# Patient Record
Sex: Female | Born: 1955
Health system: Southern US, Community
[De-identification: ages and names within clinical notes are randomized; demographics above are authoritative.]

## PROBLEM LIST (undated history)

## (undated) DIAGNOSIS — E785 Hyperlipidemia, unspecified: Secondary | ICD-10-CM

## (undated) DIAGNOSIS — R7303 Prediabetes: Secondary | ICD-10-CM

## (undated) DIAGNOSIS — E042 Nontoxic multinodular goiter: Secondary | ICD-10-CM

## (undated) DIAGNOSIS — F409 Phobic anxiety disorder, unspecified: Secondary | ICD-10-CM

## (undated) DIAGNOSIS — F17210 Nicotine dependence, cigarettes, uncomplicated: Secondary | ICD-10-CM

## (undated) DIAGNOSIS — I10 Essential (primary) hypertension: Secondary | ICD-10-CM

## (undated) HISTORY — DX: Nontoxic multinodular goiter: E04.2

## (undated) HISTORY — DX: Hyperlipidemia, unspecified: E78.5

## (undated) HISTORY — DX: Prediabetes: R73.03

## (undated) HISTORY — PX: COLON SURGERY: SHX602

## (undated) HISTORY — DX: Nicotine dependence, cigarettes, uncomplicated: F17.210

## (undated) HISTORY — PX: OTHER SURGICAL HISTORY: SHX169

---
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
## 1990-12-21 HISTORY — PX: TUBAL LIGATION: SHX77

## 1999-09-26 ENCOUNTER — Encounter: Payer: Self-pay | Admitting: Family Medicine

## 2000-02-27 ENCOUNTER — Ambulatory Visit (HOSPITAL_COMMUNITY): Admission: RE | Admit: 2000-02-27 | Discharge: 2000-02-27 | Payer: Self-pay | Admitting: Obstetrics and Gynecology

## 2000-02-27 ENCOUNTER — Encounter: Payer: Self-pay | Admitting: Obstetrics and Gynecology

## 2000-05-10 ENCOUNTER — Other Ambulatory Visit: Admission: RE | Admit: 2000-05-10 | Discharge: 2000-05-10 | Payer: Self-pay | Admitting: Obstetrics and Gynecology

## 2001-05-25 ENCOUNTER — Other Ambulatory Visit: Admission: RE | Admit: 2001-05-25 | Discharge: 2001-05-25 | Payer: Self-pay | Admitting: Obstetrics and Gynecology

## 2001-06-02 ENCOUNTER — Encounter: Payer: Self-pay | Admitting: Obstetrics and Gynecology

## 2001-06-02 ENCOUNTER — Ambulatory Visit (HOSPITAL_COMMUNITY): Admission: RE | Admit: 2001-06-02 | Discharge: 2001-06-02 | Payer: Self-pay | Admitting: Obstetrics and Gynecology

## 2002-07-14 ENCOUNTER — Other Ambulatory Visit: Admission: RE | Admit: 2002-07-14 | Discharge: 2002-07-14 | Payer: Self-pay | Admitting: Obstetrics and Gynecology

## 2004-07-21 ENCOUNTER — Other Ambulatory Visit: Admission: RE | Admit: 2004-07-21 | Discharge: 2004-07-21 | Payer: Self-pay | Admitting: Obstetrics and Gynecology

## 2004-08-22 ENCOUNTER — Encounter (INDEPENDENT_AMBULATORY_CARE_PROVIDER_SITE_OTHER): Payer: Self-pay | Admitting: Specialist

## 2004-08-22 ENCOUNTER — Ambulatory Visit (HOSPITAL_COMMUNITY): Admission: RE | Admit: 2004-08-22 | Discharge: 2004-08-22 | Payer: Self-pay | Admitting: Obstetrics and Gynecology

## 2004-12-21 HISTORY — PX: DILATION AND CURETTAGE OF UTERUS: SHX78

## 2005-07-13 ENCOUNTER — Other Ambulatory Visit: Admission: RE | Admit: 2005-07-13 | Discharge: 2005-07-13 | Payer: Self-pay | Admitting: Obstetrics and Gynecology

## 2005-07-21 ENCOUNTER — Ambulatory Visit (HOSPITAL_COMMUNITY): Admission: RE | Admit: 2005-07-21 | Discharge: 2005-07-21 | Payer: Self-pay | Admitting: Obstetrics and Gynecology

## 2010-02-04 ENCOUNTER — Encounter: Payer: Self-pay | Admitting: Physician Assistant

## 2010-08-01 ENCOUNTER — Encounter: Payer: Self-pay | Admitting: Physician Assistant

## 2010-08-01 ENCOUNTER — Telehealth: Payer: Self-pay | Admitting: Family Medicine

## 2010-08-01 ENCOUNTER — Ambulatory Visit: Payer: Self-pay | Admitting: Family Medicine

## 2010-08-01 DIAGNOSIS — N959 Unspecified menopausal and perimenopausal disorder: Secondary | ICD-10-CM | POA: Insufficient documentation

## 2010-08-01 DIAGNOSIS — E041 Nontoxic single thyroid nodule: Secondary | ICD-10-CM | POA: Insufficient documentation

## 2010-08-01 DIAGNOSIS — F17200 Nicotine dependence, unspecified, uncomplicated: Secondary | ICD-10-CM | POA: Insufficient documentation

## 2010-08-04 ENCOUNTER — Telehealth: Payer: Self-pay | Admitting: Physician Assistant

## 2010-08-08 ENCOUNTER — Ambulatory Visit (HOSPITAL_COMMUNITY): Admission: RE | Admit: 2010-08-08 | Discharge: 2010-08-08 | Payer: Self-pay | Admitting: Family Medicine

## 2010-08-11 ENCOUNTER — Ambulatory Visit (HOSPITAL_COMMUNITY): Admission: RE | Admit: 2010-08-11 | Discharge: 2010-08-11 | Payer: Self-pay | Admitting: Family Medicine

## 2010-08-11 LAB — CONVERTED CEMR LAB
Albumin: 4.4 g/dL (ref 3.5–5.2)
Basophils Absolute: 0 10*3/uL (ref 0.0–0.1)
Calcium: 9.8 mg/dL (ref 8.4–10.5)
Chloride: 101 meq/L (ref 96–112)
Cholesterol: 264 mg/dL — ABNORMAL HIGH (ref 0–200)
Creatinine, Ser: 0.78 mg/dL (ref 0.40–1.20)
Eosinophils Absolute: 0 10*3/uL (ref 0.0–0.7)
HCT: 38 % (ref 36.0–46.0)
Hgb A1c MFr Bld: 6.1 % — ABNORMAL HIGH (ref <5.7)
Lymphocytes Relative: 43 % (ref 12–46)
MCHC: 32.1 g/dL (ref 30.0–36.0)
Neutrophils Relative %: 48 % (ref 43–77)
Sodium: 141 meq/L (ref 135–145)
Total CHOL/HDL Ratio: 8.8
Total Protein: 7.1 g/dL (ref 6.0–8.3)
Triglycerides: 377 mg/dL — ABNORMAL HIGH (ref <150)
WBC: 5.1 10*3/uL (ref 4.0–10.5)

## 2010-08-14 ENCOUNTER — Ambulatory Visit: Payer: Self-pay | Admitting: Otolaryngology

## 2010-08-14 ENCOUNTER — Encounter: Payer: Self-pay | Admitting: Physician Assistant

## 2010-08-19 ENCOUNTER — Ambulatory Visit (HOSPITAL_COMMUNITY): Admission: RE | Admit: 2010-08-19 | Discharge: 2010-08-19 | Payer: Self-pay | Admitting: Otolaryngology

## 2010-09-04 ENCOUNTER — Encounter: Payer: Self-pay | Admitting: Physician Assistant

## 2010-09-04 ENCOUNTER — Ambulatory Visit: Payer: Self-pay | Admitting: Otolaryngology

## 2010-09-25 ENCOUNTER — Other Ambulatory Visit: Admission: RE | Admit: 2010-09-25 | Discharge: 2010-09-25 | Payer: Self-pay | Admitting: Family Medicine

## 2010-09-25 ENCOUNTER — Ambulatory Visit: Payer: Self-pay | Admitting: Family Medicine

## 2010-09-25 DIAGNOSIS — E785 Hyperlipidemia, unspecified: Secondary | ICD-10-CM | POA: Insufficient documentation

## 2010-09-25 DIAGNOSIS — R7303 Prediabetes: Secondary | ICD-10-CM | POA: Insufficient documentation

## 2010-09-25 LAB — CONVERTED CEMR LAB: OCCULT 1: NEGATIVE

## 2010-10-14 ENCOUNTER — Encounter: Payer: Self-pay | Admitting: Physician Assistant

## 2011-01-22 NOTE — Progress Notes (Signed)
Summary: TEST  Phone Note Call from Patient   Summary of Call: WANTS TO DO BOTH TEST THE SAME DAY IN THE AFTERNOON ANYTIME AFTER LUNCH Initial call taken by: Lind Guest,  August 01, 2010 10:19 AM  Follow-up for Phone Call        pt has appts on 08/05/2010 1:00.  left message for pt to call back.  Follow-up by: Rudene Anda,  August 01, 2010 3:57 PM

## 2011-01-22 NOTE — Letter (Signed)
Summary: DR. Suszanne Conners  DR. TEOH   Imported By: Lind Guest 08/20/2010 11:22:14  _____________________________________________________________________  External Attachment:    Type:   Image     Comment:   External Document

## 2011-01-22 NOTE — Assessment & Plan Note (Signed)
Summary: PHY   Vital Signs:  Patient profile:   55 year old female Menstrual status:  perimenopausal Height:      63.5 inches Weight:      142.50 pounds BMI:     24.94 O2 Sat:      99 % Pulse rate:   71 / minute Resp:     16 per minute BP sitting:   122 / 68  (left arm) Cuff size:   regular  Vitals Entered By: Mauricia Area CMA CC: CPE   CC:  CPE.  History of Present Illness: Pt is being seen today for a physical.  Last physical 5-6 yrs ago. Overall feeling well and no current complaints or concers. Perimenopausal. Last Mamm 8-11.  + SBEs Colonoscopy - no prev. Last eye exam approx 2 yrs ago. + routine dental care Last TD uncertain. Pt declines flu vaccine.  Labs 08-06-10 showed Hyperlipidemia, and Pre-diabetes. Due for f/u labs next mos.    Allergies: No Known Drug Allergies  Past History:  Past medical, surgical, family and social histories (including risk factors) reviewed, and no changes noted (except as noted below).  Past Medical History: Benign Rt thyroid nodule  Past Surgical History: D&C 2006 Tubal ligation 1992 FNA thyroid 2011 - benign  Family History: Reviewed history from 08/01/2010 and no changes required. Mother living-HTN, CAD, Hyperlipidemia, AAA Father living- HTN One sister living- presumed healthy  Social History: Reviewed history from 08/01/2010 and no changes required. 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 Eyes:  Denies blurring and double vision. ENT:  Denies decreased hearing, earache, nasal congestion, postnasal drainage, ringing in ears, sinus pressure, and sore throat. CV:  Denies chest pain or discomfort, lightheadness, palpitations, and swelling of feet. Resp:  Denies cough and shortness of breath. GI:  Denies abdominal pain, bloody stools,  change in bowel habits, constipation, dark tarry stools, diarrhea, indigestion, nausea, and vomiting. GU:  Denies abnormal vaginal bleeding, discharge, dysuria, incontinence, and urinary frequency. MS:  Complains of joint pain; denies joint redness, joint swelling, low back pain, and mid back pain; BILAT KNEE PAIN X YRS, UNCHANGED. Derm:  Denies rash. Neuro:  Denies headaches, numbness, and tingling. Psych:  Denies anxiety and depression.  Physical Exam  General:  Well-developed,well-nourished,in no acute distress; alert,appropriate and cooperative throughout examination Head:  Normocephalic and atraumatic without obvious abnormalities. No apparent alopecia or balding. Eyes:  pupils equal, pupils round, and pupils reactive to light.   Ears:  External ear exam shows no significant lesions or deformities.  Otoscopic examination reveals clear canals, tympanic membranes are intact bilaterally without bulging, retraction, inflammation or discharge. Hearing is grossly normal bilaterally. Nose:  External nasal examination shows no deformity or inflammation. Nasal mucosa are pink and moist without lesions or exudates. Mouth:  Oral mucosa and oropharynx without lesions or exudates.  Teeth in good repair. Neck:  No deformities, masses, or tenderness noted.  Rt thyroid nodule, nontender Chest Wall:  no deformities and no mass.   Breasts:  No mass, nodules, thickening, tenderness, bulging, retraction, inflamation, nipple discharge or skin changes noted.   Lungs:  Normal respiratory effort, chest expands symmetrically. Lungs are clear to auscultation, no crackles or wheezes. Heart:  Normal rate and regular rhythm. S1 and S2 normal without gallop, murmur, click, rub or other extra sounds. Abdomen:  Bowel sounds positive,abdomen soft and  non-tender without masses, organomegaly or hernias noted. Rectal:  No external abnormalities noted. Normal sphincter tone. No rectal masses or tenderness. Genitalia:  Normal  introitus for age, no external lesions, no vaginal discharge, mucosa pink and moist, no vaginal or cervical lesions, no vaginal atrophy, no friaility or hemorrhage, normal uterus size and position, no adnexal masses or tenderness Pulses:  R posterior tibial normal, R dorsalis pedis normal, L posterior tibial normal, and L dorsalis pedis normal.   Extremities:  No clubbing, cyanosis, edema, or deformity noted with normal full range of motion of all joints.   Neurologic:  alert & oriented X3, sensation intact to light touch, gait normal, and DTRs symmetrical and normal.   Skin:  Intact without suspicious lesions or rashes Cervical Nodes:  No lymphadenopathy noted Axillary Nodes:  No palpable lymphadenopathy Psych:  Cognition and judgment appear intact. Alert and cooperative with normal attention span and concentration. No apparent delusions, illusions, hallucinations   Impression & Recommendations:  Problem # 1:  PREVENTIVE HEALTH CARE (ICD-V70.0) Assessment Comment Only Pt declined referral for screening colonoscopy.  Problem # 2:  PRE-DIABETES (ICD-790.29) Assessment: Comment Only  Orders: T- Hemoglobin A1C ([id number]) T-Comprehensive Metabolic Panel ([id number])  Labs Reviewed: Creat: 0.78 (08/06/2010)     Problem # 3:  HYPERLIPIDEMIA (ICD-272.4) Assessment: Comment Only  Orders: T-Lipid Profile [id number]) T-Comprehensive Metabolic Panel [phone])  Labs Reviewed: SGOT: 21 (08/06/2010)   SGPT: 16 (08/06/2010)   HDL:30 (08/06/2010)  LDL:159 (08/06/2010)  Chol:264 (08/06/2010)  Trig:377 (08/06/2010)  Problem # 4:  THYROID NODULE, RIGHT (ICD-241.0) Assessment: Comment Only  Problem # 5:  PERIMENOPAUSAL SYNDROME (ICD-627.9) Assessment: Comment Only  Other Orders: Pap Smear (28413) Hemoccult Guaiac-1 spec.(in office) (82270) Tdap => 71yrs IM (24401) Admin 1st Vaccine (02725) Admin 1st Vaccine [facility]) [id number])  Patient Instructions: 1)  Please schedule a  follow-up appointment in 4 months. 2)  Have blood work drawn fasting in mid November. 3)  You have received a Tetnus vaccine today (Tdap). 4)  Tobacco is very bad for your health and your loved ones! You Should stop smoking!. 5)  Stop Smoking Tips: Choose a Quit date. Cut down before the Quit date. decide what you will do as a substitute when you feel the urge to smoke(gum,toothpick,exercise). 6)  If you change your mind about a referral for a screening colonscopy please notify the office.   Laboratory Results    Stool - Occult Blood Hemmoccult #1: negative Date: 09/25/2010     Tetanus/Td Vaccine    Vaccine Type: Tdap    Site: left deltoid    Mfr: boostrix    Dose: 0.5 ml    Route: IM    Given by: Everitt Amber LPN    Exp. Date: 10/09/2012    Lot #: [id number]

## 2011-01-22 NOTE — Assessment & Plan Note (Signed)
Summary: new patient- room 2   Vital Signs:  Patient profile:   55 year old female Menstrual status:  perimenopausal Height:      63.5 inches Weight:      140.75 pounds BMI:     24.63 O2 Sat:      100 % on Room air Pulse rate:   83 / minute Resp:     16 per minute BP sitting:   140 / 78  (left arm)  Vitals Entered By: Adella Hare LPN (August 01, 2010 9:37 AM) CC: new patient Is Patient Diabetic? No Pain Assessment Patient in pain? no          Menstrual Status perimenopausal   CC:  new patient.  History of Present Illness: New pt here to establish care with new PCP.  No complaints or concerns. Is a smoker and knows she should quit, but is not ready or interested to at this time.  Has been going thru menopause x 2 yrs.  Her last nl menses was approx 1 yr ago but has light spotting every few mos still.  Has been having hot flashes x approx 2 yrs.  Sometimes worse than others.  Last physical/pap 5-6 yrs ago.  Mamm too. No prev colonoscopy. Labs done thru employer this last spring.  Pt has copy at home.         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 CV:  Denies chest pain or discomfort, lightheadness, and palpitations. Resp:  Denies cough and shortness of breath. GI:  Denies abdominal pain, change in bowel habits, indigestion, loss of appetite, nausea, and vomiting. GU:  Complains of  abnormal vaginal bleeding and incontinence; denies dysuria and urinary frequency; SUI. Psych:  Denies anxiety and depression.  Physical Exam  General:  Well-developed,well-nourished,in no acute distress; alert,appropriate and cooperative throughout examination Head:  Normocephalic and atraumatic without obvious abnormalities. No apparent alopecia or balding. Ears:  External ear exam shows no significant lesions or deformities.  Otoscopic examination reveals clear canals, tympanic membranes are intact bilaterally without bulging, retraction, inflammation or discharge. Hearing is grossly normal bilaterally. Nose:  External nasal examination shows no deformity or inflammation. Nasal mucosa are pink and moist without lesions or exudates. Mouth:  Oral mucosa and oropharynx without lesions or exudates.  Teeth in good repair. Neck:  No deformities, masses, or tenderness noted.  Palp approx 1 cm nodule Rt lobe of thyroid.  Nontender Lungs:  Normal respiratory effort, chest expands symmetrically. Lungs are clear to auscultation, no crackles or wheezes. Heart:  Normal rate and regular rhythm. S1 and S2 normal without gallop, murmur, click, rub or other extra sounds. Cervical Nodes:  No lymphadenopathy noted Psych:  Cognition and judgment appear intact. Alert and cooperative with normal attention span and concentration. No apparent delusions, illusions, hallucinations   Impression & Recommendations:  Problem # 1:  PERIMENOPAUSAL SYNDROME (ICD-627.9) Assessment Comment Only Discussed measures to help with hot flashes.  Briefly discussed HRT and risks. Pt not interested  in rxs anyway.  Problem # 2:  THYROID NODULE, RIGHT (ICD-241.0) Assessment: New  Orders: T-TSH ([id number]) T-T4, Free [phone]) Miscellaneous Other Radiology (Misc Other Rad)  Problem # 3:  NICOTINE ADDICTION (ICD-305.1) Assessment: Comment Only Pt aware of risks.  Is not interested in quitting at this time.  Other  Orders: T-Mammography Bilateral Screening (30865)  Patient Instructions: 1)  Schedule physical/pap in 1-2 mos. 2)  I have ordered an ultrasound of your thyroid gland and lab work. 3)  I have ordered a mammogram for you. 4)  Please fax or drop off a copy of your recent lab work. 5)  Tobacco is very bad for your health and your loved ones! You Should stop smoking!. 6)  Stop Smoking Tips: Choose a Quit date. Cut down before the Quit date. decide what you will do as a substitute when you feel the urge to smoke(gum,toothpick,exercise).

## 2011-01-22 NOTE — Progress Notes (Signed)
  Phone Note Outgoing Call   Summary of Call: Advise pt that I reviewed the lab results she faxed me.  Her cholesterol levess were quite high, and her blood sugar was also mildly elevated.  I need to have these repeated.  Her thyroid labs were nl she had done last week. Order CBC, Comp panel, Lipids, HgbA1C. Dx:  Impaired fasting glucose, Hyperlipidemia, Diaphoresis for the CBC Initial call taken by: Esperanza Sheets PA,  August 04, 2010 8:38 AM  Follow-up for Phone Call        patient aware, order sent to lab Follow-up by: Adella Hare LPN,  August 04, 2010 11:56 AM

## 2011-01-23 NOTE — Letter (Signed)
Summary: Letter  Letter   Imported By: Lind Guest 10/15/2010 14:30:54  _____________________________________________________________________  External Attachment:    Type:   Image     Comment:   External Document

## 2011-01-23 NOTE — Letter (Signed)
Summary: ENT  ENT   Imported By: Lind Guest 09/09/2010 08:35:04  _____________________________________________________________________  External Attachment:    Type:   Image     Comment:   External Document

## 2011-01-26 ENCOUNTER — Ambulatory Visit (INDEPENDENT_AMBULATORY_CARE_PROVIDER_SITE_OTHER): Payer: 59 | Admitting: Family Medicine

## 2011-01-26 ENCOUNTER — Encounter: Payer: Self-pay | Admitting: Family Medicine

## 2011-01-26 DIAGNOSIS — E785 Hyperlipidemia, unspecified: Secondary | ICD-10-CM

## 2011-01-26 DIAGNOSIS — E041 Nontoxic single thyroid nodule: Secondary | ICD-10-CM

## 2011-01-26 DIAGNOSIS — R7301 Impaired fasting glucose: Secondary | ICD-10-CM | POA: Insufficient documentation

## 2011-01-26 DIAGNOSIS — F17200 Nicotine dependence, unspecified, uncomplicated: Secondary | ICD-10-CM

## 2011-01-27 DIAGNOSIS — E663 Overweight: Secondary | ICD-10-CM | POA: Insufficient documentation

## 2011-01-29 LAB — CONVERTED CEMR LAB
Cholesterol: 249 mg/dL — ABNORMAL HIGH (ref 0–200)
Glucose, Bld: 106 mg/dL — ABNORMAL HIGH (ref 70–99)
LDL Cholesterol: 181 mg/dL — ABNORMAL HIGH (ref 0–99)
Total CHOL/HDL Ratio: 8.3

## 2011-01-30 ENCOUNTER — Encounter: Payer: Self-pay | Admitting: Family Medicine

## 2011-02-05 NOTE — Letter (Signed)
Summary: gynecologic cytology report  gynecologic cytology report   Imported By: Lind Guest 01/30/2011 15:02:55  _____________________________________________________________________  External Attachment:    Type:   Image     Comment:   External Document

## 2011-02-05 NOTE — Letter (Signed)
Summary: rewards for health program  rewards for health program   Imported By: Lind Guest 01/30/2011 15:01:33  _____________________________________________________________________  External Attachment:    Type:   Image     Comment:   External Document

## 2011-02-05 NOTE — Assessment & Plan Note (Signed)
Summary: office visit   Vital Signs:  Patient profile:   55 year old female Menstrual status:  perimenopausal Height:      63.5 inches Weight:      137 pounds BMI:     23.97 O2 Sat:      99 % Pulse rate:   80 / minute Pulse rhythm:   regular Resp:     16 per minute BP sitting:   118 / 80  (left arm) Cuff size:   regular  Vitals Entered By: Everitt Amber LPN (January 26, 2011 3:50 PM) 127CC: Follow up chronic problems   Primary Care Provider:  Kerri Perches, MD  CC:  Follow up chronic problems.  History of Present Illness: Reports  thatshe is well. She has paperwork from her insurance company to be completed. She is refusing a colonscopy at this timer. She states her ciggaretes are for stress relief primarily, however she is willing to slowly cut back. Denies recent fever or chills. Denies sinus pressure, nasal congestion , ear pain or sore throat. Denies chest congestion, or cough productive of sputum. Denies chest pain, palpitations, PND, orthopnea or leg swelling. Denies abdominal pain, nausea, vomitting, diarrhea or constipation. Denies change in bowel movements or bloody stool. Denies dysuria , frequency, incontinence or hesitancy. Denies  joint pain, swelling, or reduced mobility. Denies headaches, vertigo, seizures. Denies depression, anxiety or insomnia. Denies  rash, lesions, or itch. Exercises on avg 5 days per week with her dogs, enjoys it and it affords stress relief. She has altered her diet and lost weight.     Preventive Screening-Counseling & Management  Alcohol-Tobacco     Smoking Cessation Counseling: yes  Current Medications (verified): 1)  None  Allergies (verified): No Known Drug Allergies  Past History:  Past medical, surgical, family and social histories (including risk factors) reviewed for relevance to current acute and chronic problems.  Past Medical History: Benign Rt thyroid nodule Prediabetes hyperlipidemia  Past  Surgical History: Reviewed history from 09/25/2010 and no changes required. D&C 2006 Tubal ligation 1992 FNA thyroid 2011 - benign  Family History: Reviewed history from 09/25/2010 and no changes required. Mother living-HTN, CAD, Hyperlipidemia, AAA Father living- HTN One sister living- presumed healthy  Social History: Reviewed history from 08/01/2010 and no changes required. 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 Psych:  Complains of anxiety; stress, esp the job. Endo:  Denies cold intolerance, excessive hunger, excessive thirst, and excessive urination; from last labs pt is prediabetic. Heme:  Denies abnormal bruising and bleeding. Allergy:  Denies hives or rash, itching eyes, and persistent infections.  Physical Exam  General:  Well-developed,well-nourished,in no acute distress; alert,appropriate and cooperative throughout examination HEENT: No facial asymmetry,  EOMI, No sinus tenderness, oropharynx  pink and moist.   Chest: Clear to auscultation bilaterally.  CVS: S1, S2, No murmurs, No S3.   Abd: Soft, Nontender.  MS: Adequate ROM spine, hips, shoulders and knees.  Ext: No edema.   CNS: CN 2-12 intact, power tone and sensation normal throughout.   Skin: Intact, no visible lesions or rashes.  Psych: Good eye contact, normal affect.  Memory intact, not anxious or depressed appearing.    Impression & Recommendations:  Problem # 1:  IMPAIRED FASTING GLUCOSE (ICD-790.21)  Assessment Comment Only  Orders: T- Hemoglobin A1C ([id number]) T-Glucose, Blood (30865-78469) T- Hemoglobin A1C (62952-84132) Pt advised to reduce carbohydrate intake, espescially sweets, and to start regular physical activity, at least 30 minutes 5 days weekly, to  enable weight loss, and reduce the risk of becoming diabetic   Problem # 2:  HYPERLIPIDEMIA (ICD-272.4) Assessment: Comment Only  Orders: T-Lipid Profile ([id number])  Labs Reviewed: SGOT: 21 (08/06/2010)   SGPT: 16 (08/06/2010)   HDL:30 (08/06/2010)  LDL:159 (08/06/2010)  Chol:264 (08/06/2010)  Trig:377 (08/06/2010) Low fat diet discussed and encouraged, and literature also given  Problem # 3:  NICOTINE ADDICTION (ICD-305.1) Assessment: Unchanged  Encouraged smoking cessation and discussed different methods for smoking cessation. Current 1PPd  Problem # 4:  OVERWEIGHT (ICD-278.02) Assessment: Improved  Ht: 63.5 (01/26/2011)   Wt: 137 (01/26/2011)   BMI: 23.97 (01/26/2011) therapeutic lifestyle change discussed and encouraged, pt applauded on weight loss  Patient Instructions: 1)  Follow up appointment in 5.24months 2)  Tobacco is very bad for your health and your loved ones! You Should stop smoking!. 3)  Stop Smoking Tips: Choose a Quit date. Cut down before the Quit date. decide what you will do as a substitute when you feel the urge to smoke(gum,toothpick,exercise). 4)  It is important that you exercise regularly at least 40 minutes 7 times a week. If you develop chest pain, have severe difficulty breathing, or feel very tired , stop exercising immediately and seek medical attention. 5)  You need to lose weight. Consider a lower calorie diet and regular exercise.  6)  Lipid Panel prior to visit, ICD-9:  fasting asap 7)  HbgA1C prior to visit, ICD-9: 8)  Fasting blood sugar 9)  Lipid Panel prior to visit, ICD-9: 10)  HbgA1C prior to visit, ICD-9:  fasting in 5.5 months 11)  Schedule a colonoscopy/sigmoidoscopy to help detect colon cancer.   Orders Added: 1)  Est. Patient Level IV [66440] 2)  T-Lipid Profile [80061-22930] 3)  T- Hemoglobin A1C [83036-23375] 4)  T-Glucose, Blood [82947-23040] 5)  T-Lipid Profile [80061-22930] 6)  T- Hemoglobin A1C [83036-23375]

## 2011-05-08 NOTE — H&P (Signed)
NAME:  Andrea Simon, Andrea Simon NO.:  [id number]   MEDICAL RECORD NO.:  [id number]                   PATIENT TYPE:  AMB   LOCATION:  [facility]                                  FACILITY:  [facility]   PHYSICIAN:  Janine Limbo, M.D.            DATE OF BIRTH:  01-31-1956   DATE OF ADMISSION:  08/22/2004  DATE OF DISCHARGE:                                HISTORY & PHYSICAL   HISTORY OF PRESENT ILLNESS:  [name] is a 55 year old female, para 1-0-  0-1, who presents for hysteroscopy with dilatation and curettage because of  irregular cycles.  A hydrosonogram was performed and the patient was found  to have an endometrial polyp or an endometrial fibroid.   OBSTETRICAL HISTORY:  The patient has had 1 term vaginal delivery.   PAST MEDICAL HISTORY:  The patient has had a bilateral tubal ligation.  The  patient denies hypertension and diabetes.   DRUG ALLERGIES:  The patient reports that she is allergic to CODEINE and  this causes nausea.   SOCIAL HISTORY:  The patient smokes 1 pack of cigarettes each day.  She  denied alcohol use and other recreational drug uses.   REVIEW OF SYSTEMS:  Review of systems noncontributory.   FAMILY HISTORY:  Family history is noncontributory.   PHYSICAL EXAMINATION:  GENERAL:  Weight is 119 pounds.  HEENT:  Within normal limits.  CHEST:  Chest is clear.  HEART:  Regular rate and rhythm.  BREASTS:  Her breasts are without masses.  ABDOMEN:  Her abdomen is soft and nontender.  EXTREMITIES:  Extremities within normal limits.  NEUROLOGIC:  Exam is grossly normal.  PELVIC:  External genitalia is normal.  Vagina is normal.  Cervix is  nontender.  Uterus is 8 weeks' size and slightly irregular.  Adnexa:  No  masses.  Rectovaginal exam confirms.   LABORATORY VALUES:  Ultrasound shows a 10.3 x 6.6-cm uterus.  The  endometrium measures 1.22 cm.  The ovaries appear normal.  The patient was  found to have a 3.9-cm lesion within the endometrial  cavity.   GC negative.  Chlamydia negative.  Hemoglobin 6.7.  TSH 1.98.  FSH 107.9.  Prolactin is 12.4.   ASSESSMENT:  1.  Endometrial polyp.  2.  Irregular bleeding.  3.  Severe anemia.   PLAN:  The patient will undergo hysteroscopy with resection of the  intrauterine polyp (fibroid).  She understands the indications for her  procedure and she accepts the associated risks.                                               Janine Limbo, M.D.    AVS/MEDQ  D:  08/21/2004  T:  08/21/2004  Job:  [id number]

## 2011-05-08 NOTE — Op Note (Signed)
NAME:  Andrea Simon, Andrea Simon                    ACCOUNT NO.:  [id number]   MEDICAL RECORD NO.:  [id number]                   PATIENT TYPE:  AMB   LOCATION:  SDC                                  FACILITY:  [facility]   PHYSICIAN:  Janine Limbo, M.D.            DATE OF BIRTH:  1956-09-27   DATE OF PROCEDURE:  08/22/2004  DATE OF DISCHARGE:                                 OPERATIVE REPORT   PREOPERATIVE DIAGNOSES:  1.  Irregular uterine bleeding.  2.  Fibroid uterus.  3.  Endometrial polyps.  4.  Anemia (hemoglobin 7.4).   POSTOPERATIVE DIAGNOSES:  1.  Irregular uterine bleeding.  2.  Fibroid uterus.  3.  Endometrial polyps.  4.  Anemia (hemoglobin 7.4).   PROCEDURE:  1.  Hysteroscopy with resection of fibroids and resection of polyps.  2.  Dilatation and curettage.   SURGEON:  Dr. Leonard Schwartz.   ANESTHETIC:  Monitored anesthetic control and paracervical block using 0.5%  Marcaine with epinephrine.   DISPOSITION:  [name] is a 55 year old female para 1-0-0-1 who  presents with the above-mentioned diagnosis.  The patient understands the  indications for her procedure and she accepts the associated risks of, but  not limited to, anesthetic complications, bleeding, infections, and possible  damage to the surrounding organs.   FINDINGS:  The patient was noted to have a 12-week size multifibroid uterus.  The uterus sounded to 11 cm.  The patient was found to have a 3 cm  endometrial polyp from the left fundus of the uterus.  There was a 2 cm  submucosal fibroid from the right fundus.  There were no areas within the  endometrial cavity that appeared malignant.  No adnexal masses were  appreciated on examination under anesthesia.   PROCEDURE:  The patient was taken to the operating room where she was given  medication through her IV line.  [name] abdomen, perineum, and vagina  were prepped with multiple layers of Betadine.  The bladder was drained of  urine.  The patient was sterilely draped.  Examination under anesthesia was  performed.  A paracervical block was placed using 10 mL of 0.5% Marcaine  with epinephrine.  An endocervical curettage was then obtained.  The uterus  sounded to 11 cm.  The cervix was dilated.  The diagnostic hysteroscope was  inserted and findings are noted above.  Pictures were taken of the patient's  endometrial cavity.  The hysteroscope was removed and the cervix was dilated  further.  The operative hysteroscope was then inserted.  A single loop  resection apparatus was then used to resect the endometrial polyp from the  left upper uterus.  We then used the same apparatus to slowly resect the  submucosal fibroid from the right fundus.  Care was taken not to dissect too  deeply within the endometrium.  We then removed the hysteroscope with the  resection apparatus and the cavity was explored using Brynda Greathouse  stone forceps.  The cavity was then gently curetted until the cavity was felt to be  completely clean.  The hysteroscope was then reinserted and the cavity was  indeed felt to be clean.  Another picture was taken.  All instruments were  then removed.  Hemostasis was noted to be adequate.  The estimated blood  loss was 10 mL.  The estimated fluid deficit was 110 mL.  The examination  was repeated and the uterus was noted to be firm.  The patient was then  returned to the supine position and taken to the recovery room in stable  condition.  Sponge, needle, and instrument counts were correct on two  occasions.  The patient tolerated her procedure well.   FOLLOW-UP INSTRUCTIONS:  The patient was given a prescription for Darvocet-N  50 and she will take one or two tablets q.4h. as needed for pain.  She will  return to see Dr. Stefano Gaul in 2-3 weeks for follow-up examination.  She was  given a copy of the postoperative instruction sheet as prepared by the  [facility] for patients who have  undergone dilatation  and curettage.                                               Janine Limbo, M.D.    AVS/MEDQ  D:  08/22/2004  T:  08/23/2004  Job:  [id number]

## 2011-07-06 ENCOUNTER — Encounter: Payer: Self-pay | Admitting: Physician Assistant

## 2011-07-13 ENCOUNTER — Other Ambulatory Visit: Payer: Self-pay | Admitting: Family Medicine

## 2011-07-14 ENCOUNTER — Encounter: Payer: Self-pay | Admitting: Family Medicine

## 2011-07-14 ENCOUNTER — Ambulatory Visit (INDEPENDENT_AMBULATORY_CARE_PROVIDER_SITE_OTHER): Payer: 59 | Admitting: Family Medicine

## 2011-07-14 VITALS — BP 130/80 | HR 72 | Resp 16 | Ht 63.5 in | Wt 130.0 lb

## 2011-07-14 DIAGNOSIS — R5381 Other malaise: Secondary | ICD-10-CM

## 2011-07-14 DIAGNOSIS — R5383 Other fatigue: Secondary | ICD-10-CM

## 2011-07-14 DIAGNOSIS — E785 Hyperlipidemia, unspecified: Secondary | ICD-10-CM

## 2011-07-14 DIAGNOSIS — R7309 Other abnormal glucose: Secondary | ICD-10-CM

## 2011-07-14 DIAGNOSIS — F17200 Nicotine dependence, unspecified, uncomplicated: Secondary | ICD-10-CM

## 2011-07-14 LAB — LIPID PANEL
HDL: 36 mg/dL — ABNORMAL LOW (ref >39)
LDL Cholesterol: 165 mg/dL — ABNORMAL HIGH (ref 0–99)
Total CHOL/HDL Ratio: 6.8 Ratio
Triglycerides: 214 mg/dL — ABNORMAL HIGH (ref <150)

## 2011-07-14 LAB — HEMOGLOBIN A1C: Mean Plasma Glucose: 140 mg/dL — ABNORMAL HIGH (ref <117)

## 2011-07-14 NOTE — Patient Instructions (Addendum)
  F/U in 4 months.  Fasting lipid, hepatic, chem 7, hBAC  In 4 months   You will start med for high cholesterol once I check your liver enz  You are being referred for individual teaching for diabetes  pls commit to regular daily exercise , this helps blood sugar and cholesterol.  Pls stop drinking sweetened drinks

## 2011-07-15 LAB — HEPATIC FUNCTION PANEL
AST: 17 U/L (ref 0–37)
Indirect Bilirubin: 0.7 mg/dL (ref 0.0–0.9)
Total Bilirubin: 0.8 mg/dL (ref 0.3–1.2)

## 2011-07-15 LAB — BASIC METABOLIC PANEL
Chloride: 103 mEq/L (ref 96–112)
Glucose, Bld: 103 mg/dL — ABNORMAL HIGH (ref 70–99)

## 2011-07-15 MED ORDER — ROSUVASTATIN CALCIUM 10 MG PO TABS: 10.0000 mg | ORAL_TABLET | Freq: Every day | ORAL | Status: DC

## 2011-08-02 ENCOUNTER — Encounter: Payer: Self-pay | Admitting: Family Medicine

## 2011-08-02 NOTE — Progress Notes (Signed)
  Subjective:    Patient ID: Andrea Simon, female    DOB: May 26, 1956, 55 y.o.   MRN: [id number]  HPI The PT is here for follow up and re-evaluation of chronic medical conditions, medication management and review of any available recent lab and radiology data.  Preventive health is updated, specifically  Cancer screening and Immunization.   Questions or concerns regarding consultations or procedures which the PT has had in the interim are  addressed. The PT denies any adverse reactions to current medications since the last visit.  There are no new concerns.  There are no specific complaints       Review of Systems Denies recent fever or chills. Denies sinus pressure, nasal congestion, ear pain or sore throat. Denies chest congestion, productive cough or wheezing. Denies chest pains, palpitations and leg swelling Denies abdominal pain, nausea, vomiting,diarrhea or constipation.   Denies dysuria, frequency, hesitancy or incontinence. Denies joint pain, swelling and limitation in mobility. Denies headaches, seizures, numbness, or tingling. Denies depression, anxiety or insomnia. Denies skin break down or rash.        Objective:   Physical Exam Patient alert and oriented and in no cardiopulmonary distress.  HEENT: No facial asymmetry, EOMI, no sinus tenderness,  oropharynx pink and moist.  Neck supple no adenopathy.  Chest: Clear to auscultation bilaterally.  CVS: S1, S2 no murmurs, no S3.  ABD: Soft non tender. Bowel sounds normal.  Ext: No edema  MS: Adequate ROM spine, shoulders, hips and knees.  Skin: Intact, no ulcerations or rash noted.  Psych: Good eye contact, normal affect. Memory intact not anxious or depressed appearing.  CNS: CN 2-12 intact, power, tone and sensation normal throughout.        Assessment & Plan:

## 2011-08-02 NOTE — Assessment & Plan Note (Signed)
Uncontrolled, med adherence stressed and low fat diet

## 2011-08-02 NOTE — Assessment & Plan Note (Signed)
Deterioration in hBA1C to diabetic level, aggressive and consistent lifestyle change needed this is discussed with the pt

## 2011-08-02 NOTE — Assessment & Plan Note (Signed)
Unchnged, counseled to quit , no date set

## 2011-09-03 ENCOUNTER — Ambulatory Visit (INDEPENDENT_AMBULATORY_CARE_PROVIDER_SITE_OTHER): Payer: 59 | Admitting: Otolaryngology

## 2011-09-07 ENCOUNTER — Other Ambulatory Visit: Payer: Self-pay | Admitting: Family Medicine

## 2011-09-07 DIAGNOSIS — Z139 Encounter for screening, unspecified: Secondary | ICD-10-CM

## 2011-09-14 ENCOUNTER — Ambulatory Visit (HOSPITAL_COMMUNITY)
Admission: RE | Admit: 2011-09-14 | Discharge: 2011-09-14 | Disposition: A | Discharge status: Home / Self Care | Payer: 59 | Source: Ambulatory Visit | Attending: Family Medicine | Admitting: Family Medicine

## 2011-09-14 DIAGNOSIS — Z139 Encounter for screening, unspecified: Secondary | ICD-10-CM

## 2011-09-14 DIAGNOSIS — Z1231 Encounter for screening mammogram for malignant neoplasm of breast: Secondary | ICD-10-CM | POA: Insufficient documentation

## 2011-09-17 ENCOUNTER — Other Ambulatory Visit: Payer: Self-pay | Admitting: Family Medicine

## 2011-09-17 MED ORDER — PRAVASTATIN SODIUM 40 MG PO TABS: 40.0000 mg | ORAL_TABLET | Freq: Every evening | ORAL | Status: DC

## 2011-09-17 NOTE — Telephone Encounter (Signed)
pls change to pravstatin 40mg  1 at night , and erx #30 refill4 and let her know, d/c crestor pls also

## 2011-09-17 NOTE — Telephone Encounter (Signed)
Sent as requested.

## 2011-09-17 NOTE — Telephone Encounter (Signed)
Crestor is no longer covered. Pravastatin 10, 20, 40 and Lovastatin 10, 20 are and they are at [facility] for $4. Wants it changed to one of those if possible

## 2011-09-21 ENCOUNTER — Ambulatory Visit (INDEPENDENT_AMBULATORY_CARE_PROVIDER_SITE_OTHER): Payer: 59 | Admitting: Family Medicine

## 2011-09-21 ENCOUNTER — Ambulatory Visit (HOSPITAL_COMMUNITY)
Admission: RE | Admit: 2011-09-21 | Discharge: 2011-09-21 | Disposition: A | Discharge status: Home / Self Care | Payer: 59 | Source: Ambulatory Visit | Attending: Family Medicine | Admitting: Family Medicine

## 2011-09-21 ENCOUNTER — Encounter: Payer: Self-pay | Admitting: Family Medicine

## 2011-09-21 VITALS — BP 118/76 | HR 84 | Resp 16 | Ht 63.5 in | Wt 138.0 lb

## 2011-09-21 DIAGNOSIS — M549 Dorsalgia, unspecified: Secondary | ICD-10-CM

## 2011-09-21 DIAGNOSIS — M545 Low back pain, unspecified: Secondary | ICD-10-CM | POA: Insufficient documentation

## 2011-09-21 DIAGNOSIS — M25559 Pain in unspecified hip: Secondary | ICD-10-CM

## 2011-09-21 MED ORDER — TRAMADOL HCL 50 MG PO TABS: 50.0000 mg | ORAL_TABLET | Freq: Two times a day (BID) | ORAL | Status: DC | PRN

## 2011-09-21 MED ORDER — CYCLOBENZAPRINE HCL 10 MG PO TABS: 10.0000 mg | ORAL_TABLET | Freq: Every evening | ORAL | Status: DC | PRN

## 2011-09-21 NOTE — Progress Notes (Signed)
  Subjective:    Patient ID: Andrea Simon, female    DOB: [birth year], 55 y.o.   MRN: [id number]  HPI Left hip pain with radiation down leg, feels more like soreness in left hip and lower buttocks, feels stretching sensation in back of calf. Occ sharp pain down leg with numbness and tingling. Denies injury, no change in bowel or bladder, feels weaker on left side. Has been using NSAIDS otc.       Review of Systems     Objective:   Physical Exam GEN-NAD, alert and oriented x 3 Back- spine non tender, no spasm of paraspinals, neg SLR but pain with manipulation, pain with flexion and extension in left buttock w./radiation Hip- discomoft with IR of left hip, normal ER, mild TTP in left buttock Neuro- strength equal bilat, sensation in tact grossly, DTR symmetric bilat, antalgic gait       Assessment & Plan:

## 2011-09-21 NOTE — Patient Instructions (Signed)
Start the flexeril at bedtime Use the ultram for pain I will call the results of your x-ray

## 2011-09-22 NOTE — Assessment & Plan Note (Signed)
Obtain lumbar x-ray as this could be referred pain from back, other differentials, muscle strain, sciatica.

## 2011-09-22 NOTE — Assessment & Plan Note (Signed)
Obtain x-ray of hip Ultram prn Muscle relaxant

## 2011-11-09 ENCOUNTER — Encounter: Payer: Self-pay | Admitting: Family Medicine

## 2011-11-11 LAB — BASIC METABOLIC PANEL
Calcium: 10.1 mg/dL (ref 8.4–10.5)
Potassium: 4.1 mEq/L (ref 3.5–5.3)
Sodium: 142 mEq/L (ref 135–145)

## 2011-11-11 LAB — HEMOGLOBIN A1C
Hgb A1c MFr Bld: 5.9 % — ABNORMAL HIGH (ref <5.7)
Mean Plasma Glucose: 123 mg/dL — ABNORMAL HIGH (ref <117)

## 2011-11-11 LAB — HEPATIC FUNCTION PANEL
Bilirubin, Direct: 0.1 mg/dL (ref 0.0–0.3)
Total Bilirubin: 0.7 mg/dL (ref 0.3–1.2)

## 2011-11-11 LAB — LIPID PANEL
Cholesterol: 185 mg/dL (ref 0–200)
Total CHOL/HDL Ratio: 5.1 Ratio

## 2011-11-17 ENCOUNTER — Encounter: Payer: Self-pay | Admitting: Family Medicine

## 2011-11-17 ENCOUNTER — Ambulatory Visit (INDEPENDENT_AMBULATORY_CARE_PROVIDER_SITE_OTHER): Payer: 59 | Admitting: Family Medicine

## 2011-11-17 VITALS — BP 130/80 | HR 74 | Resp 16 | Ht 63.5 in | Wt 142.4 lb

## 2011-11-17 DIAGNOSIS — M549 Dorsalgia, unspecified: Secondary | ICD-10-CM

## 2011-11-17 DIAGNOSIS — R7309 Other abnormal glucose: Secondary | ICD-10-CM

## 2011-11-17 DIAGNOSIS — E785 Hyperlipidemia, unspecified: Secondary | ICD-10-CM

## 2011-11-17 DIAGNOSIS — R7302 Impaired glucose tolerance (oral): Secondary | ICD-10-CM

## 2011-11-17 DIAGNOSIS — F17200 Nicotine dependence, unspecified, uncomplicated: Secondary | ICD-10-CM

## 2011-11-17 MED ORDER — PREDNISONE (PAK) 5 MG PO TABS: ORAL_TABLET | ORAL | Status: DC

## 2011-11-17 MED ORDER — CYCLOBENZAPRINE HCL 10 MG PO TABS: 10.0000 mg | ORAL_TABLET | Freq: Every evening | ORAL | Status: AC | PRN

## 2011-11-17 NOTE — Progress Notes (Signed)
  Subjective:    Patient ID: Andrea Simon, female    DOB: 05-06-1956, 55 y.o.   MRN: [id number]  HPI The PT is here for follow up and re-evaluation of chronic medical conditions, medication management and review of any available recent lab and radiology data.  Preventive health is updated, specifically  Cancer screening and Immunization.   States she did not take the tramadol, made her nauseated  Continues to experience left hip and lower extremity pain and tingling, also low back pain    Review of Systems See HPI Denies recent fever or chills. Denies sinus pressure, nasal congestion, ear pain or sore throat. Denies chest congestion, productive cough or wheezing. Denies chest pains, palpitations and leg swelling Denies abdominal pain, nausea, vomiting,diarrhea or constipation.   Denies dysuria, frequency, hesitancy or incontinence. Denies headaches, seizures, numbness, or tingling. Denies depression, anxiety or insomnia. Denies skin break down or rash.        Objective:   Physical Exam Patient alert and oriented and in no cardiopulmonary distress.  HEENT: No facial asymmetry, EOMI, no sinus tenderness,  oropharynx pink and moist.  Neck supple no adenopathy.  Chest: Clear to auscultation bilaterally.  CVS: S1, S2 no murmurs, no S3.  ABD: Soft non tender. Bowel sounds normal.  Ext: No edema  MS: Adequate though reduced  ROM spine, and left  Hip, adequate in shoulders  and knees.  Skin: Intact, no ulcerations or rash noted.  Psych: Good eye contact, normal affect. Memory intact not anxious or depressed appearing.  CNS: CN 2-12 intact, power, tone and sensation normal throughout.        Assessment & Plan:

## 2011-11-17 NOTE — Patient Instructions (Signed)
CPE in 6 months.  Fasting lipid, hepatic, hBa1C in 6 months.  Congrats labs are much better.  Prednisone dose pack prescribed

## 2011-11-22 NOTE — Assessment & Plan Note (Signed)
Unchanged cessation  counselling done

## 2011-11-22 NOTE — Assessment & Plan Note (Signed)
Marked improvement  With  dietary  change in hba1c , down to 5.9, pt applauded on this

## 2011-11-22 NOTE — Assessment & Plan Note (Signed)
Improved, pt to continue current medication and follow a low fat diet

## 2011-11-22 NOTE — Assessment & Plan Note (Signed)
Unchanged, continues to have left lower extremity pain with numbness, resistant to MRI at this time, willing to try steroid dose pack

## 2012-02-24 ENCOUNTER — Other Ambulatory Visit: Payer: Self-pay | Admitting: Family Medicine

## 2012-05-10 ENCOUNTER — Encounter: Payer: 59 | Admitting: Family Medicine

## 2012-06-05 LAB — LIPID PANEL
Cholesterol: 192 mg/dL (ref 0–200)
Triglycerides: 135 mg/dL (ref <150)

## 2012-06-05 LAB — HEPATIC FUNCTION PANEL
ALT: 27 U/L (ref 0–35)
AST: 21 U/L (ref 0–37)
Albumin: 4.3 g/dL (ref 3.5–5.2)
Alkaline Phosphatase: 74 U/L (ref 39–117)
Bilirubin, Direct: 0.2 mg/dL (ref 0.0–0.3)
Indirect Bilirubin: 0.8 mg/dL (ref 0.0–0.9)

## 2012-06-05 LAB — HEMOGLOBIN A1C: Hgb A1c MFr Bld: 6 % — ABNORMAL HIGH (ref <5.7)

## 2012-06-14 ENCOUNTER — Encounter (INDEPENDENT_AMBULATORY_CARE_PROVIDER_SITE_OTHER): Payer: Self-pay | Admitting: *Deleted

## 2012-06-14 ENCOUNTER — Encounter: Payer: Self-pay | Admitting: Family Medicine

## 2012-06-14 ENCOUNTER — Ambulatory Visit (INDEPENDENT_AMBULATORY_CARE_PROVIDER_SITE_OTHER): Payer: 59 | Admitting: Family Medicine

## 2012-06-14 ENCOUNTER — Other Ambulatory Visit (HOSPITAL_COMMUNITY)
Admission: RE | Admit: 2012-06-14 | Discharge: 2012-06-14 | Disposition: A | Discharge status: Home / Self Care | Payer: 59 | Source: Ambulatory Visit | Attending: Family Medicine | Admitting: Family Medicine

## 2012-06-14 VITALS — BP 130/76 | HR 82 | Resp 18 | Ht 63.5 in | Wt 141.0 lb

## 2012-06-14 DIAGNOSIS — Z0000 Encounter for general adult medical examination without abnormal findings: Secondary | ICD-10-CM

## 2012-06-14 DIAGNOSIS — R7309 Other abnormal glucose: Secondary | ICD-10-CM

## 2012-06-14 DIAGNOSIS — F17200 Nicotine dependence, unspecified, uncomplicated: Secondary | ICD-10-CM

## 2012-06-14 DIAGNOSIS — E041 Nontoxic single thyroid nodule: Secondary | ICD-10-CM

## 2012-06-14 DIAGNOSIS — K8020 Calculus of gallbladder without cholecystitis without obstruction: Secondary | ICD-10-CM

## 2012-06-14 DIAGNOSIS — E663 Overweight: Secondary | ICD-10-CM

## 2012-06-14 DIAGNOSIS — Z1211 Encounter for screening for malignant neoplasm of colon: Secondary | ICD-10-CM

## 2012-06-14 DIAGNOSIS — R7301 Impaired fasting glucose: Secondary | ICD-10-CM

## 2012-06-14 DIAGNOSIS — E785 Hyperlipidemia, unspecified: Secondary | ICD-10-CM

## 2012-06-14 DIAGNOSIS — Z01419 Encounter for gynecological examination (general) (routine) without abnormal findings: Secondary | ICD-10-CM | POA: Insufficient documentation

## 2012-06-14 NOTE — Assessment & Plan Note (Signed)
36 pack year history, unwilling to comiting to quitting at this time

## 2012-06-14 NOTE — Patient Instructions (Addendum)
F/u early November.  Please work on weight loss through changing diet, cut back on sweets and carbs, as well as regular physical activity as able.  Your blood sugar control is not as good as it was, and your bad cholesterol is still slightly higher than we would prefer.  Continue current medication for cholesterol control.  You are being referred to Dr Karilyn Cota for a screening colonoscopy , also for chest CT scan  You really need to think seriously about smoking cessation, since nicotine use increases the risk of every type of cancer, heart disease and stroke.  There is a lot of help available to assist you with quitting Gallstones need to be removed, in a cholecystectomy at some time before they become a problem  Please think about quitting smoking.  This is very important for your health.  Consider setting a quit date, then cutting back or switching brands to prepare to stop.  Also think of the money you will save every day by not smoking.  Quick Tips to Quit Smoking: Fix a date i.e. keep a date in mind from when you would not touch a tobacco product to smoke  Keep yourself busy and block your mind with work loads or reading books or watching movies in malls where smoking is not allowed  Vanish off the things which reminds you about smoking for example match box, or your favorite lighter, or the pipe you used for smoking, or your favorite jeans and shirt with which you used to enjoy smoking, or the club where you used to do smoking  Try to avoid certain people places and incidences where and with whom smoking is a common factor to add on  Praise yourself with some token gifts from the money you saved by stopping smoking  Anti Smoking teams are there to help you. Join their programs  Anti-smoking Gums are there in many medical shops. Try them to quit smoking   Side-effects of Smoking: Disease caused by smoking cigarettes are emphysema, bronchitis, heart failures  Premature death  Cancer  is the major side effect of smoking  Heart attacks and strokes are the quick effects of smoking causing sudden death  Some smokers lives end up with limbs amputated  Breathing problem or fast breathing is another side effect of smoking  Due to more intakes of smokes, carbon mono-oxide goes into your brain and other muscles of the body which leads to swelling of the veins and blockage to the air passage to lungs  Carbon monoxide blocks blood vessels which leads to blockage in the flow of blood to different major body organs like heart lungs and thus leads to attacks and deaths  During pregnancy smoking is very harmful and leads to premature birth of the infant, spontaneous abortions, low weight of the infant during birth  Fat depositions to narrow and blocked blood vessels causing heart attacks  In many cases cigarette smoking caused infertility in men    Non fasting HBA1C in November

## 2012-06-20 NOTE — Assessment & Plan Note (Signed)
Deteriorated, low carb diet addressed

## 2012-06-21 DIAGNOSIS — Z0000 Encounter for general adult medical examination without abnormal findings: Secondary | ICD-10-CM | POA: Insufficient documentation

## 2012-06-21 NOTE — Progress Notes (Signed)
  Subjective:    Patient ID: Andrea Simon, female    DOB: 08-26-1956, 56 y.o.   MRN: [id number]  HPI    Review of Systems     Objective:   Physical Exam        Assessment & Plan:

## 2012-06-21 NOTE — Assessment & Plan Note (Signed)
Deteriorated, low carb diet discussed and encouraged

## 2012-06-21 NOTE — Assessment & Plan Note (Signed)
Lifestyle change discused and encouraged, the importance of smoking cessation also stressed

## 2012-06-21 NOTE — Assessment & Plan Note (Signed)
Unchanged. Patient re-educated about  the importance of commitment to a  minimum of 150 minutes of exercise per week. The importance of healthy food choices with portion control discussed. Encouraged to start a food diary, count calories and to consider  joining a support group. Sample diet sheets offered. Goals set by the patient for the next several months.    

## 2012-06-21 NOTE — Assessment & Plan Note (Signed)
Hyperlipidemia:Low fat diet discussed and encouraged.  LDL elevated, increased risk of CAd

## 2012-07-09 ENCOUNTER — Other Ambulatory Visit: Payer: Self-pay | Admitting: Family Medicine

## 2012-10-28 ENCOUNTER — Ambulatory Visit: Payer: 59 | Admitting: Family Medicine

## 2012-11-22 ENCOUNTER — Other Ambulatory Visit: Payer: Self-pay | Admitting: Family Medicine

## 2012-12-17 ENCOUNTER — Other Ambulatory Visit: Payer: Self-pay | Admitting: Family Medicine

## 2012-12-18 LAB — CBC
Hemoglobin: 14.6 g/dL (ref 12.0–15.0)
Platelets: 209 10*3/uL (ref 150–400)
RBC: 4.39 MIL/uL (ref 3.87–5.11)
WBC: 5 10*3/uL (ref 4.0–10.5)

## 2012-12-18 LAB — BASIC METABOLIC PANEL
CO2: 28 mEq/L (ref 19–32)
Calcium: 9.6 mg/dL (ref 8.4–10.5)
Chloride: 105 mEq/L (ref 96–112)
Potassium: 4 mEq/L (ref 3.5–5.3)
Sodium: 142 mEq/L (ref 135–145)

## 2012-12-18 LAB — HEMOGLOBIN A1C: Mean Plasma Glucose: 126 mg/dL — ABNORMAL HIGH (ref <117)

## 2012-12-23 ENCOUNTER — Encounter: Payer: Self-pay | Admitting: Family Medicine

## 2012-12-23 ENCOUNTER — Ambulatory Visit (INDEPENDENT_AMBULATORY_CARE_PROVIDER_SITE_OTHER): Payer: 59 | Admitting: Family Medicine

## 2012-12-23 VITALS — BP 136/74 | HR 74 | Temp 98.5°F | Resp 16 | Ht 63.5 in | Wt 142.0 lb

## 2012-12-23 DIAGNOSIS — E663 Overweight: Secondary | ICD-10-CM

## 2012-12-23 DIAGNOSIS — J309 Allergic rhinitis, unspecified: Secondary | ICD-10-CM

## 2012-12-23 DIAGNOSIS — E785 Hyperlipidemia, unspecified: Secondary | ICD-10-CM

## 2012-12-23 DIAGNOSIS — E559 Vitamin D deficiency, unspecified: Secondary | ICD-10-CM

## 2012-12-23 DIAGNOSIS — F17200 Nicotine dependence, unspecified, uncomplicated: Secondary | ICD-10-CM

## 2012-12-23 DIAGNOSIS — R7301 Impaired fasting glucose: Secondary | ICD-10-CM

## 2012-12-23 DIAGNOSIS — R7309 Other abnormal glucose: Secondary | ICD-10-CM

## 2012-12-23 MED ORDER — BUPROPION HCL ER (SR) 150 MG PO TB12: 150.0000 mg | ORAL_TABLET | Freq: Two times a day (BID) | ORAL | Status: DC

## 2012-12-23 NOTE — Progress Notes (Signed)
  Subjective:    Patient ID: Andrea Simon, female    DOB: 1956-08-09, 57 y.o.   MRN: [id number]  HPI The PT is here for follow up and re-evaluation of chronic medical conditions, medication management and review of any available recent lab and radiology data.  Preventive health is updated, specifically  Cancer screening and Immunization. Needs colonoscopy and mammogram also flu vaccinee  Questions or concerns regarding consultations or procedures which the PT has had in the interim are  addressed. The PT denies any adverse reactions to current medications since the last visit.  2 day h/o excessive clear nasal drainage , denies fever , chills , ear pain or cough, has sinus pressure.Current nicotine 1 pPD wants to quit and will use zyban to help. She is applauded on the decision, and is also given supportive literature to assist her      Review of Systems See HPI Denies chest pains, palpitations and leg swelling Denies abdominal pain, nausea, vomiting,diarrhea or constipation.   Denies dysuria, frequency, hesitancy or incontinence. Denies joint pain, swelling and limitation in mobility. Denies headaches, seizures, numbness, or tingling. Denies depression, anxiety or insomnia. Denies skin break down or rash.        Objective:   Physical Exam Patient alert and oriented and in no cardiopulmonary distress.  HEENT: No facial asymmetry, EOMI, no sinus tenderness,  oropharynx pink and moist.  Neck supple no adenopathy.  Chest: Clear to auscultation bilaterally.Decreased air entry throughout  CVS: S1, S2 no murmurs, no S3.  ABD: Soft non tender. Bowel sounds normal.  Ext: No edema  MS: Adequate ROM spine, shoulders, hips and knees.  Skin: Intact, no ulcerations or rash noted.  Psych: Good eye contact, normal affect. Memory intact not anxious or depressed appearing.  CNS: CN 2-12 intact, power, tone and sensation normal throughout.        Assessment & Plan:

## 2012-12-23 NOTE — Patient Instructions (Addendum)
F/u in 6 month, please call if you need me before.  Fasting lipid, vit D, hepatic and HBA1C in 6 month  Take sudafed one twice daily for 2 days , then 1 daily as needed for excessive nasal drainage and congestion wih cough. Call in the next week if symptoms worsen as in fever, chills and yellow green drainage.  You need to schedule  your mammogram and also the appt for chest CT scan check with referral staff at checkout.  Zyban is sent in for smoking cessation, you need to quit in 2 weeks , you will also get additional info to help with this.Take one tablet once daily for the first 3 days  Start aspirin 81mg  one daily, and calcium with D 1200mg  /1000IU one daily

## 2012-12-24 DIAGNOSIS — J309 Allergic rhinitis, unspecified: Secondary | ICD-10-CM | POA: Insufficient documentation

## 2012-12-24 NOTE — Assessment & Plan Note (Signed)
increased symptoms due to change in season and low  temperature. OTC med use as  needed discussed

## 2012-12-24 NOTE — Assessment & Plan Note (Signed)
Hyperlipidemia:Low fat diet discussed and encouraged.  Controlled, no change in medication   

## 2012-12-24 NOTE — Assessment & Plan Note (Signed)
Unchanged. Patient re-educated about  the importance of commitment to a  minimum of 150 minutes of exercise per week. The importance of healthy food choices with portion control discussed. Encouraged to start a food diary, count calories and to consider  joining a support group. Sample diet sheets offered. Goals set by the patient for the next several months.    

## 2012-12-24 NOTE — Progress Notes (Signed)
  Subjective:    Patient ID: Andrea Simon, female    DOB: [birth year], 57 y.o.   MRN: [id number]  HPI Pt is here for annual exam and to review lab and radiologic data. Still smoking with no commitment to quitting qt this time   Review of Systems See HPI Denies recent fever or chills. Denies sinus pressure, nasal congestion, ear pain or sore throat. Denies chest congestion, productive cough or wheezing. Denies chest pains, palpitations and leg swelling Denies abdominal pain, nausea, vomiting,diarrhea or constipation.   Denies dysuria, frequency, hesitancy or incontinence. Chronic back pain which does limit activity Denies headaches, seizures, numbness, or tingling. Denies depression, anxiety or insomnia. Denies skin break down or rash.        Objective:   Physical Exam Pleasant well nourished female, alert and oriented x 3, in no cardio-pulmonary distress. Afebrile. HEENT No facial trauma or asymetry. Sinuses non tender.  EOMI, PERTL, fundoscopic exam  no hemorhage or exudate.  External ears normal, tympanic membranes clear. Oropharynx moist, no exudate, fair dentition. Neck: supple, no adenopathy,JVD positive  thyromegaly.No bruits.  Chest: Clear to ascultation bilaterally.No crackles or wheezes. Non tender to palpation  Breast: No asymetry,no masses. No nipple discharge or inversion. No axillary or supraclavicular adenopathy  Cardiovascular system; Heart sounds normal,  S1 and  S2 ,no S3.  No murmur, or thrill. Apical beat not displaced Peripheral pulses normal.  Abdomen: Soft, non tender, no organomegaly or masses. No bruits. Bowel sounds normal. No guarding, tenderness or rebound.  Rectal:  No mass. Guaiac negative stool.  GU: External genitalia normal. No lesions. Vaginal canal normal.No discharge. Uterus normal size, no adnexal masses, no cervical motion or adnexal tenderness.  Musculoskeletal exam: Adequate ROM of spine, hips , shoulders and  knees. No deformity ,swelling or crepitus noted. No muscle wasting or atrophy.   Neurologic: Cranial nerves 2 to 12 intact. Power, tone ,sensation and reflexes normal throughout. No disturbance in gait. No tremor.  Skin: Intact, no ulceration, erythema , scaling or rash noted. Pigmentation normal throughout  Psych; Normal mood and affect. Judgement and concentration normal        Assessment & Plan:

## 2012-12-24 NOTE — Assessment & Plan Note (Signed)
1 PPD currently. Wans to quit, Patient counseled for approximately 5 minutes regarding the health risks of ongoing nicotine use, specifically all types of cancer, heart disease, stroke and respiratory failure. The options available for help with cessation ,the behavioral changes to assist the process, and the option to either gradully reduce usage  Or abruptly stop.is also discussed. Pt is also encouraged to set specific goals in number of cigarettes used daily, as well as to set a quit date. Script for zyban given with advice to set quit date for 2 weeks after starting

## 2012-12-24 NOTE — Assessment & Plan Note (Signed)
Unchanged,  Patient educated about the importance of limiting  Carbohydrate intake , the need to commit to daily physical activity for a minimum of 30 minutes , and to commit weight loss. The fact that changes in all these areas will reduce or eliminate all together the development of diabetes is stressed.

## 2013-03-05 ENCOUNTER — Telehealth: Payer: Self-pay | Admitting: Family Medicine

## 2013-03-05 ENCOUNTER — Other Ambulatory Visit: Payer: Self-pay | Admitting: Family Medicine

## 2013-03-05 DIAGNOSIS — F17200 Nicotine dependence, unspecified, uncomplicated: Secondary | ICD-10-CM

## 2013-03-05 NOTE — Telephone Encounter (Signed)
I have re entered the referral for chest Ct scan to screen for lung cancer on this pt, pls see if you can get this approved

## 2013-03-06 ENCOUNTER — Telehealth: Payer: Self-pay | Admitting: Family Medicine

## 2013-03-06 NOTE — Telephone Encounter (Signed)
Dr. Simpson is aware °

## 2013-04-20 ENCOUNTER — Other Ambulatory Visit: Payer: Self-pay | Admitting: Family Medicine

## 2013-04-20 DIAGNOSIS — Z139 Encounter for screening, unspecified: Secondary | ICD-10-CM

## 2013-04-21 ENCOUNTER — Ambulatory Visit (HOSPITAL_COMMUNITY)
Admission: RE | Admit: 2013-04-21 | Discharge: 2013-04-21 | Disposition: A | Discharge status: Home / Self Care | Payer: 59 | Source: Ambulatory Visit | Attending: Family Medicine | Admitting: Family Medicine

## 2013-04-21 DIAGNOSIS — Z1231 Encounter for screening mammogram for malignant neoplasm of breast: Secondary | ICD-10-CM | POA: Insufficient documentation

## 2013-04-21 DIAGNOSIS — Z139 Encounter for screening, unspecified: Secondary | ICD-10-CM

## 2013-06-07 ENCOUNTER — Encounter: Payer: 59 | Admitting: Family Medicine

## 2013-06-14 ENCOUNTER — Other Ambulatory Visit: Payer: Self-pay

## 2013-06-14 MED ORDER — PRAVASTATIN SODIUM 40 MG PO TABS: ORAL_TABLET | ORAL | Status: DC

## 2013-06-19 LAB — LIPID PANEL
LDL Cholesterol: 128 mg/dL — ABNORMAL HIGH (ref 0–99)
Total CHOL/HDL Ratio: 5.3 Ratio
VLDL: 28 mg/dL (ref 0–40)

## 2013-06-19 LAB — HEPATIC FUNCTION PANEL
AST: 25 U/L (ref 0–37)
Albumin: 4.6 g/dL (ref 3.5–5.2)
Total Bilirubin: 0.8 mg/dL (ref 0.3–1.2)

## 2013-06-19 LAB — HEMOGLOBIN A1C: Hgb A1c MFr Bld: 6.1 % — ABNORMAL HIGH (ref <5.7)

## 2013-06-20 ENCOUNTER — Telehealth: Payer: Self-pay | Admitting: Family Medicine

## 2013-06-20 ENCOUNTER — Encounter: Payer: Self-pay | Admitting: Family Medicine

## 2013-06-20 ENCOUNTER — Ambulatory Visit (INDEPENDENT_AMBULATORY_CARE_PROVIDER_SITE_OTHER): Payer: 59 | Admitting: Family Medicine

## 2013-06-20 ENCOUNTER — Encounter (INDEPENDENT_AMBULATORY_CARE_PROVIDER_SITE_OTHER): Payer: Self-pay | Admitting: *Deleted

## 2013-06-20 ENCOUNTER — Other Ambulatory Visit (HOSPITAL_COMMUNITY)
Admission: RE | Admit: 2013-06-20 | Discharge: 2013-06-20 | Disposition: A | Discharge status: Home / Self Care | Payer: 59 | Source: Ambulatory Visit | Attending: Family Medicine | Admitting: Family Medicine

## 2013-06-20 VITALS — BP 114/72 | HR 83 | Resp 18 | Ht 63.0 in | Wt 143.0 lb

## 2013-06-20 DIAGNOSIS — Z1211 Encounter for screening for malignant neoplasm of colon: Secondary | ICD-10-CM

## 2013-06-20 DIAGNOSIS — Z1212 Encounter for screening for malignant neoplasm of rectum: Secondary | ICD-10-CM

## 2013-06-20 DIAGNOSIS — R7309 Other abnormal glucose: Secondary | ICD-10-CM

## 2013-06-20 DIAGNOSIS — Z1151 Encounter for screening for human papillomavirus (HPV): Secondary | ICD-10-CM | POA: Insufficient documentation

## 2013-06-20 DIAGNOSIS — Z01419 Encounter for gynecological examination (general) (routine) without abnormal findings: Secondary | ICD-10-CM | POA: Insufficient documentation

## 2013-06-20 DIAGNOSIS — F17200 Nicotine dependence, unspecified, uncomplicated: Secondary | ICD-10-CM

## 2013-06-20 DIAGNOSIS — E663 Overweight: Secondary | ICD-10-CM

## 2013-06-20 DIAGNOSIS — Z0000 Encounter for general adult medical examination without abnormal findings: Secondary | ICD-10-CM

## 2013-06-20 DIAGNOSIS — E785 Hyperlipidemia, unspecified: Secondary | ICD-10-CM

## 2013-06-20 DIAGNOSIS — E041 Nontoxic single thyroid nodule: Secondary | ICD-10-CM

## 2013-06-20 DIAGNOSIS — E8881 Metabolic syndrome: Secondary | ICD-10-CM

## 2013-06-20 LAB — VITAMIN D 25 HYDROXY (VIT D DEFICIENCY, FRACTURES): Vit D, 25-Hydroxy: 45 ng/mL (ref 30–89)

## 2013-06-20 LAB — POC HEMOCCULT BLD/STL (OFFICE/1-CARD/DIAGNOSTIC): Fecal Occult Blood, POC: NEGATIVE

## 2013-06-20 MED ORDER — BUPROPION HCL ER (SR) 100 MG PO TB12: 100.0000 mg | ORAL_TABLET | Freq: Two times a day (BID) | ORAL | Status: DC

## 2013-06-20 NOTE — Telephone Encounter (Signed)
Patient is aware 

## 2013-06-20 NOTE — Patient Instructions (Addendum)
F/u mid December , call if you need me before  You are referred for colonoscopy also for ultrasound of the thyroid, slightly enlarged and you had a nodule in the past  Pls set quit date for July 15, even though script states twice daily for the zyban oK to take once daily since that is what you are able to tolerate.  Aim to eat between 1200 to 15000 calories daily, n over a 12 hour period each day eg 7am to 7 pm  Only drink water or unsweetened/sugar free  beverages   Weight loss goal of 10 pounds  Increase exercise to 6 days per week, minimum of 30 minutes each time  Fasting lipid , HBA1C and TSH in December before visit  PLEASE start aspirin 81mg  once daily, to reduce stroke risk

## 2013-06-22 ENCOUNTER — Ambulatory Visit (HOSPITAL_COMMUNITY)
Admission: RE | Admit: 2013-06-22 | Discharge: 2013-06-22 | Disposition: A | Discharge status: Home / Self Care | Payer: 59 | Source: Ambulatory Visit | Attending: Family Medicine | Admitting: Family Medicine

## 2013-06-22 DIAGNOSIS — E041 Nontoxic single thyroid nodule: Secondary | ICD-10-CM

## 2013-06-22 DIAGNOSIS — E042 Nontoxic multinodular goiter: Secondary | ICD-10-CM | POA: Insufficient documentation

## 2013-06-22 DIAGNOSIS — E049 Nontoxic goiter, unspecified: Secondary | ICD-10-CM | POA: Insufficient documentation

## 2013-06-25 DIAGNOSIS — E8881 Metabolic syndrome: Secondary | ICD-10-CM | POA: Insufficient documentation

## 2013-06-25 DIAGNOSIS — Z0000 Encounter for general adult medical examination without abnormal findings: Secondary | ICD-10-CM | POA: Insufficient documentation

## 2013-06-25 NOTE — Assessment & Plan Note (Signed)
Deteriorated Patient counseled for approximately 5 minutes regarding the health risks of ongoing nicotine use, specifically all types of cancer, heart disease, stroke and respiratory failure. The options available for help with cessation ,the behavioral changes to assist the process, and the option to either gradully reduce usage  Or abruptly stop.is also discussed. Pt is also encouraged to set specific goals in number of cigarettes used daily, as well as to set a quit date.    

## 2013-06-25 NOTE — Assessment & Plan Note (Signed)
Needs f/u imaging will refer for same

## 2013-06-25 NOTE — Assessment & Plan Note (Signed)
Deteriorated, :Low fat diet discussed and encouraged.

## 2013-06-25 NOTE — Assessment & Plan Note (Signed)
Unchanged. Patient re-educated about  the importance of commitment to a  minimum of 150 minutes of exercise per week. The importance of healthy food choices with portion control discussed. Encouraged to start a food diary, count calories and to consider  joining a support group. Sample diet sheets offered. Goals set by the patient for the next several months.    

## 2013-06-25 NOTE — Assessment & Plan Note (Signed)
The increased risk of CV disease associated with this dx is discusse, pt educated re need to address and change underlying/contributing  health issues

## 2013-06-25 NOTE — Assessment & Plan Note (Signed)
Deteriorated Patient educated about the importance of limiting  Carbohydrate intake , the need to commit to daily physical activity for a minimum of 30 minutes , and to commit weight loss. The fact that changes in all these areas will reduce or eliminate all together the development of diabetes is stressed.    

## 2013-06-25 NOTE — Assessment & Plan Note (Signed)
Pelvic , breast and rectal exam completed as documented. Approx 20 mins spent in health education re need for lifestyle change in a comited manner, so that chronic health issues are addressed. Pt also to resume zyban and remain on this for a longer period to stop smoking, she is motivated l

## 2013-06-25 NOTE — Progress Notes (Signed)
  Subjective:    Patient ID: Andrea Simon, female    DOB: [birth year], 57 y.o.   MRN: [id number]  HPI  The PT is here for annual exam and re-evaluation of chronic medical conditions, medication management and review of any available recent lab and radiology data.  Preventive health is updated, specifically  Cancer screening and Immunization.  Colonoscopy is still outstanding  The PT denies any adverse reactions to current medications since the last visit.  Distressed re recurrence in nicotine, wants to quit, wants help. Unhappy with labs which have worsened, but she admits to inconsistency in lifestyle changes     Review of Systems See HPI Denies recent fever or chills. Denies sinus pressure, nasal congestion, ear pain or sore throat. Denies chest congestion, productive cough or wheezing. Denies chest pains, palpitations and leg swelling Denies abdominal pain, nausea, vomiting,diarrhea or constipation.   Denies dysuria, frequency, hesitancy or incontinence. Denies joint pain, swelling and limitation in mobility. Denies headaches, seizures, numbness, or tingling. Denies depression, anxiety or insomnia. Denies skin break down or rash.        Objective:   Physical Exam  Pleasant well nourished female, alert and oriented x 3, in no cardio-pulmonary distress. Afebrile. HEENT No facial trauma or asymetry. Sinuses non tender.  EOMI, PERTL, fundoscopic exam is normal, no hemorhage or exudate.  External ears normal, tympanic membranes clear. Oropharynx moist, no exudate,fairly  good dentition. Neck: supple, no adenopathy,JVD .No bruits.Thromegaly  Chest: Clear to ascultation bilaterally.No crackles or wheezes. Non tender to palpation  Breast: No asymetry,no masses. No nipple discharge or inversion. No axillary or supraclavicular adenopathy  Cardiovascular system; Heart sounds normal,  S1 and  S2 ,no S3.  No murmur, or thrill. Apical beat not displaced Peripheral  pulses normal.  Abdomen: Soft, non tender, no organomegaly or masses. No bruits. Bowel sounds normal. No guarding, tenderness or rebound.  Rectal:  No mass. Guaiac negative stool.  GU: External genitalia normal. No lesions. Vaginal canal normal.No discharge. Uterus normal size, no adnexal masses, no cervical motion or adnexal tenderness.  Musculoskeletal exam: Full ROM of spine, hips , shoulders and knees. No deformity ,swelling or crepitus noted. No muscle wasting or atrophy.   Neurologic: Cranial nerves 2 to 12 intact. Power, tone ,sensation and reflexes normal throughout. No disturbance in gait. No tremor.  Skin: Intact, no ulceration, erythema , scaling or rash noted. Pigmentation normal throughout  Psych; Normal mood and affect. Judgement and concentration normal       Assessment & Plan:

## 2013-06-26 ENCOUNTER — Other Ambulatory Visit: Payer: Self-pay | Admitting: Family Medicine

## 2013-06-26 DIAGNOSIS — E041 Nontoxic single thyroid nodule: Secondary | ICD-10-CM

## 2013-06-28 NOTE — Addendum Note (Signed)
Addended by: Kandis Fantasia B on: 06/28/2013 09:54 AM   Modules accepted: Orders

## 2013-07-04 LAB — TSH: TSH: 1.074 u[IU]/mL (ref 0.350–4.500)

## 2013-07-06 ENCOUNTER — Ambulatory Visit (INDEPENDENT_AMBULATORY_CARE_PROVIDER_SITE_OTHER): Payer: 59 | Admitting: Otolaryngology

## 2013-07-06 DIAGNOSIS — D449 Neoplasm of uncertain behavior of unspecified endocrine gland: Secondary | ICD-10-CM

## 2013-08-22 ENCOUNTER — Other Ambulatory Visit (INDEPENDENT_AMBULATORY_CARE_PROVIDER_SITE_OTHER): Payer: Self-pay | Admitting: Otolaryngology

## 2013-08-22 DIAGNOSIS — E042 Nontoxic multinodular goiter: Secondary | ICD-10-CM

## 2013-08-29 ENCOUNTER — Ambulatory Visit (HOSPITAL_COMMUNITY)
Admission: RE | Admit: 2013-08-29 | Discharge: 2013-08-29 | Disposition: A | Discharge status: Home / Self Care | Payer: 59 | Source: Ambulatory Visit | Attending: Otolaryngology | Admitting: Otolaryngology

## 2013-08-29 ENCOUNTER — Encounter (HOSPITAL_COMMUNITY): Payer: Self-pay

## 2013-08-29 VITALS — BP 142/85 | HR 66 | Temp 97.9°F | Resp 16

## 2013-08-29 DIAGNOSIS — E042 Nontoxic multinodular goiter: Secondary | ICD-10-CM

## 2013-08-29 MED ORDER — LIDOCAINE HCL (PF) 2 % IJ SOLN
INTRAMUSCULAR | Status: AC
  Filled 2013-08-29: qty 10

## 2013-08-29 MED ORDER — LIDOCAINE HCL (PF) 2 % IJ SOLN
10.0000 mL | Freq: Once | INTRAMUSCULAR | Status: DC
  Filled 2013-08-29: qty 10

## 2013-10-26 ENCOUNTER — Other Ambulatory Visit: Payer: Self-pay

## 2013-12-05 ENCOUNTER — Encounter: Payer: Self-pay | Admitting: Family Medicine

## 2013-12-05 ENCOUNTER — Ambulatory Visit (INDEPENDENT_AMBULATORY_CARE_PROVIDER_SITE_OTHER): Payer: 59 | Admitting: Family Medicine

## 2013-12-05 VITALS — BP 114/64 | HR 66 | Resp 18 | Ht 63.0 in | Wt 134.0 lb

## 2013-12-05 DIAGNOSIS — M549 Dorsalgia, unspecified: Secondary | ICD-10-CM

## 2013-12-05 DIAGNOSIS — K8020 Calculus of gallbladder without cholecystitis without obstruction: Secondary | ICD-10-CM

## 2013-12-05 DIAGNOSIS — R7309 Other abnormal glucose: Secondary | ICD-10-CM

## 2013-12-05 DIAGNOSIS — F17200 Nicotine dependence, unspecified, uncomplicated: Secondary | ICD-10-CM

## 2013-12-05 DIAGNOSIS — N951 Menopausal and female climacteric states: Secondary | ICD-10-CM

## 2013-12-05 DIAGNOSIS — E041 Nontoxic single thyroid nodule: Secondary | ICD-10-CM

## 2013-12-05 DIAGNOSIS — R232 Flushing: Secondary | ICD-10-CM

## 2013-12-05 DIAGNOSIS — E785 Hyperlipidemia, unspecified: Secondary | ICD-10-CM | POA: Insufficient documentation

## 2013-12-05 LAB — LIPID PANEL
Cholesterol: 187 mg/dL (ref 0–200)
HDL: 39 mg/dL — ABNORMAL LOW (ref >39)
Triglycerides: 131 mg/dL (ref <150)

## 2013-12-05 LAB — HEMOGLOBIN A1C: Hgb A1c MFr Bld: 5.9 % — ABNORMAL HIGH (ref <5.7)

## 2013-12-05 MED ORDER — VENLAFAXINE HCL ER 37.5 MG PO CP24: 37.5000 mg | ORAL_CAPSULE | Freq: Every day | ORAL | Status: DC

## 2013-12-05 NOTE — Progress Notes (Signed)
   Subjective:    Patient ID: Andrea Simon, female    DOB: 11-15-1956, 57 y.o.   MRN: [id number]  HPI The PT is here for follow up and re-evaluation of chronic medical conditions, medication management and review of any available recent lab and radiology data.  Preventive health is updated, specifically  Cancer screening and Immunization.  Still has to get her colonoscopy , states she will follow through on this  The PT denies any adverse reactions to current medications since the last visit.  There are no new concerns.  C/o worsening hot flashes, open to help with medication, natural management again reviewed and literature provided Has done extremely well with weight loss and had been exercising consistently for the past several month    Review of Systems See HPI Denies recent fever or chills. Denies sinus pressure, nasal congestion, ear pain or sore throat. Denies chest congestion, productive cough or wheezing. Denies chest pains, palpitations and leg swelling Denies abdominal pain, nausea, vomiting,diarrhea or constipation.   Denies dysuria, frequency, hesitancy or incontinence. Denies joint pain, swelling and limitation in mobility.Back pain improved with weight loss, and exercise  Denies headaches, seizures, numbness, or tingling. Denies depression, anxiety or insomnia. Denies skin break down or rash.        Objective:   Physical Exam  Patient alert and oriented and in no cardiopulmonary distress.  HEENT: No facial asymmetry, EOMI, no sinus tenderness,  oropharynx pink and moist.  Neck supple no adenopathy.  Chest: Clear to auscultation bilaterally.  CVS: S1, S2 no murmurs, no S3.  ABD: Soft non tender. Bowel sounds normal.  Ext: No edema  MS: Adequate ROM spine, shoulders, hips and knees.  Skin: Intact, no ulcerations or rash noted.  Psych: Good eye contact, normal affect. Memory intact not anxious or depressed appearing.  CNS: CN 2-12 intact, power,  tone and sensation normal throughout.       Assessment & Plan:

## 2013-12-05 NOTE — Patient Instructions (Addendum)
CPE in 2nd week in July, call if you need me before   CBC, fasting lipid, cmp and hBA1C in July before visit  Please work on reducing cigarettes by one each month, you DO need to quit  Congrats on weight loss  Please commit to daily exercise for bones  And "winter blues"  New for hot flashes is effexor , you have been started on the lowest dose, you can send in a messsagfe after about 5 weeks for a dose increase if it helps and you are tolerating it well  You will get info on hot flashes, natural management, the flu vaccine , so you re consider, and we will show  You the calciumgel capsule bottle Smoking Cessation, Tips for Success YOU CAN QUIT SMOKING If you are ready to quit smoking, congratulations! You have chosen to help yourself be healthier. Cigarettes bring nicotine, tar, carbon monoxide, and other irritants into your body. Your lungs, heart, and blood vessels will be able to work better without these poisons. There are many different ways to quit smoking. Nicotine gum, nicotine patches, a nicotine inhaler, or nicotine nasal spray can help with physical craving. Hypnosis, support groups, and medicines help break the habit of smoking. Here are some tips to help you quit for good.  Throw away all cigarettes.  Clean and remove all ashtrays from your home, work, and car.  On a card, write down your reasons for quitting. Carry the card with you and read it when you get the urge to smoke.  Cleanse your body of nicotine. Drink enough water and fluids to keep your urine clear or pale yellow. Do this after quitting to flush the nicotine from your body.  Learn to predict your moods. Do not let a bad situation be your excuse to have a cigarette. Some situations in your life might tempt you into wanting a cigarette.  Never have "just one" cigarette. It leads to wanting another and another. Remind yourself of your decision to quit.  Change habits associated with smoking. If you smoked while  driving or when feeling stressed, try other activities to replace smoking. Stand up when drinking your coffee. Brush your teeth after eating. Sit in a different chair when you read the paper. Avoid alcohol while trying to quit, and try to drink fewer caffeinated beverages. Alcohol and caffeine may urge you to smoke.  Avoid foods and drinks that can trigger a desire to smoke, such as sugary or spicy foods and alcohol.  Ask people who smoke not to smoke around you.  Have something planned to do right after eating or having a cup of coffee. Take a walk or exercise to perk you up. This will help to keep you from overeating.  Try a relaxation exercise to calm you down and decrease your stress. Remember, you may be tense and nervous for the first 2 weeks after you quit, but this will pass.  Find new activities to keep your hands busy. Play with a pen, coin, or rubber band. Doodle or draw things on paper.  Brush your teeth right after eating. This will help cut down on the craving for the taste of tobacco after meals. You can try mouthwash, too.  Use oral substitutes, such as lemon drops, carrots, a cinnamon stick, or chewing gum, in place of cigarettes. Keep them handy so they are available when you have the urge to smoke.  When you have the urge to smoke, try deep breathing.  Designate your home as  a nonsmoking area.  If you are a heavy smoker, ask your caregiver about a prescription for nicotine chewing gum. It can ease your withdrawal from nicotine.  Reward yourself. Set aside the cigarette money you save and buy yourself something nice.  Look for support from others. Join a support group or smoking cessation program. Ask someone at home or at work to help you with your plan to quit smoking.  Always ask yourself, "Do I need this cigarette or is this just a reflex?" Tell yourself, "Today, I choose not to smoke," or "I do not want to smoke." You are reminding yourself of your decision to quit,  even if you do smoke a cigarette. HOW WILL I FEEL WHEN I QUIT SMOKING?  The benefits of not smoking start within days of quitting.  You may have symptoms of withdrawal because your body is used to nicotine (the addictive substance in cigarettes). You may crave cigarettes, be irritable, feel very hungry, cough often, get headaches, or have difficulty concentrating.  The withdrawal symptoms are only temporary. They are strongest when you first quit but will go away within 10 to 14 days.  When withdrawal symptoms occur, stay in control. Think about your reasons for quitting. Remind yourself that these are signs that your body is healing and getting used to being without cigarettes.  Remember that withdrawal symptoms are easier to treat than the major diseases that smoking can cause.  Even after the withdrawal is over, expect periodic urges to smoke. However, these cravings are generally short-lived and will go away whether you smoke or not. Do not smoke!  If you relapse and smoke again, do not lose hope. Most smokers quit 3 times before they are successful.  If you relapse, do not give up! Plan ahead and think about what you will do the next time you get the urge to smoke. LIFE AS A NONSMOKER: MAKE IT FOR A MONTH, MAKE IT FOR LIFE Day 1: Hang this page where you will see it every day. Day 2: Get rid of all ashtrays, matches, and lighters. Day 3: Drink water. Breathe deeply between sips. Day 4: Avoid places with smoke-filled air, such as bars, clubs, or the smoking section of restaurants. Day 5: Keep track of how much money you save by not smoking. Day 6: Avoid boredom. Keep a good book with you or go to the movies. Day 7: Reward yourself! One week without smoking! Day 8: Make a dental appointment to get your teeth cleaned. Day 9: Decide how you will turn down a cigarette before it is offered to you. Day 10: Review your reasons for quitting. Day 11: Distract yourself. Stay active to keep  your mind off smoking and to relieve tension. Take a walk, exercise, read a book, do a crossword puzzle, or try a new hobby. Day 12: Exercise. Get off the bus before your stop or use stairs instead of escalators. Day 13: Call on friends for support and encouragement. Day 14: Reward yourself! Two weeks without smoking! Day 15: Practice deep breathing exercises. Day 16: Bet a friend that you can stay a nonsmoker. Day 17: Ask to sit in nonsmoking sections of restaurants. Day 18: Hang up "No Smoking" signs. Day 19: Think of yourself as a nonsmoker. Day 20: Each morning, tell yourself you will not smoke. Day 21: Reward yourself! Three weeks without smoking! Day 22: Think of smoking in negative ways. Remember how it stains your teeth, gives you bad breath, and leaves you short  of breath. Day 23: Eat a nutritious breakfast. Day 24:Do not relive your days as a smoker. Day 25: Hold a pencil in your hand when talking on the telephone. Day 26: Tell all your friends you do not smoke. Day 27: Think about how much better food tastes. Day 28: Remember, one cigarette is one too many. Day 29: Take up a hobby that will keep your hands busy. Day 30: Congratulations! One month without smoking! Give yourself a big reward. Your caregiver can direct you to community resources or hospitals for support, which may include:  Group support.  Education.  Hypnosis.  Subliminal therapy. Document Released: 09/04/2004 Document Revised: 02/29/2012 Document Reviewed: 05/25/2013 [facility] Patient Information 2014 [place], [place].

## 2013-12-08 NOTE — Assessment & Plan Note (Signed)
Being followed by ENT, normal thyroid function

## 2013-12-08 NOTE — Assessment & Plan Note (Signed)
Patient counseled for approximately 5 minutes regarding the health risks of ongoing nicotine use, specifically all types of cancer, heart disease, stroke and respiratory failure. The options available for help with cessation ,the behavioral changes to assist the process, and the option to either gradully reduce usage  Or abruptly stop.is also discussed. Pt is also encouraged to set specific goals in number of cigarettes used daily, as well as to set a quit date. Unwilling to set a quit date currently , butn I encourage her to re think this and cut back even by 1 cigarette per month

## 2013-12-08 NOTE — Assessment & Plan Note (Signed)
Asymptomatic , no surguical interbvention planned or indicated at this time, pt aware of warning signs of acute cholecystitis

## 2013-12-08 NOTE — Assessment & Plan Note (Signed)
Unchanged, has not been taking the statin daily as prescribed , denies any adverse s/e from med , just wants to "try to handle on her own" Will take med as prescribed   Hyperlipidemia:Low fat diet discussed and encouraged.

## 2013-12-08 NOTE — Assessment & Plan Note (Signed)
Increasingly a burden, literature provided and counseled re "natural approach" she is to try effexor also , this has helped many women

## 2013-12-08 NOTE — Assessment & Plan Note (Addendum)
Improved with weight loss and regular stretching exercise,

## 2013-12-08 NOTE — Assessment & Plan Note (Signed)
improved significantly, pt applauded on this

## 2014-02-07 ENCOUNTER — Other Ambulatory Visit: Payer: Self-pay | Admitting: Family Medicine

## 2014-04-13 ENCOUNTER — Other Ambulatory Visit: Payer: Self-pay

## 2014-04-13 MED ORDER — PRAVASTATIN SODIUM 40 MG PO TABS: ORAL_TABLET | ORAL | Status: DC

## 2014-07-02 ENCOUNTER — Other Ambulatory Visit: Payer: Self-pay | Admitting: Family Medicine

## 2014-07-02 LAB — HEMOGLOBIN A1C
Hgb A1c MFr Bld: 6.2 % — ABNORMAL HIGH (ref <5.7)
Mean Plasma Glucose: 131 mg/dL — ABNORMAL HIGH (ref <117)

## 2014-07-02 LAB — CBC
HEMATOCRIT: 38.7 % (ref 36.0–46.0)
HEMOGLOBIN: 13.9 g/dL (ref 12.0–15.0)
MCH: 33.5 pg (ref 26.0–34.0)
MCHC: 35.9 g/dL (ref 30.0–36.0)
MCV: 93.3 fL (ref 78.0–100.0)
Platelets: 243 10*3/uL (ref 150–400)
RBC: 4.15 MIL/uL (ref 3.87–5.11)
RDW: 13.8 % (ref 11.5–15.5)
WBC: 4.9 10*3/uL (ref 4.0–10.5)

## 2014-07-02 LAB — COMPREHENSIVE METABOLIC PANEL
ALBUMIN: 4.3 g/dL (ref 3.5–5.2)
ALK PHOS: 62 U/L (ref 39–117)
ALT: 20 U/L (ref 0–35)
AST: 22 U/L (ref 0–37)
BUN: 9 mg/dL (ref 6–23)
CO2: 27 meq/L (ref 19–32)
Calcium: 9.5 mg/dL (ref 8.4–10.5)
Chloride: 104 mEq/L (ref 96–112)
Creat: 0.85 mg/dL (ref 0.50–1.10)
GLUCOSE: 116 mg/dL — AB (ref 70–99)
Potassium: 3.7 mEq/L (ref 3.5–5.3)
SODIUM: 140 meq/L (ref 135–145)
TOTAL PROTEIN: 6.6 g/dL (ref 6.0–8.3)
Total Bilirubin: 0.7 mg/dL (ref 0.2–1.2)

## 2014-07-02 LAB — LIPID PANEL
CHOLESTEROL: 188 mg/dL (ref 0–200)
HDL: 31 mg/dL — AB (ref >39)
LDL Cholesterol: 100 mg/dL — ABNORMAL HIGH (ref 0–99)
TRIGLYCERIDES: 285 mg/dL — AB (ref <150)
Total CHOL/HDL Ratio: 6.1 Ratio
VLDL: 57 mg/dL — ABNORMAL HIGH (ref 0–40)

## 2014-07-04 ENCOUNTER — Encounter: Payer: Self-pay | Admitting: Family Medicine

## 2014-07-04 ENCOUNTER — Ambulatory Visit (INDEPENDENT_AMBULATORY_CARE_PROVIDER_SITE_OTHER): Payer: 59 | Admitting: Family Medicine

## 2014-07-04 ENCOUNTER — Other Ambulatory Visit (HOSPITAL_COMMUNITY)
Admission: RE | Admit: 2014-07-04 | Discharge: 2014-07-04 | Disposition: A | Discharge status: Home / Self Care | Payer: 59 | Source: Ambulatory Visit | Attending: Family Medicine | Admitting: Family Medicine

## 2014-07-04 VITALS — BP 112/78 | HR 72 | Resp 18 | Ht 63.0 in | Wt 155.0 lb

## 2014-07-04 DIAGNOSIS — Z801 Family history of malignant neoplasm of trachea, bronchus and lung: Secondary | ICD-10-CM

## 2014-07-04 DIAGNOSIS — E8881 Metabolic syndrome: Secondary | ICD-10-CM

## 2014-07-04 DIAGNOSIS — Z01419 Encounter for gynecological examination (general) (routine) without abnormal findings: Secondary | ICD-10-CM | POA: Insufficient documentation

## 2014-07-04 DIAGNOSIS — Z124 Encounter for screening for malignant neoplasm of cervix: Secondary | ICD-10-CM

## 2014-07-04 DIAGNOSIS — E663 Overweight: Secondary | ICD-10-CM

## 2014-07-04 DIAGNOSIS — Z6379 Other stressful life events affecting family and household: Secondary | ICD-10-CM

## 2014-07-04 DIAGNOSIS — Z1211 Encounter for screening for malignant neoplasm of colon: Secondary | ICD-10-CM

## 2014-07-04 DIAGNOSIS — Z1239 Encounter for other screening for malignant neoplasm of breast: Secondary | ICD-10-CM

## 2014-07-04 DIAGNOSIS — E042 Nontoxic multinodular goiter: Secondary | ICD-10-CM

## 2014-07-04 DIAGNOSIS — R7309 Other abnormal glucose: Secondary | ICD-10-CM

## 2014-07-04 DIAGNOSIS — E041 Nontoxic single thyroid nodule: Secondary | ICD-10-CM

## 2014-07-04 DIAGNOSIS — Z0000 Encounter for general adult medical examination without abnormal findings: Secondary | ICD-10-CM

## 2014-07-04 DIAGNOSIS — F17200 Nicotine dependence, unspecified, uncomplicated: Secondary | ICD-10-CM

## 2014-07-04 DIAGNOSIS — Z1231 Encounter for screening mammogram for malignant neoplasm of breast: Secondary | ICD-10-CM

## 2014-07-04 DIAGNOSIS — E785 Hyperlipidemia, unspecified: Secondary | ICD-10-CM

## 2014-07-04 LAB — POC HEMOCCULT BLD/STL (OFFICE/1-CARD/DIAGNOSTIC): Fecal Occult Blood, POC: NEGATIVE

## 2014-07-04 NOTE — Patient Instructions (Addendum)
F/u early December, call if you need me before  You will have mammogram , thyroid US and chest scan scheduled as discussed we will attempt to get them on the same day if possible  Pls change diet and start exercising agin. I am aware of the stress you have been under and I am thankful that your Mom is improving   Fasting lipid, cmp and HBA1C and TSH in December  No change in medication  pls call to schedule your colonoscopy before the end of the year, this is past due  You will get info on smoking cessation classes, also please call [facility] for assistance

## 2014-07-04 NOTE — Progress Notes (Signed)
Subjective:    Patient ID: Andrea Simon, female    DOB: October 26, 1956, 58 y.o.   MRN: [id number]  HPI Patient is in for annual physical exam. She has had increased stress of dealing with her mother's diagnosis of lung cancer, has not been able to remain focussed  On her own health needs and continueing the smoking cessation class at the health dept. No regular exercise and overindulgence in ice cream has reulted in deterioration of her chronic health issues also. Labs and radiology data are reviewed and necessary referrals and advice given re changes in behavior with goals set for next 6 month Review of Systems See HPI     Objective:   Physical Exam Pleasant overweight  female, alert and oriented x 3, in no cardio-pulmonary distress. Afebrile. HEENT No facial trauma or asymetry. Sinuses non tender.  EOMI, PERTL, fundoscopic exam  no hemorhage or exudate.  External ears normal, tympanic membranes clear. Oropharynx moist, no exudate, good dentition. Neck: supple, no adenopathy,JVD .No bruits.Thyromegaly present  Chest: Clear to ascultation bilaterally.No crackles or wheezes. Non tender to palpation  Breast: No asymetry,no masses or lumps. No tenderness. No nipple discharge or inversion. No axillary or supraclavicular adenopathy  Cardiovascular system; Heart sounds normal,  S1 and  S2 ,no S3.  No murmur, or thrill. Apical beat not displaced Peripheral pulses normal.  Abdomen: Soft, non tender, no organomegaly or masses. No bruits. Bowel sounds normal. No guarding, tenderness or rebound.  Rectal:  Normal sphincter tone. No mass.No rectal masses.  Guaiac negative stool.  GU: External genitalia normal female genitalia , female distribution of hair. No lesions. Urethral meatus normal in size, no  Prolapse, no lesions visibly  Present. Bladder non tender. Vagina pink and moist , with no visible lesions , discharge present . Adequate pelvic support no  cystocele or  rectocele noted Cervix pink and appears healthy, no lesions or ulcerations noted, no discharge noted from os Uterus normal size, no adnexal masses, no cervical motion or adnexal tenderness.   Musculoskeletal exam: Full ROM of spine, hips , shoulders and knees. No deformity ,swelling or crepitus noted. No muscle wasting or atrophy.   Neurologic: Cranial nerves 2 to 12 intact. Power, tone ,sensation and reflexes normal throughout. No disturbance in gait. No tremor.  Skin: Intact, no ulceration, erythema , scaling or rash noted. Pigmentation normal throughout  Psych; Normal mood and slightly depressed   affect. Judgement and concentration normal        Assessment & Plan:  Routine general medical examination at a health care facility Annual exam as documented. Counseling done  re healthy lifestyle involving commitment to 150 minutes exercise per week, heart healthy diet, and attaining healthy weight.The importance of adequate sleep also discussed. Regular seat belt use  is also discussed. Changes in health habits are decided on by the patient with goals and time frames  set for achieving them. Immunization and cancer screening needs are specifically addressed at this visit.Pt behind in mammogram and colonoscopy, still electing to put off colonoscopy at thsi time, currently overwhelmed with family health issues, encouraged  Strongly to have this done    NICOTINE ADDICTION Ongoing nicotine use despite her desire to quit, worsened , due to stress of illness in her mother battling lung cancer currently Patient counseled for approximately 5 minutes regarding the health risks of ongoing nicotine use, specifically all types of cancer, heart disease, stroke and respiratory failure. The options available for help with cessation ,the behavioral changes to  assist the process, and the option to either gradully reduce usage  Or abruptly stop.is also discussed. Pt is also encouraged to set  specific goals in number of cigarettes used daily, as well as to set a quit date. Pt has an over 30 year pack year history, screening for lung cancer is indicated and she agrees to this  HYPERLIPIDEMIA Improved, pt applauded on this , continue dietary management only Hyperlipidemia:Low fat diet discussed and encouraged. Updated lab needed at/ before next visit.     PRE-DIABETES Deteriorated, was 5.9 in 1`01/2013 Patient educated about the importance of limiting  Carbohydrate intake , the need to commit to daily physical activity for a minimum of 30 minutes , and to commit weight loss. The fact that changes in all these areas will reduce or eliminate all together the development of diabetes is stressed.     Overweight (BMI 25.0-29.9) Deteriorated. Patient re-educated about  the importance of commitment to a  minimum of 150 minutes of exercise per week. The importance of healthy food choices with portion control discussed. Encouraged to start a food diary, count calories and to consider  joining a support group. Sample diet sheets offered. Goals set by the patient for the next several months.     Metabolic syndrome X Increased risk of heart disease, multiple risk factors which also include nicotine use. Pt will try to focus on changes needed to improve her health  Stress due to illness of family member Increased stress due to dx of lung cancer in her mother, she has 1 sibling and they are supportive of each other also other family members involved, generally feels overwhelmed and is anxious about her one remaining parent ,  No interest in therapy at this time.

## 2014-07-06 LAB — CYTOLOGY - PAP

## 2014-07-08 ENCOUNTER — Encounter: Payer: Self-pay | Admitting: Family Medicine

## 2014-07-08 DIAGNOSIS — E663 Overweight: Secondary | ICD-10-CM | POA: Insufficient documentation

## 2014-07-08 DIAGNOSIS — E042 Nontoxic multinodular goiter: Secondary | ICD-10-CM | POA: Insufficient documentation

## 2014-07-08 DIAGNOSIS — Z6379 Other stressful life events affecting family and household: Secondary | ICD-10-CM | POA: Insufficient documentation

## 2014-07-08 NOTE — Assessment & Plan Note (Signed)
Deteriorated, was 5.9 in 1`01/2013 Patient educated about the importance of limiting  Carbohydrate intake , the need to commit to daily physical activity for a minimum of 30 minutes , and to commit weight loss. The fact that changes in all these areas will reduce or eliminate all together the development of diabetes is stressed.

## 2014-07-08 NOTE — Assessment & Plan Note (Addendum)
Improved, pt applauded on this , continue dietary management only Hyperlipidemia:Low fat diet discussed and encouraged. Updated lab needed at/ before next visit.

## 2014-07-08 NOTE — Assessment & Plan Note (Signed)
Increased risk of heart disease, multiple risk factors which also include nicotine use. Pt will try to focus on changes needed to improve her health

## 2014-07-08 NOTE — Assessment & Plan Note (Signed)
Increased stress due to dx of lung cancer in her mother, she has 1 sibling and they are supportive of each other also other family members involved, generally feels overwhelmed and is anxious about her one remaining parent ,  No interest in therapy at this time.

## 2014-07-08 NOTE — Assessment & Plan Note (Signed)
Deteriorated. Patient re-educated about  the importance of commitment to a  minimum of 150 minutes of exercise per week. The importance of healthy food choices with portion control discussed. Encouraged to start a food diary, count calories and to consider  joining a support group. Sample diet sheets offered. Goals set by the patient for the next several months.    

## 2014-07-08 NOTE — Assessment & Plan Note (Signed)
Annual exam as documented. Counseling done  re healthy lifestyle involving commitment to 150 minutes exercise per week, heart healthy diet, and attaining healthy weight.The importance of adequate sleep also discussed. Regular seat belt use  is also discussed. Changes in health habits are decided on by the patient with goals and time frames  set for achieving them. Immunization and cancer screening needs are specifically addressed at this visit.Pt behind in mammogram and colonoscopy, still electing to put off colonoscopy at thsi time, currently overwhelmed with family health issues, encouraged  Strongly to have this done

## 2014-07-08 NOTE — Assessment & Plan Note (Signed)
Ongoing nicotine use despite her desire to quit, worsened , due to stress of illness in her mother battling lung cancer currently Patient counseled for approximately 5 minutes regarding the health risks of ongoing nicotine use, specifically all types of cancer, heart disease, stroke and respiratory failure. The options available for help with cessation ,the behavioral changes to assist the process, and the option to either gradully reduce usage  Or abruptly stop.is also discussed. Pt is also encouraged to set specific goals in number of cigarettes used daily, as well as to set a quit date. Pt has an over 30 year pack year history, screening for lung cancer is indicated and she agrees to this

## 2014-07-10 ENCOUNTER — Other Ambulatory Visit: Payer: Self-pay | Admitting: Family Medicine

## 2014-07-10 DIAGNOSIS — Z801 Family history of malignant neoplasm of trachea, bronchus and lung: Secondary | ICD-10-CM

## 2014-07-10 DIAGNOSIS — F17200 Nicotine dependence, unspecified, uncomplicated: Secondary | ICD-10-CM

## 2014-07-10 NOTE — Addendum Note (Signed)
Addended by: Denman George B on: 07/10/2014 08:51 AM   Modules accepted: Orders

## 2014-07-16 ENCOUNTER — Ambulatory Visit (HOSPITAL_COMMUNITY)
Admission: RE | Admit: 2014-07-16 | Discharge: 2014-07-16 | Disposition: A | Discharge status: Home / Self Care | Payer: 59 | Source: Ambulatory Visit | Attending: Family Medicine | Admitting: Family Medicine

## 2014-07-16 ENCOUNTER — Ambulatory Visit (HOSPITAL_COMMUNITY): Payer: 59

## 2014-07-16 DIAGNOSIS — Z1231 Encounter for screening mammogram for malignant neoplasm of breast: Secondary | ICD-10-CM

## 2014-07-16 DIAGNOSIS — E042 Nontoxic multinodular goiter: Secondary | ICD-10-CM | POA: Insufficient documentation

## 2014-10-13 ENCOUNTER — Other Ambulatory Visit: Payer: Self-pay | Admitting: Family Medicine

## 2015-05-03 ENCOUNTER — Other Ambulatory Visit: Payer: Self-pay | Admitting: Family Medicine

## 2015-05-03 DIAGNOSIS — R7303 Prediabetes: Secondary | ICD-10-CM

## 2015-05-03 DIAGNOSIS — E785 Hyperlipidemia, unspecified: Secondary | ICD-10-CM

## 2015-05-03 LAB — COMPREHENSIVE METABOLIC PANEL
ALK PHOS: 79 U/L (ref 39–117)
ALT: 31 U/L (ref 0–35)
AST: 27 U/L (ref 0–37)
Albumin: 4.3 g/dL (ref 3.5–5.2)
BUN: 10 mg/dL (ref 6–23)
CALCIUM: 9.3 mg/dL (ref 8.4–10.5)
CHLORIDE: 103 meq/L (ref 96–112)
CO2: 28 mEq/L (ref 19–32)
Creat: 0.81 mg/dL (ref 0.50–1.10)
Glucose, Bld: 113 mg/dL — ABNORMAL HIGH (ref 70–99)
POTASSIUM: 3.9 meq/L (ref 3.5–5.3)
Sodium: 140 mEq/L (ref 135–145)
Total Bilirubin: 0.8 mg/dL (ref 0.2–1.2)
Total Protein: 7.1 g/dL (ref 6.0–8.3)

## 2015-05-03 LAB — LIPID PANEL
CHOLESTEROL: 220 mg/dL — AB (ref 0–200)
HDL: 35 mg/dL — ABNORMAL LOW (ref >=46)
LDL Cholesterol: 154 mg/dL — ABNORMAL HIGH (ref 0–99)
TRIGLYCERIDES: 155 mg/dL — AB (ref <150)
Total CHOL/HDL Ratio: 6.3 Ratio
VLDL: 31 mg/dL (ref 0–40)

## 2015-05-03 LAB — HEMOGLOBIN A1C
HEMOGLOBIN A1C: 6.5 % — AB (ref <5.7)
MEAN PLASMA GLUCOSE: 140 mg/dL — AB (ref <117)

## 2015-05-03 LAB — TSH: TSH: 1.721 u[IU]/mL (ref 0.350–4.500)

## 2015-05-06 ENCOUNTER — Encounter: Payer: Self-pay | Admitting: Family Medicine

## 2015-05-06 ENCOUNTER — Ambulatory Visit (INDEPENDENT_AMBULATORY_CARE_PROVIDER_SITE_OTHER): Payer: 59 | Admitting: Family Medicine

## 2015-05-06 VITALS — BP 120/76 | HR 78 | Resp 16 | Ht 63.0 in | Wt 155.0 lb

## 2015-05-06 DIAGNOSIS — Z720 Tobacco use: Secondary | ICD-10-CM | POA: Diagnosis not present

## 2015-05-06 DIAGNOSIS — E663 Overweight: Secondary | ICD-10-CM | POA: Diagnosis not present

## 2015-05-06 DIAGNOSIS — E785 Hyperlipidemia, unspecified: Secondary | ICD-10-CM

## 2015-05-06 DIAGNOSIS — E8881 Metabolic syndrome: Secondary | ICD-10-CM | POA: Diagnosis not present

## 2015-05-06 DIAGNOSIS — F17200 Nicotine dependence, unspecified, uncomplicated: Secondary | ICD-10-CM

## 2015-05-06 DIAGNOSIS — R7309 Other abnormal glucose: Secondary | ICD-10-CM | POA: Diagnosis not present

## 2015-05-06 DIAGNOSIS — R7303 Prediabetes: Secondary | ICD-10-CM

## 2015-05-06 NOTE — Patient Instructions (Addendum)
Annual physical exam in September , call if you need me before  You will be contacted with l;ab results from Friday  Please continue to want to quit smoking and work on this  Call [facility]

## 2015-05-06 NOTE — Progress Notes (Signed)
Subjective:    Patient ID: Andrea Simon, female    DOB: 1956-09-28, 58 y.o.   MRN: [id number]  HPI The PT is here for follow up and re-evaluation of chronic medical conditions, medication management and review of any available recent lab and radiology data.  Preventive health is updated, specifically  Cancer screening and Immunization.    The PT denies any adverse reactions to current medications since the last visit.  There are no new concerns.  There are no specific complaints       Review of Systems See HPI Denies recent fever or chills. Denies sinus pressure, nasal congestion, ear pain or sore throat. Denies chest congestion, productive cough or wheezing. Denies chest pains, palpitations and leg swelling Denies abdominal pain, nausea, vomiting,diarrhea or constipation.   Denies dysuria, frequency, hesitancy or incontinence. Denies joint pain, swelling and limitation in mobility. Denies headaches, seizures, numbness, or tingling. Denies uncontrolled  depression, anxiety or insomnia. Denies skin break down or rash.        Objective:   Physical Exam BP 120/76 mmHg  Pulse 78  Resp 16  Ht 5\' 3"  (1.6 m)  Wt 155 lb (70.308 kg)  BMI 27.46 kg/m2  SpO2 99% Patient alert and oriented and in no cardiopulmonary distress.  HEENT: No facial asymmetry, EOMI,   oropharynx pink and moist.  Neck supple no JVD, no mass.  Chest: Clear to auscultation bilaterally.  CVS: S1, S2 no murmurs, no S3.Regular rate.  ABD: Soft non tender.   Ext: No edema  MS: Adequate ROM spine, shoulders, hips and knees.  Skin: Intact, no ulcerations or rash noted.  Psych: Good eye contact, normal affect. Memory intact not anxious or depressed appearing.  CNS: CN 2-12 intact, power,  normal throughout.no focal deficits noted.        Assessment & Plan:  Hyperlipemia UnControlled, medication compliance as well as dietary change needed Hyperlipidemia:Low fat diet discussed and  encouraged.   Lipid Panel  Lab Results  Component Value Date   CHOL 220* 05/03/2015   HDL 35* 05/03/2015   LDLCALC 154* 05/03/2015   TRIG 155* 05/03/2015   CHOLHDL 6.3 05/03/2015        Prediabetes Patient educated about the importance of limiting  Carbohydrate intake , the need to commit to daily physical activity for a minimum of 30 minutes , and to commit weight loss. The fact that changes in all these areas will reduce or eliminate all together the development of diabetes is stressed.   Diabetic Labs Latest Ref Rng 05/03/2015 07/02/2014 12/05/2013 06/19/2013 12/17/2012  HbA1c <5.7 % 6.5(H) 6.2(H) 5.9(H) 6.1(H) 6.0(H)  Chol 0 - 200 mg/dL 220(H) 188 187 192 -  HDL >=46 mg/dL 35(L) 31(L) 39(L) 36(L) -  Calc LDL 0 - 99 mg/dL 154(H) 100(H) 122(H) 128(H) -  Triglycerides <150 mg/dL 155(H) 285(H) 131 142 -  Creatinine 0.50 - 1.10 mg/dL 0.81 0.85 - - 0.82   BP/Weight 05/06/2015 07/04/2014 12/05/2013 08/29/2013 06/20/2013 12/23/2012 4/48/1856  Systolic BP 314 970 263 785 885 027 741  Diastolic BP 76 78 64 85 72 74 76  Wt. (Lbs) 155 155 134.04 - 143.04 142 141.04  BMI 27.46 27.46 23.75 - 25.34 24.76 24.59   No flowsheet data found.     Metabolic syndrome X The increased risk of cardiovascular disease associated with this diagnosis, and the need to consistently work on lifestyle to change this is discussed. Following  a  heart healthy diet ,commitment to 30 minutes of exercise  at least 5 days per week, as well as control of blood sugar and cholesterol , and achieving a healthy weight are all the areas to be addressed .   Overweight (BMI 25.0-29.9) Deteriorated. Patient re-educated about  the importance of commitment to a  minimum of 150 minutes of exercise per week.  The importance of healthy food choices with portion control discussed. Encouraged to start a food diary, count calories and to consider  joining a support group. Sample diet sheets offered. Goals set by the patient for  the next several months.   Weight /BMI 05/06/2015 07/04/2014 12/05/2013  WEIGHT 155 lb 155 lb 134 lb 0.6 oz  HEIGHT 5\' 3"  5\' 3"  5\' 3"   BMI 27.46 kg/m2 27.46 kg/m2 23.75 kg/m2    Current exercise per week 100 minutes.   NICOTINE ADDICTION Patient counseled for approximately 5 minutes regarding the health risks of ongoing nicotine use, specifically all types of cancer, heart disease, stroke and respiratory failure. The options available for help with cessation ,the behavioral changes to assist the process, and the option to either gradully reduce usage  Or abruptly stop.is also discussed. Pt is also encouraged to set specific goals in number of cigarettes used daily, as well as to set a quit date.  Number of cigarettes/cigars currently smoking daily: 7 . Wants to quit but unwilling to set a date   Cholelithiasis Asymptomatic, no surgery planned

## 2015-05-07 ENCOUNTER — Other Ambulatory Visit: Payer: Self-pay | Admitting: Family Medicine

## 2015-05-08 ENCOUNTER — Other Ambulatory Visit: Payer: Self-pay

## 2015-05-08 MED ORDER — PRAVASTATIN SODIUM 40 MG PO TABS: ORAL_TABLET | ORAL | Status: DC

## 2015-05-08 MED ORDER — METFORMIN HCL 500 MG PO TABS: 500.0000 mg | ORAL_TABLET | Freq: Every day | ORAL | Status: DC

## 2015-05-08 NOTE — Progress Notes (Signed)
Patient to resume pravastatin 40 and begin metformin 500 1 qd for her blood sugar

## 2015-05-29 ENCOUNTER — Encounter: Payer: Self-pay | Admitting: Family Medicine

## 2015-06-06 ENCOUNTER — Telehealth: Payer: Self-pay

## 2015-06-06 DIAGNOSIS — E785 Hyperlipidemia, unspecified: Secondary | ICD-10-CM

## 2015-06-06 DIAGNOSIS — R7303 Prediabetes: Secondary | ICD-10-CM

## 2015-06-06 NOTE — Telephone Encounter (Signed)
-----  Message from Fayrene Helper, MD sent at 05/31/2015 10:53 AM EDT ----- Please advise cholesterol is too high , and  so is the blood sugar Review values with patient  Please, comparing their value to the norm, so that they understand. Encourage commitment to daily exercise for 30 minutes and change in eating habit, as far as food choice and portion size  are concerned . Please remind them that making these changes will  correct these abnormal values  and improve their health. IF has been consistently taking pravachol 40 should inc dose to 80 mg , pls send in change. Now HBA1C number if repeated in 3 months gives her a dx of diabetes, NEEDS to improve this as best she is able, also offer starting metformin as a prediabetic 500 mg one daily, send in #90 refill 1 if she agrees, Needs fasting lipid, cmp and EGFR, hBa1C in 3 to 4 months

## 2015-07-12 NOTE — Assessment & Plan Note (Signed)
The increased risk of cardiovascular disease associated with this diagnosis, and the need to consistently work on lifestyle to change this is discussed. Following  a  heart healthy diet ,commitment to 30 minutes of exercise at least 5 days per week, as well as control of blood sugar and cholesterol , and achieving a healthy weight are all the areas to be addressed .  

## 2015-07-12 NOTE — Assessment & Plan Note (Signed)
Asymptomatic, no surgery planned

## 2015-07-12 NOTE — Assessment & Plan Note (Signed)
Patient educated about the importance of limiting  Carbohydrate intake , the need to commit to daily physical activity for a minimum of 30 minutes , and to commit weight loss. The fact that changes in all these areas will reduce or eliminate all together the development of diabetes is stressed.   Diabetic Labs Latest Ref Rng 05/03/2015 07/02/2014 12/05/2013 06/19/2013 12/17/2012  HbA1c <5.7 % 6.5(H) 6.2(H) 5.9(H) 6.1(H) 6.0(H)  Chol 0 - 200 mg/dL 220(H) 188 187 192 -  HDL >=46 mg/dL 35(L) 31(L) 39(L) 36(L) -  Calc LDL 0 - 99 mg/dL 154(H) 100(H) 122(H) 128(H) -  Triglycerides <150 mg/dL 155(H) 285(H) 131 142 -  Creatinine 0.50 - 1.10 mg/dL 0.81 0.85 - - 0.82   BP/Weight 05/06/2015 07/04/2014 12/05/2013 08/29/2013 06/20/2013 12/23/2012 6/57/8469  Systolic BP 629 528 413 244 010 272 536  Diastolic BP 76 78 64 85 72 74 76  Wt. (Lbs) 155 155 134.04 - 143.04 142 141.04  BMI 27.46 27.46 23.75 - 25.34 24.76 24.59   No flowsheet data found.

## 2015-07-12 NOTE — Assessment & Plan Note (Signed)
Deteriorated. Patient re-educated about  the importance of commitment to a  minimum of 150 minutes of exercise per week.  The importance of healthy food choices with portion control discussed. Encouraged to start a food diary, count calories and to consider  joining a support group. Sample diet sheets offered. Goals set by the patient for the next several months.   Weight /BMI 05/06/2015 07/04/2014 12/05/2013  WEIGHT 155 lb 155 lb 134 lb 0.6 oz  HEIGHT 5\' 3"  5\' 3"  5\' 3"   BMI 27.46 kg/m2 27.46 kg/m2 23.75 kg/m2    Current exercise per week 100 minutes.

## 2015-07-12 NOTE — Assessment & Plan Note (Signed)
UnControlled, medication compliance as well as dietary change needed Hyperlipidemia:Low fat diet discussed and encouraged.   Lipid Panel  Lab Results  Component Value Date   CHOL 220* 05/03/2015   HDL 35* 05/03/2015   LDLCALC 154* 05/03/2015   TRIG 155* 05/03/2015   CHOLHDL 6.3 05/03/2015

## 2015-07-12 NOTE — Assessment & Plan Note (Signed)
Patient counseled for approximately 5 minutes regarding the health risks of ongoing nicotine use, specifically all types of cancer, heart disease, stroke and respiratory failure. The options available for help with cessation ,the behavioral changes to assist the process, and the option to either gradully reduce usage  Or abruptly stop.is also discussed. Pt is also encouraged to set specific goals in number of cigarettes used daily, as well as to set a quit date.  Number of cigarettes/cigars currently smoking daily: 7 . Wants to quit but unwilling to set a date

## 2015-09-10 ENCOUNTER — Ambulatory Visit (INDEPENDENT_AMBULATORY_CARE_PROVIDER_SITE_OTHER): Payer: 59 | Admitting: Family Medicine

## 2015-09-10 ENCOUNTER — Other Ambulatory Visit (HOSPITAL_COMMUNITY)
Admission: RE | Admit: 2015-09-10 | Discharge: 2015-09-10 | Disposition: A | Discharge status: Home / Self Care | Payer: 59 | Source: Ambulatory Visit | Attending: Family Medicine | Admitting: Family Medicine

## 2015-09-10 VITALS — BP 134/78 | HR 72 | Resp 18 | Ht 63.0 in | Wt 153.1 lb

## 2015-09-10 DIAGNOSIS — Z01419 Encounter for gynecological examination (general) (routine) without abnormal findings: Secondary | ICD-10-CM | POA: Insufficient documentation

## 2015-09-10 DIAGNOSIS — Z23 Encounter for immunization: Secondary | ICD-10-CM | POA: Diagnosis not present

## 2015-09-10 DIAGNOSIS — E785 Hyperlipidemia, unspecified: Secondary | ICD-10-CM

## 2015-09-10 DIAGNOSIS — Z1159 Encounter for screening for other viral diseases: Secondary | ICD-10-CM

## 2015-09-10 DIAGNOSIS — Z124 Encounter for screening for malignant neoplasm of cervix: Secondary | ICD-10-CM

## 2015-09-10 DIAGNOSIS — Z1211 Encounter for screening for malignant neoplasm of colon: Secondary | ICD-10-CM

## 2015-09-10 DIAGNOSIS — F17200 Nicotine dependence, unspecified, uncomplicated: Secondary | ICD-10-CM

## 2015-09-10 DIAGNOSIS — Z114 Encounter for screening for human immunodeficiency virus [HIV]: Secondary | ICD-10-CM

## 2015-09-10 DIAGNOSIS — R7309 Other abnormal glucose: Secondary | ICD-10-CM

## 2015-09-10 DIAGNOSIS — R7303 Prediabetes: Secondary | ICD-10-CM

## 2015-09-10 DIAGNOSIS — Z0000 Encounter for general adult medical examination without abnormal findings: Secondary | ICD-10-CM | POA: Diagnosis not present

## 2015-09-10 DIAGNOSIS — Z720 Tobacco use: Secondary | ICD-10-CM

## 2015-09-10 LAB — POC HEMOCCULT BLD/STL (OFFICE/1-CARD/DIAGNOSTIC): FECAL OCCULT BLD: NEGATIVE

## 2015-09-10 NOTE — Progress Notes (Signed)
   Subjective:    Patient ID: Andrea Simon, female    DOB: 1956/07/21, 59 y.o.   MRN: [id number]  HPI  Patient is in for annual physical exam. No other health concerns are expressed  at the visit. Recent labs, if available are reviewed. Immunization is reviewed , and  updated if needed.   Review of Systems See HPI     Objective:   Physical Exam  BP 134/78 mmHg  Pulse 72  Resp 18  Ht 5\' 3"  (1.6 m)  Wt 153 lb 1.9 oz (69.455 kg)  BMI 27.13 kg/m2  SpO2 99% Pleasant well nourished female, alert and oriented x 3, in no cardio-pulmonary distress. Afebrile. HEENT No facial trauma or asymetry. Sinuses non tender.  Extra occullar muscles intact, pupils equally reactive to light. External ears normal, tympanic membranes clear. Oropharynx moist, no exudate, good dentition. Neck: supple, no adenopathy,JVD or thyromegaly.No bruits.  Chest: Clear to ascultation bilaterally.No crackles or wheezes. Non tender to palpation  Breast: No asymetry,no masses or lumps. No tenderness. No nipple discharge or inversion. No axillary or supraclavicular adenopathy  Cardiovascular system; Heart sounds normal,  S1 and  S2 ,no S3.  No murmur, or thrill. Apical beat not displaced Peripheral pulses normal.  Abdomen: Soft, non tender, no organomegaly or masses. No bruits. Bowel sounds normal. No guarding, tenderness or rebound.  Rectal:  Normal sphincter tone. No mass.No rectal masses.  Guaiac negative stool.  GU: External genitalia normal female genitalia , female distribution of hair. No lesions. Urethral meatus normal in size, no  Prolapse, no lesions visibly  Present. Bladder non tender. Vagina pink and moist , with no visible lesions , discharge present . Adequate pelvic support no  cystocele or rectocele noted Cervix pink and appears healthy, no lesions or ulcerations noted, no discharge noted from os Uterus normal size, no adnexal masses, no cervical motion or adnexal  tenderness.   Musculoskeletal exam: Full ROM of spine, hips , shoulders and knees. No deformity ,swelling or crepitus noted. No muscle wasting or atrophy.   Neurologic: Cranial nerves 2 to 12 intact. Power, tone ,sensation and reflexes normal throughout. No disturbance in gait. No tremor.  Skin: Intact, no ulceration, erythema , scaling or rash noted. Pigmentation normal throughout  Psych; Normal mood and affect. Judgement and concentration normal       Assessment & Plan:  Annual physical exam Annual exam as documented. Counseling done  re healthy lifestyle involving commitment to 150 minutes exercise per week, heart healthy diet, and attaining healthy weight.The importance of adequate sleep also discussed. Regular seat belt use and home safety, is also discussed. Changes in health habits are decided on by the patient with goals and time frames  set for achieving them. Immunization and cancer screening needs are specifically addressed at this visit.   Need for prophylactic vaccination and inoculation against influenza After obtaining informed consent, the vaccine is  administered by LPN.   NICOTINE ADDICTION Patient counseled for approximately 5 minutes regarding the health risks of ongoing nicotine use, specifically all types of cancer, heart disease, stroke and respiratory failure. The options available for help with cessation ,the behavioral changes to assist the process, and the option to either gradully reduce usage  Or abruptly stop.is also discussed. Pt is also encouraged to set specific goals in number of cigarettes used daily, as well as to set a quit date.  Number of cigarettes/cigars currently smoking daily: 10 to 12

## 2015-09-10 NOTE — Patient Instructions (Signed)
F/u in 4.5 month, call if you need me before  Flu vaccine today  Please work on stopping smoking , handout provided for your help with this   Fasting labs this week please We will attempt to get info as to how to make the chest screening scan affordable, nurse will speak wih you about this  You DO NEED your mammogram and colonscopy, pls schedule mammogram and you will again be referred for colonoscopy since you state you will get this done  Please work on good  health habits so that your health will improve. 1. Commitment to daily physical activity for 30 to 60  minutes, if you are able to do this.  2. Commitment to wise food choices. Aim for half of your  food intake to be vegetable and fruit, one quarter starchy foods, and one quarter protein. Try to eat on a regular schedule  3 meals per day, snacking between meals should be limited to vegetables or fruits or small portions of nuts. 64 ounces of water per day is generally recommended, unless you have specific health conditions, like heart failure or kidney failure where you will need to limit fluid intake.  3. Commitment to sufficient and a  good quality of physical and mental rest daily, generally between 6 to 8 hours per day.  WITH PERSISTANCE AND PERSEVERANCE, THE IMPOSSIBLE , BECOMES THE NORM!  Thanks for choosing [facility], we consider it a privelige to serve you.

## 2015-09-11 LAB — CBC
HEMATOCRIT: 40.2 % (ref 36.0–46.0)
HEMOGLOBIN: 13.9 g/dL (ref 12.0–15.0)
MCH: 33.5 pg (ref 26.0–34.0)
MCHC: 34.6 g/dL (ref 30.0–36.0)
MCV: 96.9 fL (ref 78.0–100.0)
MPV: 9.8 fL (ref 8.6–12.4)
Platelets: 213 10*3/uL (ref 150–400)
RBC: 4.15 MIL/uL (ref 3.87–5.11)
RDW: 14.1 % (ref 11.5–15.5)
WBC: 4.7 10*3/uL (ref 4.0–10.5)

## 2015-09-11 LAB — COMPLETE METABOLIC PANEL WITH GFR
ALBUMIN: 4.4 g/dL (ref 3.6–5.1)
ALK PHOS: 69 U/L (ref 33–130)
ALT: 29 U/L (ref 6–29)
AST: 23 U/L (ref 10–35)
BILIRUBIN TOTAL: 1 mg/dL (ref 0.2–1.2)
BUN: 10 mg/dL (ref 7–25)
CO2: 27 mmol/L (ref 20–31)
Calcium: 9.3 mg/dL (ref 8.6–10.4)
Chloride: 104 mmol/L (ref 98–110)
Creat: 0.81 mg/dL (ref 0.50–1.05)
GFR, EST NON AFRICAN AMERICAN: 81 mL/min (ref >=60)
GLUCOSE: 102 mg/dL — AB (ref 65–99)
POTASSIUM: 3.8 mmol/L (ref 3.5–5.3)
SODIUM: 139 mmol/L (ref 135–146)
TOTAL PROTEIN: 6.7 g/dL (ref 6.1–8.1)

## 2015-09-11 LAB — LIPID PANEL
CHOL/HDL RATIO: 6.4 ratio — AB (ref <=5.0)
CHOLESTEROL: 191 mg/dL (ref 125–200)
HDL: 30 mg/dL — AB (ref >=46)
LDL Cholesterol: 122 mg/dL (ref <130)
TRIGLYCERIDES: 196 mg/dL — AB (ref <150)
VLDL: 39 mg/dL — ABNORMAL HIGH (ref <30)

## 2015-09-11 LAB — HEMOGLOBIN A1C
Hgb A1c MFr Bld: 6.6 % — ABNORMAL HIGH (ref <5.7)
Mean Plasma Glucose: 143 mg/dL — ABNORMAL HIGH (ref <117)

## 2015-09-12 LAB — HEPATITIS C ANTIBODY: HCV Ab: NEGATIVE

## 2015-09-12 LAB — TSH: TSH: 1.186 u[IU]/mL (ref 0.350–4.500)

## 2015-09-12 LAB — HIV ANTIBODY (ROUTINE TESTING W REFLEX): HIV: NONREACTIVE

## 2015-09-12 LAB — CYTOLOGY - PAP

## 2015-09-14 ENCOUNTER — Encounter: Payer: Self-pay | Admitting: Family Medicine

## 2015-09-14 DIAGNOSIS — Z0000 Encounter for general adult medical examination without abnormal findings: Secondary | ICD-10-CM | POA: Insufficient documentation

## 2015-09-14 DIAGNOSIS — Z23 Encounter for immunization: Secondary | ICD-10-CM | POA: Insufficient documentation

## 2015-09-14 NOTE — Assessment & Plan Note (Signed)

## 2015-09-14 NOTE — Assessment & Plan Note (Signed)
After obtaining informed consent, the vaccine is  administered by LPN.  

## 2015-09-14 NOTE — Assessment & Plan Note (Signed)
Patient counseled for approximately 5 minutes regarding the health risks of ongoing nicotine use, specifically all types of cancer, heart disease, stroke and respiratory failure. The options available for help with cessation ,the behavioral changes to assist the process, and the option to either gradully reduce usage  Or abruptly stop.is also discussed. Pt is also encouraged to set specific goals in number of cigarettes used daily, as well as to set a quit date.  Number of cigarettes/cigars currently smoking daily: 10 to 12  

## 2015-10-07 ENCOUNTER — Other Ambulatory Visit: Payer: Self-pay | Admitting: Family Medicine

## 2015-10-15 ENCOUNTER — Encounter: Payer: 59 | Admitting: Family Medicine

## 2015-12-03 LAB — HM DIABETES EYE EXAM

## 2016-01-09 ENCOUNTER — Telehealth: Payer: Self-pay

## 2016-01-09 DIAGNOSIS — R7303 Prediabetes: Secondary | ICD-10-CM

## 2016-01-09 LAB — COMPLETE METABOLIC PANEL WITH GFR
ALT: 23 U/L (ref 6–29)
AST: 24 U/L (ref 10–35)
Albumin: 4.5 g/dL (ref 3.6–5.1)
Alkaline Phosphatase: 65 U/L (ref 33–130)
BILIRUBIN TOTAL: 1 mg/dL (ref 0.2–1.2)
BUN: 9 mg/dL (ref 7–25)
CALCIUM: 10 mg/dL (ref 8.6–10.4)
CHLORIDE: 104 mmol/L (ref 98–110)
CO2: 27 mmol/L (ref 20–31)
CREATININE: 0.71 mg/dL (ref 0.50–1.05)
Glucose, Bld: 103 mg/dL — ABNORMAL HIGH (ref 65–99)
POTASSIUM: 4.2 mmol/L (ref 3.5–5.3)
Sodium: 140 mmol/L (ref 135–146)
Total Protein: 7.1 g/dL (ref 6.1–8.1)

## 2016-01-09 NOTE — Telephone Encounter (Signed)
Labs ordered for [facility] and [facility]

## 2016-01-10 LAB — HEMOGLOBIN A1C
HEMOGLOBIN A1C: 6.1 % — AB (ref <5.7)
Mean Plasma Glucose: 128 mg/dL — ABNORMAL HIGH (ref <117)

## 2016-01-11 ENCOUNTER — Other Ambulatory Visit: Payer: Self-pay | Admitting: Family Medicine

## 2016-01-13 ENCOUNTER — Ambulatory Visit (INDEPENDENT_AMBULATORY_CARE_PROVIDER_SITE_OTHER): Payer: 59 | Admitting: Family Medicine

## 2016-01-13 ENCOUNTER — Encounter: Payer: Self-pay | Admitting: Family Medicine

## 2016-01-13 VITALS — BP 120/74 | HR 72 | Resp 18 | Ht 63.0 in | Wt 144.0 lb

## 2016-01-13 DIAGNOSIS — F17200 Nicotine dependence, unspecified, uncomplicated: Secondary | ICD-10-CM

## 2016-01-13 DIAGNOSIS — R7303 Prediabetes: Secondary | ICD-10-CM

## 2016-01-13 DIAGNOSIS — E119 Type 2 diabetes mellitus without complications: Secondary | ICD-10-CM | POA: Diagnosis not present

## 2016-01-13 DIAGNOSIS — Z6379 Other stressful life events affecting family and household: Secondary | ICD-10-CM | POA: Diagnosis not present

## 2016-01-13 DIAGNOSIS — E663 Overweight: Secondary | ICD-10-CM

## 2016-01-13 DIAGNOSIS — E785 Hyperlipidemia, unspecified: Secondary | ICD-10-CM

## 2016-01-13 NOTE — Patient Instructions (Signed)
F/U in 5 months, call if you need me before  Pls complete mammogram and colonscopy  You are encourage to work on quitting smoking, get all the help you need  CONGRATS on improvement  Thanks for choosing [facility], we consider it a privelige to serve you.   Fasting labs in 5 montn

## 2016-01-13 NOTE — Progress Notes (Signed)
Subjective:    Patient ID: Andrea Simon, female    DOB: Nov 19, 1956, 60 y.o.   MRN: [id number]  HPI   Andrea Simon     MRN: [id number]      DOB: 12-16-1956   HPI Andrea Simon is here for follow up and re-evaluation of chronic medical conditions, medication management and review of any available recent lab and radiology data.  Preventive health is updated, specifically  Cancer screening and Immunization.   Questions or concerns regarding consultations or procedures which the PT has had in the interim are  addressed. The PT denies any adverse reactions to current medications since the last visit.  There are no new concerns.  There are no specific complaints   ROS Denies recent fever or chills. Denies sinus pressure, nasal congestion, ear pain or sore throat. Denies chest congestion, productive cough or wheezing. Denies chest pains, palpitations and leg swelling Denies abdominal pain, nausea, vomiting,diarrhea or constipation.   Denies dysuria, frequency, hesitancy or incontinence. Denies joint pain, swelling and limitation in mobility. Denies headaches, seizures, numbness, or tingling. Denies depression, anxiety or insomnia. Denies skin break down or rash.   PE  BP 120/74 mmHg  Pulse 72  Resp 18  Ht 5\' 3"  (1.6 m)  Wt 144 lb (65.318 kg)  BMI 25.51 kg/m2  SpO2 98%  Patient alert and oriented and in no cardiopulmonary distress.  HEENT: No facial asymmetry, EOMI,   oropharynx pink and moist.  Neck supple no JVD, no mass.  Chest: Clear to auscultation bilaterally.  CVS: S1, S2 no murmurs, no S3.Regular rate.  ABD: Soft non tender.   Ext: No edema  MS: Adequate ROM spine, shoulders, hips and knees.  Skin: Intact, no ulcerations or rash noted.  Psych: Good eye contact, normal affect. Memory intact not anxious or depressed appearing.  CNS: CN 2-12 intact, power,  normal throughout.no focal deficits noted.   Assessment &  Plan   Hyperlipemia Hyperlipidemia:Low fat diet discussed and encouraged.   Lipid Panel  Lab Results  Component Value Date   CHOL 191 09/10/2015   HDL 30* 09/10/2015   LDLCALC 122 09/10/2015   TRIG 196* 09/10/2015   CHOLHDL 6.4* 09/10/2015   Updated lab needed at/ before next visit. Medication compliance is stressed     NICOTINE ADDICTION Patient counseled for approximately 5 minutes regarding the health risks of ongoing nicotine use, specifically all types of cancer, heart disease, stroke and respiratory failure. The options available for help with cessation ,the behavioral changes to assist the process, and the option to either gradully reduce usage  Or abruptly stop.is also discussed. Pt is also encouraged to set specific goals in number of cigarettes used daily, as well as to set a quit date.  Number of cigarettes/cigars currently smoking daily:5 TO 7   Uncomplicated type 2 diabetes mellitus ([place]) Improved Andrea Simon is reminded of the importance of commitment to daily physical activity for 30 minutes or more, as able and the need to limit carbohydrate intake to 30 to 60 grams per meal to help with blood sugar control.   The need to take medication as prescribed, test blood sugar as directed, and to call between visits if there is a concern that blood sugar is uncontrolled is also discussed.   Andrea Simon is reminded of the importance of daily foot exam, annual eye examination, and good blood sugar, blood pressure and cholesterol control.  Diabetic Labs Latest Ref Rng 01/09/2016 09/10/2015 05/03/2015 07/02/2014 12/05/2013  HbA1c <  5.7 % 6.1(H) 6.6(H) 6.5(H) 6.2(H) 5.9(H)  Chol 125 - 200 mg/dL - 191 220(H) 188 187  HDL >=46 mg/dL - 30(L) 35(L) 31(L) 39(L)  Calc LDL <130 mg/dL - 122 154(H) 100(H) 122(H)  Triglycerides <150 mg/dL - 196(H) 155(H) 285(H) 131  Creatinine 0.50 - 1.05 mg/dL 0.71 0.81 0.81 0.85 -   BP/Weight 01/13/2016 09/10/2015 05/06/2015 07/04/2014 12/05/2013  [id number]  Systolic BP 123456 [id number] 123456 [id number]  Diastolic BP 74 78 76 78 64 85 72  Wt. (Lbs) 144 153.12 155 155 134.04 - 143.04  BMI 25.51 27.13 27.46 27.46 23.75 - 25.34   Foot/eye exam completion dates Latest Ref Rng 12/03/2015  Eye Exam No Retinopathy No Retinopathy  Foot Form Completion - -         Stress due to illness of family member Coping  Better with her Mother's illness as she gets accustomed to this, also has support from other family members  Overweight (BMI 25.0-29.9) Improved. Patient re-educated about  the importance of commitment to a  minimum of 150 minutes of exercise per week.  The importance of healthy food choices with portion control discussed. Encouraged to start a food diary, count calories and to consider  joining a support group. Sample diet sheets offered. Goals set by the patient for the next several months.   Weight /BMI 01/13/2016 09/10/2015 05/06/2015  WEIGHT 144 lb 153 lb 1.9 oz 155 lb  HEIGHT 5\' 3"  5\' 3"  5\' 3"   BMI 25.51 kg/m2 27.13 kg/m2 27.46 kg/m2    Current exercise per week 90 minutes.        Review of Systems     Objective:   Physical Exam        Assessment & Plan:

## 2016-01-20 DIAGNOSIS — R7303 Prediabetes: Secondary | ICD-10-CM | POA: Insufficient documentation

## 2016-01-20 DIAGNOSIS — E1149 Type 2 diabetes mellitus with other diabetic neurological complication: Secondary | ICD-10-CM | POA: Insufficient documentation

## 2016-01-20 DIAGNOSIS — E119 Type 2 diabetes mellitus without complications: Secondary | ICD-10-CM | POA: Insufficient documentation

## 2016-01-20 NOTE — Assessment & Plan Note (Signed)
Coping  Better with her Mother's illness as she gets accustomed to this, also has support from other family members

## 2016-01-20 NOTE — Assessment & Plan Note (Signed)

## 2016-01-20 NOTE — Assessment & Plan Note (Signed)
Improved Andrea Simon is reminded of the importance of commitment to daily physical activity for 30 minutes or more, as able and the need to limit carbohydrate intake to 30 to 60 grams per meal to help with blood sugar control.   The need to take medication as prescribed, test blood sugar as directed, and to call between visits if there is a concern that blood sugar is uncontrolled is also discussed.   Andrea Simon is reminded of the importance of daily foot exam, annual eye examination, and good blood sugar, blood pressure and cholesterol control.  Diabetic Labs Latest Ref Rng 01/09/2016 09/10/2015 05/03/2015 07/02/2014 12/05/2013  HbA1c <5.7 % 6.1(H) 6.6(H) 6.5(H) 6.2(H) 5.9(H)  Chol 125 - 200 mg/dL - 191 220(H) 188 187  HDL >=46 mg/dL - 30(L) 35(L) 31(L) 39(L)  Calc LDL <130 mg/dL - 122 154(H) 100(H) 122(H)  Triglycerides <150 mg/dL - 196(H) 155(H) 285(H) 131  Creatinine 0.50 - 1.05 mg/dL 0.71 0.81 0.81 0.85 -   BP/Weight 01/13/2016 09/10/2015 05/06/2015 07/04/2014 12/05/2013 [id number]  Systolic BP 123456 [id number] 123456 [id number]  Diastolic BP 74 78 76 78 64 85 72  Wt. (Lbs) 144 153.12 155 155 134.04 - 143.04  BMI 25.51 27.13 27.46 27.46 23.75 - 25.34   Foot/eye exam completion dates Latest Ref Rng 12/03/2015  Eye Exam No Retinopathy No Retinopathy  Foot Form Completion - -

## 2016-01-20 NOTE — Assessment & Plan Note (Signed)
Improved. Patient re-educated about  the importance of commitment to a  minimum of 150 minutes of exercise per week.  The importance of healthy food choices with portion control discussed. Encouraged to start a food diary, count calories and to consider  joining a support group. Sample diet sheets offered. Goals set by the patient for the next several months.   Weight /BMI 01/13/2016 09/10/2015 05/06/2015  WEIGHT 144 lb 153 lb 1.9 oz 155 lb  HEIGHT 5\' 3"  5\' 3"  5\' 3"   BMI 25.51 kg/m2 27.13 kg/m2 27.46 kg/m2    Current exercise per week 90 minutes.

## 2016-01-20 NOTE — Assessment & Plan Note (Signed)
Hyperlipidemia:Low fat diet discussed and encouraged.   Lipid Panel  Lab Results  Component Value Date   CHOL 191 09/10/2015   HDL 30* 09/10/2015   LDLCALC 122 09/10/2015   TRIG 196* 09/10/2015   CHOLHDL 6.4* 09/10/2015   Updated lab needed at/ before next visit. Medication compliance is stressed

## 2016-03-26 ENCOUNTER — Other Ambulatory Visit: Payer: Self-pay | Admitting: Family Medicine

## 2016-03-26 DIAGNOSIS — Z1231 Encounter for screening mammogram for malignant neoplasm of breast: Secondary | ICD-10-CM

## 2016-03-30 ENCOUNTER — Ambulatory Visit (HOSPITAL_COMMUNITY)
Admission: RE | Admit: 2016-03-30 | Discharge: 2016-03-30 | Disposition: A | Discharge status: Home / Self Care | Payer: 59 | Source: Ambulatory Visit | Attending: Family Medicine | Admitting: Family Medicine

## 2016-03-30 DIAGNOSIS — Z1231 Encounter for screening mammogram for malignant neoplasm of breast: Secondary | ICD-10-CM | POA: Insufficient documentation

## 2016-05-02 ENCOUNTER — Other Ambulatory Visit: Payer: Self-pay | Admitting: Family Medicine

## 2016-06-12 ENCOUNTER — Other Ambulatory Visit: Payer: Self-pay | Admitting: Family Medicine

## 2016-06-15 ENCOUNTER — Encounter: Payer: Self-pay | Admitting: Family Medicine

## 2016-06-15 ENCOUNTER — Ambulatory Visit: Payer: 59 | Admitting: Family Medicine

## 2016-08-07 ENCOUNTER — Telehealth: Payer: Self-pay

## 2016-08-07 DIAGNOSIS — E785 Hyperlipidemia, unspecified: Secondary | ICD-10-CM

## 2016-08-07 DIAGNOSIS — R7303 Prediabetes: Secondary | ICD-10-CM

## 2016-08-07 NOTE — Telephone Encounter (Signed)
Lab ordered re-entered due to being expired.

## 2016-08-15 LAB — COMPLETE METABOLIC PANEL WITH GFR
ALBUMIN: 4.6 g/dL (ref 3.6–5.1)
ALK PHOS: 66 U/L (ref 33–130)
ALT: 18 U/L (ref 6–29)
AST: 20 U/L (ref 10–35)
BILIRUBIN TOTAL: 0.7 mg/dL (ref 0.2–1.2)
BUN: 13 mg/dL (ref 7–25)
CO2: 25 mmol/L (ref 20–31)
CREATININE: 0.86 mg/dL (ref 0.50–1.05)
Calcium: 9.8 mg/dL (ref 8.6–10.4)
Chloride: 106 mmol/L (ref 98–110)
GFR, EST NON AFRICAN AMERICAN: 75 mL/min (ref >=60)
GFR, Est African American: 86 mL/min (ref >=60)
GLUCOSE: 107 mg/dL — AB (ref 65–99)
Potassium: 4 mmol/L (ref 3.5–5.3)
SODIUM: 141 mmol/L (ref 135–146)
TOTAL PROTEIN: 7.3 g/dL (ref 6.1–8.1)

## 2016-08-15 LAB — LIPID PANEL
Cholesterol: 196 mg/dL (ref 125–200)
HDL: 37 mg/dL — AB (ref >=46)
LDL Cholesterol: 121 mg/dL (ref <130)
TRIGLYCERIDES: 189 mg/dL — AB (ref <150)
Total CHOL/HDL Ratio: 5.3 Ratio — ABNORMAL HIGH (ref <=5.0)
VLDL: 38 mg/dL — ABNORMAL HIGH (ref <30)

## 2016-08-16 LAB — HEMOGLOBIN A1C
Hgb A1c MFr Bld: 6 % — ABNORMAL HIGH (ref <5.7)
Mean Plasma Glucose: 126 mg/dL

## 2016-08-18 ENCOUNTER — Ambulatory Visit (INDEPENDENT_AMBULATORY_CARE_PROVIDER_SITE_OTHER): Payer: 59 | Admitting: Family Medicine

## 2016-08-18 ENCOUNTER — Encounter: Payer: Self-pay | Admitting: Family Medicine

## 2016-08-18 VITALS — BP 122/82 | HR 82 | Resp 16 | Ht 63.0 in | Wt 148.0 lb

## 2016-08-18 DIAGNOSIS — E041 Nontoxic single thyroid nodule: Secondary | ICD-10-CM | POA: Diagnosis not present

## 2016-08-18 DIAGNOSIS — E785 Hyperlipidemia, unspecified: Secondary | ICD-10-CM | POA: Diagnosis not present

## 2016-08-18 DIAGNOSIS — E119 Type 2 diabetes mellitus without complications: Secondary | ICD-10-CM

## 2016-08-18 DIAGNOSIS — Z23 Encounter for immunization: Secondary | ICD-10-CM

## 2016-08-18 DIAGNOSIS — F17200 Nicotine dependence, unspecified, uncomplicated: Secondary | ICD-10-CM

## 2016-08-18 DIAGNOSIS — E663 Overweight: Secondary | ICD-10-CM

## 2016-08-18 MED ORDER — BUPROPION HCL ER (SR) 150 MG PO TB12
150.0000 mg | ORAL_TABLET | Freq: Two times a day (BID) | ORAL | 1 refills | Status: DC
Start: 2016-08-18 — End: 2017-02-04

## 2016-08-18 NOTE — Patient Instructions (Addendum)
Annual physical exam  In December , call if you need me before  Work on smoking cessation   Flu vaccine today  Script for Wellbutrin plan to QUIT 2 weeks after  Pls reduce fried and fatty foods   It is important that you exercise regularly at least 30 minutes 5 times a week. If you develop chest pain, have severe difficulty breathing, or feel very tired, stop exercising immediately and seek medical attention   Foot exam today is good    You Can Quit Smoking If you are ready to quit smoking or are thinking about it, congratulations! You have chosen to help yourself be healthier and live longer! There are lots of different ways to quit smoking. Nicotine gum, nicotine patches, a nicotine inhaler, or nicotine nasal spray can help with physical craving. Hypnosis, support groups, and medicines help break the habit of smoking. TIPS TO GET OFF AND STAY OFF CIGARETTES  Learn to predict your moods. Do not let a bad situation be your excuse to have a cigarette. Some situations in your life might tempt you to have a cigarette.  Ask friends and co-workers not to smoke around you.  Make your home smoke-free.  Never have "just one" cigarette. It leads to wanting another and another. Remind yourself of your decision to quit.  On a card, make a list of your reasons for not smoking. Read it at least the same number of times a day as you have a cigarette. Tell yourself everyday, "I do not want to smoke. I choose not to smoke."  Ask someone at home or work to help you with your plan to quit smoking.  Have something planned after you eat or have a cup of coffee. Take a walk or get other exercise to perk you up. This will help to keep you from overeating.  Try a relaxation exercise to calm you down and decrease your stress. Remember, you may be tense and nervous the first two weeks after you quit. This will pass.  Find new activities to keep your hands busy. Play with a pen, coin, or rubber band.  Doodle or draw things on paper.  Brush your teeth right after eating. This will help cut down the craving for the taste of tobacco after meals. You can try mouthwash too.  Try gum, breath mints, or diet candy to keep something in your mouth. IF YOU SMOKE AND WANT TO QUIT:  Do not stock up on cigarettes. Never buy a carton. Wait until one pack is finished before you buy another.  Never carry cigarettes with you at work or at home.  Keep cigarettes as far away from you as possible. Leave them with someone else.  Never carry matches or a lighter with you.  Ask yourself, "Do I need this cigarette or is this just a reflex?"  Bet with someone that you can quit. Put cigarette money in a piggy bank every morning. If you smoke, you give up the money. If you do not smoke, by the end of the week, you keep the money.  Keep trying. It takes 21 days to change a habit!  Talk to your doctor about using medicines to help you quit. These include nicotine replacement gum, lozenges, or skin patches.   This information is not intended to replace advice given to you by your health care provider. Make sure you discuss any questions you have with your health care provider.   Document Released: 10/03/2009 Document Revised: 02/29/2012 Document Reviewed:  10/03/2009 Elsevier Interactive Patient Education 2016 Elsevier Inc.   Fasting labs 1 week before f/u

## 2016-08-19 ENCOUNTER — Other Ambulatory Visit (HOSPITAL_COMMUNITY)
Admission: RE | Admit: 2016-08-19 | Discharge: 2016-08-19 | Disposition: A | Discharge status: Home / Self Care | Payer: 59 | Source: Other Acute Inpatient Hospital | Attending: Family Medicine | Admitting: Family Medicine

## 2016-08-19 DIAGNOSIS — E119 Type 2 diabetes mellitus without complications: Secondary | ICD-10-CM | POA: Diagnosis not present

## 2016-08-20 LAB — MICROALBUMIN / CREATININE URINE RATIO
Creatinine, Urine: 125.6 mg/dL
MICROALB UR: 19.5 ug/mL — AB
MICROALB/CREAT RATIO: 15.5 mg/g{creat} (ref 0.0–30.0)

## 2016-08-21 ENCOUNTER — Ambulatory Visit (HOSPITAL_COMMUNITY): Payer: 59

## 2016-08-24 ENCOUNTER — Encounter: Payer: Self-pay | Admitting: Family Medicine

## 2016-08-24 NOTE — Assessment & Plan Note (Signed)
Andrea Simon is reminded of the importance of commitment to daily physical activity for 30 minutes or more, as able and the need to limit carbohydrate intake to 30 to 60 grams per meal to help with blood sugar control.   The need to take medication as prescribed, test blood sugar as directed, and to call between visits if there is a concern that blood sugar is uncontrolled is also discussed.   Andrea Simon is reminded of the importance of daily foot exam, annual eye examination, and good blood sugar, blood pressure and cholesterol control. Controlled, no change in medication   Diabetic Labs Latest Ref Rng & Units 08/19/2016 08/15/2016 01/09/2016 09/10/2015 05/03/2015  HbA1c <5.7 % - 6.0(H) 6.1(H) 6.6(H) 6.5(H)  Microalbumin Not Estab. ug/mL 19.5(H) - - - -  Micro/Creat Ratio 0.0 - 30.0 mg/g creat 15.5 - - - -  Chol 125 - 200 mg/dL - 196 - 191 220(H)  HDL >=46 mg/dL - 37(L) - 30(L) 35(L)  Calc LDL <130 mg/dL - 121 - 122 154(H)  Triglycerides <150 mg/dL - 189(H) - 196(H) 155(H)  Creatinine 0.50 - 1.05 mg/dL - 0.86 0.71 0.81 0.81   BP/Weight 08/18/2016 01/13/2016 09/10/2015 05/06/2015 07/04/2014 [id number] [id number]  Systolic BP 123XX123 123456 [id number] 123456 [id number]  Diastolic BP 82 74 78 76 78 64 85  Wt. (Lbs) 148 144 153.12 155 155 134.04 -  BMI 26.22 25.51 27.13 27.46 27.46 23.75 -   Foot/eye exam completion dates Latest Ref Rng & Units 12/03/2015  Eye Exam No Retinopathy No Retinopathy  Foot Form Completion - -

## 2016-08-24 NOTE — Assessment & Plan Note (Signed)
Deteriorated. Patient re-educated about  the importance of commitment to a  minimum of 150 minutes of exercise per week.  The importance of healthy food choices with portion control discussed. Encouraged to start a food diary, count calories and to consider  joining a support group. Sample diet sheets offered. Goals set by the patient for the next several months.   Weight /BMI 08/18/2016 01/13/2016 09/10/2015  WEIGHT 148 lb 144 lb 153 lb 1.9 oz  HEIGHT 5\' 3"  5\' 3"  5\' 3"   BMI 26.22 kg/m2 25.51 kg/m2 27.13 kg/m2

## 2016-08-24 NOTE — Assessment & Plan Note (Signed)
UnControlled, no change in medication, behavior modification discussed to improve HDL and TG Hyperlipidemia:Low fat diet discussed and encouraged.   Lipid Panel  Lab Results  Component Value Date   CHOL 196 08/15/2016   HDL 37 (L) 08/15/2016   LDLCALC 121 08/15/2016   TRIG 189 (H) 08/15/2016   CHOLHDL 5.3 (H) 08/15/2016

## 2016-08-24 NOTE — Assessment & Plan Note (Signed)
Patient counseled for approximately 5 minutes regarding the health risks of ongoing nicotine use, specifically all types of cancer, heart disease, stroke and respiratory failure. The options available for help with cessation ,the behavioral changes to assist the process, and the option to either gradully reduce usage  Or abruptly stop.is also discussed. Pt is also encouraged to set specific goals in number of cigarettes used daily, as well as to set a quit date. Start Wellbutrin when ready to quit in 2 weeks from start date

## 2016-08-24 NOTE — Assessment & Plan Note (Signed)
After obtaining informed consent, the vaccine is  administered by LPN.  

## 2016-08-24 NOTE — Progress Notes (Signed)
Andrea Simon     MRN: [id number]      DOB: [birth year]   HPI Andrea Simon is here for follow up and re-evaluation of chronic medical conditions, medication management and review of any available recent lab and radiology data.  Preventive health is updated, specifically  Cancer screening and Immunization.   Questions or concerns regarding consultations or procedures which the PT has had in the interim are  addressed. The PT denies any adverse reactions to current medications since the last visit.  There are no new concerns.  There are no specific complaints   ROS Denies recent fever or chills. Denies sinus pressure, nasal congestion, ear pain or sore throat. Denies chest congestion, productive cough or wheezing. Denies chest pains, palpitations and leg swelling Denies abdominal pain, nausea, vomiting,diarrhea or constipation.   Denies dysuria, frequency, hesitancy or incontinence. Denies joint pain, swelling and limitation in mobility. Denies headaches, seizures, numbness, or tingling. Denies depression, anxiety or insomnia. Denies skin break down or rash.   PE  BP 122/82   Pulse 82   Resp 16   Ht 5\' 3"  (1.6 m)   Wt 148 lb (67.1 kg)   SpO2 98%   BMI 26.22 kg/m   Patient alert and oriented and in no cardiopulmonary distress.  HEENT: No facial asymmetry, EOMI,   oropharynx pink and moist.  Neck supple no JVD, no mass.  Chest: Clear to auscultation bilaterally.  CVS: S1, S2 no murmurs, no S3.Regular rate.  ABD: Soft non tender.   Ext: No edema  MS: Adequate ROM spine, shoulders, hips and knees.  Skin: Intact, no ulcerations or rash noted.  Psych: Good eye contact, normal affect. Memory intact not anxious or depressed appearing.  CNS: CN 2-12 intact, power,  normal throughout.no focal deficits noted.   Assessment & Plan  Uncomplicated type 2 diabetes mellitus ([place]) Andrea Simon is reminded of the importance of commitment to daily physical activity for 30  minutes or more, as able and the need to limit carbohydrate intake to 30 to 60 grams per meal to help with blood sugar control.   The need to take medication as prescribed, test blood sugar as directed, and to call between visits if there is a concern that blood sugar is uncontrolled is also discussed.   Andrea Simon is reminded of the importance of daily foot exam, annual eye examination, and good blood sugar, blood pressure and cholesterol control. Controlled, no change in medication   Diabetic Labs Latest Ref Rng & Units 08/19/2016 08/15/2016 01/09/2016 09/10/2015 05/03/2015  HbA1c <5.7 % - 6.0(H) 6.1(H) 6.6(H) 6.5(H)  Microalbumin Not Estab. ug/mL 19.5(H) - - - -  Micro/Creat Ratio 0.0 - 30.0 mg/g creat 15.5 - - - -  Chol 125 - 200 mg/dL - 196 - 191 220(H)  HDL >=46 mg/dL - 37(L) - 30(L) 35(L)  Calc LDL <130 mg/dL - 121 - 122 154(H)  Triglycerides <150 mg/dL - 189(H) - 196(H) 155(H)  Creatinine 0.50 - 1.05 mg/dL - 0.86 0.71 0.81 0.81   BP/Weight 08/18/2016 01/13/2016 09/10/2015 05/06/2015 07/04/2014 [id number]  Systolic BP 123XX123 123456 [id number] 123456 [id number]  Diastolic BP 82 74 78 76 78 64 85  Wt. (Lbs) 148 144 153.12 155 155 134.04 -  BMI 26.22 25.51 27.13 27.46 27.46 23.75 -   Foot/eye exam completion dates Latest Ref Rng & Units 12/03/2015  Eye Exam No Retinopathy No Retinopathy  Foot Form Completion - -  Need for prophylactic vaccination and inoculation against influenza After obtaining informed consent, the vaccine is  administered by LPN.   Overweight (BMI 25.0-29.9) Deteriorated. Patient re-educated about  the importance of commitment to a  minimum of 150 minutes of exercise per week.  The importance of healthy food choices with portion control discussed. Encouraged to start a food diary, count calories and to consider  joining a support group. Sample diet sheets offered. Goals set by the patient for the next several months.   Weight /BMI 08/18/2016 01/13/2016  09/10/2015  WEIGHT 148 lb 144 lb 153 lb 1.9 oz  HEIGHT 5\' 3"  5\' 3"  5\' 3"   BMI 26.22 kg/m2 25.51 kg/m2 27.13 kg/m2      NICOTINE ADDICTION Patient counseled for approximately 5 minutes regarding the health risks of ongoing nicotine use, specifically all types of cancer, heart disease, stroke and respiratory failure. The options available for help with cessation ,the behavioral changes to assist the process, and the option to either gradully reduce usage  Or abruptly stop.is also discussed. Pt is also encouraged to set specific goals in number of cigarettes used daily, as well as to set a quit date. Start Wellbutrin when ready to quit in 2 weeks from start date    Hyperlipemia UnControlled, no change in medication, behavior modification discussed to improve HDL and TG Hyperlipidemia:Low fat diet discussed and encouraged.   Lipid Panel  Lab Results  Component Value Date   CHOL 196 08/15/2016   HDL 37 (L) 08/15/2016   LDLCALC 121 08/15/2016   TRIG 189 (H) 08/15/2016   CHOLHDL 5.3 (H) 08/15/2016

## 2016-08-25 ENCOUNTER — Ambulatory Visit (HOSPITAL_COMMUNITY)
Admission: RE | Admit: 2016-08-25 | Discharge: 2016-08-25 | Disposition: A | Discharge status: Home / Self Care | Payer: 59 | Source: Ambulatory Visit | Attending: Family Medicine | Admitting: Family Medicine

## 2016-08-25 DIAGNOSIS — Z23 Encounter for immunization: Secondary | ICD-10-CM | POA: Diagnosis not present

## 2016-08-25 DIAGNOSIS — E041 Nontoxic single thyroid nodule: Secondary | ICD-10-CM | POA: Diagnosis present

## 2016-08-25 DIAGNOSIS — E042 Nontoxic multinodular goiter: Secondary | ICD-10-CM | POA: Diagnosis not present

## 2016-10-13 ENCOUNTER — Other Ambulatory Visit: Payer: Self-pay | Admitting: Family Medicine

## 2016-12-16 ENCOUNTER — Encounter: Payer: 59 | Admitting: Family Medicine

## 2017-01-04 ENCOUNTER — Other Ambulatory Visit: Payer: Self-pay | Admitting: Family Medicine

## 2017-01-05 LAB — COMPREHENSIVE METABOLIC PANEL
A/G RATIO: 1.7 (ref 1.2–2.2)
ALK PHOS: 70 IU/L (ref 39–117)
ALT: 19 IU/L (ref 0–32)
AST: 19 IU/L (ref 0–40)
Albumin: 4.4 g/dL (ref 3.5–5.5)
BUN/Creatinine Ratio: 8 — ABNORMAL LOW (ref 9–23)
BUN: 7 mg/dL (ref 6–24)
Bilirubin Total: 0.8 mg/dL (ref 0.0–1.2)
CO2: 25 mmol/L (ref 18–29)
Calcium: 9.7 mg/dL (ref 8.7–10.2)
Chloride: 101 mmol/L (ref 96–106)
Creatinine, Ser: 0.84 mg/dL (ref 0.57–1.00)
GFR calc Af Amer: 88 mL/min/{1.73_m2} (ref >59)
GFR, EST NON AFRICAN AMERICAN: 76 mL/min/{1.73_m2} (ref >59)
GLOBULIN, TOTAL: 2.6 g/dL (ref 1.5–4.5)
Glucose: 109 mg/dL — ABNORMAL HIGH (ref 65–99)
POTASSIUM: 3.8 mmol/L (ref 3.5–5.2)
SODIUM: 140 mmol/L (ref 134–144)
Total Protein: 7 g/dL (ref 6.0–8.5)

## 2017-01-05 LAB — TSH: TSH: 2.59 u[IU]/mL (ref 0.450–4.500)

## 2017-01-05 LAB — LIPID PANEL W/O CHOL/HDL RATIO
CHOLESTEROL TOTAL: 224 mg/dL — AB (ref 100–199)
HDL: 33 mg/dL — ABNORMAL LOW (ref >39)
LDL Calculated: 134 mg/dL — ABNORMAL HIGH (ref 0–99)
TRIGLYCERIDES: 283 mg/dL — AB (ref 0–149)
VLDL Cholesterol Cal: 57 mg/dL — ABNORMAL HIGH (ref 5–40)

## 2017-01-05 LAB — HGB A1C W/O EAG: HEMOGLOBIN A1C: 6.2 % — AB (ref 4.8–5.6)

## 2017-01-06 ENCOUNTER — Encounter: Payer: 59 | Admitting: Family Medicine

## 2017-01-15 ENCOUNTER — Telehealth: Payer: Self-pay

## 2017-01-15 MED ORDER — OSELTAMIVIR PHOSPHATE 75 MG PO CAPS: 75.0000 mg | ORAL_CAPSULE | Freq: Two times a day (BID) | ORAL | 0 refills | Status: AC

## 2017-01-15 NOTE — Telephone Encounter (Signed)
Acute onset of fever Yes.   Headache Yes.   Body ache Yes.   Fatigue Yes.   Non productive cough Yes.   Sore throat Yes.   Nasal discharge Yes.    Onset between 1-4 days of possible exposure Yes.    Recommendation:  Fluid, rest, Tylenol for control of fever  Expect 5 days before feeling better and not so contagious and generally better in 7-10 days.  Standard treatment Tama flu 75 mg 1 tablet twice daily times 5 days  Please call office if symptoms do not improve or worsen in 5 days after beginning treatment.      

## 2017-02-04 ENCOUNTER — Ambulatory Visit (INDEPENDENT_AMBULATORY_CARE_PROVIDER_SITE_OTHER): Payer: 59 | Admitting: Family Medicine

## 2017-02-04 ENCOUNTER — Encounter: Payer: Self-pay | Admitting: Family Medicine

## 2017-02-04 VITALS — BP 122/76 | HR 82 | Resp 18 | Ht 63.0 in | Wt 149.1 lb

## 2017-02-04 DIAGNOSIS — F17210 Nicotine dependence, cigarettes, uncomplicated: Secondary | ICD-10-CM

## 2017-02-04 DIAGNOSIS — E782 Mixed hyperlipidemia: Secondary | ICD-10-CM

## 2017-02-04 DIAGNOSIS — F17200 Nicotine dependence, unspecified, uncomplicated: Secondary | ICD-10-CM

## 2017-02-04 DIAGNOSIS — Z0000 Encounter for general adult medical examination without abnormal findings: Secondary | ICD-10-CM

## 2017-02-04 DIAGNOSIS — E1169 Type 2 diabetes mellitus with other specified complication: Secondary | ICD-10-CM

## 2017-02-04 DIAGNOSIS — R7303 Prediabetes: Secondary | ICD-10-CM

## 2017-02-04 DIAGNOSIS — Z23 Encounter for immunization: Secondary | ICD-10-CM

## 2017-02-04 DIAGNOSIS — Z1211 Encounter for screening for malignant neoplasm of colon: Secondary | ICD-10-CM

## 2017-02-04 LAB — HEMOCCULT GUIAC POC 1CARD (OFFICE): Fecal Occult Blood, POC: NEGATIVE

## 2017-02-04 NOTE — Patient Instructions (Addendum)
F/u in 5.5 month, call if you need me sooner  Prevnar vaccine today Please change to plant based eatring to improve your health, especially cholesterol  It is important that you exercise regularly at least 30 minutes 5 times a week. If you develop chest pain, have severe difficulty breathing, or feel very tired, stop exercising immediately and seek medical attention   Fasting lipid, cmp and eGFr, hBA1C and cBC in 5.5 month  Please limit cigarettes to 10 per day and work on QUITTING    Steps to Quit Smoking Smoking tobacco can be bad for your health. It can also affect almost every organ in your body. Smoking puts you and people around you at risk for many serious long-lasting (chronic) diseases. Quitting smoking is hard, but it is one of the best things that you can do for your health. It is never too late to quit. What are the benefits of quitting smoking? When you quit smoking, you lower your risk for getting serious diseases and conditions. They can include:  Lung cancer or lung disease.  Heart disease.  Stroke.  Heart attack.  Not being able to have children (infertility).  Weak bones (osteoporosis) and broken bones (fractures). If you have coughing, wheezing, and shortness of breath, those symptoms may get better when you quit. You may also get sick less often. If you are pregnant, quitting smoking can help to lower your chances of having a baby of low birth weight. What can I do to help me quit smoking? Talk with your doctor about what can help you quit smoking. Some things you can do (strategies) include:  Quitting smoking totally, instead of slowly cutting back how much you smoke over a period of time.  Going to in-person counseling. You are more likely to quit if you go to many counseling sessions.  Using resources and support systems, such as:  Online chats with a Social worker.  Phone quitlines.  Printed Furniture conservator/restorer.  Support groups or group  counseling.  Text messaging programs.  Mobile phone apps or applications.  Taking medicines. Some of these medicines may have nicotine in them. If you are pregnant or breastfeeding, do not take any medicines to quit smoking unless your doctor says it is okay. Talk with your doctor about counseling or other things that can help you. Talk with your doctor about using more than one strategy at the same time, such as taking medicines while you are also going to in-person counseling. This can help make quitting easier. What things can I do to make it easier to quit? Quitting smoking might feel very hard at first, but there is a lot that you can do to make it easier. Take these steps:  Talk to your family and friends. Ask them to support and encourage you.  Call phone quitlines, reach out to support groups, or work with a Social worker.  Ask people who smoke to not smoke around you.  Avoid places that make you want (trigger) to smoke, such as:  Bars.  Parties.  Smoke-break areas at work.  Spend time with people who do not smoke.  Lower the stress in your life. Stress can make you want to smoke. Try these things to help your stress:  Getting regular exercise.  Deep-breathing exercises.  Yoga.  Meditating.  Doing a body scan. To do this, close your eyes, focus on one area of your body at a time from head to toe, and notice which parts of your body are tense. Try  to relax the muscles in those areas.  Download or buy apps on your mobile phone or tablet that can help you stick to your quit plan. There are many free apps, such as QuitGuide from the [employer] Office manager for Disease Control and Prevention). You can find more support from smokefree.gov and other websites. This information is not intended to replace advice given to you by your health care provider. Make sure you discuss any questions you have with your health care provider. Document Released: 10/03/2009 Document Revised: 08/04/2016  Document Reviewed: 04/23/2015 Elsevier Interactive Patient Education  2017 [employer].

## 2017-02-05 ENCOUNTER — Encounter: Payer: Self-pay | Admitting: Family Medicine

## 2017-02-05 NOTE — Assessment & Plan Note (Signed)

## 2017-02-05 NOTE — Assessment & Plan Note (Signed)
Deteriorated Andrea Simon is reminded of the importance of commitment to daily physical activity for 30 minutes or more, as able and the need to limit carbohydrate intake to 30 to 60 grams per meal to help with blood sugar control.   The need to take medication as prescribed, test blood sugar as directed, and to call between visits if there is a concern that blood sugar is uncontrolled is also discussed.   Andrea Simon is reminded of the importance of daily foot exam, annual eye examination, and good blood sugar, blood pressure and cholesterol control.  Diabetic Labs Latest Ref Rng & Units 01/04/2017 08/19/2016 08/15/2016 01/09/2016 09/10/2015  HbA1c 4.8 - 5.6 % 6.2(H) - 6.0(H) 6.1(H) 6.6(H)  Microalbumin Not Estab. ug/mL - 19.5(H) - - -  Micro/Creat Ratio 0.0 - 30.0 mg/g creat - 15.5 - - -  Chol 100 - 199 mg/dL 224(H) - 196 - 191  HDL >39 mg/dL 33(L) - 37(L) - 30(L)  Calc LDL 0 - 99 mg/dL 134(H) - 121 - 122  Triglycerides 0 - 149 mg/dL 283(H) - 189(H) - 196(H)  Creatinine 0.57 - 1.00 mg/dL 0.84 - 0.86 0.71 0.81   BP/Weight 02/04/2017 08/18/2016 01/13/2016 09/10/2015 05/06/2015 07/04/2014 123XX123  Systolic BP 123XX123 123XX123 123456 Q000111Q 123456 [id number]  Diastolic BP 76 82 74 78 76 78 64  Wt. (Lbs) 149.12 148 144 153.12 155 155 134.04  BMI 26.42 26.22 25.51 27.13 27.46 27.46 23.75   Foot/eye exam completion dates Latest Ref Rng & Units 08/18/2016 12/03/2015  Eye Exam No Retinopathy - No Retinopathy  Foot Form Completion - Done -

## 2017-02-05 NOTE — Assessment & Plan Note (Signed)
Annual exam as documented. Counseling done  re healthy lifestyle involving commitment to 150 minutes exercise per week, heart healthy diet, and attaining healthy weight.The importance of adequate sleep also discussed.  Immunization and cancer screening needs are specifically addressed at this visit.  

## 2017-02-05 NOTE — Progress Notes (Signed)
Andrea Simon     MRN: [id number]      DOB: Jan 09, 1956  HPI: Patient is in for annual physical exam. No other health concerns are expressed or addressed at the visit. Recent labs,  are reviewed. Immunization is reviewed , and  updated if needed.   PE: BP 122/76   Pulse 82   Resp 18   Ht 5\' 3"  (1.6 m)   Wt 149 lb 1.9 oz (67.6 kg)   SpO2 96%   BMI 26.42 kg/m    Pleasant  female, alert and oriented x 3, in no cardio-pulmonary distress. Afebrile. HEENT No facial trauma or asymetry. Sinuses non tender.  Extra occullar muscles intact, pupils equally reactive to light. External ears normal, tympanic membranes clear. Oropharynx moist, no exudate. Neck: supple, no adenopathy,JVD or thyromegaly.No bruits.  Chest: Clear to ascultation bilaterally.No crackles or wheezes.Decreased though adequate air entry Non tender to palpation  Breast: No asymetry,no masses or lumps. No tenderness. No nipple discharge or inversion. No axillary or supraclavicular adenopathy  Cardiovascular system; Heart sounds normal,  S1 and  S2 ,no S3.  No murmur, or thrill. Apical beat not displaced Peripheral pulses normal.  Abdomen: Soft, non tender, no organomegaly or masses. No bruits. Bowel sounds normal. No guarding, tenderness or rebound.  Rectal:  Normal sphincter tone. No rectal mass. Guaiac negative stool.  GU: Not examined , asymptomatic and paps are up to date  Musculoskeletal exam: Full ROM of spine, hips , shoulders and knees. No deformity ,swelling or crepitus noted. No muscle wasting or atrophy.   Neurologic: Cranial nerves 2 to 12 intact. Power, tone ,sensation and reflexes normal throughout. No disturbance in gait. No tremor.  Skin: Intact, no ulceration, erythema , scaling or rash noted. Pigmentation normal throughout  Psych; Normal mood and affect. Judgement and concentration normal   Assessment & Plan:  Annual physical exam Annual exam as  documented. Counseling done  re healthy lifestyle involving commitment to 150 minutes exercise per week, heart healthy diet, and attaining healthy weight.The importance of adequate sleep also discussed. Immunization and cancer screening needs are specifically addressed at this visit.   Need for vaccination with 13-polyvalent pneumococcal conjugate vaccine After obtaining informed consent, the vaccine is  administered by LPN.   NICOTINE ADDICTION Patient counseled for approximately 5 minutes regarding the health risks of ongoing nicotine use, specifically all types of cancer, heart disease, stroke and respiratory failure. The options available for help with cessation ,the behavioral changes to assist the process, and the option to either gradully reduce usage  Or abruptly stop.is also discussed. Pt is also encouraged to set specific goals in number of cigarettes used daily, as well as to set a quit date.     Type 2 diabetes mellitus with other specified complication [facility]) Deteriorated [name] is reminded of the importance of commitment to daily physical activity for 30 minutes or more, as able and the need to limit carbohydrate intake to 30 to 60 grams per meal to help with blood sugar control.   The need to take medication as prescribed, test blood sugar as directed, and to call between visits if there is a concern that blood sugar is uncontrolled is also discussed.   [name] is reminded of the importance of daily foot exam, annual eye examination, and good blood sugar, blood pressure and cholesterol control.  Diabetic Labs Latest Ref Rng & Units 01/04/2017 08/19/2016 08/15/2016 01/09/2016 09/10/2015  HbA1c 4.8 - 5.6 % 6.2(H) - 6.0(H)  6.1(H) 6.6(H)  Microalbumin Not Estab. ug/mL - 19.5(H) - - -  Micro/Creat Ratio 0.0 - 30.0 mg/g creat - 15.5 - - -  Chol 100 - 199 mg/dL 224(H) - 196 - 191  HDL >39 mg/dL 33(L) - 37(L) - 30(L)  Calc LDL 0 - 99 mg/dL 134(H) - 121 - 122  Triglycerides 0  - 149 mg/dL 283(H) - 189(H) - 196(H)  Creatinine 0.57 - 1.00 mg/dL 0.84 - 0.86 0.71 0.81   BP/Weight 02/04/2017 08/18/2016 01/13/2016 09/10/2015 05/06/2015 07/04/2014 123XX123  Systolic BP 123XX123 123XX123 123456 [id number] 123456 [id number]  Diastolic BP 76 82 74 78 76 78 64  Wt. (Lbs) 149.12 148 144 153.12 155 155 134.04  BMI 26.42 26.22 25.51 27.13 27.46 27.46 23.75   Foot/eye exam completion dates Latest Ref Rng & Units 08/18/2016 12/03/2015  Eye Exam No Retinopathy - No Retinopathy  Foot Form Completion - Done -        Hyperlipemia Deteriorated Hyperlipidemia:Low fat diet discussed and encouraged.   Lipid Panel  Lab Results  Component Value Date   CHOL 224 (H) 01/04/2017   HDL 33 (L) 01/04/2017   LDLCALC 134 (H) 01/04/2017   TRIG 283 (H) 01/04/2017   CHOLHDL 5.3 (H) 08/15/2016   Updated lab needed at/ before next visit. No med change

## 2017-02-05 NOTE — Assessment & Plan Note (Signed)
Deteriorated Hyperlipidemia:Low fat diet discussed and encouraged.   Lipid Panel  Lab Results  Component Value Date   CHOL 224 (H) 01/04/2017   HDL 33 (L) 01/04/2017   LDLCALC 134 (H) 01/04/2017   TRIG 283 (H) 01/04/2017   CHOLHDL 5.3 (H) 08/15/2016   Updated lab needed at/ before next visit. No med change

## 2017-02-05 NOTE — Assessment & Plan Note (Signed)
After obtaining informed consent, the vaccine is  administered by LPN.  

## 2017-05-06 ENCOUNTER — Other Ambulatory Visit: Payer: Self-pay | Admitting: Family Medicine

## 2017-07-21 ENCOUNTER — Ambulatory Visit: Payer: 59 | Admitting: Family Medicine

## 2017-08-31 ENCOUNTER — Other Ambulatory Visit: Payer: Self-pay | Admitting: Family Medicine

## 2017-09-01 LAB — CBC WITH DIFFERENTIAL/PLATELET
Basophils Absolute: 0 10*3/uL (ref 0.0–0.2)
Basos: 1 %
EOS (ABSOLUTE): 0.1 10*3/uL (ref 0.0–0.4)
Eos: 1 %
Hematocrit: 41 % (ref 34.0–46.6)
Hemoglobin: 14.1 g/dL (ref 11.1–15.9)
IMMATURE GRANULOCYTES: 0 %
Immature Grans (Abs): 0 10*3/uL (ref 0.0–0.1)
Lymphocytes Absolute: 3.5 10*3/uL — ABNORMAL HIGH (ref 0.7–3.1)
Lymphs: 49 %
MCH: 34.6 pg — ABNORMAL HIGH (ref 26.6–33.0)
MCHC: 34.4 g/dL (ref 31.5–35.7)
MCV: 101 fL — ABNORMAL HIGH (ref 79–97)
MONOS ABS: 0.4 10*3/uL (ref 0.1–0.9)
Monocytes: 6 %
NEUTROS PCT: 43 %
Neutrophils Absolute: 3 10*3/uL (ref 1.4–7.0)
PLATELETS: 225 10*3/uL (ref 150–379)
RBC: 4.08 x10E6/uL (ref 3.77–5.28)
RDW: 15.1 % (ref 12.3–15.4)
WBC: 6.9 10*3/uL (ref 3.4–10.8)

## 2017-09-01 LAB — COMPREHENSIVE METABOLIC PANEL
ALBUMIN: 4.9 g/dL (ref 3.5–5.5)
ALT: 23 IU/L (ref 0–32)
AST: 22 IU/L (ref 0–40)
Albumin/Globulin Ratio: 2.1 (ref 1.2–2.2)
Alkaline Phosphatase: 78 IU/L (ref 39–117)
BILIRUBIN TOTAL: 0.6 mg/dL (ref 0.0–1.2)
BUN / CREAT RATIO: 12 (ref 9–23)
BUN: 9 mg/dL (ref 6–24)
CALCIUM: 10.2 mg/dL (ref 8.7–10.2)
CHLORIDE: 102 mmol/L (ref 96–106)
CO2: 24 mmol/L (ref 20–29)
CREATININE: 0.76 mg/dL (ref 0.57–1.00)
GFR calc non Af Amer: 86 mL/min/{1.73_m2} (ref >59)
GFR, EST AFRICAN AMERICAN: 99 mL/min/{1.73_m2} (ref >59)
GLUCOSE: 119 mg/dL — AB (ref 65–99)
Globulin, Total: 2.3 g/dL (ref 1.5–4.5)
Potassium: 4.2 mmol/L (ref 3.5–5.2)
Sodium: 144 mmol/L (ref 134–144)
TOTAL PROTEIN: 7.2 g/dL (ref 6.0–8.5)

## 2017-09-01 LAB — LIPID PANEL W/O CHOL/HDL RATIO
Cholesterol, Total: 203 mg/dL — ABNORMAL HIGH (ref 100–199)
HDL: 35 mg/dL — AB (ref >39)
LDL CALC: 125 mg/dL — AB (ref 0–99)
Triglycerides: 213 mg/dL — ABNORMAL HIGH (ref 0–149)
VLDL CHOLESTEROL CAL: 43 mg/dL — AB (ref 5–40)

## 2017-09-01 LAB — HGB A1C W/O EAG: Hgb A1c MFr Bld: 6.3 % — ABNORMAL HIGH (ref 4.8–5.6)

## 2017-09-02 ENCOUNTER — Encounter: Payer: Self-pay | Admitting: Family Medicine

## 2017-09-02 ENCOUNTER — Ambulatory Visit (INDEPENDENT_AMBULATORY_CARE_PROVIDER_SITE_OTHER): Payer: 59 | Admitting: Family Medicine

## 2017-09-02 VITALS — BP 120/84 | HR 76 | Temp 98.0°F | Resp 16 | Ht 63.0 in | Wt 147.8 lb

## 2017-09-02 DIAGNOSIS — F17200 Nicotine dependence, unspecified, uncomplicated: Secondary | ICD-10-CM

## 2017-09-02 DIAGNOSIS — E782 Mixed hyperlipidemia: Secondary | ICD-10-CM | POA: Diagnosis not present

## 2017-09-02 DIAGNOSIS — E663 Overweight: Secondary | ICD-10-CM

## 2017-09-02 DIAGNOSIS — R7303 Prediabetes: Secondary | ICD-10-CM | POA: Diagnosis not present

## 2017-09-02 LAB — MICROALBUMIN / CREATININE URINE RATIO
Creatinine, Urine: 55 mg/dL (ref 20–275)
MICROALB UR: 1.1 mg/dL
Microalb Creat Ratio: 20 mcg/mg creat (ref <30)

## 2017-09-02 MED ORDER — METFORMIN HCL 500 MG PO TABS
500.0000 mg | ORAL_TABLET | Freq: Two times a day (BID) | ORAL | 3 refills | Status: DC
Start: 2017-09-02 — End: 2018-09-15

## 2017-09-02 NOTE — Patient Instructions (Addendum)
F/U IN 4 MONTHS, XCALL IF YOU NEED ME SOONER  RETURN FOR FLU VACCCINE IN 2 WEEKS  It is important that you exercise regularly at least 30 minutes 7 times a week. If you develop chest pain, have severe difficulty breathing, or feel very tired, stop exercising immediately and seek medical attention   wORK ON QUIITING SMOKING, NEED TO QUIT  PLEASE SCHED YOUR MAMMOGRAM AND ALSO CHECK ON COVERAGE FOR LOW DOSE CHEST SCAN NEED THIS FOR LUNG CANCER SCREEN  INCREASE METFORMIN TO ONE TWICE DAILY  CUT BACK ON FATTY FOODS  FASTING LIPID, CMP AND EgfR AND Hba1c 1 WEEK BEFORE F/U  Thank you  for choosing [facility]. We consider it a privelige to serve you.  Delivering excellent health care in a caring and  compassionate way is our goal.  Partnering with you,  so that together we can achieve this goal is our strategy.      Steps to Quit Smoking Smoking tobacco can be bad for your health. It can also affect almost every organ in your body. Smoking puts you and people around you at risk for many serious long-lasting (chronic) diseases. Quitting smoking is hard, but it is one of the best things that you can do for your health. It is never too late to quit. What are the benefits of quitting smoking? When you quit smoking, you lower your risk for getting serious diseases and conditions. They can include:  Lung cancer or lung disease.  Heart disease.  Stroke.  Heart attack.  Not being able to have children (infertility).  Weak bones (osteoporosis) and broken bones (fractures).  If you have coughing, wheezing, and shortness of breath, those symptoms may get better when you quit. You may also get sick less often. If you are pregnant, quitting smoking can help to lower your chances of having a baby of low birth weight. What can I do to help me quit smoking? Talk with your doctor about what can help you quit smoking. Some things you can do (strategies) include:  Quitting smoking  totally, instead of slowly cutting back how much you smoke over a period of time.  Going to in-person counseling. You are more likely to quit if you go to many counseling sessions.  Using resources and support systems, such as: ? Online chats with a counselor. ? Phone quitlines. ? Printed self-help materials. ? Support groups or group counseling. ? Text messaging programs. ? Mobile phone apps or applications.  Taking medicines. Some of these medicines may have nicotine in them. If you are pregnant or breastfeeding, do not take any medicines to quit smoking unless your doctor says it is okay. Talk with your doctor about counseling or other things that can help you.  Talk with your doctor about using more than one strategy at the same time, such as taking medicines while you are also going to in-person counseling. This can help make quitting easier. What things can I do to make it easier to quit? Quitting smoking might feel very hard at first, but there is a lot that you can do to make it easier. Take these steps:  Talk to your family and friends. Ask them to support and encourage you.  Call phone quitlines, reach out to support groups, or work with a counselor.  Ask people who smoke to not smoke around you.  Avoid places that make you want (trigger) to smoke, such as: ? Bars. ? Parties. ? Smoke-break areas at work.  Spend   time with people who do not smoke.  Lower the stress in your life. Stress can make you want to smoke. Try these things to help your stress: ? Getting regular exercise. ? Deep-breathing exercises. ? Yoga. ? Meditating. ? Doing a body scan. To do this, close your eyes, focus on one area of your body at a time from head to toe, and notice which parts of your body are tense. Try to relax the muscles in those areas.  Download or buy apps on your mobile phone or tablet that can help you stick to your quit plan. There are many free apps, such as QuitGuide from the CDC  (Centers for Disease Control and Prevention). You can find more support from smokefree.gov and other websites.  This information is not intended to replace advice given to you by your health care provider. Make sure you discuss any questions you have with your health care provider. Document Released: 10/03/2009 Document Revised: 08/04/2016 Document Reviewed: 04/23/2015 Elsevier Interactive Patient Education  2018 Elsevier Inc.  

## 2017-09-04 NOTE — Assessment & Plan Note (Signed)

## 2017-09-04 NOTE — Progress Notes (Signed)
Andrea Simon     MRN: [id number]      DOB: Feb 26, 1956   HPI [name] is here for follow up and re-evaluation of chronic medical conditions, medication management and review of any available recent lab and radiology data.  Preventive health is updated, specifically  Cancer screening and Immunization.   Questions or concerns regarding consultations or procedures which the PT has had in the interim are  addressed. The PT denies any adverse reactions to current medications since the last visit.  Recently lost her mom to lung cancer after a long illness and is trying to cope with this as best as able  ROS Denies recent fever or chills. Denies sinus pressure, nasal congestion, ear pain or sore throat. Denies chest congestion, productive cough or wheezing. Denies chest pains, palpitations and leg swelling Denies abdominal pain, nausea, vomiting,diarrhea or constipation.   Denies dysuria, frequency, hesitancy or incontinence. Denies joint pain, swelling and limitation in mobility. Denies headaches, seizures, numbness, or tingling. Denies depression, anxiety or insomnia. Denies skin break down or rash.   PE  BP 120/84 (BP Location: Left Arm, Patient Position: Sitting, Cuff Size: Normal)   Pulse 76   Temp 98 F (36.7 C) (Other (Comment))   Resp 16   Ht 5\' 3"  (1.6 m)   Wt 147 lb 12 oz (67 kg)   SpO2 98%   BMI 26.17 kg/m   Patient alert and oriented and in no cardiopulmonary distress.  HEENT: No facial asymmetry, EOMI,   oropharynx pink and moist.  Neck supple no JVD, no mass.  Chest: Clear to auscultation bilaterally.  CVS: S1, S2 no murmurs, no S3.Regular rate.  ABD: Soft non tender.   Ext: No edema  MS: Adequate ROM spine, shoulders, hips and knees.  Skin: Intact, no ulcerations or rash noted.  Psych: Good eye contact, normal affect. Memory intact not anxious or depressed appearing.  CNS: CN 2-12 intact, power,  normal throughout.no focal deficits  noted.   Assessment & Plan  NICOTINE ADDICTION Patient is asked and  confirms current  Nicotine use.  Five to seven minutes of time is spent in counseling the patient of the need to quit smoking  Advice to quit is delivered clearly specifically in reducing the risk of developing heart disease, having a stroke, or of developing all types of cancer, especially lung and oral cancer. Improvement in breathing and exercise tolerance and quality of life is also discussed, as is the economic benefit.  Assessment of willingness to quit or to make an attempt to quit is made and documented  Assistance in quit attempt is made with several and varied options presented, based on patient's desire and need. These include  literature, local classes available, 1800 QUIT NOW number, OTC and prescription medication.  The GOAL to be NICOTINE FREE is re emphasized.  The patient has set a personal goal of either reduction or discontinuation and follow up is arranged between 6 an 16 weeks.    Overweight (BMI 25.0-29.9) Unchanged Patient re-educated about  the importance of commitment to a  minimum of 150 minutes of exercise per week.  The importance of healthy food choices with portion control discussed. Encouraged to start a food diary, count calories and to consider  joining a support group. Sample diet sheets offered. Goals set by the patient for the next several months.   Weight /BMI 09/02/2017 02/04/2017 08/18/2016  WEIGHT 147 lb 12 oz 149 lb 1.9 oz 148 lb  HEIGHT 5\' 3"   5\' 3"  5\' 3"   BMI 26.17 kg/m2 26.42 kg/m2 26.22 kg/m2      Prediabetes Patient educated about the importance of limiting  Carbohydrate intake , the need to commit to daily physical activity for a minimum of 30 minutes , and to commit weight loss. The fact that changes in all these areas will reduce or eliminate all together the development of diabetes is stressed.  Deteriorated increase metformin dose  Diabetic Labs Latest Ref Rng  & Units 09/02/2017 08/31/2017 01/04/2017 08/19/2016 08/15/2016  HbA1c 4.8 - 5.6 % - 6.3(H) 6.2(H) - 6.0(H)  Microalbumin mg/dL 1.1 - - 19.5(H) -  Micro/Creat Ratio <30 mcg/mg creat 20 - - 15.5 -  Chol 100 - 199 mg/dL - 203(H) 224(H) - 196  HDL >39 mg/dL - 35(L) 33(L) - 37(L)  Calc LDL 0 - 99 mg/dL - 125(H) 134(H) - 121  Triglycerides 0 - 149 mg/dL - 213(H) 283(H) - 189(H)  Creatinine 0.57 - 1.00 mg/dL - 0.76 0.84 - 0.86   BP/Weight 09/02/2017 02/04/2017 08/18/2016 01/13/2016 09/10/2015 05/06/2015 09/18/2445  Systolic BP 286 381 771 165 790 383 338  Diastolic BP 84 76 82 74 78 76 78  Wt. (Lbs) 147.75 149.12 148 144 153.12 155 155  BMI 26.17 26.42 26.22 25.51 27.13 27.46 27.46   Foot/eye exam completion dates Latest Ref Rng & Units 08/18/2016 12/03/2015  Eye Exam No Retinopathy - No Retinopathy  Foot Form Completion - Done -      Hyperlipemia Hyperlipidemia:Low fat diet discussed and encouraged.   Lipid Panel  Lab Results  Component Value Date   CHOL 203 (H) 08/31/2017   HDL 35 (L) 08/31/2017   LDLCALC 125 (H) 08/31/2017   TRIG 213 (H) 08/31/2017   CHOLHDL 5.3 (H) 08/15/2016  needs to change diet, refuses medication

## 2017-09-04 NOTE — Assessment & Plan Note (Signed)
Hyperlipidemia:Low fat diet discussed and encouraged.   Lipid Panel  Lab Results  Component Value Date   CHOL 203 (H) 08/31/2017   HDL 35 (L) 08/31/2017   LDLCALC 125 (H) 08/31/2017   TRIG 213 (H) 08/31/2017   CHOLHDL 5.3 (H) 08/15/2016  needs to change diet, refuses medication

## 2017-09-04 NOTE — Assessment & Plan Note (Signed)
Patient educated about the importance of limiting  Carbohydrate intake , the need to commit to daily physical activity for a minimum of 30 minutes , and to commit weight loss. The fact that changes in all these areas will reduce or eliminate all together the development of diabetes is stressed.  Deteriorated increase metformin dose  Diabetic Labs Latest Ref Rng & Units 09/02/2017 08/31/2017 01/04/2017 08/19/2016 08/15/2016  HbA1c 4.8 - 5.6 % - 6.3(H) 6.2(H) - 6.0(H)  Microalbumin mg/dL 1.1 - - 19.5(H) -  Micro/Creat Ratio <30 mcg/mg creat 20 - - 15.5 -  Chol 100 - 199 mg/dL - 203(H) 224(H) - 196  HDL >39 mg/dL - 35(L) 33(L) - 37(L)  Calc LDL 0 - 99 mg/dL - 125(H) 134(H) - 121  Triglycerides 0 - 149 mg/dL - 213(H) 283(H) - 189(H)  Creatinine 0.57 - 1.00 mg/dL - 0.76 0.84 - 0.86   BP/Weight 09/02/2017 02/04/2017 08/18/2016 01/13/2016 09/10/2015 05/06/2015 06/19/1600  Systolic BP 093 235 573 220 254 270 623  Diastolic BP 84 76 82 74 78 76 78  Wt. (Lbs) 147.75 149.12 148 144 153.12 155 155  BMI 26.17 26.42 26.22 25.51 27.13 27.46 27.46   Foot/eye exam completion dates Latest Ref Rng & Units 08/18/2016 12/03/2015  Eye Exam No Retinopathy - No Retinopathy  Foot Form Completion - Done -

## 2017-09-04 NOTE — Assessment & Plan Note (Signed)
Unchanged Patient re-educated about  the importance of commitment to a  minimum of 150 minutes of exercise per week.  The importance of healthy food choices with portion control discussed. Encouraged to start a food diary, count calories and to consider  joining a support group. Sample diet sheets offered. Goals set by the patient for the next several months.   Weight /BMI 09/02/2017 02/04/2017 08/18/2016  WEIGHT 147 lb 12 oz 149 lb 1.9 oz 148 lb  HEIGHT 5\' 3"  5\' 3"  5\' 3"   BMI 26.17 kg/m2 26.42 kg/m2 26.22 kg/m2

## 2017-11-25 ENCOUNTER — Other Ambulatory Visit: Payer: Self-pay | Admitting: Family Medicine

## 2017-11-26 NOTE — Telephone Encounter (Signed)
Seen [phone] 

## 2017-12-22 ENCOUNTER — Ambulatory Visit: Payer: 59 | Admitting: Family Medicine

## 2018-05-18 ENCOUNTER — Other Ambulatory Visit: Payer: Self-pay | Admitting: Family Medicine

## 2018-08-07 ENCOUNTER — Other Ambulatory Visit: Payer: Self-pay | Admitting: Family Medicine

## 2018-08-10 ENCOUNTER — Other Ambulatory Visit: Payer: Self-pay

## 2018-08-10 ENCOUNTER — Encounter: Payer: Self-pay | Admitting: Family Medicine

## 2018-08-10 ENCOUNTER — Ambulatory Visit (INDEPENDENT_AMBULATORY_CARE_PROVIDER_SITE_OTHER): Payer: 59 | Admitting: Family Medicine

## 2018-08-10 ENCOUNTER — Other Ambulatory Visit (HOSPITAL_COMMUNITY)
Admission: RE | Admit: 2018-08-10 | Discharge: 2018-08-10 | Disposition: A | Discharge status: Home / Self Care | Payer: 59 | Source: Ambulatory Visit | Attending: Family Medicine | Admitting: Family Medicine

## 2018-08-10 VITALS — BP 132/76 | HR 78 | Resp 12 | Ht 63.0 in | Wt 140.0 lb

## 2018-08-10 DIAGNOSIS — F17200 Nicotine dependence, unspecified, uncomplicated: Secondary | ICD-10-CM

## 2018-08-10 DIAGNOSIS — Z0000 Encounter for general adult medical examination without abnormal findings: Secondary | ICD-10-CM | POA: Insufficient documentation

## 2018-08-10 DIAGNOSIS — F4329 Adjustment disorder with other symptoms: Secondary | ICD-10-CM

## 2018-08-10 DIAGNOSIS — F17210 Nicotine dependence, cigarettes, uncomplicated: Secondary | ICD-10-CM | POA: Diagnosis not present

## 2018-08-10 DIAGNOSIS — Z122 Encounter for screening for malignant neoplasm of respiratory organs: Secondary | ICD-10-CM

## 2018-08-10 DIAGNOSIS — R7303 Prediabetes: Secondary | ICD-10-CM

## 2018-08-10 DIAGNOSIS — Z0100 Encounter for examination of eyes and vision without abnormal findings: Secondary | ICD-10-CM | POA: Diagnosis not present

## 2018-08-10 DIAGNOSIS — E782 Mixed hyperlipidemia: Secondary | ICD-10-CM | POA: Diagnosis not present

## 2018-08-10 DIAGNOSIS — Z801 Family history of malignant neoplasm of trachea, bronchus and lung: Secondary | ICD-10-CM

## 2018-08-10 DIAGNOSIS — E042 Nontoxic multinodular goiter: Secondary | ICD-10-CM | POA: Diagnosis not present

## 2018-08-10 DIAGNOSIS — Z1231 Encounter for screening mammogram for malignant neoplasm of breast: Secondary | ICD-10-CM

## 2018-08-10 DIAGNOSIS — Z1211 Encounter for screening for malignant neoplasm of colon: Secondary | ICD-10-CM

## 2018-08-10 DIAGNOSIS — E041 Nontoxic single thyroid nodule: Secondary | ICD-10-CM

## 2018-08-10 DIAGNOSIS — F4381 Prolonged grief disorder: Secondary | ICD-10-CM

## 2018-08-10 NOTE — Assessment & Plan Note (Signed)
Pt has a personal h/o of over 30 PPD cigarettes nicotine use , currently smoking , her mother died of lung cancer and she also has a 2nd family member with lung cancer needs screening lung scan , states when she checked before had $1500 co pay and unable to afford wants to re look at this once more Asked: confirms current use Assess: unwilling to quit at this time ADVISE: [place], ALSO HAS METABOLIC SYNDROME HENCE INCREASED CV RISK AS WELL AS STROKE RISK Assist: counseled for 5 mins, no interest in medication to assist in quitting currently Arrange : f/u in 4 months

## 2018-08-10 NOTE — Patient Instructions (Addendum)
F/U in January, call if you need me sooner please  Please schedule mammogram at time of checkout, this is overdue  Fasting labs at [facility] as soon as possible, in the next 1 week preferred CBC, lipid, cmp and eGFR, hBA1C , Tsh and vitmain D  Cologuard testing from home as colon cancer screen is being arranged and pap is sent today  You are being referred to telepsychiatry   You are being referred for chest scan for lung  Cancer screening  You are being referred for eye exam  You are being referred for thyroid US to re evaluate nodules  Please reconsider the flu vaccine which you need  Please work on cutting back on smoking , I know that ideally you DO want to quit, we can help you to get there!  Thank you  for choosing [facility]. We consider it a privelige to serve you.  Delivering excellent health care in a caring and  compassionate way is our goal.  Partnering with you,  so that together we can achieve this goal is our strategy.

## 2018-08-10 NOTE — Assessment & Plan Note (Signed)
thyromefaly with nodules, rept testing is past due

## 2018-08-10 NOTE — Progress Notes (Signed)
Andrea Simon     MRN: [id number]      DOB: 06/01/1956  HPI: Patient is in for annual physical exam. C/o prolonged grief 2 years after her Mother has passed from lung cancer still has episodes of crying spells , life is " not the same" is open to therapy Wants mammogram and chest scan if she can afford, for colon cancer screening at this time still is only opting for cologuard C/o floaters and requests eye exam referral  Still smoking , no change, not willing to committing to reduction at this time Immunization is reviewed , she refuses flu vaccine  PE: Pleasant  female, alert and oriented x 3, in no cardio-pulmonary distress. Afebrile. HEENT No facial trauma or asymetry. Sinuses non tender.  Extra occullar muscles intact External ears normal, tympanic membranes clear. Oropharynx moist, no exudate. Neck: supple, no adenopathy,JVD or bruits.Thyromegaly pesent  Chest: Clear to ascultation bilaterally.No crackles or wheezes. Non tender to palpation  Breast: No asymetry,no masses or lumps. No tenderness. No nipple discharge or inversion. No axillary or supraclavicular adenopathy  Cardiovascular system; Heart sounds normal,  S1 and  S2 ,no S3.  No murmur, or thrill. Apical beat not displaced Peripheral pulses normal.  Abdomen: Soft, non tender, no organomegaly or masses. No bruits. Bowel sounds normal. No guarding, tenderness or rebound.  Rectal: cologuard testing arranged GU: External genitalia normal female genitalia , normal female distribution of hair. No lesions. Urethral meatus normal in size, no  Prolapse, no lesions visibly  Present. Bladder non tender. Vagina pink and moist , with no visible lesions , discharge present . Adequate pelvic support no  cystocele or rectocele noted Cervix pink and appears healthy, no lesions or ulcerations noted, no discharge noted from os Uterus normal size, no adnexal masses, no cervical motion or adnexal  tenderness.   Musculoskeletal exam: Full ROM of spine, hips , shoulders and knees. No deformity ,swelling or crepitus noted. No muscle wasting or atrophy.   Neurologic: Cranial nerves 2 to 12 intact. Power, tone ,sensation and reflexes normal throughout. No disturbance in gait. No tremor.  Skin: Intact, no ulceration, erythema , scaling or rash noted. Pigmentation normal throughout  Psych; Normal mood and affect. Judgement and concentration normal   Assessment & Plan:  Annual physical exam Annual exam as documented. Counseling done  re healthy lifestyle involving commitment to 150 minutes exercise per week, heart healthy diet, and attaining healthy weight.The importance of adequate sleep also discussed. Regular seat belt use and home safety, is also discussed. Changes in health habits are decided on by the patient with goals and time frames  set for achieving them. Immunization and cancer screening needs are specifically addressed at this visit.   Multinodular goiter thyromefaly with nodules, rept testing is past due  NICOTINE ADDICTION Pt has a personal h/o of over 30 PPD cigarettes nicotine use , currently smoking , her mother died of lung cancer and she also has a 2nd family member with lung cancer needs screening lung scan , states when she checked before had $1500 co pay and unable to afford wants to re look at this once more Asked: confirms current use Assess: unwilling to quit at this time ADVISE: [place], ALSO HAS METABOLIC SYNDROME HENCE INCREASED CV RISK AS WELL AS STROKE RISK Assist: counseled for 5 mins, no interest in medication to assist in quitting currently Arrange : f/u in 4 months  Prolonged grief reaction Pt  Not suicidal or homicidal however states life has changed , and she unexpectedly has crying spells, open to therapy which I believe will be helpful, referred to tele therapy

## 2018-08-10 NOTE — Assessment & Plan Note (Signed)

## 2018-08-10 NOTE — Assessment & Plan Note (Signed)
Pt  Not suicidal or homicidal however states life has changed , and she unexpectedly has crying spells, open to therapy which I believe will be helpful, referred to tele therapy

## 2018-08-11 ENCOUNTER — Telehealth: Payer: Self-pay

## 2018-08-11 NOTE — Telephone Encounter (Signed)
[facility] - Phone just rang ,unable to leave a message.

## 2018-08-15 ENCOUNTER — Encounter: Payer: Self-pay | Admitting: Family Medicine

## 2018-08-15 LAB — CYTOLOGY - PAP
Diagnosis: NEGATIVE
HPV (WINDOPATH): NOT DETECTED

## 2018-08-18 ENCOUNTER — Encounter (HOSPITAL_COMMUNITY): Payer: Self-pay

## 2018-08-18 ENCOUNTER — Ambulatory Visit (HOSPITAL_COMMUNITY)
Admission: RE | Admit: 2018-08-18 | Discharge: 2018-08-18 | Disposition: A | Discharge status: Home / Self Care | Payer: 59 | Source: Ambulatory Visit | Attending: Family Medicine | Admitting: Family Medicine

## 2018-08-18 DIAGNOSIS — Z1231 Encounter for screening mammogram for malignant neoplasm of breast: Secondary | ICD-10-CM | POA: Insufficient documentation

## 2018-08-18 NOTE — Telephone Encounter (Signed)
[facility] - Left Msg 

## 2018-08-19 ENCOUNTER — Encounter: Payer: Self-pay | Admitting: Family Medicine

## 2018-08-19 ENCOUNTER — Other Ambulatory Visit: Payer: Self-pay | Admitting: Family Medicine

## 2018-08-19 ENCOUNTER — Ambulatory Visit (HOSPITAL_COMMUNITY)
Admission: RE | Admit: 2018-08-19 | Discharge: 2018-08-19 | Disposition: A | Discharge status: Home / Self Care | Payer: 59 | Source: Ambulatory Visit | Attending: Family Medicine | Admitting: Family Medicine

## 2018-08-19 DIAGNOSIS — E042 Nontoxic multinodular goiter: Secondary | ICD-10-CM | POA: Insufficient documentation

## 2018-08-19 LAB — CBC
HEMATOCRIT: 37.2 % (ref 34.0–46.6)
HEMOGLOBIN: 13.5 g/dL (ref 11.1–15.9)
MCH: 37.8 pg — ABNORMAL HIGH (ref 26.6–33.0)
MCHC: 36.3 g/dL — ABNORMAL HIGH (ref 31.5–35.7)
MCV: 104 fL — ABNORMAL HIGH (ref 79–97)
Platelets: 259 10*3/uL (ref 150–450)
RBC: 3.57 x10E6/uL — AB (ref 3.77–5.28)
RDW: 13 % (ref 12.3–15.4)
WBC: 6.1 10*3/uL (ref 3.4–10.8)

## 2018-08-19 LAB — LIPID PANEL
CHOL/HDL RATIO: 5.9 ratio — AB (ref 0.0–4.4)
CHOLESTEROL TOTAL: 189 mg/dL (ref 100–199)
HDL: 32 mg/dL — AB (ref >39)
LDL CALC: 112 mg/dL — AB (ref 0–99)
TRIGLYCERIDES: 224 mg/dL — AB (ref 0–149)
VLDL Cholesterol Cal: 45 mg/dL — ABNORMAL HIGH (ref 5–40)

## 2018-08-19 LAB — TSH: TSH: 3.22 u[IU]/mL (ref 0.450–4.500)

## 2018-08-19 LAB — VITAMIN D 25 HYDROXY (VIT D DEFICIENCY, FRACTURES): Vit D, 25-Hydroxy: 14.4 ng/mL — ABNORMAL LOW (ref 30.0–100.0)

## 2018-08-19 MED ORDER — ERGOCALCIFEROL 1.25 MG (50000 UT) PO CAPS: 50000.0000 [IU] | ORAL_CAPSULE | ORAL | 1 refills | Status: DC

## 2018-08-21 ENCOUNTER — Encounter: Payer: Self-pay | Admitting: Family Medicine

## 2018-08-21 ENCOUNTER — Other Ambulatory Visit: Payer: Self-pay | Admitting: Family Medicine

## 2018-08-21 DIAGNOSIS — E041 Nontoxic single thyroid nodule: Secondary | ICD-10-CM

## 2018-08-23 ENCOUNTER — Encounter: Payer: Self-pay | Admitting: Family Medicine

## 2018-08-30 ENCOUNTER — Ambulatory Visit (HOSPITAL_COMMUNITY): Payer: 59

## 2018-08-30 ENCOUNTER — Telehealth: Payer: Self-pay

## 2018-08-30 NOTE — Telephone Encounter (Signed)
[facility] - The patient reports that this was a bad time for her to talk and she woud call me back at [phone]

## 2018-08-31 LAB — CMP14+EGFR
ALT: 19 IU/L (ref 0–32)
AST: 18 IU/L (ref 0–40)
Albumin/Globulin Ratio: 1.9 (ref 1.2–2.2)
Albumin: 4.6 g/dL (ref 3.6–4.8)
Alkaline Phosphatase: 69 IU/L (ref 39–117)
BUN/Creatinine Ratio: 13 (ref 12–28)
BUN: 10 mg/dL (ref 8–27)
Bilirubin Total: 0.7 mg/dL (ref 0.0–1.2)
CO2: 21 mmol/L (ref 20–29)
Calcium: 9.8 mg/dL (ref 8.7–10.3)
Chloride: 102 mmol/L (ref 96–106)
Creatinine, Ser: 0.75 mg/dL (ref 0.57–1.00)
GFR calc Af Amer: 100 mL/min/1.73
GFR calc non Af Amer: 87 mL/min/1.73
Globulin, Total: 2.4 g/dL (ref 1.5–4.5)
Glucose: 110 mg/dL — ABNORMAL HIGH (ref 65–99)
Potassium: 4.1 mmol/L (ref 3.5–5.2)
Sodium: 142 mmol/L (ref 134–144)
Total Protein: 7 g/dL (ref 6.0–8.5)

## 2018-08-31 LAB — HGB A1C W/O EAG

## 2018-08-31 LAB — SPECIMEN STATUS REPORT

## 2018-09-02 ENCOUNTER — Ambulatory Visit (HOSPITAL_COMMUNITY)
Admission: RE | Admit: 2018-09-02 | Discharge: 2018-09-02 | Disposition: A | Discharge status: Home / Self Care | Payer: 59 | Source: Ambulatory Visit | Attending: Family Medicine | Admitting: Family Medicine

## 2018-09-02 DIAGNOSIS — F17210 Nicotine dependence, cigarettes, uncomplicated: Secondary | ICD-10-CM | POA: Insufficient documentation

## 2018-09-02 DIAGNOSIS — I2510 Atherosclerotic heart disease of native coronary artery without angina pectoris: Secondary | ICD-10-CM | POA: Diagnosis not present

## 2018-09-02 DIAGNOSIS — I700 Atherosclerosis of aorta: Secondary | ICD-10-CM | POA: Insufficient documentation

## 2018-09-02 DIAGNOSIS — Z801 Family history of malignant neoplasm of trachea, bronchus and lung: Secondary | ICD-10-CM | POA: Diagnosis not present

## 2018-09-02 DIAGNOSIS — Z122 Encounter for screening for malignant neoplasm of respiratory organs: Secondary | ICD-10-CM | POA: Diagnosis not present

## 2018-09-02 DIAGNOSIS — R918 Other nonspecific abnormal finding of lung field: Secondary | ICD-10-CM | POA: Diagnosis not present

## 2018-09-02 DIAGNOSIS — J439 Emphysema, unspecified: Secondary | ICD-10-CM | POA: Diagnosis not present

## 2018-09-02 DIAGNOSIS — F17200 Nicotine dependence, unspecified, uncomplicated: Secondary | ICD-10-CM

## 2018-09-05 ENCOUNTER — Telehealth: Payer: Self-pay

## 2018-09-05 NOTE — Telephone Encounter (Signed)
Several attempts have been made to contact patient without success. Patient will be placed on the inactive list.  If services are needed again.  Please contact [facility] at [phone].    Information will be routed to the PCP and Dr. Hisada  

## 2018-09-07 ENCOUNTER — Telehealth: Payer: Self-pay | Admitting: Family Medicine

## 2018-09-07 NOTE — Telephone Encounter (Signed)
Spoke w patient and advised of all thyroid test results with verbal understanding.

## 2018-09-07 NOTE — Telephone Encounter (Signed)
Patient states you & her have been playing phone tag & she received another letter to contact you. Cb# [phone]

## 2018-09-15 ENCOUNTER — Other Ambulatory Visit: Payer: Self-pay | Admitting: Family Medicine

## 2018-09-25 ENCOUNTER — Encounter: Payer: Self-pay | Admitting: Family Medicine

## 2018-09-25 LAB — COLOGUARD

## 2018-09-30 ENCOUNTER — Telehealth: Payer: Self-pay | Admitting: Family Medicine

## 2018-09-30 ENCOUNTER — Other Ambulatory Visit: Payer: Self-pay | Admitting: Family Medicine

## 2018-09-30 DIAGNOSIS — R195 Other fecal abnormalities: Secondary | ICD-10-CM

## 2018-09-30 DIAGNOSIS — F17210 Nicotine dependence, cigarettes, uncomplicated: Secondary | ICD-10-CM

## 2018-09-30 NOTE — Progress Notes (Signed)
amb gastro  

## 2018-09-30 NOTE — Telephone Encounter (Signed)
I spoke  Directly with the pt and made her aware that her cologuard test is positive, so she needs a colonoscopy, she states she wants Dr Laural Golden to do this so I will refer. States that we have to physically call in and get approval for all referrals through her insurance company, said she asked Dusty to make a note of this on her record, states she can be contacted on her cell, please call for any clarification needed and document. I have ordered the referral and will also message Dr Laural Golden

## 2018-10-12 NOTE — Telephone Encounter (Signed)
FYI  - Pt has appt 10/13/18

## 2018-10-13 ENCOUNTER — Ambulatory Visit (INDEPENDENT_AMBULATORY_CARE_PROVIDER_SITE_OTHER): Payer: 59 | Admitting: Internal Medicine

## 2018-10-13 ENCOUNTER — Telehealth: Payer: Self-pay | Admitting: Family Medicine

## 2018-10-13 NOTE — Telephone Encounter (Signed)
Pt states that the charge on the face cream is 90.00 is there something cheaper

## 2018-10-14 NOTE — Telephone Encounter (Signed)
No face cream is prescribed, please ask patient to spell the medication she is asking about, and send the info to me again

## 2018-10-14 NOTE — Telephone Encounter (Signed)
error 

## 2018-10-27 ENCOUNTER — Ambulatory Visit (INDEPENDENT_AMBULATORY_CARE_PROVIDER_SITE_OTHER): Payer: 59 | Admitting: Otolaryngology

## 2018-10-27 DIAGNOSIS — D440 Neoplasm of uncertain behavior of thyroid gland: Secondary | ICD-10-CM | POA: Diagnosis not present

## 2018-10-28 ENCOUNTER — Other Ambulatory Visit (INDEPENDENT_AMBULATORY_CARE_PROVIDER_SITE_OTHER): Payer: Self-pay | Admitting: Otolaryngology

## 2018-10-28 DIAGNOSIS — E041 Nontoxic single thyroid nodule: Secondary | ICD-10-CM

## 2018-10-31 ENCOUNTER — Encounter (INDEPENDENT_AMBULATORY_CARE_PROVIDER_SITE_OTHER): Payer: Self-pay | Admitting: Internal Medicine

## 2018-10-31 ENCOUNTER — Encounter (INDEPENDENT_AMBULATORY_CARE_PROVIDER_SITE_OTHER): Payer: Self-pay | Admitting: *Deleted

## 2018-10-31 ENCOUNTER — Telehealth (INDEPENDENT_AMBULATORY_CARE_PROVIDER_SITE_OTHER): Payer: Self-pay | Admitting: *Deleted

## 2018-10-31 ENCOUNTER — Ambulatory Visit (INDEPENDENT_AMBULATORY_CARE_PROVIDER_SITE_OTHER): Payer: 59 | Admitting: Internal Medicine

## 2018-10-31 VITALS — BP 150/80 | HR 68 | Temp 98.3°F | Ht 63.0 in | Wt 133.3 lb

## 2018-10-31 DIAGNOSIS — R195 Other fecal abnormalities: Secondary | ICD-10-CM | POA: Diagnosis not present

## 2018-10-31 MED ORDER — SUPREP BOWEL PREP KIT 17.5-3.13-1.6 GM/177ML PO SOLN: 1.0000 | Freq: Once | ORAL | 0 refills | Status: AC

## 2018-10-31 NOTE — Telephone Encounter (Signed)
Patient needs suprep 

## 2018-10-31 NOTE — Progress Notes (Signed)
   Subjective:    Patient ID: Andrea Simon, female    DOB: Mar 22, 1956, 63 y.o.   MRN: [id number]  HPI Referred by Dr. Moshe Cipro for positive cologuard. No family hx of colon cancer. No changes in her stool. No melena or BRRB. Appetite is good. Weight loss of 5 pounds which was intentional. No abdominal pain.  Has never undergone a colonoscopy in the past.  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 Review of Systems Past Medical History:  Diagnosis Date  . Hyperlipidemia     Past Surgical History:  Procedure Laterality Date  . DILATION AND CURETTAGE OF UTERUS  2006  . FNA thyroid  2011   benign  . TUBAL LIGATION  1992    No Known Allergies  Current Outpatient Medications on File Prior to Visit  Medication Sig Dispense Refill  . aspirin EC 81 MG tablet Take 81 mg by mouth daily.    . Calcium Carbonate-Vitamin D (CALCIUM + D PO) Take by mouth.    . cholecalciferol (VITAMIN D3) 25 MCG (1000 UT) tablet Take 400 Units by mouth daily.    . metFORMIN (GLUCOPHAGE) 500 MG tablet TAKE 1 TABLET BY MOUTH TWO  TIMES DAILY WITH A MEAL 180 tablet 2  . pravastatin (PRAVACHOL) 40 MG tablet TAKE 1 TABLET BY MOUTH  DAILY IN THE EVENING 90 tablet 0   No current facility-administered medications on file prior to visit.         Objective:   Physical Exam Blood pressure (!) 150/80, pulse 68, temperature 98.3 F (36.8 C), height 5\' 3"  (1.6 m), weight 133 lb 4.8 oz (60.5 kg). Alert and oriented. Skin warm and dry. Oral mucosa is moist.   . Sclera anicteric, conjunctivae is pink. Thyroid not enlarged. No cervical lymphadenopathy. Lungs clear. Heart regular rate and rhythm.  Abdomen is soft. Bowel sounds are positive. No hepatomegaly. No abdominal masses felt. No tenderness.  No edema to lower extremities.           Assessment & Plan:  Positive cologuard: Colonic neoplasm needs to be ruled out.  Polyp, AVM in differential.

## 2018-10-31 NOTE — Patient Instructions (Signed)
The risks of bleeding, perforation and infection were reviewed with patient.  

## 2018-11-08 ENCOUNTER — Other Ambulatory Visit: Payer: Self-pay | Admitting: Family Medicine

## 2018-11-08 LAB — HM DIABETES EYE EXAM

## 2018-11-20 DIAGNOSIS — C801 Malignant (primary) neoplasm, unspecified: Secondary | ICD-10-CM

## 2018-11-20 HISTORY — DX: Malignant (primary) neoplasm, unspecified: C80.1

## 2018-11-23 ENCOUNTER — Encounter (HOSPITAL_COMMUNITY): Payer: Self-pay

## 2018-11-23 ENCOUNTER — Ambulatory Visit (HOSPITAL_COMMUNITY)
Admission: RE | Admit: 2018-11-23 | Discharge: 2018-11-23 | Disposition: A | Discharge status: Home / Self Care | Payer: 59 | Source: Ambulatory Visit | Attending: Otolaryngology | Admitting: Otolaryngology

## 2018-11-23 DIAGNOSIS — E041 Nontoxic single thyroid nodule: Secondary | ICD-10-CM | POA: Diagnosis present

## 2018-11-23 MED ORDER — LIDOCAINE HCL (PF) 2 % IJ SOLN
INTRAMUSCULAR | Status: AC
  Filled 2018-11-23: qty 20

## 2018-11-23 NOTE — Procedures (Signed)
Uncomplicated US guided right mid thyroid nodule FNA in 5 passes with 25G.  JWatts MD

## 2018-12-07 ENCOUNTER — Ambulatory Visit (HOSPITAL_COMMUNITY)
Admission: RE | Admit: 2018-12-07 | Discharge: 2018-12-07 | Disposition: A | Discharge status: Home / Self Care | Payer: 59 | Attending: Internal Medicine | Admitting: Internal Medicine

## 2018-12-07 ENCOUNTER — Encounter (HOSPITAL_COMMUNITY)
Admission: RE | Disposition: A | Discharge status: Home / Self Care | Payer: Self-pay | Source: Home / Self Care | Attending: Internal Medicine

## 2018-12-07 ENCOUNTER — Encounter (HOSPITAL_COMMUNITY): Payer: Self-pay | Admitting: *Deleted

## 2018-12-07 ENCOUNTER — Other Ambulatory Visit: Payer: Self-pay

## 2018-12-07 DIAGNOSIS — C185 Malignant neoplasm of splenic flexure: Secondary | ICD-10-CM | POA: Diagnosis not present

## 2018-12-07 DIAGNOSIS — Z1211 Encounter for screening for malignant neoplasm of colon: Secondary | ICD-10-CM | POA: Insufficient documentation

## 2018-12-07 DIAGNOSIS — F17210 Nicotine dependence, cigarettes, uncomplicated: Secondary | ICD-10-CM | POA: Diagnosis not present

## 2018-12-07 DIAGNOSIS — Z801 Family history of malignant neoplasm of trachea, bronchus and lung: Secondary | ICD-10-CM | POA: Insufficient documentation

## 2018-12-07 DIAGNOSIS — Z79899 Other long term (current) drug therapy: Secondary | ICD-10-CM | POA: Diagnosis not present

## 2018-12-07 DIAGNOSIS — C187 Malignant neoplasm of sigmoid colon: Secondary | ICD-10-CM | POA: Insufficient documentation

## 2018-12-07 DIAGNOSIS — E785 Hyperlipidemia, unspecified: Secondary | ICD-10-CM | POA: Insufficient documentation

## 2018-12-07 DIAGNOSIS — Z7984 Long term (current) use of oral hypoglycemic drugs: Secondary | ICD-10-CM | POA: Diagnosis not present

## 2018-12-07 DIAGNOSIS — Z8249 Family history of ischemic heart disease and other diseases of the circulatory system: Secondary | ICD-10-CM | POA: Diagnosis not present

## 2018-12-07 DIAGNOSIS — Z7982 Long term (current) use of aspirin: Secondary | ICD-10-CM | POA: Insufficient documentation

## 2018-12-07 DIAGNOSIS — K644 Residual hemorrhoidal skin tags: Secondary | ICD-10-CM | POA: Insufficient documentation

## 2018-12-07 DIAGNOSIS — R195 Other fecal abnormalities: Secondary | ICD-10-CM

## 2018-12-07 DIAGNOSIS — K6289 Other specified diseases of anus and rectum: Secondary | ICD-10-CM | POA: Diagnosis not present

## 2018-12-07 DIAGNOSIS — D125 Benign neoplasm of sigmoid colon: Secondary | ICD-10-CM | POA: Diagnosis not present

## 2018-12-07 HISTORY — PX: COLONOSCOPY: SHX5424

## 2018-12-07 HISTORY — PX: BIOPSY: SHX5522

## 2018-12-07 HISTORY — PX: POLYPECTOMY: SHX5525

## 2018-12-07 LAB — GLUCOSE, CAPILLARY: GLUCOSE-CAPILLARY: 96 mg/dL (ref 70–99)

## 2018-12-07 SURGERY — COLONOSCOPY
Anesthesia: Moderate Sedation

## 2018-12-07 MED ORDER — MIDAZOLAM HCL 5 MG/5ML IJ SOLN
INTRAMUSCULAR | Status: DC | PRN
  Administered 2018-12-07: 2 mg via INTRAVENOUS
  Administered 2018-12-07: 1 mg via INTRAVENOUS
  Administered 2018-12-07: 2 mg via INTRAVENOUS
  Administered 2018-12-07: 1 mg via INTRAVENOUS

## 2018-12-07 MED ORDER — STERILE WATER FOR IRRIGATION IR SOLN
Status: DC | PRN
  Administered 2018-12-07: 1.5 mL

## 2018-12-07 MED ORDER — SODIUM CHLORIDE 0.9 % IV SOLN
INTRAVENOUS | Status: DC
  Administered 2018-12-07: 1000 mL via INTRAVENOUS

## 2018-12-07 MED ORDER — MEPERIDINE HCL 50 MG/ML IJ SOLN
INTRAMUSCULAR | Status: AC
  Filled 2018-12-07: qty 1

## 2018-12-07 MED ORDER — MIDAZOLAM HCL 5 MG/5ML IJ SOLN
INTRAMUSCULAR | Status: AC
  Filled 2018-12-07: qty 10

## 2018-12-07 MED ORDER — MEPERIDINE HCL 50 MG/ML IJ SOLN
INTRAMUSCULAR | Status: DC | PRN
  Administered 2018-12-07 (×2): 25 mg via INTRAVENOUS

## 2018-12-07 NOTE — Op Note (Signed)
[facility] Patient Name: Andrea Simon Procedure Date: 12/07/2018 1:10 PM MRN: [id number] Date of Birth: 1956/08/07 Attending MD: Hildred Laser , MD CSN: [id number] Age: 62 Admit Type: Outpatient Procedure:                Colonoscopy Indications:              Positive Cologuard test Providers:                Hildred Laser, MD, Charlsie Quest. Theda Sers RN, RN, Nelma Rothman, Technician Referring MD:             Norwood Levo. Moshe Cipro, MD Medicines:                Meperidine 50 mg IV, Midazolam 6 mg IV Complications:            No immediate complications. Estimated Blood Loss:     Estimated blood loss was minimal. Procedure:                Pre-Anesthesia Assessment:                           - Prior to the procedure, a History and Physical                            was performed, and patient medications and                            allergies were reviewed. [name] tolerance of                            previous anesthesia was also reviewed. The risks                            and benefits of the procedure and the sedation                            options and risks were discussed with the patient.                            All questions were answered, and informed consent                            was obtained. Prior Anticoagulants: The patient                            last took aspirin 3 days prior to the procedure.                            ASA Grade Assessment: II - A patient with mild                            systemic disease. After reviewing the risks and  benefits, the patient was deemed in satisfactory                            condition to undergo the procedure.                           After obtaining informed consent, the colonoscope                            was passed under direct vision. Throughout the                            procedure, the patient's blood pressure, pulse, and       oxygen saturations were monitored continuously. The                            PCF-H190DL ([id number]) scope was introduced through                            the anus and advanced to the the cecum, identified                            by appendiceal orifice and ileocecal valve. The                            colonoscopy was performed without difficulty. The                            patient tolerated the procedure well. The quality                            of the bowel preparation was good. The ileocecal                            valve, appendiceal orifice, and rectum were                            photographed. Scope In: 1:36:13 PM Scope Out: 2:06:01 PM Scope Withdrawal Time: 0 hours 16 minutes 32 seconds  Total Procedure Duration: 0 hours 29 minutes 48 seconds  Findings:      Skin tags were found on perianal exam.      A fungating, polypoid and ulcerated non-obstructing large mass was found       at the splenic flexure. The mass was partially circumferential       (involving two-thirds of the lumen circumference). The mass measured       four cm in length. No bleeding was present. This was biopsied with a       cold forceps for histology. The pathology specimen was placed into       Bottle Number 2.      A small polyp was found in the proximal sigmoid colon. The polyp was       sessile. The polyp was removed with a cold snare. Resection and       retrieval were complete. The pathology specimen was placed into Bottle  Number 1.      The exam was otherwise normal throughout the examined colon.      External hemorrhoids were found during retroflexion. The hemorrhoids       were small.      Anal papilla(e) were hypertrophied. Impression:               - Perianal skin tags found on perianal exam.                           - Malignant tumor at the splenic flexure. Biopsied.                           - One small polyp in the proximal sigmoid colon,                             removed with a cold snare. Resected and retrieved.                           - External hemorrhoids.                           - Anal papilla(e) were hypertrophied. Moderate Sedation:      Moderate (conscious) sedation was administered by the endoscopy nurse       and supervised by the endoscopist. The following parameters were       monitored: oxygen saturation, heart rate, blood pressure, CO2       capnography and response to care. Total physician intraservice time was       37 minutes. Recommendation:           - Patient has a contact number available for                            emergencies. The signs and symptoms of potential                            delayed complications were discussed with the                            patient. Return to normal activities tomorrow.                            Written discharge instructions were provided to the                            patient.                           - Resume previous diet today.                           - Continue present medications.                           - Await pathology results.                           -  No aspirin, ibuprofen, naproxen, or other                            non-steroidal anti-inflammatory drugs for 1 day.                           - Await pathology results.                           - Repeat colonoscopy is recommended for                            surveillance; timing to be determined. Procedure Code(s):        --- Professional ---                           [phone], Colonoscopy, flexible; with removal of                            tumor(s), polyp(s), or other lesion(s) by snare                            technique                           45380, 59, Colonoscopy, flexible; with biopsy,                            single or multiple                           99153, Moderate sedation; each additional 15                            minutes intraservice time                           G0500, Moderate  sedation services provided by the                            same physician or other qualified health care                            professional performing a gastrointestinal                            endoscopic service that sedation supports,                            requiring the presence of an independent trained                            observer to assist in the monitoring of the                            patient's level of consciousness and physiological  status; initial 15 minutes of intra-service time;                            patient age 71 years or older (additional time may                            be reported with [phone], as appropriate) Diagnosis Code(s):        --- Professional ---                           C18.5, Malignant neoplasm of splenic flexure                           D12.5, Benign neoplasm of sigmoid colon                           K62.89, Other specified diseases of anus and rectum                           K64.4, Residual hemorrhoidal skin tags                           R19.5, Other fecal abnormalities CPT copyright 2018 [employer]. All rights reserved. The codes documented in this report are preliminary and upon coder review may  be revised to meet current compliance requirements. Hildred Laser, MD Hildred Laser, MD 12/07/2018 2:21:33 PM This report has been signed electronically. Number of Addenda: 0

## 2018-12-07 NOTE — Discharge Instructions (Signed)
Resume aspirin on 12/08/2018. Resume other medications as before. Resume usual diet. No driving for 24 hours. Physician will call with biopsy results.   Colonoscopy, Adult, Care After This sheet gives you information about how to care for yourself after your procedure. Your doctor may also give you more specific instructions. If you have problems or questions, call your doctor. What can I expect after the procedure? After the procedure, it is common to have:  A small amount of blood in your poop for 24 hours.  Some gas.  Mild cramping or bloating in your belly. Follow these instructions at home: General instructions  For the first 24 hours after the procedure: ? Do not drive or use machinery. ? Do not sign important documents. ? Do not drink alcohol. ? Do your daily activities more slowly than normal. ? Eat foods that are soft and easy to digest.  Take over-the-counter or prescription medicines only as told by your doctor. To help cramping and bloating:   Try walking around.  Put heat on your belly (abdomen) as told by your doctor. Use a heat source that your doctor recommends, such as a moist heat pack or a heating pad. ? Put a towel between your skin and the heat source. ? Leave the heat on for 20-30 minutes. ? Remove the heat if your skin turns bright red. This is especially important if you cannot feel pain, heat, or cold. You can get burned. Eating and drinking   Drink enough fluid to keep your pee (urine) clear or pale yellow.  Return to your normal diet as told by your doctor. Avoid heavy or fried foods that are hard to digest.  Avoid drinking alcohol for as long as told by your doctor. Contact a doctor if:  You have blood in your poop (stool) 2-3 days after the procedure. Get help right away if:  You have more than a small amount of blood in your poop.  You see large clumps of tissue (blood clots) in your poop.  Your belly is swollen.  You feel sick to  your stomach (nauseous).  You throw up (vomit).  You have a fever.  You have belly pain that gets worse, and medicine does not help your pain. Summary  After the procedure, it is common to have a small amount of blood in your poop. You may also have mild cramping and bloating in your belly.  For the first 24 hours after the procedure, do not drive or use machinery, do not sign important documents, and do not drink alcohol.  Get help right away if you have a lot of blood in your poop, feel sick to your stomach, have a fever, or have more belly pain. This information is not intended to replace advice given to you by your health care provider. Make sure you discuss any questions you have with your health care provider. Document Released: 01/09/2011 Document Revised: 10/07/2017 Document Reviewed: 08/31/2016 Elsevier Interactive Patient Education  2019 [employer].

## 2018-12-07 NOTE — OR Nursing (Signed)
Andrea Simon was at [facility] on 12/07/18 for a procedure and cannot return to work until 12/08/18 at 3pm.

## 2018-12-07 NOTE — H&P (Signed)
Andrea Simon is an 62 y.o. female.   Chief Complaint: Patient is here for colonoscopy. HPI: Patient 62 year old African-American female went Cologuard test as a screening tool and was positive.  She is therefore here for diagnostic colonoscopy.  She denies abdominal pain change in bowel habits or rectal bleeding.  She has never undergone colonoscopy.  He has good appetite her weight is stable. Family history is negative for CRC.  Past Medical History:  Diagnosis Date  . Hyperlipidemia     Past Surgical History:  Procedure Laterality Date  . DILATION AND CURETTAGE OF UTERUS  2006  . FNA thyroid  2011   benign  . TUBAL LIGATION  1992    Family History  Problem Relation Age of Onset  . Hypertension Mother        AAA  . Coronary artery disease Mother   . Hyperlipidemia Mother   . Cancer Mother        lung  . Hypertension Father    Social History:  reports that she has been smoking cigarettes. She has been smoking about 0.75 packs per day. She has never used smokeless tobacco. She reports current alcohol use. She reports that she does not use drugs.  Allergies: No Known Allergies  Medications Prior to Admission  Medication Sig Dispense Refill  . aspirin EC 81 MG tablet Take 81 mg by mouth every evening.     . Calcium Carbonate-Vitamin D (CALCIUM 600+D) 600-400 MG-UNIT tablet Take 1 tablet by mouth 2 (two) times a week. Sunday and Wednesday evenings    . metFORMIN (GLUCOPHAGE) 500 MG tablet TAKE 1 TABLET BY MOUTH TWO  TIMES DAILY WITH A MEAL (Patient taking differently: Take 500 mg by mouth 2 (two) times daily with a meal. ) 180 tablet 2  . pravastatin (PRAVACHOL) 40 MG tablet TAKE 1 TABLET BY MOUTH  DAILY IN THE EVENING (Patient taking differently: Take 40 mg by mouth every evening. ) 90 tablet 0  . acetaminophen (TYLENOL) 325 MG tablet Take 650 mg by mouth daily as needed for moderate pain or headache.    . naproxen sodium (ALEVE) 220 MG tablet Take 220 mg by mouth daily as  needed (pain).      Results for orders placed or performed during the hospital encounter of 12/07/18 (from the past 48 hour(s))  Glucose, capillary     Status: None   Collection Time: 12/07/18 12:44 PM  Result Value Ref Range   Glucose-Capillary 96 70 - 99 mg/dL   No results found.  ROS  Blood pressure (!) 141/74, pulse 65, temperature 97.9 F (36.6 C), temperature source Oral, resp. rate 13, height 5\' 3"  (1.6 m), weight 59 kg, SpO2 100 %. Physical Exam  Constitutional: She appears well-developed and well-nourished.  HENT:  Mouth/Throat: Oropharynx is clear and moist.  Eyes: Conjunctivae are normal. No scleral icterus.  Neck: No thyromegaly present.  Cardiovascular: Normal rate, regular rhythm and normal heart sounds.  No murmur heard. Respiratory: Effort normal and breath sounds normal.  GI: Soft. She exhibits no distension and no mass. There is no abdominal tenderness.  Musculoskeletal:        General: No edema.  Lymphadenopathy:    She has no cervical adenopathy.  Neurological: She is alert.  Skin: Skin is warm and dry.     Assessment/Plan  Positive Cologuard test. Diagnostic colonoscopy.  Hildred Laser, MD 12/07/2018, 1:26 PM

## 2018-12-12 ENCOUNTER — Encounter (HOSPITAL_COMMUNITY): Payer: Self-pay | Admitting: Internal Medicine

## 2018-12-15 ENCOUNTER — Other Ambulatory Visit (INDEPENDENT_AMBULATORY_CARE_PROVIDER_SITE_OTHER): Payer: Self-pay | Admitting: Internal Medicine

## 2018-12-15 DIAGNOSIS — C189 Malignant neoplasm of colon, unspecified: Secondary | ICD-10-CM

## 2018-12-16 ENCOUNTER — Ambulatory Visit (HOSPITAL_COMMUNITY)
Admission: RE | Admit: 2018-12-16 | Discharge: 2018-12-16 | Disposition: A | Discharge status: Home / Self Care | Payer: 59 | Source: Ambulatory Visit | Attending: Internal Medicine | Admitting: Internal Medicine

## 2018-12-16 DIAGNOSIS — C189 Malignant neoplasm of colon, unspecified: Secondary | ICD-10-CM | POA: Diagnosis present

## 2018-12-16 LAB — POCT I-STAT CREATININE: Creatinine, Ser: 0.8 mg/dL (ref 0.44–1.00)

## 2018-12-16 MED ORDER — IOPAMIDOL (ISOVUE-300) INJECTION 61%
100.0000 mL | Freq: Once | INTRAVENOUS | Status: AC | PRN
  Administered 2018-12-16: 100 mL via INTRAVENOUS

## 2018-12-22 ENCOUNTER — Encounter: Payer: Self-pay | Admitting: General Surgery

## 2018-12-22 ENCOUNTER — Ambulatory Visit (INDEPENDENT_AMBULATORY_CARE_PROVIDER_SITE_OTHER): Payer: 59 | Admitting: General Surgery

## 2018-12-22 VITALS — BP 147/82 | HR 84 | Temp 97.7°F | Resp 16 | Wt 133.8 lb

## 2018-12-22 DIAGNOSIS — C184 Malignant neoplasm of transverse colon: Secondary | ICD-10-CM | POA: Diagnosis not present

## 2018-12-22 MED ORDER — NEOMYCIN SULFATE 500 MG PO TABS: 1000.0000 mg | ORAL_TABLET | ORAL | 0 refills | Status: DC

## 2018-12-22 MED ORDER — METRONIDAZOLE 500 MG PO TABS: 1000.0000 mg | ORAL_TABLET | ORAL | 0 refills | Status: DC

## 2018-12-22 NOTE — Patient Instructions (Addendum)
Possible Surgery Dates: 1/15, 1/20, 1/22, 1/27, 1/29   Colon Prep Buy from the Store: Miralax bottle (288g).  Gatorade 64 oz (not red). Dulcolax tablets.   The Day Prior to Surgery: Take 4 ducolax tablets at 7am with water. Drink plenty of clear liquids all day to avoid dehydration, no solid food.    Mix the bottle of Miralax and 64 oz of Gatorade and drink this mixture starting at 10am. Drink it gradually over the next few hours, 8 ounces every 15-30 minutes until it is gone. Finish this by 2pm.  Take 2 neomycin 500mg  tablets and 2 metronidazole 500mg  tablets at 2 pm. Take 2 neomycin 500mg  tablets and 2 metronidazole 500mg  tablets at 3pm. Take 2 neomycin 500mg  tablets and 2 metronidazole 500mg  tablets at 10pm.    Do not eat or drink anything after midnight the night before your surgery.  Do not eat or drink anything that morning, and take medications as instructed by the hospital staff on your preoperative visit.   Laparoscopic / Laparoscopic hand assisted Colectomy Laparoscopic colectomy is surgery to remove part or all of the large intestine (colon). This procedure may be used to treat several conditions, including:  Inflammation and infection of the colon (diverticulitis).  Tumors or masses in the colon.  Inflammatory bowel disease, such as Crohn disease or ulcerative colitis. Colectomy is an option when symptoms cannot be controlled with medicines.  Bleeding from the colon that cannot be controlled by another method.  Blockage or obstruction of the colon. Tell a health care provider about:  Any allergies you have.  All medicines you are taking, including vitamins, herbs, eye drops, creams, and over-the-counter medicines.  Any problems you or family members have had with anesthetic medicines.  Any blood disorders you have.  Any surgeries you have had.  Any medical conditions you have. What are the risks? Generally, this is a safe procedure. However, problems may  occur, including:  Infection.  Bleeding.  Allergic reactions to medicines or dyes.  Damage to other structures or organs.  Leaking from where the colon was sewn together.  Future blockage of the small intestines from scar tissue. Another surgery may be needed to repair this.  Needing to convert to an open procedure. Complications such as damage to other organs or excessive bleeding may require the surgeon to convert from a laparoscopic procedure to an open procedure. This involves making a larger incision in the abdomen. Medicines  Ask your health care provider about: ? Changing or stopping your regular medicines. This is especially important if you are taking diabetes medicines or blood thinners. ? Taking medicines such as aspirin and ibuprofen. These medicines can thin your blood. Do not take these medicines before your procedure if your health care provider instructs you not to.  You may be given antibiotic medicine to clean out bacteria from your colon. Follow the directions carefully and take the medicine at the correct time. General instructions  You may be prescribed an oral bowel prep to clean out your colon in preparation for the surgery: ? Follow instructions from your health care provider about how to do this. ? Do not eat or drink anything else after you have started the bowel prep, unless your health care provider tells you it is safe to do so.  Do not use any products that contain nicotine or tobacco, such as cigarettes and e-cigarettes. If you need help quitting, ask your health care provider. What happens during the procedure?  To reduce your  risk of infection: ? Your health care team will wash or sanitize their hands. ? Your skin will be washed with soap.  An IV tube will be inserted into one of your veins to deliver fluid and medication.  You will be given one of the following: ? A medicine to help you relax (sedative). ? A medicine to make you fall asleep  (general anesthetic).  Small monitors will be connected to your body. They will be used to check your heart, blood pressure, and oxygen level.  A breathing tube may be placed into your lungs during the procedure.  A thin, flexible tube (catheter) will be placed into your bladder to drain urine.  A tube may be placed through your nose and into your stomach to drain stomach fluids (nasogastric tube, or NG tube).  Your abdomen will be filled with air so it expands. This gives the surgeon more room to operate and makes your organs easier to see.  Several small cuts (incisions) will be made in your abdomen.  A thin, lighted tube with a tiny camera on the end (laparoscope) will be put through one of the small incisions. The camera on the laparoscope will send a picture to a computer screen in the operating room. This will give the surgeon a good view inside your abdomen.  Hollow tubes will be put through the other small incisions in your abdomen. The tools that are needed for the procedure will be put through these tubes.  Clamps or staples will be put on both ends of the diseased part of the colon.  The part of the intestine between the clamps or staples will be removed.  If possible, the ends of the healthy colon that remain will be stitched (sutured) or stapled together to allow your body to pass waste (stool).  Sometimes, the remaining colon cannot be stitched back together. If this is the case, a colostomy will be needed. If you need a colostomy: ? An opening to the outside of your body (stoma) will be made through your abdomen. ? The end of your colon will be brought to the opening. It will be stitched to the skin. ? A bag will be attached to the opening. Stool will drain into this removable bag. ? The colostomy may be temporary or permanent.  The incisions from the colectomy will be closed with sutures or staples. The procedure may vary among health care providers and hospitals. What  happens after the procedure?  Your blood pressure, heart rate, breathing rate, and blood oxygen level will be monitored until the medicines you were given have worn off.  You will receive fluids through an IV tube until your bowels start to work properly.  Once your bowels are working again, you will be given clear liquids first and then solid food as tolerated.  You will be given medicines to control your pain and nausea, if needed.  Do not drive for 24 hours if you were given a sedative. This information is not intended to replace advice given to you by your health care provider. Make sure you discuss any questions you have with your health care provider. Document Released: 02/27/2003 Document Revised: 09/07/2016 Document Reviewed: 09/07/2016 Elsevier Interactive Patient Education  2019 [employer].   Colorectal Cancer  Colorectal cancer is an abnormal growth of cells and tissue (tumor) in the colon or rectum, which are parts of the large intestine. The cancer can spread (metastasize) to other parts of the body. What are the causes?  Most cases of colorectal cancer start as abnormal growths called polyps on the inner wall of the colon or rectum. Other times, abnormal changes to genes (genetic mutations) can cause cells to form cancer. What increases the risk? You are more likely to develop this condition if:  You are older than age 24.  You have multiple polyps in the colon or rectum.  You have diabetes.  You are African American.  You have a family history of Lynch syndrome.  You have had cancer before.  You have certain hereditary conditions, such as: ? Familial adenomatous polyposis. ? Turcot syndrome. ? Peutz-Jeghers syndrome.  You eat a diet that is high in fat (especially animal fat) and low in fiber, fruits, and vegetables.  You have an inactive (sedentary) lifestyle.  You have an inflammatory bowel disease or Crohn's disease.  You smoke.  You drink alcohol  excessively. What are the signs or symptoms? Early colorectal cancer often does not cause symptoms. As the cancer grows, symptoms may include:  Changes in bowel habits.  Feeling like the bowel does not empty completely after a bowel movement.  Stools that are narrower than usual.  Blood in the stool.  Diarrhea.  Constipation.  Anemia.  Discomfort, pain, bloating, fullness, or cramps in the abdomen.  Frequent gas pain.  Unexplained weight loss.  Constant fatigue.  Nausea and vomiting. How is this diagnosed? This condition may be diagnosed with:  A medical history.  A physical exam.  Tests. These may include: ? A digital rectal exam. ? A stool test called a fecal occult blood test. ? Blood tests. ? A test in which a tissue sample is taken from the colon or rectum and examined under a microscope (biopsy). ? Imaging tests, such as:  X-rays.  A barium enema.  CT scans.  MRIs.  A sigmoidoscopy. This test is done to view the inside of the rectum.  A colonoscopy. This test is done to view the inside of the colon. During this test, small polyps can be removed or biopsies may be taken.  An endorectal ultrasound. This test checks how deep a tumor in the rectum has grown and whether the cancer has spread to lymph nodes or other nearby tissues. Your health care provider may order additional tests to find out whether the cancer has spread to other parts of the body (what stage it is). The stages of cancer include:  Stage 0. At this stage, the cancer is found only in the innermost lining of the colon or rectum.  Stage I. At this stage, the cancer has grown into the inner wall of the colon or rectum.  Stage II. At this stage, the cancer has gone more deeply into the wall of the colon or rectum or through the wall. It may have invaded nearby tissue.  Stage III. At this stage, the cancer has spread to nearby lymph nodes.  Stage IV. At this stage, the cancer has spread to  other parts of the body, such as the liver or lungs. How is this treated? Treatment for this condition depends on the type and stage of the cancer. Treatment may include:  Surgery. In the early stages of the cancer, surgery may be done to remove polyps or small tumors from the colon. In later stages, surgery may be done to remove part of the colon.  Chemotherapy. This treatment uses medicines to kill cancer cells.  Targeted therapy. This treatment targets specific gene mutations or proteins that the cancer expresses in  order to kill tumor cells.  Radiation therapy. This treatment uses radiation to kill cancer cells or shrink tumors.  Radiofrequency ablation. This treatment uses radio waves to destroy the tumors that may have spread to other areas of the body, such as the liver. Follow these instructions at home:  Take over-the-counter and prescription medicines only as told by your health care provider.  Try to eat regular, healthy meals. Some of your treatments might affect your appetite. If you are having problems eating or with your appetite, ask to meet with a food and nutrition specialist (dietitian).  Consider joining a support group. This may help you learn about your diagnosis and cope with the stress of having colorectal cancer.  If you are admitted to the hospital, inform your cancer care team.  Keep all follow-up visits as told by your health care provider. This is important. How is this prevented?  Colorectal cancer can be prevented with screening tests that find polyps so they can be removed before they develop into cancer.  All adults should have screening for colorectal cancer starting at age 70 and continuing until age 51. Your health care provider may recommend screening at age 17. People at increased risk should start screening at an earlier age. Where to find more information  American Cancer Society: https://www.cancer.[place]):  https://www.cancer.gov Contact a health care provider if:  Your diarrhea or constipation does not go away.  You have blood in your stool.  Your bowel habits change.  You have increased pain in your abdomen.  You notice new fatigue or weakness.  You lose weight. Get help right away if:  You have increased bleeding from your rectum.  You have any uncontrollable or severe abdomen (abdominal) symptoms. Summary  Colorectal cancer is an abnormal growth of cells and tissue (tumor) in the colon or rectum.  Common risk factors for this condition include having a relative with colon cancer, being older in age, having an inflammatory bowel disease, and being African American.  This condition may be diagnosed with tests, such as a colonoscopy and biopsy.  Treatment depends on the type and stage of the cancer. Commonly, treatment includes surgery to remove the tumor along with chemotherapy or targeted therapy.  Keep all follow-up visits as told by your health care provider. This is important. This information is not intended to replace advice given to you by your health care provider. Make sure you discuss any questions you have with your health care provider. Document Released: 12/07/2005 Document Revised: 01/27/2018 Document Reviewed: 01/08/2017 Elsevier Interactive Patient Education  2019 [employer].

## 2018-12-22 NOTE — Progress Notes (Signed)
[facility] History and Physical  Reason for Referral: Colon Cancer  Referring Physician:  Dr. Laural Golden   Chief Complaint    Colon Cancer      Andrea Simon is a 63 y.o. female.  HPI: Andrea Simon is a 63 yo who is in good health and follows with Dr. Moshe Cipro. She had went for her normal annual exam and was screened for colon cancer with the cologuard test. This result was positive and she was referred to Dr. Laural Golden. She reported no melena or BRBPR and no unintentional weight loss. She had otherwise been in her normal state of health leading up to this diagnosis. She reports regular BMs and has no family history of colon cancer.    Dr. Laural Golden performed a colonoscopy and noted a mass in what he thought was the splenic flexure. This was biopsied and came back poorly differentiated adenocarcinoma. She underwent a staging CT scan and this demonstrates the lesion more in the transverse colon area and possible enlarged nodes.   Colonoscopy: -Perianal skin tags found on perianal exam. - Malignant tumor at the splenic flexure. Biopsied. - One small polyp in the proximal sigmoid colon, removed with a cold snare. Resected and retrieved. - External hemorrhoids. - Anal papilla(e) were hypertrophied.  Past Medical History:  Diagnosis Date  . Hyperlipidemia   . Multinodular goiter   . Prediabetes     Past Surgical History:  Procedure Laterality Date  . BIOPSY  12/07/2018   Procedure: BIOPSY;  Surgeon: Rogene Houston, MD;  Location: [facility];  Service: Endoscopy;;  sigmoid colon  . COLONOSCOPY N/A 12/07/2018   Procedure: COLONOSCOPY;  Surgeon: Rogene Houston, MD;  Location: [facility];  Service: Endoscopy;  Laterality: N/A;  12:45  . DILATION AND CURETTAGE OF UTERUS  2006  . FNA thyroid  2011   benign  . POLYPECTOMY  12/07/2018   Procedure: POLYPECTOMY;  Surgeon: Rogene Houston, MD;  Location: [facility];  Service: Endoscopy;;  splenic flexure (CS x1)  .  TUBAL LIGATION  1992    Family History  Problem Relation Age of Onset  . Hypertension Mother        AAA  . Coronary artery disease Mother   . Hyperlipidemia Mother   . Cancer Mother        lung  . Hypertension Father     Social History   Tobacco Use  . Smoking status: Current Every Day Smoker    Packs/day: 0.75    Types: Cigarettes  . Smokeless tobacco: Never Used  Substance Use Topics  . Alcohol use: Yes    Alcohol/week: 0.0 standard drinks    Comment: occasionally  . Drug use: No    Comment: Ocassionally     Medications: I have reviewed the patient's current medications. Allergies as of 12/22/2018   No Known Allergies     Medication List       Accurate as of December 22, 2018 11:59 PM. Always use your most recent med list.        acetaminophen 325 MG tablet Commonly known as:  TYLENOL Take 650 mg by mouth daily as needed for moderate pain or headache.   aspirin EC 81 MG tablet Take 1 tablet (81 mg total) by mouth every evening.   CALCIUM 600+D 600-400 MG-UNIT tablet Generic drug:  Calcium Carbonate-Vitamin D Take 1 tablet by mouth 2 (two) times a week. Sunday and Wednesday evenings   metFORMIN 500 MG tablet Commonly known  as:  GLUCOPHAGE TAKE 1 TABLET BY MOUTH TWO  TIMES DAILY WITH A MEAL   metroNIDAZOLE 500 MG tablet Commonly known as:  FLAGYL Take 2 tablets (1,000 mg total) by mouth as directed. Take 2 metronidazole 500mg  tablets at 2 pm, at 3pm, at 10pm.   naproxen sodium 220 MG tablet Commonly known as:  ALEVE Take 220 mg by mouth daily as needed (pain).   neomycin 500 MG tablet Commonly known as:  MYCIFRADIN Take 2 tablets (1,000 mg total) by mouth as directed. Take 2 neomycin 500mg  tablets at 2 pm, at 3pm, at 10pm.   pravastatin 40 MG tablet Commonly known as:  PRAVACHOL TAKE 1 TABLET BY MOUTH  DAILY IN THE EVENING        ROS:  A comprehensive review of systems was negative except for: Eyes: positive for floaters in field of  vision Musculoskeletal: positive for back pain  Blood pressure (!) 147/82, pulse 84, temperature 97.7 F (36.5 C), temperature source Temporal, resp. rate 16, weight 133 lb 12.8 oz (60.7 kg). Physical Exam Vitals signs reviewed.  Constitutional:      Appearance: Normal appearance.  HENT:     Head: Normocephalic and atraumatic.     Nose: Nose normal.     Mouth/Throat:     Mouth: Mucous membranes are moist.  Eyes:     Extraocular Movements: Extraocular movements intact.     Pupils: Pupils are equal, round, and reactive to light.  Neck:     Musculoskeletal: Normal range of motion.  Cardiovascular:     Rate and Rhythm: Normal rate and regular rhythm.     Heart sounds: Normal heart sounds.  Pulmonary:     Effort: Pulmonary effort is normal.     Breath sounds: Normal breath sounds.  Abdominal:     General: Abdomen is flat. There is no distension.     Palpations: Abdomen is soft. There is no mass.     Tenderness: There is no abdominal tenderness.     Hernia: No hernia is present.  Musculoskeletal: Normal range of motion.        General: No swelling.  Skin:    General: Skin is warm and dry.  Neurological:     General: No focal deficit present.     Mental Status: She is alert and oriented to person, place, and time.  Psychiatric:        Mood and Affect: Mood normal.        Behavior: Behavior normal.        Thought Content: Thought content normal.        Judgment: Judgment normal.    Personally reviewed- redundant transverse colon, area of mass in the proximal transverse on CT scan, some nodes possible but no liver mets  Results: CT a/p 11/2018 IMPRESSION: 1. 4.2 x 2.9 cm near circumferential apple-core type mass involving the proximal transverse colon. A few small lymph nodes are noted in the pericolonic fat but no retroperitoneal adenopathy or evidence of hepatic metastatic disease. 2. Markedly enlarged fibroid uterus. 3. Single gallstone in the gallbladder without  definite findings for acute cholecystitis but there is fluid or edema in the adjacent liver. Recommend correlation with right upper quadrant ultrasound examination.  Assessment & Plan:  Andrea Simon is a 63 y.o. female with invasive adenocarcinoma of the transverse colon.  She has undergone a CT a/p that demonstrates this lesion and possible small nodes in the area.  We discussed this cancer and the needed for surgery.  We discussed the possibility of laparoscopic / laparoscopic hand assisted surgery and that she will likely end up with an extended right hemicolectomy due to the location in the proximal transverse colon. We discussed that the risk of surgery including but not limited to bleeding, infection, leak, injury to other organs, injury to the ureter, unlikely need for an ostomy given the right sided cancer, possible needing to open, and potential need for chemotherapy based on the final pathology and Stage.   -OR in the upcoming weeks for laparoscopic hand assisted partial colectomy -Patient wants to discuss with her family and look at her schedule   -Bowel preparation instructions given and Rx written  -Once patient has decided on a date, I will call her back and re-discuss surgery and consent her for blood given the type of surgery   All questions were answered to the satisfaction of the patient.  Colon Preparation:  Buy from the Store: Miralax bottle [phone]).  Gatorade 64 oz (not red). Dulcolax tablets.   The Day Prior to Surgery: Take 4 ducolax tablets at 7am with water. Drink plenty of clear liquids all day to avoid dehydration, no solid food.    Mix the bottle of Miralax and 64 oz of Gatorade and drink this mixture starting at 10am. Drink it gradually over the next few hours, 8 ounces every 15-30 minutes until it is gone. Finish this by 2pm.  Take 2 neomycin 500mg  tablets and 2 metronidazole 500mg  tablets at 2 pm. Take 2 neomycin 500mg  tablets and 2 metronidazole 500mg   tablets at 3pm. Take 2 neomycin 500mg  tablets and 2 metronidazole 500mg  tablets at 10pm.    Do not eat or drink anything after midnight the night before your surgery.  Do not eat or drink anything that morning, and take medications as instructed by the hospital staff on your preoperative visit.    Virl Cagey 12/23/2018, 3:34 PM

## 2018-12-23 ENCOUNTER — Encounter: Payer: Self-pay | Admitting: General Surgery

## 2018-12-28 NOTE — Patient Instructions (Signed)
Andrea Simon  12/28/2018     @PREFPERIOPPHARMACY @   Your procedure is scheduled on  01/09/19  Report to [facility] at  825  A.M.  Call this number if you have problems the morning of surgery:  [phone]   Remember:  Do not eat or drink after midnight.  Follow any diet and prep instructions given to you by Dr Constance Haw.                        Take these medicines the morning of surgery with A SIP OF WATER None    Do not wear jewelry, make-up or nail polish.  Do not wear lotions, powders, or perfumes, or deodorant.  Do not shave 48 hours prior to surgery.  Men may shave face and neck.  Do not bring valuables to the hospital.  [facility] is not responsible for any belongings or valuables.  Contacts, dentures or bridgework may not be worn into surgery.  Leave your suitcase in the car.  After surgery it may be brought to your room.  For patients admitted to the hospital, discharge time will be determined by your treatment team.  Patients discharged the day of surgery will not be allowed to drive home.   Name and phone number of your driver:   family Special instructions:  None  Please read over the following fact sheets that you were given. Anesthesia Post-op Instructions and Care and Recovery After Surgery       Laparoscopic Colectomy Laparoscopic colectomy is surgery to remove part or all of the large intestine (colon). This procedure may be used to treat several conditions, including:  Inflammation and infection of the colon (diverticulitis).  Tumors or masses in the colon.  Inflammatory bowel disease, such as Crohn disease or ulcerative colitis. Colectomy is an option when symptoms cannot be controlled with medicines.  Bleeding from the colon that cannot be controlled by another method.  Blockage or obstruction of the colon. Tell a health care provider about:  Any allergies you have.  All medicines you are taking, including vitamins,  herbs, eye drops, creams, and over-the-counter medicines.  Any problems you or family members have had with anesthetic medicines.  Any blood disorders you have.  Any surgeries you have had.  Any medical conditions you have. What are the risks? Generally, this is a safe procedure. However, problems may occur, including:  Infection.  Bleeding.  Allergic reactions to medicines or dyes.  Damage to other structures or organs.  Leaking from where the colon was sewn together.  Future blockage of the small intestines from scar tissue. Another surgery may be needed to repair this.  Needing to convert to an open procedure. Complications such as damage to other organs or excessive bleeding may require the surgeon to convert from a laparoscopic procedure to an open procedure. This involves making a larger incision in the abdomen. What happens before the procedure? Staying hydrated Follow instructions from your health care provider about hydration, which may include:  Up to 2 hours before the procedure - you may continue to drink clear liquids, such as water, clear fruit juice, black coffee, and plain tea. Eating and drinking restrictions Follow instructions from your health care provider about eating and drinking, which may include:  8 hours before the procedure - stop eating heavy meals, meals with high fiber, or foods such as meat, fried foods, or fatty  foods.  6 hours before the procedure - stop eating light meals or foods, such as toast or cereal.  6 hours before the procedure - stop drinking milk or drinks that contain milk.  2 hours before the procedure - stop drinking clear liquids. Medicines  Ask your health care provider about: ? Changing or stopping your regular medicines. This is especially important if you are taking diabetes medicines or blood thinners. ? Taking medicines such as aspirin and ibuprofen. These medicines can thin your blood. Do not take these medicines  before your procedure if your health care provider instructs you not to.  You may be given antibiotic medicine to clean out bacteria from your colon. Follow the directions carefully and take the medicine at the correct time. General instructions  You may be prescribed an oral bowel prep to clean out your colon in preparation for the surgery: ? Follow instructions from your health care provider about how to do this. ? Do not eat or drink anything else after you have started the bowel prep, unless your health care provider tells you it is safe to do so.  Do not use any products that contain nicotine or tobacco, such as cigarettes and e-cigarettes. If you need help quitting, ask your health care provider. What happens during the procedure?  To reduce your risk of infection: ? Your health care team will wash or sanitize their hands. ? Your skin will be washed with soap.  An IV tube will be inserted into one of your veins to deliver fluid and medication.  You will be given one of the following: ? A medicine to help you relax (sedative). ? A medicine to make you fall asleep (general anesthetic).  Small monitors will be connected to your body. They will be used to check your heart, blood pressure, and oxygen level.  A breathing tube may be placed into your lungs during the procedure.  A thin, flexible tube (catheter) will be placed into your bladder to drain urine.  A tube may be placed through your nose and into your stomach to drain stomach fluids (nasogastric tube, or NG tube).  Your abdomen will be filled with air so it expands. This gives the surgeon more room to operate and makes your organs easier to see.  Several small cuts (incisions) will be made in your abdomen.  A thin, lighted tube with a tiny camera on the end (laparoscope) will be put through one of the small incisions. The camera on the laparoscope will send a picture to a computer screen in the operating room. This will  give the surgeon a good view inside your abdomen.  Hollow tubes will be put through the other small incisions in your abdomen. The tools that are needed for the procedure will be put through these tubes.  Clamps or staples will be put on both ends of the diseased part of the colon.  The part of the intestine between the clamps or staples will be removed.  If possible, the ends of the healthy colon that remain will be stitched (sutured) or stapled together to allow your body to pass waste (stool).  Sometimes, the remaining colon cannot be stitched back together. If this is the case, a colostomy will be needed. If you need a colostomy: ? An opening to the outside of your body (stoma) will be made through your abdomen. ? The end of your colon will be brought to the opening. It will be stitched to the skin. ?  A bag will be attached to the opening. Stool will drain into this removable bag. ? The colostomy may be temporary or permanent.  The incisions from the colectomy will be closed with sutures or staples. The procedure may vary among health care providers and hospitals. What happens after the procedure?  Your blood pressure, heart rate, breathing rate, and blood oxygen level will be monitored until the medicines you were given have worn off.  You will receive fluids through an IV tube until your bowels start to work properly.  Once your bowels are working again, you will be given clear liquids first and then solid food as tolerated.  You will be given medicines to control your pain and nausea, if needed.  Do not drive for 24 hours if you were given a sedative. This information is not intended to replace advice given to you by your health care provider. Make sure you discuss any questions you have with your health care provider. Document Released: 02/27/2003 Document Revised: 09/07/2016 Document Reviewed: 09/07/2016 Elsevier Interactive Patient Education  2019 [employer]  Laparoscopic Colectomy, Care After This sheet gives you information about how to care for yourself after your procedure. Your health care provider may also give you more specific instructions. If you have problems or questions, contact your health care provider. What can I expect after the procedure? After your procedure, it is common to have the following:  Pain in your abdomen, especially in the incision areas. You will be given medicine to control the pain.  Tiredness. This is a normal part of the recovery process. Your energy level will return to normal over the next several weeks.  Changes in your bowel movements, such as constipation or needing to go more often. Talk with your health care provider about how to manage this. Follow these instructions at home: Medicines  Take over-the-counter and prescription medicines only as told by your health care provider.  Do not drive or use heavy machinery while taking prescription pain medicine.  Do not drink alcohol while taking prescription pain medicine.  If you were prescribed an antibiotic medicine, use it as told by your health care provider. Do not stop using the antibiotic even if you start to feel better. Incision care   Follow instructions from your health care provider about how to take care of your incision areas. Make sure you: ? Keep your incisions clean and dry. ? Wash your hands with soap and water before and after applying medicine to the areas, and before and after changing your bandage (dressing). If soap and water are not available, use hand sanitizer. ? Change your dressing as told by your health care provider. ? Leave stitches (sutures), skin glue, or adhesive strips in place. These skin closures may need to stay in place for 2 weeks or longer. If adhesive strip edges start to loosen and curl up, you may trim the loose edges. Do not remove adhesive strips completely unless your health care provider tells you to do  that.  Do not wear tight clothing over the incisions. Tight clothing may rub and irritate the incision areas, which may cause the incisions to open.  Do not take baths, swim, or use a hot tub until your health care provider approves. Ask your health care provider if you can take showers. You may only be allowed to take sponge baths for bathing.  Check your incision area every day for signs of infection. Check for: ? More redness, swelling, or pain. ?  More fluid or blood. ? Warmth. ? Pus or a bad smell. Activity  Avoid lifting anything that is heavier than 10 lb (4.5 kg) for 2 weeks or until your health care provider says it is okay.  You may resume normal activities as told by your health care provider. Ask your health care provider what activities are safe for you.  Take rest breaks during the day as needed. Eating and drinking  Follow instructions from your health care provider about what you can eat after surgery.  To prevent or treat constipation while you are taking prescription pain medicine, your health care provider may recommend that you: ? Drink enough fluid to keep your urine clear or pale yellow. ? Take over-the-counter or prescription medicines. ? Eat foods that are high in fiber, such as fresh fruits and vegetables, whole grains, and beans. ? Limit foods that are high in fat and processed sugars, such as fried and sweet foods. General instructions  Ask your health care provider when you will need an appointment to get your sutures or staples removed.  Keep all follow-up visits as told by your health care provider. This is important. Contact a health care provider if:  You have more redness, swelling, or pain around your incisions.  You have more fluid or blood coming from the incisions.  Your incisions feel warm to the touch.  You have pus or a bad smell coming from your incisions or your dressing.  You have a fever.  You have an incision that breaks open  (edges not staying together) after sutures or staples have been removed. Get help right away if:  You develop a rash.  You have chest pain or difficulty breathing.  You have pain or swelling in your legs.  You feel light-headed or you faint.  Your abdomen swells (becomes distended).  You have nausea or vomiting.  You have blood in your stool (feces). This information is not intended to replace advice given to you by your health care provider. Make sure you discuss any questions you have with your health care provider. Document Released: 06/26/2005 Document Revised: 08/26/2018 Document Reviewed: 09/07/2016 Elsevier Interactive Patient Education  2019 [place] Anesthesia, Adult General anesthesia is the use of medicines to make a person "go to sleep" (unconscious) for a medical procedure. General anesthesia must be used for certain procedures, and is often recommended for procedures that:  Last a long time.  Require you to be still or in an unusual position.  Are major and can cause blood loss. The medicines used for general anesthesia are called general anesthetics. As well as making you unconscious for a certain amount of time, these medicines:  Prevent pain.  Control your blood pressure.  Relax your muscles. Tell a health care provider about:  Any allergies you have.  All medicines you are taking, including vitamins, herbs, eye drops, creams, and over-the-counter medicines.  Any problems you or family members have had with anesthetic medicines.  Types of anesthetics you have had in the past.  Any blood disorders you have.  Any surgeries you have had.  Any medical conditions you have.  Any recent upper respiratory, chest, or ear infections.  Any history of: ? Heart or lung conditions, such as heart failure, sleep apnea, asthma, or chronic obstructive pulmonary disease (COPD). ? Armed forces logistics/support/administrative officer. ? Depression or anxiety.  Any tobacco or drug  use, including marijuana or alcohol use.  Whether you are pregnant or may be pregnant. What are  the risks? Generally, this is a safe procedure. However, problems may occur, including:  Allergic reaction.  Lung and heart problems.  Inhaling food or liquid from the stomach into the lungs (aspiration).  Nerve injury.  Dental injury.  Air in the bloodstream, which can lead to stroke.  Extreme agitation or confusion (delirium) when you wake up from the anesthetic.  Waking up during your procedure and being unable to move. This is rare. These problems are more likely to develop if you are having a major surgery or if you have an advanced or serious medical condition. You can prevent some of these complications by answering all of your health care provider's questions thoroughly and by following all instructions before your procedure. General anesthesia can cause side effects, including:  Nausea or vomiting.  A sore throat from the breathing tube.  Hoarseness.  Wheezing or coughing.  Shaking chills.  Tiredness.  Body aches.  Anxiety.  Sleepiness or drowsiness.  Confusion or agitation. What happens before the procedure? Staying hydrated Follow instructions from your health care provider about hydration, which may include:  Up to 2 hours before the procedure - you may continue to drink clear liquids, such as water, clear fruit juice, black coffee, and plain tea.  Eating and drinking restrictions Follow instructions from your health care provider about eating and drinking, which may include:  8 hours before the procedure - stop eating heavy meals or foods such as meat, fried foods, or fatty foods.  6 hours before the procedure - stop eating light meals or foods, such as toast or cereal.  6 hours before the procedure - stop drinking milk or drinks that contain milk.  2 hours before the procedure - stop drinking clear liquids. Medicines Ask your health care provider  about:  Changing or stopping your regular medicines. This is especially important if you are taking diabetes medicines or blood thinners.  Taking medicines such as aspirin and ibuprofen. These medicines can thin your blood. Do not take these medicines unless your health care provider tells you to take them.  Taking over-the-counter medicines, vitamins, herbs, and supplements. Do not take these during the week before your procedure unless your health care provider approves them. General instructions  Starting 3-6 weeks before the procedure, do not use any products that contain nicotine or tobacco, such as cigarettes and e-cigarettes. If you need help quitting, ask your health care provider.  If you brush your teeth on the morning of the procedure, make sure to spit out all of the toothpaste.  Tell your health care provider if you become ill or develop a cold, cough, or fever.  If instructed by your health care provider, bring your sleep apnea device with you on the day of your surgery (if applicable).  Ask your health care provider if you will be going home the same day, the following day, or after a longer hospital stay. ? Plan to have someone take you home from the hospital or clinic. ? Plan to have a responsible adult care for you for at least 24 hours after you leave the hospital or clinic. This is important. What happens during the procedure?   You will be given anesthetics through both of the following: ? A mask placed over your nose and mouth. ? An IV in one of your veins.  You may receive a medicine to help you relax (sedative).  After you are unconscious, a breathing tube may be inserted down your throat to help you breathe.  This will be removed before you wake up.  An anesthesia specialist will stay with you throughout your procedure. He or she will: ? Keep you comfortable and safe by continuing to give you medicines and adjusting the amount of medicine that you  get. ? Monitor your blood pressure, pulse, and oxygen levels to make sure that the anesthetics do not cause any problems. The procedure may vary among health care providers and hospitals. What happens after the procedure?  Your blood pressure, temperature, heart rate, breathing rate, and blood oxygen level will be monitored until the medicines you were given have worn off.  You will wake up in a recovery area. You may wake up slowly.  If you feel anxious or agitated, you may be given medicine to help you calm down.  If you will be going home the same day, your health care provider may check to make sure you can walk, drink, and urinate.  Your health care provider will treat any pain or side effects you have before you go home.  Do not drive for 24 hours if you were given a sedative. Summary  General anesthesia is used to keep you still and prevent pain during a procedure.  It is important to tell your health care provider about your medical history and any surgeries you have had, and previous experience with anesthesia.  Follow your health care provider's instructions about when to stop eating, drinking, or taking certain medicines before your procedure.  Plan to have someone take you home from the hospital or clinic. This information is not intended to replace advice given to you by your health care provider. Make sure you discuss any questions you have with your health care provider. Document Released: 03/15/2008 Document Revised: 04/26/2018 Document Reviewed: 07/23/2017 Elsevier Interactive Patient Education  2019 [place] Anesthesia, Adult, Care After This sheet gives you information about how to care for yourself after your procedure. Your health care provider may also give you more specific instructions. If you have problems or questions, contact your health care provider. What can I expect after the procedure? After the procedure, the following side effects are  common:  Pain or discomfort at the IV site.  Nausea.  Vomiting.  Sore throat.  Trouble concentrating.  Feeling cold or chills.  Weak or tired.  Sleepiness and fatigue.  Soreness and body aches. These side effects can affect parts of the body that were not involved in surgery. Follow these instructions at home:  For at least 24 hours after the procedure:  Have a responsible adult stay with you. It is important to have someone help care for you until you are awake and alert.  Rest as needed.  Do not: ? Participate in activities in which you could fall or become injured. ? Drive. ? Use heavy machinery. ? Drink alcohol. ? Take sleeping pills or medicines that cause drowsiness. ? Make important decisions or sign legal documents. ? Take care of children on your own. Eating and drinking  Follow any instructions from your health care provider about eating or drinking restrictions.  When you feel hungry, start by eating small amounts of foods that are soft and easy to digest (bland), such as toast. Gradually return to your regular diet.  Drink enough fluid to keep your urine pale yellow.  If you vomit, rehydrate by drinking water, juice, or clear broth. General instructions  If you have sleep apnea, surgery and certain medicines can increase your risk for breathing problems. Follow  instructions from your health care provider about wearing your sleep device: ? Anytime you are sleeping, including during daytime naps. ? While taking prescription pain medicines, sleeping medicines, or medicines that make you drowsy.  Return to your normal activities as told by your health care provider. Ask your health care provider what activities are safe for you.  Take over-the-counter and prescription medicines only as told by your health care provider.  If you smoke, do not smoke without supervision.  Keep all follow-up visits as told by your health care provider. This is  important. Contact a health care provider if:  You have nausea or vomiting that does not get better with medicine.  You cannot eat or drink without vomiting.  You have pain that does not get better with medicine.  You are unable to pass urine.  You develop a skin rash.  You have a fever.  You have redness around your IV site that gets worse. Get help right away if:  You have difficulty breathing.  You have chest pain.  You have blood in your urine or stool, or you vomit blood. Summary  After the procedure, it is common to have a sore throat or nausea. It is also common to feel tired.  Have a responsible adult stay with you for the first 24 hours after general anesthesia. It is important to have someone help care for you until you are awake and alert.  When you feel hungry, start by eating small amounts of foods that are soft and easy to digest (bland), such as toast. Gradually return to your regular diet.  Drink enough fluid to keep your urine pale yellow.  Return to your normal activities as told by your health care provider. Ask your health care provider what activities are safe for you. This information is not intended to replace advice given to you by your health care provider. Make sure you discuss any questions you have with your health care provider. Document Released: 03/15/2001 Document Revised: 07/23/2017 Document Reviewed: 07/23/2017 Elsevier Interactive Patient Education  2019 [employer].

## 2018-12-29 ENCOUNTER — Telehealth: Payer: Self-pay | Admitting: General Surgery

## 2018-12-29 NOTE — Telephone Encounter (Addendum)
[facility]  Patient surgery scheduled for 1/20.  Called to discuss possibility of needing blood as I do not recall this discussion with the patient during her visit.   Discussed her surgery. Answered her questions. Consented her for blood.   Curlene Labrum, MD [facility] [address] [place], [place] [address] [phone] (office)

## 2019-01-02 NOTE — H&P (Signed)
Attempted to order the gabapentin preop for ERAS. The system would not let me due to federal regulation warning. Will need to order the day or surgery.  Gabapentin 300 mg once day of surgery.   Curlene Labrum, MD [facility] [address] [place], [place] [address] [phone] (office)

## 2019-01-02 NOTE — H&P (Signed)
[facility] History and Physical  Reason for Referral: Colon Cancer  Referring Physician:  Dr. Laural Golden      Chief Complaint    Colon Cancer      Andrea Simon is a 63 y.o. female.  HPI: Andrea Simon is a 63 yo who is in good health and follows with Dr. Moshe Cipro. She had went for her normal annual exam and was screened for colon cancer with the cologuard test. This result was positive and she was referred to Dr. Laural Golden. She reported no melena or BRBPR and no unintentional weight loss. She had otherwise been in her normal state of health leading up to this diagnosis. She reports regular BMs and has no family history of colon cancer.    Dr. Laural Golden performed a colonoscopy and noted a mass in what he thought was the splenic flexure. This was biopsied and came back poorly differentiated adenocarcinoma. She underwent a staging CT scan and this demonstrates the lesion more in the transverse colon area and possible enlarged nodes.   Colonoscopy: -Perianal skin tags found on perianal exam. - Malignant tumor at the splenic flexure. Biopsied. - One small polyp in the proximal sigmoid colon, removed with a cold snare. Resected and retrieved. - External hemorrhoids. - Anal papilla(e) were hypertrophied.      Past Medical History:  Diagnosis Date  . Hyperlipidemia   . Multinodular goiter   . Prediabetes          Past Surgical History:  Procedure Laterality Date  . BIOPSY  12/07/2018   Procedure: BIOPSY;  Surgeon: Rogene Houston, MD;  Location: [facility];  Service: Endoscopy;;  sigmoid colon  . COLONOSCOPY N/A 12/07/2018   Procedure: COLONOSCOPY;  Surgeon: Rogene Houston, MD;  Location: [facility];  Service: Endoscopy;  Laterality: N/A;  12:45  . DILATION AND CURETTAGE OF UTERUS  2006  . FNA thyroid  2011   benign  . POLYPECTOMY  12/07/2018   Procedure: POLYPECTOMY;  Surgeon: Rogene Houston, MD;  Location: [facility];  Service:  Endoscopy;;  splenic flexure (CS x1)  . TUBAL LIGATION  1992         Family History  Problem Relation Age of Onset  . Hypertension Mother        AAA  . Coronary artery disease Mother   . Hyperlipidemia Mother   . Cancer Mother        lung  . Hypertension Father     Social History        Tobacco Use  . Smoking status: Current Every Day Smoker    Packs/day: 0.75    Types: Cigarettes  . Smokeless tobacco: Never Used  Substance Use Topics  . Alcohol use: Yes    Alcohol/week: 0.0 standard drinks    Comment: occasionally  . Drug use: No    Comment: Ocassionally     Medications: I have reviewed the patient's current medications. Allergies as of 12/22/2018   No Known Allergies        Medication List       Accurate as of December 22, 2018 11:59 PM. Always use your most recent med list.        acetaminophen 325 MG tablet Commonly known as:  TYLENOL Take 650 mg by mouth daily as needed for moderate pain or headache.   aspirin EC 81 MG tablet Take 1 tablet (81 mg total) by mouth every evening.   CALCIUM 600+D 600-400 MG-UNIT tablet Generic drug:  Calcium Carbonate-Vitamin D Take 1 tablet by mouth 2 (two) times a week. Sunday and Wednesday evenings   metFORMIN 500 MG tablet Commonly known as:  GLUCOPHAGE TAKE 1 TABLET BY MOUTH TWO  TIMES DAILY WITH A MEAL   metroNIDAZOLE 500 MG tablet Commonly known as:  FLAGYL Take 2 tablets (1,000 mg total) by mouth as directed. Take 2 metronidazole 500mg  tablets at 2 pm, at 3pm, at 10pm.   naproxen sodium 220 MG tablet Commonly known as:  ALEVE Take 220 mg by mouth daily as needed (pain).   neomycin 500 MG tablet Commonly known as:  MYCIFRADIN Take 2 tablets (1,000 mg total) by mouth as directed. Take 2 neomycin 500mg  tablets at 2 pm, at 3pm, at 10pm.   pravastatin 40 MG tablet Commonly known as:  PRAVACHOL TAKE 1 TABLET BY MOUTH  DAILY IN THE EVENING        ROS:  A  comprehensive review of systems was negative except for: Eyes: positive for floaters in field of vision Musculoskeletal: positive for back pain  Blood pressure (!) 147/82, pulse 84, temperature 97.7 F (36.5 C), temperature source Temporal, resp. rate 16, weight 133 lb 12.8 oz (60.7 kg). Physical Exam Vitals signs reviewed.  Constitutional:      Appearance: Normal appearance.  HENT:     Head: Normocephalic and atraumatic.     Nose: Nose normal.     Mouth/Throat:     Mouth: Mucous membranes are moist.  Eyes:     Extraocular Movements: Extraocular movements intact.     Pupils: Pupils are equal, round, and reactive to light.  Neck:     Musculoskeletal: Normal range of motion.  Cardiovascular:     Rate and Rhythm: Normal rate and regular rhythm.     Heart sounds: Normal heart sounds.  Pulmonary:     Effort: Pulmonary effort is normal.     Breath sounds: Normal breath sounds.  Abdominal:     General: Abdomen is flat. There is no distension.     Palpations: Abdomen is soft. There is no mass.     Tenderness: There is no abdominal tenderness.     Hernia: No hernia is present.  Musculoskeletal: Normal range of motion.        General: No swelling.  Skin:    General: Skin is warm and dry.  Neurological:     General: No focal deficit present.     Mental Status: She is alert and oriented to person, place, and time.  Psychiatric:        Mood and Affect: Mood normal.        Behavior: Behavior normal.        Thought Content: Thought content normal.        Judgment: Judgment normal.    Personally reviewed- redundant transverse colon, area of mass in the proximal transverse on CT scan, some nodes possible but no liver mets  Results: CT a/p 11/2018 IMPRESSION: 1. 4.2 x 2.9 cm near circumferential apple-core type mass involving the proximal transverse colon. A few small lymph nodes are noted in the pericolonic fat but no retroperitoneal adenopathy or evidence of hepatic metastatic  disease. 2. Markedly enlarged fibroid uterus. 3. Single gallstone in the gallbladder without definite findings for acute cholecystitis but there is fluid or edema in the adjacent liver. Recommend correlation with right upper quadrant ultrasound examination.  Assessment & Plan:  Andrea Simon is a 63 y.o. female with invasive adenocarcinoma of the transverse colon.  She has  undergone a CT a/p that demonstrates this lesion and possible small nodes in the area.  We discussed this cancer and the needed for surgery. We discussed the possibility of laparoscopic / laparoscopic hand assisted surgery and that she will likely end up with an extended right hemicolectomy due to the location in the proximal transverse colon. We discussed that the risk of surgery including but not limited to bleeding, infection, leak, injury to other organs, injury to the ureter, unlikely need for an ostomy given the right sided cancer, possible needing to open, and potential need for chemotherapy based on the final pathology and Stage.   -OR in the upcoming weeks for laparoscopic hand assisted partial colectomy -Patient wants to discuss with her family and look at her schedule   -Bowel preparation instructions given and Rx written  -Once patient has decided on a date, I will call her back and re-discuss surgery and consent her for blood given the type of surgery   All questions were answered to the satisfaction of the patient.  Colon Preparation:  Buy from the Store: Miralax bottle [phone]).  Gatorade 64 oz (not red). Dulcolax tablets.   The Day Prior to Surgery: Take 4 ducolax tablets at 7am with water. Drink plenty of clear liquids all day to avoid dehydration, no solid food.   Mix thebottle of Miralax and 64 oz of Gatorade and drink this mixture starting at 10am. Drinkit gradually over the next few hours,8 ounces every 15-30 minutes until it is gone. Finish this by 2pm.  Take 2 neomycin 500mg  tablets  and 2 metronidazole 500mg  tablets at 2 pm. Take 2 neomycin 500mg  tablets and 2 metronidazole 500mg  tablets at 3pm. Take 2 neomycin 500mg  tablets and 2 metronidazole 500mg  tablets at 10pm.   Do not eat or drink anything after midnight the night before your surgery.  Do not eat or drink anything that morning, and take medications as instructed by the hospital staff on your preoperative visit.   Virl Cagey 12/23/2018, 3:34 PM    Called and consented patient for blood. Answered her questions about surgery.  Curlene Labrum, MD [facility] [address] [place], [place] [address] [phone] (office) 01/02/2019, 2:25PM

## 2019-01-03 ENCOUNTER — Encounter: Payer: Self-pay | Admitting: Family Medicine

## 2019-01-03 ENCOUNTER — Ambulatory Visit (INDEPENDENT_AMBULATORY_CARE_PROVIDER_SITE_OTHER): Payer: 59 | Admitting: Family Medicine

## 2019-01-03 VITALS — BP 156/70 | HR 69 | Resp 12 | Ht 64.0 in | Wt 133.0 lb

## 2019-01-03 DIAGNOSIS — E1169 Type 2 diabetes mellitus with other specified complication: Secondary | ICD-10-CM | POA: Diagnosis not present

## 2019-01-03 DIAGNOSIS — F17200 Nicotine dependence, unspecified, uncomplicated: Secondary | ICD-10-CM

## 2019-01-03 DIAGNOSIS — E782 Mixed hyperlipidemia: Secondary | ICD-10-CM

## 2019-01-03 DIAGNOSIS — C184 Malignant neoplasm of transverse colon: Secondary | ICD-10-CM

## 2019-01-03 NOTE — Patient Instructions (Addendum)
F/U in 4.5 months, call if you need me sooner    Fasting l lipid, cmp and EGFr, hBA1C  And CBC 1 week before f/u  Microalb from office today  Please conitnue to work on Donna the very best with your upcoming Surgery, we are here for you in any way that we can help

## 2019-01-04 ENCOUNTER — Other Ambulatory Visit: Payer: Self-pay

## 2019-01-04 ENCOUNTER — Other Ambulatory Visit (HOSPITAL_COMMUNITY)
Admission: RE | Admit: 2019-01-04 | Discharge: 2019-01-04 | Disposition: A | Discharge status: Home / Self Care | Payer: 59 | Source: Ambulatory Visit | Attending: *Deleted | Admitting: *Deleted

## 2019-01-04 ENCOUNTER — Other Ambulatory Visit (HOSPITAL_COMMUNITY): Payer: 59

## 2019-01-04 ENCOUNTER — Encounter (HOSPITAL_COMMUNITY)
Admission: RE | Admit: 2019-01-04 | Discharge: 2019-01-04 | Disposition: A | Discharge status: Home / Self Care | Payer: 59 | Source: Ambulatory Visit | Attending: General Surgery | Admitting: General Surgery

## 2019-01-04 ENCOUNTER — Encounter (HOSPITAL_COMMUNITY): Payer: Self-pay

## 2019-01-04 DIAGNOSIS — Z01818 Encounter for other preprocedural examination: Secondary | ICD-10-CM | POA: Insufficient documentation

## 2019-01-04 LAB — COMPREHENSIVE METABOLIC PANEL
ALK PHOS: 62 U/L (ref 38–126)
ALT: 16 U/L (ref 0–44)
AST: 22 U/L (ref 15–41)
Albumin: 4.6 g/dL (ref 3.5–5.0)
Anion gap: 10 (ref 5–15)
BUN: 8 mg/dL (ref 8–23)
CO2: 25 mmol/L (ref 22–32)
Calcium: 9.7 mg/dL (ref 8.9–10.3)
Chloride: 103 mmol/L (ref 98–111)
Creatinine, Ser: 0.66 mg/dL (ref 0.44–1.00)
GFR calc Af Amer: >60 mL/min (ref >60)
GFR calc non Af Amer: >60 mL/min (ref >60)
Glucose, Bld: 129 mg/dL — ABNORMAL HIGH (ref 70–99)
Potassium: 3.3 mmol/L — ABNORMAL LOW (ref 3.5–5.1)
SODIUM: 138 mmol/L (ref 135–145)
Total Bilirubin: 1.1 mg/dL (ref 0.3–1.2)
Total Protein: 7.7 g/dL (ref 6.5–8.1)

## 2019-01-04 LAB — CBC WITH DIFFERENTIAL/PLATELET
Abs Immature Granulocytes: 0.02 10*3/uL (ref 0.00–0.07)
Basophils Absolute: 0 10*3/uL (ref 0.0–0.1)
Basophils Relative: 1 %
Eosinophils Absolute: 0.1 10*3/uL (ref 0.0–0.5)
Eosinophils Relative: 1 %
HCT: 37.6 % (ref 36.0–46.0)
Hemoglobin: 12.5 g/dL (ref 12.0–15.0)
Immature Granulocytes: 0 %
Lymphocytes Relative: 42 %
Lymphs Abs: 2.6 10*3/uL (ref 0.7–4.0)
MCH: 35.6 pg — ABNORMAL HIGH (ref 26.0–34.0)
MCHC: 33.2 g/dL (ref 30.0–36.0)
MCV: 107.1 fL — ABNORMAL HIGH (ref 80.0–100.0)
MONO ABS: 0.2 10*3/uL (ref 0.1–1.0)
Monocytes Relative: 4 %
NEUTROS ABS: 3.3 10*3/uL (ref 1.7–7.7)
Neutrophils Relative %: 52 %
PLATELETS: 211 10*3/uL (ref 150–400)
RBC: 3.51 MIL/uL — ABNORMAL LOW (ref 3.87–5.11)
RDW: 13.4 % (ref 11.5–15.5)
WBC: 6.2 10*3/uL (ref 4.0–10.5)
nRBC: 0 % (ref 0.0–0.2)

## 2019-01-04 LAB — HEMOGLOBIN A1C
Hgb A1c MFr Bld: 5.8 % — ABNORMAL HIGH (ref 4.8–5.6)
Mean Plasma Glucose: 119.76 mg/dL

## 2019-01-04 LAB — TYPE AND SCREEN
ABO/RH(D): O POS
Antibody Screen: NEGATIVE

## 2019-01-04 LAB — PREPARE RBC (CROSSMATCH)

## 2019-01-04 LAB — ABO/RH: ABO/RH(D): O POS

## 2019-01-04 LAB — GLUCOSE, CAPILLARY: Glucose-Capillary: 127 mg/dL — ABNORMAL HIGH (ref 70–99)

## 2019-01-05 ENCOUNTER — Encounter: Payer: Self-pay | Admitting: Family Medicine

## 2019-01-05 DIAGNOSIS — E119 Type 2 diabetes mellitus without complications: Secondary | ICD-10-CM | POA: Insufficient documentation

## 2019-01-05 LAB — MICROALBUMIN / CREATININE URINE RATIO
Creatinine, Urine: 42.3 mg/dL
Microalb Creat Ratio: 16.3 mg/g creat (ref 0.0–30.0)
Microalb, Ur: 6.9 ug/mL — ABNORMAL HIGH

## 2019-01-05 NOTE — Progress Notes (Signed)
NIKITHA MODE     MRN: [id number]      DOB: [birth year]   HPI Ms. Paske is here for follow up and re-evaluation of chronic medical conditions, medication management and review of any available recent lab and radiology data.  Preventive health is updated, specifically  Cancer screening and Immunization.   Questions or concerns regarding consultations or procedures which the PT has had in the interim are  addressed. The PT denies any adverse reactions to current medications since the last visit.  There are no new concerns.  There are no specific complaints   ROS Denies recent fever or chills. Denies sinus pressure, nasal congestion, ear pain or sore throat. Denies chest congestion, productive cough or wheezing. Denies chest pains, palpitations and leg swelling Denies abdominal pain, nausea, vomiting,diarrhea or constipation.   Denies dysuria, frequency, hesitancy or incontinence. Denies joint pain, swelling and limitation in mobility. Denies headaches, seizures, numbness, or tingling. Denies depression,uncontrolled  anxiety or insomnia. Denies skin break down or rash.   PE  BP (!) 156/70   Pulse 69   Resp 12   Ht 5\' 4"  (1.626 m)   Wt 133 lb (60.3 kg)   SpO2 98% Comment: room air  BMI 22.83 kg/m   Patient alert and oriented and in no cardiopulmonary distress.  HEENT: No facial asymmetry, EOMI,   oropharynx pink and moist.  Neck supple no JVD, no mass.  Chest: Clear to auscultation bilaterally.  CVS: S1, S2 no murmurs, no S3.Regular rate.  ABD: Soft non tender.   Ext: No edema  MS: Adequate ROM spine, shoulders, hips and knees.  Skin: Intact, no ulcerations or rash noted.  Psych: Good eye contact, normal affect. Memory intact mildly anxious and  depressed appearing.  CNS: CN 2-12 intact, power,  normal throughout.no focal deficits noted.   Assessment & Plan  Diabetes mellitus ([place]) Ms. Calabretta is reminded of the importance of commitment to daily  physical activity for 30 minutes or more, as able and the need to limit carbohydrate intake to 30 to 60 grams per meal to help with blood sugar control.   Markedly improved blood sugar control, may d/c metformin.   Ms. Nave is reminded of the importance of daily foot exam, annual eye examination, and good blood sugar, blood pressure and cholesterol control.  Diabetic Labs Latest Ref Rng & Units 01/04/2019 12/16/2018 08/18/2018 09/02/2017 08/31/2017  HbA1c 4.8 - 5.6 % 5.8(H) - CANCELED - 6.3(H)  Microalbumin mg/dL - - - 1.1 -  Micro/Creat Ratio <30 mcg/mg creat - - - 20 -  Chol 100 - 199 mg/dL - - 189 - 203(H)  HDL >39 mg/dL - - 32(L) - 35(L)  Calc LDL 0 - 99 mg/dL - - 112(H) - 125(H)  Triglycerides 0 - 149 mg/dL - - 224(H) - 213(H)  Creatinine 0.44 - 1.00 mg/dL 0.66 0.80 0.75 - 0.76   BP/Weight 01/04/2019 01/03/2019 12/22/2018 12/07/2018 11/23/2018 10/31/2018 7/82/4235  Systolic BP 361 443 154 98 008 676 195  Diastolic BP 84 70 82 56 78 80 76  Wt. (Lbs) 133 133 133.8 130 - 133.3 140.04  BMI 22.83 22.83 23.7 23.03 - 23.61 24.81   Foot/eye exam completion dates Latest Ref Rng & Units 11/08/2018 08/18/2016  Eye Exam No Retinopathy No Retinopathy -  Foot Form Completion - - Done        NICOTINE ADDICTION Asked:confirms currently smokes cigarettes Assess: Unwilling to quit currently stressed, new dx of colon cancer and recent loss of  Aunt to lung cancer Advise: needs to QUIT to reduce risk of cancer, cardio and cerebrovascular disease Assist: counseled for 5 minutes and literature provided Arrange: follow up in 3 months   Hyperlipemia Hyperlipidemia:Low fat diet discussed and encouraged.   Lipid Panel  Lab Results  Component Value Date   CHOL 189 08/18/2018   HDL 32 (L) 08/18/2018   LDLCALC 112 (H) 08/18/2018   TRIG 224 (H) 08/18/2018   CHOLHDL 5.9 (H) 08/18/2018   Updated lab for next visit    Malignant neoplasm of transverse colon [facility]) Planned surgery in next week,  mentally prepared currently

## 2019-01-05 NOTE — Assessment & Plan Note (Signed)
Asked:confirms currently smokes cigarettes Assess: Unwilling to quit currently stressed, new dx of colon cancer and recent loss of Aunt to lung cancer Advise: needs to QUIT to reduce risk of cancer, cardio and cerebrovascular disease Assist: counseled for 5 minutes and literature provided Arrange: follow up in 3 months

## 2019-01-05 NOTE — Assessment & Plan Note (Signed)
Planned surgery in next week, mentally prepared currently

## 2019-01-05 NOTE — Assessment & Plan Note (Signed)
Hyperlipidemia:Low fat diet discussed and encouraged.   Lipid Panel  Lab Results  Component Value Date   CHOL 189 08/18/2018   HDL 32 (L) 08/18/2018   LDLCALC 112 (H) 08/18/2018   TRIG 224 (H) 08/18/2018   CHOLHDL 5.9 (H) 08/18/2018   Updated lab for next visit

## 2019-01-05 NOTE — Pre-Procedure Instructions (Signed)
HgbA1C routed to PCP. 

## 2019-01-05 NOTE — Assessment & Plan Note (Signed)
Andrea Simon is reminded of the importance of commitment to daily physical activity for 30 minutes or more, as able and the need to limit carbohydrate intake to 30 to 60 grams per meal to help with blood sugar control.   Markedly improved blood sugar control, may d/c metformin.   Andrea Simon is reminded of the importance of daily foot exam, annual eye examination, and good blood sugar, blood pressure and cholesterol control.  Diabetic Labs Latest Ref Rng & Units 01/04/2019 12/16/2018 08/18/2018 09/02/2017 08/31/2017  HbA1c 4.8 - 5.6 % 5.8(H) - CANCELED - 6.3(H)  Microalbumin mg/dL - - - 1.1 -  Micro/Creat Ratio <30 mcg/mg creat - - - 20 -  Chol 100 - 199 mg/dL - - 189 - 203(H)  HDL >39 mg/dL - - 32(L) - 35(L)  Calc LDL 0 - 99 mg/dL - - 112(H) - 125(H)  Triglycerides 0 - 149 mg/dL - - 224(H) - 213(H)  Creatinine 0.44 - 1.00 mg/dL 0.66 0.80 0.75 - 0.76   BP/Weight 01/04/2019 01/03/2019 12/22/2018 12/07/2018 11/23/2018 10/31/2018 05/08/8415  Systolic BP 606 301 601 98 093 235 573  Diastolic BP 84 70 82 56 78 80 76  Wt. (Lbs) 133 133 133.8 130 - 133.3 140.04  BMI 22.83 22.83 23.7 23.03 - 23.61 24.81   Foot/eye exam completion dates Latest Ref Rng & Units 11/08/2018 08/18/2016  Eye Exam No Retinopathy No Retinopathy -  Foot Form Completion - - Done

## 2019-01-09 ENCOUNTER — Inpatient Hospital Stay (HOSPITAL_COMMUNITY)
Admission: RE | Admit: 2019-01-09 | Discharge: 2019-01-15 | DRG: 330 | Disposition: A | Discharge status: Home / Self Care | Payer: 59 | Attending: General Surgery | Admitting: General Surgery

## 2019-01-09 ENCOUNTER — Encounter (HOSPITAL_COMMUNITY): Payer: Self-pay | Admitting: *Deleted

## 2019-01-09 ENCOUNTER — Inpatient Hospital Stay (HOSPITAL_COMMUNITY): Payer: 59 | Admitting: Anesthesiology

## 2019-01-09 ENCOUNTER — Encounter (HOSPITAL_COMMUNITY)
Admission: RE | Disposition: A | Discharge status: Home / Self Care | Payer: Self-pay | Source: Home / Self Care | Attending: General Surgery

## 2019-01-09 ENCOUNTER — Other Ambulatory Visit: Payer: Self-pay

## 2019-01-09 DIAGNOSIS — K567 Ileus, unspecified: Secondary | ICD-10-CM | POA: Diagnosis not present

## 2019-01-09 DIAGNOSIS — C772 Secondary and unspecified malignant neoplasm of intra-abdominal lymph nodes: Secondary | ICD-10-CM | POA: Diagnosis present

## 2019-01-09 DIAGNOSIS — C786 Secondary malignant neoplasm of retroperitoneum and peritoneum: Secondary | ICD-10-CM | POA: Diagnosis present

## 2019-01-09 DIAGNOSIS — C184 Malignant neoplasm of transverse colon: Principal | ICD-10-CM | POA: Diagnosis present

## 2019-01-09 DIAGNOSIS — F17210 Nicotine dependence, cigarettes, uncomplicated: Secondary | ICD-10-CM | POA: Diagnosis present

## 2019-01-09 DIAGNOSIS — Q438 Other specified congenital malformations of intestine: Secondary | ICD-10-CM | POA: Diagnosis not present

## 2019-01-09 DIAGNOSIS — E042 Nontoxic multinodular goiter: Secondary | ICD-10-CM | POA: Diagnosis present

## 2019-01-09 DIAGNOSIS — E785 Hyperlipidemia, unspecified: Secondary | ICD-10-CM | POA: Diagnosis present

## 2019-01-09 DIAGNOSIS — C189 Malignant neoplasm of colon, unspecified: Secondary | ICD-10-CM | POA: Diagnosis present

## 2019-01-09 DIAGNOSIS — K644 Residual hemorrhoidal skin tags: Secondary | ICD-10-CM | POA: Diagnosis present

## 2019-01-09 DIAGNOSIS — Z7984 Long term (current) use of oral hypoglycemic drugs: Secondary | ICD-10-CM | POA: Diagnosis not present

## 2019-01-09 DIAGNOSIS — Z79899 Other long term (current) drug therapy: Secondary | ICD-10-CM

## 2019-01-09 DIAGNOSIS — Z7982 Long term (current) use of aspirin: Secondary | ICD-10-CM

## 2019-01-09 DIAGNOSIS — R7303 Prediabetes: Secondary | ICD-10-CM | POA: Diagnosis present

## 2019-01-09 DIAGNOSIS — R112 Nausea with vomiting, unspecified: Secondary | ICD-10-CM

## 2019-01-09 DIAGNOSIS — Z4659 Encounter for fitting and adjustment of other gastrointestinal appliance and device: Secondary | ICD-10-CM

## 2019-01-09 DIAGNOSIS — Z8349 Family history of other endocrine, nutritional and metabolic diseases: Secondary | ICD-10-CM

## 2019-01-09 HISTORY — PX: COLON RESECTION: SHX5231

## 2019-01-09 LAB — GLUCOSE, CAPILLARY
Glucose-Capillary: 86 mg/dL (ref 70–99)
Glucose-Capillary: 129 mg/dL — ABNORMAL HIGH (ref 70–99)
Glucose-Capillary: 80 mg/dL (ref 70–99)

## 2019-01-09 SURGERY — COLECTOMY, HAND-ASSISTED, LAPAROSCOPIC
Anesthesia: General

## 2019-01-09 MED ORDER — SUGAMMADEX SODIUM 500 MG/5ML IV SOLN
INTRAVENOUS | Status: DC | PRN
  Administered 2019-01-09: 200 mg via INTRAVENOUS

## 2019-01-09 MED ORDER — KETOROLAC TROMETHAMINE 30 MG/ML IJ SOLN
30.0000 mg | Freq: Once | INTRAMUSCULAR | Status: AC | PRN
  Administered 2019-01-09: 30 mg via INTRAVENOUS
  Filled 2019-01-09: qty 1

## 2019-01-09 MED ORDER — ALVIMOPAN 12 MG PO CAPS
12.0000 mg | ORAL_CAPSULE | ORAL | Status: AC
  Administered 2019-01-09: 12 mg via ORAL
  Filled 2019-01-09: qty 1

## 2019-01-09 MED ORDER — CELECOXIB 100 MG PO CAPS
200.0000 mg | ORAL_CAPSULE | ORAL | Status: AC
  Administered 2019-01-09: 200 mg via ORAL
  Filled 2019-01-09: qty 2

## 2019-01-09 MED ORDER — ONDANSETRON HCL 4 MG/2ML IJ SOLN
INTRAMUSCULAR | Status: DC | PRN
  Administered 2019-01-09: 4 mg via INTRAVENOUS

## 2019-01-09 MED ORDER — PROPOFOL 10 MG/ML IV BOLUS
INTRAVENOUS | Status: AC
  Filled 2019-01-09: qty 20

## 2019-01-09 MED ORDER — DOCUSATE SODIUM 100 MG PO CAPS
100.0000 mg | ORAL_CAPSULE | Freq: Two times a day (BID) | ORAL | Status: DC
  Administered 2019-01-09 – 2019-01-10 (×4): 100 mg via ORAL
  Filled 2019-01-09 (×7): qty 1

## 2019-01-09 MED ORDER — SODIUM CHLORIDE 0.9 % IV SOLN
2.0000 g | INTRAVENOUS | Status: AC
  Administered 2019-01-09: 2 g via INTRAVENOUS
  Filled 2019-01-09: qty 2

## 2019-01-09 MED ORDER — INSULIN ASPART 100 UNIT/ML ~~LOC~~ SOLN
0.0000 [IU] | Freq: Three times a day (TID) | SUBCUTANEOUS | Status: DC
  Administered 2019-01-10: 3 [IU] via SUBCUTANEOUS

## 2019-01-09 MED ORDER — MEPERIDINE HCL 50 MG/ML IJ SOLN: 6.2500 mg | INTRAMUSCULAR | Status: DC | PRN

## 2019-01-09 MED ORDER — BUPIVACAINE LIPOSOME 1.3 % IJ SUSP
INTRAMUSCULAR | Status: DC | PRN
  Administered 2019-01-09: 20 mL

## 2019-01-09 MED ORDER — DIPHENHYDRAMINE HCL 12.5 MG/5ML PO ELIX: 12.5000 mg | ORAL_SOLUTION | Freq: Four times a day (QID) | ORAL | Status: DC | PRN

## 2019-01-09 MED ORDER — PROPOFOL 10 MG/ML IV BOLUS
INTRAVENOUS | Status: DC | PRN
  Administered 2019-01-09: 150 mg via INTRAVENOUS
  Administered 2019-01-09: 10 mg via INTRAVENOUS
  Administered 2019-01-09: 40 mg via INTRAVENOUS

## 2019-01-09 MED ORDER — PANTOPRAZOLE SODIUM 40 MG PO TBEC
40.0000 mg | DELAYED_RELEASE_TABLET | Freq: Every day | ORAL | Status: DC
  Administered 2019-01-09 – 2019-01-10 (×2): 40 mg via ORAL
  Filled 2019-01-09 (×4): qty 1

## 2019-01-09 MED ORDER — METHOCARBAMOL 500 MG PO TABS: 500.0000 mg | ORAL_TABLET | Freq: Four times a day (QID) | ORAL | Status: DC | PRN

## 2019-01-09 MED ORDER — SIMETHICONE 80 MG PO CHEW
40.0000 mg | CHEWABLE_TABLET | Freq: Four times a day (QID) | ORAL | Status: DC | PRN
  Administered 2019-01-10: 40 mg via ORAL
  Filled 2019-01-09 (×2): qty 1

## 2019-01-09 MED ORDER — SUCCINYLCHOLINE CHLORIDE 200 MG/10ML IV SOSY
PREFILLED_SYRINGE | INTRAVENOUS | Status: AC
  Filled 2019-01-09: qty 10

## 2019-01-09 MED ORDER — HEPARIN SODIUM (PORCINE) 5000 UNIT/ML IJ SOLN
5000.0000 [IU] | Freq: Once | INTRAMUSCULAR | Status: AC
  Administered 2019-01-09: 5000 [IU] via SUBCUTANEOUS
  Filled 2019-01-09: qty 1

## 2019-01-09 MED ORDER — ROCURONIUM BROMIDE 100 MG/10ML IV SOLN
INTRAVENOUS | Status: DC | PRN
  Administered 2019-01-09: 70 mg via INTRAVENOUS

## 2019-01-09 MED ORDER — LIDOCAINE 2% (20 MG/ML) 5 ML SYRINGE
INTRAMUSCULAR | Status: AC
  Filled 2019-01-09: qty 5

## 2019-01-09 MED ORDER — ONDANSETRON HCL 4 MG/2ML IJ SOLN
INTRAMUSCULAR | Status: AC
  Filled 2019-01-09: qty 2

## 2019-01-09 MED ORDER — BUPIVACAINE LIPOSOME 1.3 % IJ SUSP
20.0000 mL | Freq: Once | INTRAMUSCULAR | Status: DC
  Filled 2019-01-09: qty 20

## 2019-01-09 MED ORDER — PHENYLEPHRINE HCL 10 MG/ML IJ SOLN
INTRAMUSCULAR | Status: DC | PRN
  Administered 2019-01-09 (×3): 40 ug via INTRAVENOUS

## 2019-01-09 MED ORDER — HYDROCODONE-ACETAMINOPHEN 7.5-325 MG PO TABS: 1.0000 | ORAL_TABLET | Freq: Once | ORAL | Status: DC | PRN

## 2019-01-09 MED ORDER — FENTANYL CITRATE (PF) 250 MCG/5ML IJ SOLN
INTRAMUSCULAR | Status: AC
  Filled 2019-01-09: qty 5

## 2019-01-09 MED ORDER — LACTATED RINGERS IV SOLN
INTRAVENOUS | Status: DC
  Administered 2019-01-09 – 2019-01-13 (×8): via INTRAVENOUS

## 2019-01-09 MED ORDER — LIDOCAINE HCL (CARDIAC) PF 50 MG/5ML IV SOSY
PREFILLED_SYRINGE | INTRAVENOUS | Status: DC | PRN
  Administered 2019-01-09: 50 mg via INTRAVENOUS

## 2019-01-09 MED ORDER — ONDANSETRON HCL 4 MG/2ML IJ SOLN: 4.0000 mg | Freq: Once | INTRAMUSCULAR | Status: DC | PRN

## 2019-01-09 MED ORDER — METOPROLOL TARTRATE 5 MG/5ML IV SOLN: 5.0000 mg | Freq: Four times a day (QID) | INTRAVENOUS | Status: DC | PRN

## 2019-01-09 MED ORDER — CHLORHEXIDINE GLUCONATE CLOTH 2 % EX PADS: 6.0000 | MEDICATED_PAD | Freq: Once | CUTANEOUS | Status: DC

## 2019-01-09 MED ORDER — ROCURONIUM BROMIDE 10 MG/ML (PF) SYRINGE
PREFILLED_SYRINGE | INTRAVENOUS | Status: AC
  Filled 2019-01-09: qty 20

## 2019-01-09 MED ORDER — PHENYLEPHRINE 40 MCG/ML (10ML) SYRINGE FOR IV PUSH (FOR BLOOD PRESSURE SUPPORT)
PREFILLED_SYRINGE | INTRAVENOUS | Status: AC
  Filled 2019-01-09: qty 10

## 2019-01-09 MED ORDER — ALVIMOPAN 12 MG PO CAPS
12.0000 mg | ORAL_CAPSULE | Freq: Two times a day (BID) | ORAL | Status: DC
  Administered 2019-01-10 (×2): 12 mg via ORAL
  Filled 2019-01-09 (×3): qty 1

## 2019-01-09 MED ORDER — 0.9 % SODIUM CHLORIDE (POUR BTL) OPTIME
TOPICAL | Status: DC | PRN
  Administered 2019-01-09 (×3): 1000 mL

## 2019-01-09 MED ORDER — ZOLPIDEM TARTRATE 5 MG PO TABS: 5.0000 mg | ORAL_TABLET | Freq: Every evening | ORAL | Status: DC | PRN

## 2019-01-09 MED ORDER — MORPHINE SULFATE (PF) 2 MG/ML IV SOLN: 2.0000 mg | INTRAVENOUS | Status: DC | PRN

## 2019-01-09 MED ORDER — GABAPENTIN 300 MG PO CAPS
300.0000 mg | ORAL_CAPSULE | Freq: Two times a day (BID) | ORAL | Status: DC
  Administered 2019-01-09 – 2019-01-10 (×4): 300 mg via ORAL
  Filled 2019-01-09 (×5): qty 1

## 2019-01-09 MED ORDER — GLYCOPYRROLATE PF 0.2 MG/ML IJ SOSY
PREFILLED_SYRINGE | INTRAMUSCULAR | Status: AC
  Filled 2019-01-09: qty 1

## 2019-01-09 MED ORDER — LACTATED RINGERS IV SOLN
INTRAVENOUS | Status: DC
  Administered 2019-01-09 (×2): via INTRAVENOUS
  Administered 2019-01-09: 1000 mL via INTRAVENOUS

## 2019-01-09 MED ORDER — ACETAMINOPHEN 500 MG PO TABS
1000.0000 mg | ORAL_TABLET | Freq: Four times a day (QID) | ORAL | Status: DC
  Administered 2019-01-09 – 2019-01-11 (×5): 1000 mg via ORAL
  Filled 2019-01-09 (×8): qty 2

## 2019-01-09 MED ORDER — SUGAMMADEX SODIUM 200 MG/2ML IV SOLN
INTRAVENOUS | Status: AC
  Filled 2019-01-09: qty 2

## 2019-01-09 MED ORDER — FENTANYL CITRATE (PF) 100 MCG/2ML IJ SOLN
INTRAMUSCULAR | Status: DC | PRN
  Administered 2019-01-09: 50 ug via INTRAVENOUS
  Administered 2019-01-09 (×2): 25 ug via INTRAVENOUS
  Administered 2019-01-09 (×3): 50 ug via INTRAVENOUS

## 2019-01-09 MED ORDER — ONDANSETRON 4 MG PO TBDP: 4.0000 mg | ORAL_TABLET | Freq: Four times a day (QID) | ORAL | Status: DC | PRN

## 2019-01-09 MED ORDER — BUPIVACAINE LIPOSOME 1.3 % IJ SUSP
INTRAMUSCULAR | Status: AC
  Filled 2019-01-09: qty 20

## 2019-01-09 MED ORDER — KETOROLAC TROMETHAMINE 30 MG/ML IJ SOLN
30.0000 mg | Freq: Four times a day (QID) | INTRAMUSCULAR | Status: AC
  Administered 2019-01-09 – 2019-01-14 (×18): 30 mg via INTRAVENOUS
  Filled 2019-01-09 (×18): qty 1

## 2019-01-09 MED ORDER — EPHEDRINE 5 MG/ML INJ
INTRAVENOUS | Status: AC
  Filled 2019-01-09: qty 10

## 2019-01-09 MED ORDER — SODIUM CHLORIDE 0.9 % IV SOLN
2.0000 g | Freq: Two times a day (BID) | INTRAVENOUS | Status: AC
  Administered 2019-01-09 – 2019-01-10 (×3): 2 g via INTRAVENOUS
  Filled 2019-01-09 (×3): qty 2

## 2019-01-09 MED ORDER — HEPARIN SODIUM (PORCINE) 5000 UNIT/ML IJ SOLN
5000.0000 [IU] | Freq: Three times a day (TID) | INTRAMUSCULAR | Status: DC
  Administered 2019-01-09 – 2019-01-13 (×12): 5000 [IU] via SUBCUTANEOUS
  Filled 2019-01-09 (×12): qty 1

## 2019-01-09 MED ORDER — ONDANSETRON HCL 4 MG/2ML IJ SOLN
4.0000 mg | Freq: Four times a day (QID) | INTRAMUSCULAR | Status: DC | PRN
  Administered 2019-01-10 – 2019-01-11 (×2): 4 mg via INTRAVENOUS
  Filled 2019-01-09 (×3): qty 2

## 2019-01-09 MED ORDER — DIPHENHYDRAMINE HCL 50 MG/ML IJ SOLN: 12.5000 mg | Freq: Four times a day (QID) | INTRAMUSCULAR | Status: DC | PRN

## 2019-01-09 MED ORDER — ACETAMINOPHEN 500 MG PO TABS
1000.0000 mg | ORAL_TABLET | ORAL | Status: AC
  Administered 2019-01-09: 1000 mg via ORAL
  Filled 2019-01-09: qty 2

## 2019-01-09 MED ORDER — OXYCODONE HCL 5 MG PO TABS
5.0000 mg | ORAL_TABLET | ORAL | Status: DC | PRN
  Administered 2019-01-09 – 2019-01-10 (×2): 5 mg via ORAL
  Filled 2019-01-09 (×2): qty 1

## 2019-01-09 MED ORDER — HYDROMORPHONE HCL 1 MG/ML IJ SOLN
0.2500 mg | INTRAMUSCULAR | Status: DC | PRN
  Administered 2019-01-09: 0.5 mg via INTRAVENOUS
  Filled 2019-01-09 (×2): qty 0.5

## 2019-01-09 MED ORDER — EPHEDRINE SULFATE 50 MG/ML IJ SOLN
INTRAMUSCULAR | Status: DC | PRN
  Administered 2019-01-09: 5 mg via INTRAVENOUS

## 2019-01-09 SURGICAL SUPPLY — 56 items
CATH FOLEY 2WAY SLVR  5CC 14FR (CATHETERS) ×1
CATH FOLEY 2WAY SLVR 5CC 14FR (CATHETERS) IMPLANT
CELLS DAT CNTRL 66122 CELL SVR (MISCELLANEOUS) ×1 IMPLANT
COVER LIGHT HANDLE STERIS (MISCELLANEOUS) ×4 IMPLANT
COVER MAYO STAND XLG (DRAPE) ×2 IMPLANT
COVER WAND RF STERILE (DRAPES) ×1 IMPLANT
DRSG OPSITE POSTOP 4X8 (GAUZE/BANDAGES/DRESSINGS) ×2 IMPLANT
ELECT REM PT RETURN 9FT ADLT (ELECTROSURGICAL) ×2
ELECTRODE REM PT RTRN 9FT ADLT (ELECTROSURGICAL) ×1 IMPLANT
FILTER SMOKE EVAC LAPAROSHD (FILTER) ×2 IMPLANT
GLOVE BIO SURGEON STRL SZ 6.5 (GLOVE) ×3 IMPLANT
GLOVE BIO SURGEON STRL SZ7 (GLOVE) ×2 IMPLANT
GLOVE BIOGEL PI IND STRL 6.5 (GLOVE) ×1 IMPLANT
GLOVE BIOGEL PI IND STRL 7.0 (GLOVE) ×3 IMPLANT
GLOVE BIOGEL PI INDICATOR 6.5 (GLOVE) ×2
GLOVE BIOGEL PI INDICATOR 7.0 (GLOVE) ×3
GLOVE SURG SS PI 7.5 STRL IVOR (GLOVE) ×5 IMPLANT
GOWN STRL REUS W/ TWL XL LVL3 (GOWN DISPOSABLE) ×2 IMPLANT
GOWN STRL REUS W/TWL LRG LVL3 (GOWN DISPOSABLE) ×8 IMPLANT
GOWN STRL REUS W/TWL XL LVL3 (GOWN DISPOSABLE) ×2
INST SET LAPROSCOPIC AP (KITS) ×2 IMPLANT
INST SET MAJOR GENERAL (KITS) ×2 IMPLANT
KIT TURNOVER CYSTO (KITS) ×2 IMPLANT
LIGASURE IMPACT 36 18CM CVD LR (INSTRUMENTS) ×1 IMPLANT
LIGASURE LAP ATLAS 10MM 37CM (INSTRUMENTS) ×2 IMPLANT
MANIFOLD NEPTUNE II (INSTRUMENTS) ×2 IMPLANT
NDL HYPO 18GX1.5 BLUNT FILL (NEEDLE) IMPLANT
NDL HYPO 21X1.5 SAFETY (NEEDLE) ×1 IMPLANT
NEEDLE HYPO 18GX1.5 BLUNT FILL (NEEDLE) ×2 IMPLANT
NEEDLE HYPO 21X1.5 SAFETY (NEEDLE) ×2 IMPLANT
NS IRRIG 1000ML POUR BTL (IV SOLUTION) ×4 IMPLANT
PACK COLON (CUSTOM PROCEDURE TRAY) ×2 IMPLANT
PAD ARMBOARD 7.5X6 YLW CONV (MISCELLANEOUS) ×2 IMPLANT
PENCIL HANDSWITCHING (ELECTRODE) ×2 IMPLANT
RELOAD PROXIMATE 75MM BLUE (ENDOMECHANICALS) ×4 IMPLANT
RELOAD STAPLE 75 3.8 BLU REG (ENDOMECHANICALS) IMPLANT
RETRACTOR WND ALEXIS 18 MED (MISCELLANEOUS) IMPLANT
RTRCTR WOUND ALEXIS 18CM MED (MISCELLANEOUS) ×2
SPONGE GAUZE 2X2 8PLY STRL LF (GAUZE/BANDAGES/DRESSINGS) ×2 IMPLANT
SPONGE LAP 18X18 RF (DISPOSABLE) ×2 IMPLANT
STAPLER GUN LINEAR PROX 60 (STAPLE) ×2 IMPLANT
STAPLER PROXIMATE 75MM BLUE (STAPLE) ×2 IMPLANT
STAPLER VISISTAT (STAPLE) ×2 IMPLANT
SUT MNCRL AB 4-0 PS2 18 (SUTURE) ×1 IMPLANT
SUT PDS AB CT VIOLET #0 27IN (SUTURE) ×2 IMPLANT
SUT SILK 3 0 SH CR/8 (SUTURE) ×2 IMPLANT
SUT VIC AB 3-0 SH 27 (SUTURE) ×1
SUT VIC AB 3-0 SH 27X BRD (SUTURE) ×1 IMPLANT
SYR 10ML LL (SYRINGE) ×2 IMPLANT
TRAY FOLEY MTR SLVR 16FR STAT (SET/KITS/TRAYS/PACK) ×2 IMPLANT
TROCAR ENDO BLADELESS 11MM (ENDOMECHANICALS) ×2 IMPLANT
TROCAR XCEL NON-BLD 5MMX100MML (ENDOMECHANICALS) ×1 IMPLANT
TROCAR XCEL UNIV SLVE 11M 100M (ENDOMECHANICALS) ×2 IMPLANT
TUBING INSUFFLATION (TUBING) ×2 IMPLANT
WARMER LAPAROSCOPE (MISCELLANEOUS) ×2 IMPLANT
YANKAUER SUCT BULB TIP 10FT TU (MISCELLANEOUS) ×4 IMPLANT

## 2019-01-09 NOTE — Interval H&P Note (Signed)
History and Physical Interval Note:  01/09/2019 9:21 AM  Andrea Simon  has presented today for surgery, with the diagnosis of transverse colon cancer  The various methods of treatment have been discussed with the patient and family. After consideration of risks, benefits and other options for treatment, the patient has consented to  Procedure(s): HAND ASSISTED LAPAROSCOPIC PARTIAL COLON RESECTION POSSIBLE OPEN (N/A) as a surgical intervention .  [name] history has been reviewed, patient examined, no change in status, stable for surgery.  I have reviewed the patient's chart and labs.  Questions were answered to the patient's satisfaction.    Got a little sick last night with bowel prep / vomited but resolved. No other symptoms. Completed antibiotic prep and was having thin/ watery Bms on completion.  Virl Cagey

## 2019-01-09 NOTE — Anesthesia Preprocedure Evaluation (Addendum)
Anesthesia Evaluation  Patient identified by MRN, date of birth, ID band Patient awake    Reviewed: Allergy & Precautions, H&P , NPO status , Patient's Chart, lab work & pertinent test results  Airway Mallampati: II  TM Distance: >3 FB Neck ROM: full    Dental  (+) Edentulous Upper   Pulmonary neg pulmonary ROS, Current Smoker,    Pulmonary exam normal breath sounds clear to auscultation       Cardiovascular Exercise Tolerance: Good negative cardio ROS   Rhythm:regular Rate:Normal     Neuro/Psych negative neurological ROS  negative psych ROS   GI/Hepatic negative GI ROS, Neg liver ROS,   Endo/Other  diabetes  Renal/GU negative Renal ROS  negative genitourinary   Musculoskeletal   Abdominal   Peds  Hematology negative hematology ROS (+)   Anesthesia Other Findings Colon cancer, transverse  Reproductive/Obstetrics negative OB ROS                            Anesthesia Physical Anesthesia Plan  ASA: III  Anesthesia Plan: General   Post-op Pain Management:    Induction:   PONV Risk Score and Plan:   Airway Management Planned:   Additional Equipment:   Intra-op Plan:   Post-operative Plan:   Informed Consent: I have reviewed the patients History and Physical, chart, labs and discussed the procedure including the risks, benefits and alternatives for the proposed anesthesia with the patient or authorized representative who has indicated his/her understanding and acceptance.     Dental Advisory Given  Plan Discussed with: CRNA  Anesthesia Plan Comments:         Anesthesia Quick Evaluation

## 2019-01-09 NOTE — Transfer of Care (Signed)
Immediate Anesthesia Transfer of Care Note  Patient: Andrea Simon  Procedure(s) Performed: LAPAROSCOPIC RIGHT HEMICOLECTOMY (N/A )  Patient Location: PACU  Anesthesia Type:General  Level of Consciousness: awake, alert  and patient cooperative  Airway & Oxygen Therapy: Patient Spontanous Breathing  Post-op Assessment: Report given to RN and Post -op Vital signs reviewed and stable  Post vital signs: Reviewed and stable  Last Vitals:  Vitals Value Taken Time  BP    Temp    Pulse    Resp    SpO2      Last Pain:  Vitals:   01/09/19 0849  TempSrc: Oral  PainSc: 0-No pain      Patients Stated Pain Goal: 7 (37/10/[birth year] 6948)  Complications: No apparent anesthesia complications

## 2019-01-09 NOTE — Anesthesia Procedure Notes (Signed)
Procedure Name: Intubation Date/Time: 01/09/2019 9:35 AM Performed by: Vista Deck, CRNA Pre-anesthesia Checklist: Patient identified, Patient being monitored, Timeout performed, Emergency Drugs available and Suction available Patient Re-evaluated:Patient Re-evaluated prior to induction Oxygen Delivery Method: Circle System Utilized Preoxygenation: Pre-oxygenation with 100% oxygen Induction Type: IV induction Ventilation: Mask ventilation without difficulty Laryngoscope Size: Miller and 2 Grade View: Grade I Tube type: Oral Tube size: 7.0 mm Number of attempts: 1 Airway Equipment and Method: Stylet Placement Confirmation: ETT inserted through vocal cords under direct vision,  positive ETCO2 and breath sounds checked- equal and bilateral Secured at: 22 cm Tube secured with: Tape Dental Injury: Teeth and Oropharynx as per pre-operative assessment

## 2019-01-09 NOTE — Anesthesia Postprocedure Evaluation (Signed)
Anesthesia Post Note  Patient: Andrea Simon  Procedure(s) Performed: LAPAROSCOPIC RIGHT HEMICOLECTOMY (N/A )  Patient location during evaluation: PACU Anesthesia Type: General Level of consciousness: awake and patient cooperative Pain management: satisfactory to patient Vital Signs Assessment: post-procedure vital signs reviewed and stable Respiratory status: spontaneous breathing and patient connected to nasal cannula oxygen Cardiovascular status: stable Postop Assessment: no apparent nausea or vomiting Anesthetic complications: no     Last Vitals:  Vitals:   01/09/19 1200 01/09/19 1215  BP: 137/72 (!) 141/64  Pulse: 65 65  Resp: 20 20  Temp:    SpO2: 100% 100%    Last Pain:  Vitals:   01/09/19 1215  TempSrc:   PainSc: 5                  Jalia Zuniga

## 2019-01-09 NOTE — Op Note (Signed)
[facility] Operative Note  01/09/19  Preoperative Diagnosis:  Transverse colon cancer    Postoperative Diagnosis: Same   Procedure(s) Performed: Laparoscopic right hemicolectomy (extracorporeal side to side anastomosis)   Surgeon: Lanell Matar. Constance Haw, MD   Assistants: Aviva Signs, MD     Anesthesia: General endotracheal   Anesthesiologist:  Dr. Rick Duff   Specimens: Right colon   Estimated Blood Loss: Minimal   Blood Replacement: None    Complications: None   Wound Class: Clean Contaminated    Operative Indications: Andrea Simon is a 63 yo who had a positive cologuard and subsequent colonoscopy with colon cancer noted at the transverse colon. She had no prior symptoms of melena or BRBPR or unintentional weight loss leading up to the diagnosis. We discussed the cancer and the risk and benefits of surgery including but not limited bleeding, infection, possible need for chemotherapy, laparoscopic approach and possible hand assistance or open procedure, risk of injury to a ureter or other organ, and plans for an anastomosis and the complications of leak associated with anastomosis.  We also discussed her bowel preparation and need for that preoperatively. After this discussion, she opted to proceed with the planned surgery.   Findings:Redundant colon with colon cancer in the proximal transverse colon.    Procedure: The patient was taken to the operating room and placed supine. General endotracheal anesthesia was induced. Intravenous antibiotics were administered per protocol.  An orogastric tube positioned to decompress the stomach. A foley catehter was placed. The abdomen was prepared and draped in the usual sterile fashion. A JACHO approved time out was performed.   A supraumbilical incision was made and a Veress was introduced into the abdominal cavity and pneumoperitoneum was achieved.  Using an Optiview trocar, the abdomen was entered with a zero degree scope. The  abdomen was investigated and a large redundant colon was noted. No signs of liver metastasis was seen. An additional 19mm trocar was placed in the supra pubic region and a 12 mm trocar was placed in the left lower quadrant/ anterior axillary line region.  The patient was placed in the reverse trendelenburg position with the right side up to allow the small bowel to drop clear of the operative field.   The 10 mm 30 degree scope was inserted into the abdomen through the supra umbilical site, and the cecum and transverse colon were investigated. The colon was redundant and the mass was found in the transverse colon.  The transverse colon was so redundant that the lateral mobilization was performed first. Using the laparoscopic shears and Ligasure the white line of Toldt was taken down from the cecum up the ascending colon.  The attachments to the hepatic flexure were taken down, and then the transverse colon as lifted up and the lesser sac was opened, leaving the omentum at the mass attached to the specimen.  This dissection was continued medial to lateral to meet with the dissection of the hepatic flexure.  With gentle blunt sweeping the retroperitoneal attachments were taken down. The duodenum was noted and protected.  The colon was fully mobilized laterally, and was extremely redundant on a long mesentery. Given this and limited space intra-abdominally, the remainder of the dissection was performed through a 4-5 cm incision made at the supraumbilical port site.  A wound protector was placed. The colon was eviscerated from the abdomen, and minor attachments of the terminal ileum to the right gutter were taken down with cautery.  The right colon was completely mobilized and  the retroperitoneum and right ureter were protected.    The distal point of transection was determined, and the colon was divided with a 75 mm linear cutting stapler. The proximal point of transection was determined and the ileum was divided  with a 75 mm linear cutting stapler.  The ileocolic vessels were palpated and were taken with a Ligasure at the take off of the superior mesenteric vessels.  Care was taken to ensure that the duodenum and retroperitoneal structures were protected. The remaining mesentery was taken with the Ligasure.    The ileum and transverse colon was aligned with no mesenteric twisting, and a side to side stapled anastomosis was created with a 75 mm linear cutting stapler. A TIA stapler was used to close the common colotomy.  The staple line was oversewn with Lemberted 3-0 silk suture and a crotch stitch was placed with 3-0 silk suture.  The mesentery was closed with 3-0 Vicryl. The bowel was placed back into the peritoneal cavity. Irrigation was performed. Hemostasis was achieved.   The entire team changed gowns and gloves and new drapes were placed and the closing trays were used following the end of the dirty part of the case as per protocol.  The remain trocars were removed. The fascia at the extraction site was closed with 0 PDS suture and the skin was closed with staples. The port sites were closed with staples. A honeycomb dressing and gauze dressings were placed.   Dr. Arnoldo Morale was assisting throughout the procedure and was present for the critical portions of the case.   Final inspection revealed acceptable hemostasis. All counts were correct at the end of the case. The patient was awakened from anesthesia and extubate without complication.  The patient went to the PACU in stable condition.   Andrea Labrum, MD [facility] [address] [place], [place] [address] [phone] (office)

## 2019-01-10 ENCOUNTER — Encounter (HOSPITAL_COMMUNITY): Payer: Self-pay | Admitting: General Surgery

## 2019-01-10 LAB — BASIC METABOLIC PANEL
Anion gap: 7 (ref 5–15)
BUN: 5 mg/dL — ABNORMAL LOW (ref 8–23)
CO2: 27 mmol/L (ref 22–32)
Calcium: 8.6 mg/dL — ABNORMAL LOW (ref 8.9–10.3)
Chloride: 106 mmol/L (ref 98–111)
Creatinine, Ser: 0.86 mg/dL (ref 0.44–1.00)
GFR calc Af Amer: >60 mL/min (ref >60)
GFR calc non Af Amer: >60 mL/min (ref >60)
Glucose, Bld: 91 mg/dL (ref 70–99)
Potassium: 3.5 mmol/L (ref 3.5–5.1)
Sodium: 140 mmol/L (ref 135–145)

## 2019-01-10 LAB — CBC WITH DIFFERENTIAL/PLATELET
Abs Immature Granulocytes: 0.01 10*3/uL (ref 0.00–0.07)
BASOS ABS: 0 10*3/uL (ref 0.0–0.1)
Basophils Relative: 0 %
Eosinophils Absolute: 0 10*3/uL (ref 0.0–0.5)
Eosinophils Relative: 1 %
HCT: 31.9 % — ABNORMAL LOW (ref 36.0–46.0)
Hemoglobin: 10.7 g/dL — ABNORMAL LOW (ref 12.0–15.0)
IMMATURE GRANULOCYTES: 0 %
Lymphocytes Relative: 33 %
Lymphs Abs: 1.8 10*3/uL (ref 0.7–4.0)
MCH: 36 pg — ABNORMAL HIGH (ref 26.0–34.0)
MCHC: 33.5 g/dL (ref 30.0–36.0)
MCV: 107.4 fL — ABNORMAL HIGH (ref 80.0–100.0)
Monocytes Absolute: 0.4 10*3/uL (ref 0.1–1.0)
Monocytes Relative: 7 %
NEUTROS PCT: 59 %
NRBC: 0 % (ref 0.0–0.2)
Neutro Abs: 3.3 10*3/uL (ref 1.7–7.7)
Platelets: 195 10*3/uL (ref 150–400)
RBC: 2.97 MIL/uL — AB (ref 3.87–5.11)
RDW: 13.7 % (ref 11.5–15.5)
WBC: 5.4 10*3/uL (ref 4.0–10.5)

## 2019-01-10 LAB — PHOSPHORUS: Phosphorus: 3.8 mg/dL (ref 2.5–4.6)

## 2019-01-10 LAB — MAGNESIUM: Magnesium: 1.5 mg/dL — ABNORMAL LOW (ref 1.7–2.4)

## 2019-01-10 LAB — GLUCOSE, CAPILLARY
GLUCOSE-CAPILLARY: 98 mg/dL (ref 70–99)
GLUCOSE-CAPILLARY: 108 mg/dL — AB (ref 70–99)

## 2019-01-10 MED ORDER — MAGNESIUM SULFATE 2 GM/50ML IV SOLN
2.0000 g | Freq: Once | INTRAVENOUS | Status: AC
  Administered 2019-01-10: 2 g via INTRAVENOUS
  Filled 2019-01-10: qty 50

## 2019-01-10 MED ORDER — POTASSIUM CHLORIDE CRYS ER 20 MEQ PO TBCR
40.0000 meq | EXTENDED_RELEASE_TABLET | Freq: Two times a day (BID) | ORAL | Status: AC
  Administered 2019-01-10 (×2): 40 meq via ORAL
  Filled 2019-01-10: qty 4
  Filled 2019-01-10: qty 2

## 2019-01-10 NOTE — Progress Notes (Signed)
[facility] Progress Note  1 Day Post-Op  Subjective: Doing well today. Had BMs. UOP good overall. Some bloating this evening.   Objective: Vital signs in last 24 hours: Temp:  [98.2 F (36.8 C)-98.4 F (36.9 C)] 98.4 F (36.9 C) (01/21 1352) Pulse Rate:  [59-63] 63 (01/21 1352) Resp:  [17-18] 18 (01/21 1352) BP: (123-136)/(65-91) 127/91 (01/21 1352) SpO2:  [99 %-100 %] 99 % (01/21 1352) Last BM Date: 01/10/19  Intake/Output from previous day: 01/20 0701 - 01/21 0700 In: 2440 [P.O.:240; I.V.:2200] Out: 200 [Urine:175; Blood:25] Intake/Output this shift: No intake/output data recorded.  General appearance: alert, cooperative and no distress Resp: normal work breathing GI: soft, mildly distended, appropriately tender, dressing c/d/i  Extremities: extremities normal, atraumatic, no cyanosis or edema  Lab Results:  Recent Labs    01/10/19 0553  WBC 5.4  HGB 10.7*  HCT 31.9*  PLT 195   BMET Recent Labs    01/10/19 0553  NA 140  K 3.5  CL 106  CO2 27  GLUCOSE 91  BUN 5*  CREATININE 0.86  CALCIUM 8.6*   Anti-infectives: Anti-infectives (From admission, onward)   Start     Dose/Rate Route Frequency Ordered Stop   01/09/19 2200  cefoTEtan (CEFOTAN) 2 g in sodium chloride 0.9 % 100 mL IVPB     2 g 200 mL/hr over 30 Minutes Intravenous Every 12 hours 01/09/19 1413 01/11/19 0959   01/09/19 0830  cefoTEtan (CEFOTAN) 2 g in sodium chloride 0.9 % 100 mL IVPB     2 g 200 mL/hr over 30 Minutes Intravenous On call to O.R. 01/09/19 0821 01/09/19 1005      Assessment/Plan: Andrea Simon is 63 yo POD 1 s/p Laparoscopic right hemicolectomy for colon cancer. Doing well.  PRN For pain IS, OOB Clears, had BMs, advanced to fulls but warned to not over do it given the bloating, will keep entereg until have more BM given distention  Labs reviewed, K and Mg replaced H&H down slightly, monitor  SCDs, heparin sq     LOS: 1 day    Virl Cagey 01/10/2019

## 2019-01-11 ENCOUNTER — Inpatient Hospital Stay (HOSPITAL_COMMUNITY): Payer: 59

## 2019-01-11 LAB — CBC WITH DIFFERENTIAL/PLATELET
Abs Immature Granulocytes: 0.02 10*3/uL (ref 0.00–0.07)
Basophils Absolute: 0 10*3/uL (ref 0.0–0.1)
Basophils Relative: 0 %
EOS PCT: 0 %
Eosinophils Absolute: 0 10*3/uL (ref 0.0–0.5)
HCT: 36.7 % (ref 36.0–46.0)
Hemoglobin: 12.2 g/dL (ref 12.0–15.0)
Immature Granulocytes: 0 %
Lymphocytes Relative: 14 %
Lymphs Abs: 1.1 10*3/uL (ref 0.7–4.0)
MCH: 35.7 pg — ABNORMAL HIGH (ref 26.0–34.0)
MCHC: 33.2 g/dL (ref 30.0–36.0)
MCV: 107.3 fL — ABNORMAL HIGH (ref 80.0–100.0)
Monocytes Absolute: 0.3 10*3/uL (ref 0.1–1.0)
Monocytes Relative: 4 %
Neutro Abs: 6.2 10*3/uL (ref 1.7–7.7)
Neutrophils Relative %: 82 %
Platelets: 223 10*3/uL (ref 150–400)
RBC: 3.42 MIL/uL — ABNORMAL LOW (ref 3.87–5.11)
RDW: 13.9 % (ref 11.5–15.5)
WBC: 7.6 10*3/uL (ref 4.0–10.5)
nRBC: 0 % (ref 0.0–0.2)

## 2019-01-11 LAB — BASIC METABOLIC PANEL
ANION GAP: 8 (ref 5–15)
BUN: 5 mg/dL — ABNORMAL LOW (ref 8–23)
CO2: 27 mmol/L (ref 22–32)
Calcium: 9.1 mg/dL (ref 8.9–10.3)
Chloride: 103 mmol/L (ref 98–111)
Creatinine, Ser: 0.87 mg/dL (ref 0.44–1.00)
GFR calc Af Amer: >60 mL/min (ref >60)
GFR calc non Af Amer: >60 mL/min (ref >60)
Glucose, Bld: 116 mg/dL — ABNORMAL HIGH (ref 70–99)
POTASSIUM: 3.9 mmol/L (ref 3.5–5.1)
Sodium: 138 mmol/L (ref 135–145)

## 2019-01-11 LAB — GLUCOSE, CAPILLARY
GLUCOSE-CAPILLARY: 85 mg/dL (ref 70–99)
Glucose-Capillary: 101 mg/dL — ABNORMAL HIGH (ref 70–99)

## 2019-01-11 LAB — PHOSPHORUS: PHOSPHORUS: 3.7 mg/dL (ref 2.5–4.6)

## 2019-01-11 LAB — MAGNESIUM: Magnesium: 1.8 mg/dL (ref 1.7–2.4)

## 2019-01-11 MED ORDER — PANTOPRAZOLE SODIUM 40 MG IV SOLR
40.0000 mg | INTRAVENOUS | Status: DC
  Administered 2019-01-11 – 2019-01-14 (×4): 40 mg via INTRAVENOUS
  Filled 2019-01-11 (×4): qty 40

## 2019-01-11 MED ORDER — PROMETHAZINE HCL 25 MG/ML IJ SOLN
12.5000 mg | Freq: Four times a day (QID) | INTRAMUSCULAR | Status: DC | PRN
  Administered 2019-01-11: 12.5 mg via INTRAVENOUS
  Filled 2019-01-11: qty 1

## 2019-01-11 NOTE — Progress Notes (Signed)
NG tube inserted left nare at 1430 and 1200 mls green stomach contents returned with xray verification of placement.  Since then patient has not complained of nausea.

## 2019-01-11 NOTE — Progress Notes (Signed)
[facility] Progress Note  2 Days Post-Op  Subjective: Doing fair but had nausea and vomiting this Am. Ng placed by RN. Xray with dilated loops consisted with ileus.   Objective: Vital signs in last 24 hours: Temp:  [98.7 F (37.1 C)-99 F (37.2 C)] 98.7 F (37.1 C) (01/22 0601) Pulse Rate:  [62-64] 64 (01/22 0601) Resp:  [16] 16 (01/22 0601) BP: (140-155)/(75-111) 140/75 (01/22 0601) SpO2:  [99 %-100 %] 99 % (01/22 0601) Last BM Date: 01/11/19  Intake/Output from previous day: 01/21 0701 - 01/22 0700 In: 2245.3 [P.O.:480; I.V.:1665.3; IV Piggyback:100] Out: 2150 [Urine:2150] Intake/Output this shift: Total I/O In: 240 [P.O.:240] Out: 100 [Urine:100]  General appearance: alert, cooperative and no distress Resp: normal work of breathing GI: soft, distended, abdominal binder in place, honeycomb dressing in place, no drainage or erythema  Lab Results:  Recent Labs    01/10/19 0553 01/11/19 0522  WBC 5.4 7.6  HGB 10.7* 12.2  HCT 31.9* 36.7  PLT 195 223   BMET Recent Labs    01/10/19 0553 01/11/19 0522  NA 140 138  K 3.5 3.9  CL 106 103  CO2 27 27  GLUCOSE 91 116*  BUN 5* 5*  CREATININE 0.86 0.87  CALCIUM 8.6* 9.1   Studies/Results: Dg Abd 1 View  Result Date: 01/11/2019 CLINICAL DATA:  Nausea and vomiting, status post right hemicolectomy 2 days prior EXAM: ABDOMEN - 1 VIEW COMPARISON:  12/16/2018 CT abdomen/pelvis FINDINGS: Vertical skin staples are noted to the right of the lumbar spine and overlying the left lower quadrant and deep pelvis just to the right of midline. There is mild dilatation of small and large bowel loops throughout the abdomen. No evidence of pneumatosis or pneumoperitoneum. No radiopaque nephrolithiasis. IMPRESSION: Mild dilatation of small and large bowel loops throughout the abdomen, compatible with mild postoperative adynamic ileus. Electronically Signed   By: Ilona Sorrel M.D.   On: 01/11/2019 10:45   Dg Chest Port 1  View  Result Date: 01/11/2019 CLINICAL DATA:  Inpatient NG tube placement EXAM: PORTABLE CHEST 1 VIEW FINDINGS: NG tube extends into stomach. The tip is the gastric antrum. Lungs are clear. IMPRESSION: NG tube extends into the stomach. Electronically Signed   By: Suzy Bouchard M.D.   On: 01/11/2019 14:50    Anti-infectives: Anti-infectives (From admission, onward)   Start     Dose/Rate Route Frequency Ordered Stop   01/09/19 2200  cefoTEtan (CEFOTAN) 2 g in sodium chloride 0.9 % 100 mL IVPB     2 g 200 mL/hr over 30 Minutes Intravenous Every 12 hours 01/09/19 1413 01/10/19 2315   01/09/19 0830  cefoTEtan (CEFOTAN) 2 g in sodium chloride 0.9 % 100 mL IVPB     2 g 200 mL/hr over 30 Minutes Intravenous On call to O.R. 01/09/19 0821 01/09/19 1005      Assessment/Plan: Andrea Simon is a 63 yo POD 2 s/p Lap R hemicolectomy for colon cancer. Doing fair. Pathology with 1/25 LN positive and through the serosa to the omentum, should be Stage IIIb. Informed patient of this and will get referral to Oncology.  PRN for pain IS, OOB NG in place, await bowel function Ambulate Labs in AM, lytes replaced LR @ 75 continue, foley out SCDs, heparin sq   LOS: 2 days    Virl Cagey 01/11/2019

## 2019-01-12 LAB — CBC WITH DIFFERENTIAL/PLATELET
Abs Immature Granulocytes: 0.01 10*3/uL (ref 0.00–0.07)
Basophils Absolute: 0 10*3/uL (ref 0.0–0.1)
Basophils Relative: 0 %
Eosinophils Absolute: 0.1 10*3/uL (ref 0.0–0.5)
Eosinophils Relative: 1 %
HEMATOCRIT: 32.8 % — AB (ref 36.0–46.0)
Hemoglobin: 11 g/dL — ABNORMAL LOW (ref 12.0–15.0)
Immature Granulocytes: 0 %
Lymphocytes Relative: 30 %
Lymphs Abs: 1.6 10*3/uL (ref 0.7–4.0)
MCH: 35.8 pg — ABNORMAL HIGH (ref 26.0–34.0)
MCHC: 33.5 g/dL (ref 30.0–36.0)
MCV: 106.8 fL — ABNORMAL HIGH (ref 80.0–100.0)
Monocytes Absolute: 0.4 10*3/uL (ref 0.1–1.0)
Monocytes Relative: 7 %
Neutro Abs: 3.3 10*3/uL (ref 1.7–7.7)
Neutrophils Relative %: 62 %
Platelets: 198 10*3/uL (ref 150–400)
RBC: 3.07 MIL/uL — ABNORMAL LOW (ref 3.87–5.11)
RDW: 13.7 % (ref 11.5–15.5)
WBC: 5.3 10*3/uL (ref 4.0–10.5)
nRBC: 0 % (ref 0.0–0.2)

## 2019-01-12 LAB — GLUCOSE, CAPILLARY
Glucose-Capillary: 75 mg/dL (ref 70–99)
Glucose-Capillary: 68 mg/dL — ABNORMAL LOW (ref 70–99)

## 2019-01-12 LAB — BASIC METABOLIC PANEL
Anion gap: 9 (ref 5–15)
BUN: 8 mg/dL (ref 8–23)
CO2: 25 mmol/L (ref 22–32)
CREATININE: 0.8 mg/dL (ref 0.44–1.00)
Calcium: 8.9 mg/dL (ref 8.9–10.3)
Chloride: 106 mmol/L (ref 98–111)
GFR calc Af Amer: >60 mL/min (ref >60)
GFR calc non Af Amer: >60 mL/min (ref >60)
Glucose, Bld: 87 mg/dL (ref 70–99)
Potassium: 3.6 mmol/L (ref 3.5–5.1)
Sodium: 140 mmol/L (ref 135–145)

## 2019-01-12 LAB — PHOSPHORUS: Phosphorus: 3.3 mg/dL (ref 2.5–4.6)

## 2019-01-12 LAB — MAGNESIUM: Magnesium: 1.6 mg/dL — ABNORMAL LOW (ref 1.7–2.4)

## 2019-01-12 MED ORDER — DEXTROSE 50 % IV SOLN
INTRAVENOUS | Status: AC
  Administered 2019-01-12: 25 mL
  Filled 2019-01-12: qty 50

## 2019-01-12 MED ORDER — POTASSIUM CHLORIDE 10 MEQ/100ML IV SOLN
10.0000 meq | INTRAVENOUS | Status: AC
  Administered 2019-01-12 (×2): 10 meq via INTRAVENOUS
  Filled 2019-01-12 (×2): qty 100

## 2019-01-12 MED ORDER — DEXTROSE 50 % IV SOLN
INTRAVENOUS | Status: AC
  Administered 2019-01-12: 50 mL
  Filled 2019-01-12: qty 50

## 2019-01-12 MED ORDER — POTASSIUM CHLORIDE 10 MEQ/100ML IV SOLN
10.0000 meq | INTRAVENOUS | Status: DC
  Administered 2019-01-12: 10 meq via INTRAVENOUS

## 2019-01-12 MED ORDER — DEXTROSE 50 % IV SOLN: 25.0000 g | Freq: Once | INTRAVENOUS | Status: DC

## 2019-01-12 MED ORDER — MAGNESIUM SULFATE 4 GM/100ML IV SOLN
4.0000 g | Freq: Once | INTRAVENOUS | Status: AC
  Administered 2019-01-12: 4 g via INTRAVENOUS
  Filled 2019-01-12: qty 100

## 2019-01-12 NOTE — Progress Notes (Signed)
Abdominal binder received to replace previously soiled binder. Pt states "we can put it on later."

## 2019-01-12 NOTE — Progress Notes (Signed)
[facility] Progress Note  3 Days Post-Op  Subjective: Doing fair. Ng with high output. Some flatus. No nausea.   Objective: Vital signs in last 24 hours: Temp:  [98.6 F (37 C)-98.9 F (37.2 C)] 98.9 F (37.2 C) (01/23 1406) Pulse Rate:  [64-70] 70 (01/23 1406) Resp:  [20] 20 (01/23 1406) BP: (128-142)/(69-84) 128/84 (01/23 1406) SpO2:  [96 %] 96 % (01/23 1406) Last BM Date: 01/11/19  Intake/Output from previous day: 01/22 0701 - 01/23 0700 In: 1804.9 [P.O.:240; I.V.:1564.9] Out: 1925 [Urine:1100; Emesis/NG output:825] Intake/Output this shift: Total I/O In: 100 [IV Piggyback:100] Out: 450 [Urine:200; Emesis/NG output:250]  General appearance: alert, cooperative and no distress Resp: normal work breathing GI: soft, distended, appropriately tender, honeycomb c/d/i with staples, no erythema, port site bandage removed and staples c/d/i  Extremities: extremities normal, atraumatic, no cyanosis or edema  Lab Results:  Recent Labs    01/11/19 0522 01/12/19 0547  WBC 7.6 5.3  HGB 12.2 11.0*  HCT 36.7 32.8*  PLT 223 198   BMET Recent Labs    01/11/19 0522 01/12/19 0547  NA 138 140  K 3.9 3.6  CL 103 106  CO2 27 25  GLUCOSE 116* 87  BUN 5* 8  CREATININE 0.87 0.80  CALCIUM 9.1 8.9    Studies/Results: Dg Abd 1 View  Result Date: 01/11/2019 CLINICAL DATA:  Nausea and vomiting, status post right hemicolectomy 2 days prior EXAM: ABDOMEN - 1 VIEW COMPARISON:  12/16/2018 CT abdomen/pelvis FINDINGS: Vertical skin staples are noted to the right of the lumbar spine and overlying the left lower quadrant and deep pelvis just to the right of midline. There is mild dilatation of small and large bowel loops throughout the abdomen. No evidence of pneumatosis or pneumoperitoneum. No radiopaque nephrolithiasis. IMPRESSION: Mild dilatation of small and large bowel loops throughout the abdomen, compatible with mild postoperative adynamic ileus. Electronically Signed    By: Ilona Sorrel M.D.   On: 01/11/2019 10:45   Dg Chest Port 1 View  Result Date: 01/11/2019 CLINICAL DATA:  Inpatient NG tube placement EXAM: PORTABLE CHEST 1 VIEW FINDINGS: NG tube extends into stomach. The tip is the gastric antrum. Lungs are clear. IMPRESSION: NG tube extends into the stomach. Electronically Signed   By: Suzy Bouchard M.D.   On: 01/11/2019 14:50    Anti-infectives: Anti-infectives (From admission, onward)   Start     Dose/Rate Route Frequency Ordered Stop   01/09/19 2200  cefoTEtan (CEFOTAN) 2 g in sodium chloride 0.9 % 100 mL IVPB     2 g 200 mL/hr over 30 Minutes Intravenous Every 12 hours 01/09/19 1413 01/10/19 2315   01/09/19 0830  cefoTEtan (CEFOTAN) 2 g in sodium chloride 0.9 % 100 mL IVPB     2 g 200 mL/hr over 30 Minutes Intravenous On call to O.R. 01/09/19 0821 01/09/19 1005      Assessment/Plan: Ms. Mabin is a 63 yo POD 3 s/p Lap R hemicolectomy for colon cancer. Doing fair but has ileus. Down a little about + LN and + extension into omentum.  PRN for pain IS, OOB, ambulate NPO, NG  No leukocytosis, and H&H looking good Continue LR while NPO with NG  SCDs, heparin sq  UOP good    LOS: 3 days    Virl Cagey 01/12/2019

## 2019-01-12 NOTE — Progress Notes (Signed)
BS 60, Dextrose given at 2115, BS 107 at this time. Will continue to monitor.

## 2019-01-13 LAB — GLUCOSE, CAPILLARY
GLUCOSE-CAPILLARY: 63 mg/dL — AB (ref 70–99)
Glucose-Capillary: 79 mg/dL (ref 70–99)
Glucose-Capillary: 79 mg/dL (ref 70–99)
Glucose-Capillary: 79 mg/dL (ref 70–99)

## 2019-01-13 MED ORDER — OXYCODONE HCL 5 MG PO TABS: 5.0000 mg | ORAL_TABLET | ORAL | Status: DC | PRN

## 2019-01-13 MED ORDER — ENOXAPARIN SODIUM 40 MG/0.4ML ~~LOC~~ SOLN
40.0000 mg | SUBCUTANEOUS | Status: DC
  Administered 2019-01-13 – 2019-01-14 (×2): 40 mg via SUBCUTANEOUS
  Filled 2019-01-13 (×2): qty 0.4

## 2019-01-13 MED ORDER — DEXTROSE 50 % IV SOLN
12.5000 g | INTRAVENOUS | Status: AC
  Administered 2019-01-13: 12.5 g via INTRAVENOUS
  Filled 2019-01-13: qty 50

## 2019-01-13 MED ORDER — KCL IN DEXTROSE-NACL 10-5-0.45 MEQ/L-%-% IV SOLN
INTRAVENOUS | Status: DC
  Administered 2019-01-13 – 2019-01-15 (×3): via INTRAVENOUS
  Filled 2019-01-13 (×4): qty 1000

## 2019-01-13 MED ORDER — KCL IN DEXTROSE-NACL 10-5-0.45 MEQ/L-%-% IV SOLN: INTRAVENOUS | Status: DC

## 2019-01-13 NOTE — Progress Notes (Signed)
[facility] Progress Note  4 Days Post-Op  Subjective: Feeling ok this am. Had one small BM and flatus. NG with minimal out. NG removed.   Objective: Vital signs in last 24 hours: Temp:  [98.1 F (36.7 C)-98.5 F (36.9 C)] 98.1 F (36.7 C) (01/24 2104) Pulse Rate:  [64-72] 64 (01/24 2104) Resp:  [18-20] 19 (01/24 2104) BP: (145-173)/(71-84) 145/72 (01/24 2104) SpO2:  [97 %-99 %] 98 % (01/24 2104) Last BM Date: 01/13/19  Intake/Output from previous day: 01/23 0701 - 01/24 0700 In: 2289.7 [I.V.:2089.7; IV Piggyback:200] Out: 2250 [Urine:2000; Emesis/NG output:250] Intake/Output this shift: No intake/output data recorded.  General appearance: alert, cooperative and no distress Resp: normal work breathing GI: soft, mildly distended, appropriately tender, staples c/d/i with minimal old blood at umbilicus   Lab Results:  Recent Labs    01/11/19 0522 01/12/19 0547  WBC 7.6 5.3  HGB 12.2 11.0*  HCT 36.7 32.8*  PLT 223 198   BMET Recent Labs    01/11/19 0522 01/12/19 0547  NA 138 140  K 3.9 3.6  CL 103 106  CO2 27 25  GLUCOSE 116* 87  BUN 5* 8  CREATININE 0.87 0.80  CALCIUM 9.1 8.9     Assessment/Plan: Ms. Marrone is a 63 yo POD 4 s/p Lap Right hemicolectomy for colon cancer. Doing fair.  PRN for pain IS, OOB Walking NG out, sips today Will resume diet after another BM or tomorrow NO labs today, if here over weekend will recheck on Sunday SCDs, heparin s q  UOP good    LOS: 4 days    Virl Cagey 01/13/2019

## 2019-01-14 LAB — GLUCOSE, CAPILLARY
GLUCOSE-CAPILLARY: 81 mg/dL (ref 70–99)
Glucose-Capillary: 86 mg/dL (ref 70–99)
Glucose-Capillary: 81 mg/dL (ref 70–99)

## 2019-01-14 MED ORDER — DOCUSATE SODIUM 100 MG PO CAPS
100.0000 mg | ORAL_CAPSULE | Freq: Two times a day (BID) | ORAL | Status: DC
  Administered 2019-01-14 – 2019-01-15 (×3): 100 mg via ORAL
  Filled 2019-01-14 (×3): qty 1

## 2019-01-14 MED ORDER — ACETAMINOPHEN 500 MG PO TABS: 1000.0000 mg | ORAL_TABLET | Freq: Four times a day (QID) | ORAL | Status: DC | PRN

## 2019-01-14 MED ORDER — IBUPROFEN 800 MG PO TABS: 800.0000 mg | ORAL_TABLET | Freq: Four times a day (QID) | ORAL | Status: DC | PRN

## 2019-01-14 NOTE — Progress Notes (Signed)
[facility] Progress Note  5 Days Post-Op  Subjective: Only having soreness. Had small BM.   Objective: Vital signs in last 24 hours: Temp:  [97.8 F (36.6 C)-98.7 F (37.1 C)] 98.7 F (37.1 C) (01/25 1332) Pulse Rate:  [63-64] 63 (01/25 1332) Resp:  [16-20] 16 (01/25 1332) BP: (145-158)/(72-96) 148/80 (01/25 1332) SpO2:  [94 %-99 %] 99 % (01/25 1332) Last BM Date: 01/14/19  Intake/Output from previous day: 01/24 0701 - 01/25 0700 In: 600.8 [I.V.:600.8] Out: 600 [Urine:600] Intake/Output this shift: Total I/O In: 240 [P.O.:240] Out: 550 [Urine:550]  General appearance: alert, cooperative and no distress Resp: normal work breathing GI: soft, minimally distended, appropriately tender  Lab Results:  Recent Labs    01/12/19 0547  WBC 5.3  HGB 11.0*  HCT 32.8*  PLT 198   BMET Recent Labs    01/12/19 0547  NA 140  K 3.6  CL 106  CO2 25  GLUCOSE 87  BUN 8  CREATININE 0.80  CALCIUM 8.9     Assessment/Plan: Ms. Jungman is a 63 yo POD 5 s/p Lap Right hemicolectomy for colon cancer.  PRN for pain IS, OOB Walking Clear and adv as tolerates  Ibuprofen and tylenol added for pain / soreness SCDs, lovenox sq  UOP good   LOS: 5 days    Virl Cagey 01/14/2019

## 2019-01-15 MED ORDER — POLYETHYLENE GLYCOL 3350 17 G PO PACK: 17.0000 g | PACK | Freq: Every day | ORAL | Status: DC

## 2019-01-15 MED ORDER — DOCUSATE SODIUM 100 MG PO CAPS: 100.0000 mg | ORAL_CAPSULE | Freq: Two times a day (BID) | ORAL | 1 refills | Status: DC

## 2019-01-15 MED ORDER — ONDANSETRON 4 MG PO TBDP: 4.0000 mg | ORAL_TABLET | Freq: Four times a day (QID) | ORAL | 0 refills | Status: DC | PRN

## 2019-01-15 MED ORDER — OXYCODONE HCL 5 MG PO TABS: 5.0000 mg | ORAL_TABLET | ORAL | 0 refills | Status: DC | PRN

## 2019-01-15 NOTE — Progress Notes (Signed)
Advanced to soft diet last night for supper but only ate fruit and a few bites of other food due to no appetite. Did not have pain or nausea.

## 2019-01-15 NOTE — Discharge Summary (Signed)
Physician Discharge Summary  Patient ID: NIAJA STICKLEY MRN: [id number] DOB/AGE: Mar 12, 1956 63 y.o.  Admit date: 01/09/2019 Discharge date: 01/16/2019  Admission Diagnoses: Colon Cancer   Discharge Diagnoses:  Principal Problem:   Malignant neoplasm of transverse colon [facility]) Active Problems:   Colon cancer [facility])   Discharged Condition: good  Hospital Course: Ms. Matos is a 63 yo with a transverse colon cancer who came into the hospital for a laparoscopic right hemicolectomy.  She did fair post operatively but did experience an ileus that caused her to have an NG tube and NPO status on POD 2.  She started to have bowel function, and was progressed to a soft diet prior to discharge.  She was ambulating, had good pain control, and was having bowel movements prior to her discharge home.    Consults: None  Significant Diagnostic Studies: KUB- Small bowel dilation- ileus   Treatments: Laparoscopic right hemicolectomy 01/09/2019   Discharge Exam: Blood pressure (!) 157/87, pulse 66, temperature 98.7 F (37.1 C), temperature source Oral, resp. rate 18, height 5\' 4"  (1.626 m), weight 62.8 kg, SpO2 99 %. General appearance: alert, cooperative and no distress Resp: normal work breathing GI: soft, minimally distended, appropriately tender, staples c/d/i with no erythema or drainage  Disposition:    Discharge Instructions    Call MD for:  difficulty breathing, headache or visual disturbances   Complete by:  As directed    Call MD for:  extreme fatigue   Complete by:  As directed    Call MD for:  persistant dizziness or light-headedness   Complete by:  As directed    Call MD for:  persistant nausea and vomiting   Complete by:  As directed    Call MD for:  redness, tenderness, or signs of infection (pain, swelling, redness, odor or green/yellow discharge around incision site)   Complete by:  As directed    Call MD for:  severe uncontrolled pain   Complete by:  As directed     Call MD for:  temperature >100.4   Complete by:  As directed    Diet - low sodium heart healthy   Complete by:  As directed    Increase activity slowly   Complete by:  As directed      Allergies as of 01/15/2019   No Known Allergies     Medication List    TAKE these medications   acetaminophen 325 MG tablet Commonly known as:  TYLENOL Take 650 mg by mouth daily as needed for moderate pain or headache.   aspirin EC 81 MG tablet Take 1 tablet (81 mg total) by mouth every evening.   CALCIUM 600+D 600-400 MG-UNIT tablet Generic drug:  Calcium Carbonate-Vitamin D Take 1 tablet by mouth 2 (two) times a week. Sunday and Wednesday evenings   docusate sodium 100 MG capsule Commonly known as:  COLACE Take 1 capsule (100 mg total) by mouth 2 (two) times daily.   metFORMIN 500 MG tablet Commonly known as:  GLUCOPHAGE TAKE 1 TABLET BY MOUTH TWO  TIMES DAILY WITH A MEAL What changed:  See the new instructions.   naproxen sodium 220 MG tablet Commonly known as:  ALEVE Take 220 mg by mouth daily as needed (pain).   ondansetron 4 MG disintegrating tablet Commonly known as:  ZOFRAN-ODT Take 1 tablet (4 mg total) by mouth every 6 (six) hours as needed for nausea.   oxyCODONE 5 MG immediate release tablet Commonly known as:  Oxy IR/ROXICODONE Take 1  tablet (5 mg total) by mouth every 4 (four) hours as needed for moderate pain.   pravastatin 40 MG tablet Commonly known as:  PRAVACHOL TAKE 1 TABLET BY MOUTH  DAILY IN THE EVENING      Follow-up Information    Derek Jack, MD Follow up on 01/18/2019.   Specialty:  Hematology Why:  January 29 at 1:40 pm with Dr. Wallace Cullens in the [facility] - 4th floor at [facility] information: [place] [address] [phone]        Virl Cagey, MD Follow up on 01/24/2019.   Specialty:  General Surgery Why:  staple removal Contact information: [address] [place] [facility]  [address] [phone]           Signed: Virl Cagey 01/16/2019, 9:55 AM

## 2019-01-15 NOTE — Progress Notes (Signed)
IV removed and discharge instructions reviewed.  To pick up prescriptions from pharmacy.  Family to drive home

## 2019-01-15 NOTE — Discharge Instructions (Signed)
Discharge Open Abdominal Surgery Instructions:  Common Complaints: Pain at the incision site is common. This will improve with time. Take your pain medications as described below. Some nausea is common and poor appetite. The main goal is to stay hydrated the first few days after surgery.   Diet/ Activity: Diet as tolerated. You have started and tolerated a diet in the hospital, and should continue to increase what you are able to eat.   You may not have a large appetite, but it is important to stay hydrated. Drink 64 ounces of water a day. Your appetite will return with time.  Keep a dry dressing in place over your staples daily or as needed. Some minor pink/ blood tinged drainage is expected. This will stop in a few days after surgery.  Shower per your regular routine daily.  Do not take hot showers. Take warm showers that are less than 10 minutes. Path the incision dry. Wear an abdominal binder daily with activity. You do not have to wear this while sleeping or sitting.  Rest and listen to your body, but do not remain in bed all day.  Walk everyday for at least 15-20 minutes. Deep cough and move around every 1-2 hours in the first few days after surgery.  Do not lift > 10 lbs, perform excessive bending, pushing, pulling, squatting for 6-8 weeks after surgery.  The activity restrictions and the abdominal binder are to prevent hernia formation at your incision while you are healing.  Do not place lotions or balms on your incision unless instructed to specifically by Dr. Constance Haw.   Medication: Take tylenol and ibuprofen as needed for pain control, alternating every 4-6 hours.  Example:  Tylenol 1000mg  @ 6am, 12noon, 6pm, 57midnight (Do not exceed 4000mg  of tylenol a day). Ibuprofen 800mg  @ 9am, 3pm, 9pm, 3am (Do not exceed 3600mg  of ibuprofen a day).  Take Roxicodone for breakthrough pain every 4 hours.  Take Colace for constipation related to narcotic pain medication. If you do not have a  bowel movement in 2 days, take Miralax over the counter.  Drink plenty of water to also prevent constipation.   Contact Information: If you have questions or concerns, please call our office, [phone], Monday- Thursday 8AM-5PM and Friday 8AM-12Noon.  If it is after hours or on the weekend, please call [facility] Main Number, [phone], and ask to speak to the surgeon on call for Dr. Constance Haw at [facility].    Laparoscopic Colectomy, Care After This sheet gives you information about how to care for yourself after your procedure. Your health care provider may also give you more specific instructions. If you have problems or questions, contact your health care provider. What can I expect after the procedure? After your procedure, it is common to have the following:  Pain in your abdomen, especially in the incision areas. You will be given medicine to control the pain.  Tiredness. This is a normal part of the recovery process. Your energy level will return to normal over the next several weeks.  Changes in your bowel movements, such as constipation or needing to go more often. Talk with your health care provider about how to manage this. Follow these instructions at home: Medicines  Take over-the-counter and prescription medicines only as told by your health care provider.  Do not drive or use heavy machinery while taking prescription pain medicine.  Do not drink alcohol while taking prescription pain medicine.  If you were prescribed an antibiotic medicine, use it as  told by your health care provider. Do not stop using the antibiotic even if you start to feel better. Incision care   Follow instructions from your health care provider about how to take care of your incision areas. Make sure you: ? Keep your incisions clean and dry. ? Wash your hands with soap and water before and after applying medicine to the areas, and before and after changing your bandage (dressing). If soap and  water are not available, use hand sanitizer. ? Change your dressing as told by your health care provider.  Leave staples in place. These skin closures may need to stay in place for 2 weeks or longer.  Do not wear tight clothing over the incisions. Tight clothing may rub and irritate the incision areas, which may cause the incisions to open.  Do not take baths, swim, or use a hot tub until your health care provider approves.  You May shower.  Check your incision area every day for signs of infection. Check for: ? More redness, swelling, or pain. ? More fluid or blood. ? Warmth. ? Pus or a bad smell. Activity  Avoid lifting anything that is heavier than 10 lb (4.5 kg) for 2 weeks or until your health care provider says it is okay.  You may resume normal activities as told by your health care provider. Ask your health care provider what activities are safe for you.  Take rest breaks during the day as needed. Eating and drinking  Follow instructions from your health care provider about what you can eat after surgery.  To prevent or treat constipation while you are taking prescription pain medicine, your health care provider may recommend that you: ? Drink enough fluid to keep your urine clear or pale yellow. ? Take over-the-counter or prescription medicines. ? Eat foods that are high in fiber, such as fresh fruits and vegetables, whole grains, and beans. ? Limit foods that are high in fat and processed sugars, such as fried and sweet foods. General instructions  Ask your health care provider when you will need an appointment to get your sutures or staples removed.  Keep all follow-up visits as told by your health care provider. This is important. Contact a health care provider if:  You have more redness, swelling, or pain around your incisions.  You have more fluid or blood coming from the incisions.  Your incisions feel warm to the touch.  You have pus or a bad smell coming  from your incisions or your dressing.  You have a fever.  You have an incision that breaks open (edges not staying together) after sutures or staples have been removed. Get help right away if:  You develop a rash.  You have chest pain or difficulty breathing.  You have pain or swelling in your legs.  You feel light-headed or you faint.  Your abdomen swells (becomes distended).  You have nausea or vomiting.  You have blood in your stool (feces). This information is not intended to replace advice given to you by your health care provider. Make sure you discuss any questions you have with your health care provider. Document Released: 06/26/2005 Document Revised: 08/26/2018 Document Reviewed: 09/07/2016 Elsevier Interactive Patient Education  2019 [employer].

## 2019-01-15 NOTE — Progress Notes (Signed)
Ate half piece of toast and drank two juices and half cup water.  Denies pain and nausea just says she feels full

## 2019-01-16 ENCOUNTER — Telehealth: Payer: Self-pay

## 2019-01-16 NOTE — Telephone Encounter (Signed)
Transition Care Management Follow-up Telephone Call   Date discharged?     01-16-19          How have you been since you were released from the hospital? I am doing ok    Do you understand why you were in the hospital? Yes I do   Do you understand the discharge instructions?Yes    Where were you discharged to? Home    Items Reviewed:  Medications reviewed: Yes   Allergies reviewed: Yes   Dietary changes reviewed: low sodium heart healthy  Referrals reviewed: Yes    Functional Questionnaire:   Activities of Daily Living (ADLs):   Able to perform    Any transportation issues/concerns?: Sister is having to drive her now, until she is cleared to drive    Any patient concerns? Not right now, I think I have it, as best as I can"   Confirmed importance and date/time of follow-up visits scheduled Patient unable to make an appointment for Follow up at this time, due to not knowing sister's schedule, stated that she would call us back    Confirmed with patient if condition begins to worsen call PCP or go to the ER.  Patient was given the office number and encouraged to call back with question or concerns.  :

## 2019-01-18 ENCOUNTER — Encounter (HOSPITAL_COMMUNITY): Payer: Self-pay | Admitting: Hematology

## 2019-01-18 ENCOUNTER — Other Ambulatory Visit: Payer: Self-pay

## 2019-01-18 ENCOUNTER — Inpatient Hospital Stay (HOSPITAL_COMMUNITY): Payer: 59 | Attending: Hematology | Admitting: Hematology

## 2019-01-18 ENCOUNTER — Inpatient Hospital Stay (HOSPITAL_COMMUNITY): Payer: 59

## 2019-01-18 DIAGNOSIS — Z7982 Long term (current) use of aspirin: Secondary | ICD-10-CM | POA: Insufficient documentation

## 2019-01-18 DIAGNOSIS — F17210 Nicotine dependence, cigarettes, uncomplicated: Secondary | ICD-10-CM | POA: Diagnosis not present

## 2019-01-18 DIAGNOSIS — E119 Type 2 diabetes mellitus without complications: Secondary | ICD-10-CM | POA: Insufficient documentation

## 2019-01-18 DIAGNOSIS — Z8249 Family history of ischemic heart disease and other diseases of the circulatory system: Secondary | ICD-10-CM | POA: Insufficient documentation

## 2019-01-18 DIAGNOSIS — Z801 Family history of malignant neoplasm of trachea, bronchus and lung: Secondary | ICD-10-CM | POA: Diagnosis not present

## 2019-01-18 DIAGNOSIS — C184 Malignant neoplasm of transverse colon: Secondary | ICD-10-CM | POA: Insufficient documentation

## 2019-01-18 DIAGNOSIS — Z79899 Other long term (current) drug therapy: Secondary | ICD-10-CM | POA: Diagnosis not present

## 2019-01-18 DIAGNOSIS — D7589 Other specified diseases of blood and blood-forming organs: Secondary | ICD-10-CM | POA: Diagnosis not present

## 2019-01-18 DIAGNOSIS — Z7984 Long term (current) use of oral hypoglycemic drugs: Secondary | ICD-10-CM | POA: Insufficient documentation

## 2019-01-18 LAB — FOLATE: Folate: 15.6 ng/mL (ref >5.9)

## 2019-01-18 LAB — VITAMIN B12: Vitamin B-12: 743 pg/mL (ref 180–914)

## 2019-01-18 LAB — TSH: TSH: 2.297 u[IU]/mL (ref 0.350–4.500)

## 2019-01-18 NOTE — Assessment & Plan Note (Addendum)
1.  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  Biopsy was consistent with invasive poorly differentiated adenocarcinoma.  Small polyp was found in the proximal sigmoid colon which was sessile.  External hemorrhoids were present. -CT of the abdomen and pelvis on 12/16/2018 showing a 4.2 x 2.9 cm near circumferential apple core type mass involving the proximal transverse colon with a few small lymph nodes noted in the pericolonic fat.  No retroperitoneal adenopathy or evidence of liver mets. -Laparoscopic right colectomy on 01/09/2019. -Pathology showed 4 cm moderately differentiated invasive adenocarcinoma, grade 2, 1/25 lymph nodes positive, no tumor deposits, no perineural invasion.  Tumor extends through serosa into adherent omentum.  Margins are negative. - Mismatch repair protein IHC shows loss of nuclear expression of MLH1. -We will await MSI test results.  Patient will need BRAF mutation testing if MSI is high. - Will obtain a whole-body PET CT scan for staging.  We will also send a CEA level. - If there is no metastatic disease, she will require adjuvant chemotherapy with FOLFOX-based regimen for 6 months. -Had a prolonged discussion about adjuvant chemotherapy, its rationale, administration and its side effects.  She and her sister have posed lots of appropriate questions.  They were answered to their satisfaction. -We will see her back after PET CT scan.  2.  Macrocytosis: -She has macrocytosis on recent CBC with MCV of 106. -We will check her folic acid, P95 and TSH levels.

## 2019-01-18 NOTE — Patient Instructions (Signed)
[facility] at [facility] Discharge Instructions     Thank you for choosing [facility] at [facility] to provide your oncology and hematology care.  To afford each patient quality time with our provider, please arrive at least 15 minutes before your scheduled appointment time.   If you have a lab appointment with the Cancer Center please come in thru the  Main Entrance and check in at the main information desk  You need to re-schedule your appointment should you arrive 10 or more minutes late.  We strive to give you quality time with our providers, and arriving late affects you and other patients whose appointments are after yours.  Also, if you no show three or more times for appointments you may be dismissed from the clinic at the providers discretion.     Again, thank you for choosing [facility].  Our hope is that these requests will decrease the amount of time that you wait before being seen by our physicians.       _____________________________________________________________  Should you have questions after your visit to [facility], please contact our office at [phone] between the hours of 8:00 a.m. and 4:30 p.m.  Voicemails left after 4:00 p.m. will not be returned until the following business day.  For prescription refill requests, have your pharmacy contact our office and allow 72 hours.    Cancer Center Support Programs:   > Cancer Support Group  2nd Tuesday of the month 1pm-2pm, Journey Room    

## 2019-01-18 NOTE — Progress Notes (Signed)
[facility] NOTE  Patient Care Team: Fayrene Helper, MD as PCP - General (Family Medicine)  CHIEF COMPLAINTS/PURPOSE OF CONSULTATION: Stage 3 colon cancer  HISTORY OF PRESENTING ILLNESS:  Andrea Simon 63 y.o. female is here because of stage 3 colon cancer post surgical resection on 01/09/2019. She had her first colonoscopy on 12/07/18 where they removed a polyp and found her tumor. She is recovering well from her surgery and her staples are intact. She has her surgical follow up on 01/24/2019. She still has mild pain in her abdomen. Her bowel movements are loose and she has multiple a day. She denies any blood in her stool. She has noticed she has been slightly lightheaded over the last day. Denies any nausea, vomiting, or diarrhea. Denies any new pains. Had not noticed any recent bleeding such as epistaxis, hematuria or hematochezia. Denies recent chest pain on exertion, shortness of breath on minimal exertion, pre-syncopal episodes, or palpitations. Denies any numbness or tingling in hands or feet. Denies any recent fevers, infections, or recent hospitalizations. Patient reports appetite at 25% and energy level at 50%. She reports she smokes a half a pack per day since she was 63 years old.  She lives at home with family and performs all her own ADLs and activities.  She has a family history of mother had lung cancer and her maternal aunt had lung cancer both were life long smokers.   MEDICAL HISTORY:  Past Medical History:  Diagnosis Date  . Hyperlipidemia   . Multinodular goiter   . Prediabetes     SURGICAL HISTORY: Past Surgical History:  Procedure Laterality Date  . BIOPSY  12/07/2018   Procedure: BIOPSY;  Surgeon: Rogene Houston, MD;  Location: [facility];  Service: Endoscopy;;  sigmoid colon  . COLON RESECTION N/A 01/09/2019   Procedure: LAPAROSCOPIC RIGHT HEMICOLECTOMY;  Surgeon: Virl Cagey, MD;  Location: [facility] ORS;  Service: General;   Laterality: N/A;  . COLONOSCOPY N/A 12/07/2018   Procedure: COLONOSCOPY;  Surgeon: Rogene Houston, MD;  Location: [facility];  Service: Endoscopy;  Laterality: N/A;  12:45  . DILATION AND CURETTAGE OF UTERUS  2006  . FNA thyroid  2011 and 2019   benign  . POLYPECTOMY  12/07/2018   Procedure: POLYPECTOMY;  Surgeon: Rogene Houston, MD;  Location: [facility];  Service: Endoscopy;;  splenic flexure (CS x1)  . TUBAL LIGATION  1992    SOCIAL HISTORY: Social History   Socioeconomic History  . Marital status: Married    Spouse name: Not on file  . Number of children: 1  . Years of education: Not on file  . Highest education level: Not on file  Occupational History  . Occupation: [employer]  . Financial resource strain: Not on file  . Food insecurity:    Worry: Not on file    Inability: Not on file  . Transportation needs:    Medical: Not on file    Non-medical: Not on file  Tobacco Use  . Smoking status: Current Every Day Smoker    Packs/day: 0.75    Years: 45.00    Pack years: 33.75    Types: Cigarettes  . Smokeless tobacco: Never Used  Substance and Sexual Activity  . Alcohol use: Yes    Alcohol/week: 0.0 standard drinks    Comment: occasionally  . Drug use: No  . Sexual activity: Yes  Lifestyle  . Physical activity:  Days per week: Not on file    Minutes per session: Not on file  . Stress: Not on file  Relationships  . Social connections:    Talks on phone: Not on file    Gets together: Not on file    Attends religious service: Not on file    Active member of club or organization: Not on file    Attends meetings of clubs or organizations: Not on file    Relationship status: Not on file  . Intimate partner violence:    Fear of current or ex partner: Not on file    Emotionally abused: Not on file    Physically abused: Not on file    Forced sexual activity: Not on file  Other Topics Concern  . Not on file  Social  History Narrative  . Not on file    FAMILY HISTORY: Family History  Problem Relation Age of Onset  . Hypertension Mother        AAA  . Coronary artery disease Mother   . Hyperlipidemia Mother   . Cancer Mother        lung  . Hypertension Father   . Cancer Brother     ALLERGIES:  has No Known Allergies.  MEDICATIONS:  Current Outpatient Medications  Medication Sig Dispense Refill  . aspirin EC 81 MG tablet Take 1 tablet (81 mg total) by mouth every evening.    . Calcium Carbonate-Vitamin D (CALCIUM 600+D) 600-400 MG-UNIT tablet Take 1 tablet by mouth 2 (two) times a week. Sunday and Wednesday evenings    . metFORMIN (GLUCOPHAGE) 500 MG tablet TAKE 1 TABLET BY MOUTH TWO  TIMES DAILY WITH A MEAL (Patient taking differently: Take 500 mg by mouth 2 (two) times daily with a meal. ) 180 tablet 2  . naproxen sodium (ALEVE) 220 MG tablet Take 220 mg by mouth daily as needed (pain).    . pravastatin (PRAVACHOL) 40 MG tablet TAKE 1 TABLET BY MOUTH  DAILY IN THE EVENING (Patient taking differently: Take 40 mg by mouth every evening. ) 90 tablet 0  . acetaminophen (TYLENOL) 325 MG tablet Take 650 mg by mouth daily as needed for moderate pain or headache.    . docusate sodium (COLACE) 100 MG capsule Take 1 capsule (100 mg total) by mouth 2 (two) times daily. (Patient not taking: Reported on 01/18/2019) 60 capsule 1  . ondansetron (ZOFRAN-ODT) 4 MG disintegrating tablet Take 1 tablet (4 mg total) by mouth every 6 (six) hours as needed for nausea. (Patient not taking: Reported on 01/18/2019) 20 tablet 0  . oxyCODONE (OXY IR/ROXICODONE) 5 MG immediate release tablet Take 1 tablet (5 mg total) by mouth every 4 (four) hours as needed for moderate pain. (Patient not taking: Reported on 01/18/2019) 30 tablet 0   No current facility-administered medications for this visit.     REVIEW OF SYSTEMS:   Constitutional: Denies fevers, chills or abnormal night sweats Eyes: Denies blurriness of vision, double  vision or watery eyes Ears, nose, mouth, throat, and face: Denies mucositis or sore throat Respiratory: Denies cough, dyspnea or wheezes Cardiovascular: Denies palpitation, chest discomfort or lower extremity swelling Gastrointestinal:  Denies nausea, heartburn or change in bowel habits Skin: Denies abnormal skin rashes Lymphatics: Denies new lymphadenopathy or easy bruising Neurological:Denies numbness, tingling or new weaknesses Behavioral/Psych: Mood is stable, no new changes  All other systems were reviewed with the patient and are negative.  PHYSICAL EXAMINATION: ECOG PERFORMANCE STATUS: 1 - Symptomatic but completely ambulatory  Vitals:   01/18/19 1300  BP: 121/62  Pulse: 67  Resp: 16  Temp: 98.4 F (36.9 C)  SpO2: 100%   Filed Weights   01/18/19 1300  Weight: 125 lb 8 oz (56.9 kg)    GENERAL:alert, no distress and comfortable SKIN: skin color, texture, turgor are normal, no rashes or significant lesions EYES: normal, conjunctiva are pink and non-injected, sclera clear OROPHARYNX:no exudate, no erythema and lips, buccal mucosa, and tongue normal  NECK: supple, thyroid normal size, non-tender, without nodularity LYMPH:  no palpable lymphadenopathy in the cervical, axillary or inguinal LUNGS: clear to auscultation and percussion with normal breathing effort HEART: regular rate & rhythm and no murmurs and no lower extremity edema ABDOMEN: Staples in place in the supraumbilical area.  No active bleeding. Musculoskeletal:no cyanosis of digits and no clubbing  PSYCH: alert & oriented x 3 with fluent speech NEURO: no focal motor/sensory deficits  LABORATORY DATA:  I have reviewed the data as listed Lab Results  Component Value Date   WBC 5.3 01/12/2019   HGB 11.0 (L) 01/12/2019   HCT 32.8 (L) 01/12/2019   MCV 106.8 (H) 01/12/2019   PLT 198 01/12/2019     Chemistry      Component Value Date/Time   NA 140 01/12/2019 0547   NA 142 08/18/2018 0758   K 3.6 01/12/2019  0547   CL 106 01/12/2019 0547   CO2 25 01/12/2019 0547   BUN 8 01/12/2019 0547   BUN 10 08/18/2018 0758   CREATININE 0.80 01/12/2019 0547   CREATININE 0.86 08/15/2016 0943      Component Value Date/Time   CALCIUM 8.9 01/12/2019 0547   ALKPHOS 62 01/04/2019 1454   AST 22 01/04/2019 1454   ALT 16 01/04/2019 1454   BILITOT 1.1 01/04/2019 1454   BILITOT 0.7 08/18/2018 0758       RADIOGRAPHIC STUDIES: I have personally reviewed the radiological images as listed and agreed with the findings in the report. Dg Abd 1 View  Result Date: 01/11/2019 CLINICAL DATA:  Nausea and vomiting, status post right hemicolectomy 2 days prior EXAM: ABDOMEN - 1 VIEW COMPARISON:  12/16/2018 CT abdomen/pelvis FINDINGS: Vertical skin staples are noted to the right of the lumbar spine and overlying the left lower quadrant and deep pelvis just to the right of midline. There is mild dilatation of small and large bowel loops throughout the abdomen. No evidence of pneumatosis or pneumoperitoneum. No radiopaque nephrolithiasis. IMPRESSION: Mild dilatation of small and large bowel loops throughout the abdomen, compatible with mild postoperative adynamic ileus. Electronically Signed   By: Ilona Sorrel M.D.   On: 01/11/2019 10:45   Dg Chest Port 1 View  Result Date: 01/11/2019 CLINICAL DATA:  Inpatient NG tube placement EXAM: PORTABLE CHEST 1 VIEW FINDINGS: NG tube extends into stomach. The tip is the gastric antrum. Lungs are clear. IMPRESSION: NG tube extends into the stomach. Electronically Signed   By: Suzy Bouchard M.D.   On: 01/11/2019 14:50    ASSESSMENT & PLAN:  Malignant neoplasm of transverse colon ([place]) 1.  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  Biopsy was consistent with invasive poorly differentiated adenocarcinoma.  Small polyp was found in the proximal sigmoid  colon which was sessile.  External hemorrhoids were present. -CT of the abdomen and pelvis on 12/16/2018 showing a 4.2 x 2.9 cm near circumferential apple core type mass involving the proximal  transverse colon with a few small lymph nodes noted in the pericolonic fat.  No retroperitoneal adenopathy or evidence of liver mets. -Laparoscopic right colectomy on 01/09/2019. -Pathology showed 4 cm moderately differentiated invasive adenocarcinoma, grade 2, 1/25 lymph nodes positive, no tumor deposits, no perineural invasion.  Tumor extends through serosa into adherent omentum.  Margins are negative. - Mismatch repair protein IHC shows loss of nuclear expression of MLH1. -We will await MSI test results.  Patient will need BRAF mutation testing if MSI is high. - Will obtain a whole-body PET CT scan for staging.  We will also send a CEA level. - If there is no metastatic disease, she will require adjuvant chemotherapy with FOLFOX-based regimen for 6 months. -Had a prolonged discussion about adjuvant chemotherapy, its rationale, administration and its side effects.  She and her sister have posed lots of appropriate questions.  They were answered to their satisfaction. -We will see her back after PET CT scan.  2.  Macrocytosis: -She has macrocytosis on recent CBC with MCV of 106. -We will check her folic acid, C38 and TSH levels.  Orders Placed This Encounter  Procedures  . NM PET Image Initial (PI) Skull Base To Thigh    Standing Status:   Future    Standing Expiration Date:   01/18/2020    Order Specific Question:   ** REASON FOR EXAM (FREE TEXT)    Answer:   colon cancer stage 3    Order Specific Question:   If indicated for the ordered procedure, I authorize the administration of a radiopharmaceutical per Radiology protocol    Answer:   Yes    Order Specific Question:   Preferred imaging location?    Answer:   [facility]    Order Specific Question:   Radiology Contrast Protocol - do NOT  remove file path    Answer:   [email or web address]  . CEA    Standing Status:   Future    Number of Occurrences:   1    Standing Expiration Date:   01/18/2020  . Vitamin B12    Standing Status:   Future    Number of Occurrences:   1    Standing Expiration Date:   01/18/2020  . Folate    Standing Status:   Future    Number of Occurrences:   1    Standing Expiration Date:   01/18/2020  . TSH    Standing Status:   Future    Number of Occurrences:   1    Standing Expiration Date:   01/18/2020    All questions were answered. The patient knows to call the clinic with any problems, questions or concerns.      Derek Jack, MD 01/18/2019 4:50 PM

## 2019-01-19 LAB — CEA: CEA: 5.1 ng/mL — ABNORMAL HIGH (ref 0.0–4.7)

## 2019-01-24 ENCOUNTER — Ambulatory Visit (INDEPENDENT_AMBULATORY_CARE_PROVIDER_SITE_OTHER): Payer: Self-pay | Admitting: General Surgery

## 2019-01-24 ENCOUNTER — Encounter: Payer: Self-pay | Admitting: General Surgery

## 2019-01-24 ENCOUNTER — Ambulatory Visit (INDEPENDENT_AMBULATORY_CARE_PROVIDER_SITE_OTHER): Payer: 59 | Admitting: Family Medicine

## 2019-01-24 ENCOUNTER — Encounter: Payer: Self-pay | Admitting: Family Medicine

## 2019-01-24 VITALS — BP 134/60 | HR 73 | Resp 12 | Ht 63.0 in | Wt 124.0 lb

## 2019-01-24 VITALS — BP 141/70 | HR 73 | Temp 97.3°F | Resp 16 | Wt 124.0 lb

## 2019-01-24 DIAGNOSIS — E782 Mixed hyperlipidemia: Secondary | ICD-10-CM | POA: Diagnosis not present

## 2019-01-24 DIAGNOSIS — Z09 Encounter for follow-up examination after completed treatment for conditions other than malignant neoplasm: Secondary | ICD-10-CM

## 2019-01-24 DIAGNOSIS — F5105 Insomnia due to other mental disorder: Secondary | ICD-10-CM

## 2019-01-24 DIAGNOSIS — E1169 Type 2 diabetes mellitus with other specified complication: Secondary | ICD-10-CM | POA: Diagnosis not present

## 2019-01-24 DIAGNOSIS — F409 Phobic anxiety disorder, unspecified: Secondary | ICD-10-CM

## 2019-01-24 DIAGNOSIS — C184 Malignant neoplasm of transverse colon: Secondary | ICD-10-CM

## 2019-01-24 LAB — GLUCOSE, CAPILLARY
Glucose-Capillary: 104 mg/dL — ABNORMAL HIGH (ref 70–99)
Glucose-Capillary: 158 mg/dL — ABNORMAL HIGH (ref 70–99)
Glucose-Capillary: 96 mg/dL (ref 70–99)
Glucose-Capillary: 99 mg/dL (ref 70–99)
Glucose-Capillary: 83 mg/dL (ref 70–99)
Glucose-Capillary: 73 mg/dL (ref 70–99)
Glucose-Capillary: 60 mg/dL — ABNORMAL LOW (ref 70–99)
Glucose-Capillary: 107 mg/dL — ABNORMAL HIGH (ref 70–99)

## 2019-01-24 MED ORDER — PANTOPRAZOLE SODIUM 40 MG PO TBEC: 40.0000 mg | DELAYED_RELEASE_TABLET | Freq: Every day | ORAL | 1 refills | Status: DC

## 2019-01-24 MED ORDER — ALPRAZOLAM 0.25 MG PO TABS: ORAL_TABLET | ORAL | 0 refills | Status: DC

## 2019-01-24 NOTE — Patient Instructions (Signed)
No heavy lifting > 10 lbs, no excessive bending, squatting, push or pulling for 6-8 weeks post op. Diet as tolerated. Keep stools soft.   Implanted [facility] Guide An implanted port is a device that is placed under the skin. It is usually placed in the chest. The device can be used to give IV medicine, to take blood, or for dialysis. You may have an implanted port if:  You need IV medicine that would be irritating to the small veins in your hands or arms.  You need IV medicines, such as antibiotics, for a long period of time.  You need IV nutrition for a long period of time.  You need dialysis. Having a port means that your health care provider will not need to use the veins in your arms for these procedures. You may have fewer limitations when using a port than you would if you used other types of long-term IVs, and you will likely be able to return to normal activities after your incision heals. An implanted port has two main parts:  Reservoir. The reservoir is the part where a needle is inserted to give medicines or draw blood. The reservoir is round. After it is placed, it appears as a small, raised area under your skin.  Catheter. The catheter is a thin, flexible tube that connects the reservoir to a vein. Medicine that is inserted into the reservoir goes into the catheter and then into the vein. How is my port accessed? To access your port:  A numbing cream may be placed on the skin over the port site.  Your health care provider will put on a mask and sterile gloves.  The skin over your port will be cleaned carefully with a germ-killing soap and allowed to dry.  Your health care provider will gently pinch the port and insert a needle into it.  Your health care provider will check for a blood return to make sure the port is in the vein and is not clogged.  If your port needs to remain accessed to get medicine continuously (constant infusion), your health care provider will  place a clear bandage (dressing) over the needle site. The dressing and needle will need to be changed every week, or as told by your health care provider. What is flushing? Flushing helps keep the port from getting clogged. Follow instructions from your health care provider about how and when to flush the port. Ports are usually flushed with saline solution or a medicine called heparin. The need for flushing will depend on how the port is used:  If the port is only used from time to time to give medicines or draw blood, the port may need to be flushed: ? Before and after medicines have been given. ? Before and after blood has been drawn. ? As part of routine maintenance. Flushing may be recommended every 4-6 weeks.  If a constant infusion is running, the port may not need to be flushed.  Throw away any syringes in a disposal container that is meant for sharp items (sharps container). You can buy a sharps container from a pharmacy, or you can make one by using an empty hard plastic bottle with a cover. How long will my port stay implanted? The port can stay in for as long as your health care provider thinks it is needed. When it is time for the port to come out, a surgery will be done to remove it. The surgery will be similar to the  procedure that was done to put the port in. Follow these instructions at home:   Flush your port as told by your health care provider.  If you need an infusion over several days, follow instructions from your health care provider about how to take care of your port site. Make sure you: ? Wash your hands with soap and water before you change your dressing. If soap and water are not available, use alcohol-based hand sanitizer. ? Change your dressing as told by your health care provider. ? Place any used dressings or infusion bags into a plastic bag. Throw that bag in the trash. ? Keep the dressing that covers the needle clean and dry. Do not get it wet. ? Do not use  scissors or sharp objects near the tube. ? Keep the tube clamped, unless it is being used.  Check your port site every day for signs of infection. Check for: ? Redness, swelling, or pain. ? Fluid or blood. ? Pus or a bad smell.  Protect the skin around the port site. ? Avoid wearing bra straps that rub or irritate the site. ? Protect the skin around your port from seat belts. Place a soft pad over your chest if needed.  Bathe or shower as told by your health care provider. The site may get wet as long as you are not actively receiving an infusion.  Return to your normal activities as told by your health care provider. Ask your health care provider what activities are safe for you.  Carry a medical alert card or wear a medical alert bracelet at all times. This will let health care providers know that you have an implanted port in case of an emergency. Get help right away if:  You have redness, swelling, or pain at the port site.  You have fluid or blood coming from your port site.  You have pus or a bad smell coming from the port site.  You have a fever. Summary  Implanted ports are usually placed in the chest for long-term IV access.  Follow instructions from your health care provider about flushing the port and changing bandages (dressings).  Take care of the area around your port by avoiding clothing that puts pressure on the area, and by watching for signs of infection.  Protect the skin around your port from seat belts. Place a soft pad over your chest if needed.  Get help right away if you have a fever or you have redness, swelling, pain, drainage, or a bad smell at the port site. This information is not intended to replace advice given to you by your health care provider. Make sure you discuss any questions you have with your health care provider. Document Released: 12/07/2005 Document Revised: 01/09/2017 Document Reviewed: 01/09/2017 Elsevier Interactive Patient  Education  2019 [employer].

## 2019-01-24 NOTE — Patient Instructions (Addendum)
F/U as before , call if you need, me before  Stop metformin , we will give you testuppplies for once or twice weekly testing every morning before breakfast, normal range is 80 to 120.  Reduce pravstatin to 4 times per week  PLEASE try to eat as often as you can, healthy food choices and let me know iif there is anything that you need  Xanax , for short term use only to help with sleep and anxiety is prescribed, may break in half if whole tablet is too strong   Our heartfelt condolence  Re your recent losses. And we are here with you as you go  through your health challenges

## 2019-01-24 NOTE — Progress Notes (Signed)
[facility] Note   HPI:  63 y.o. Female presents to clinic for post-op follow-up evaluation after her laparoscopic right hemicolectomy for cancer. She has been seen by Dr. Delton Coombes and he recommends chemotherapy given the lymph nodes and the molecular markers.  She is considering this but wants to get her PET scan first. We discussed the rationale behind the chemotherapy. She reports she is doing fair and eating but feels bloated and has indigestion at times. She has a daily BM to twice daily and it is remaining soft. She feels like her appetite is starting to come back. She is not having to use much pain medication.   She seems a little down and her affect is depressed. We discussed the overwhelming situation and the aspect of grief she is experiencing.   Review of Systems:  No fever or chills No drainage from wound Daily Bms  All other review of systems: otherwise negative   Vital Signs:  BP (!) 141/70 (BP Location: Left Arm, Patient Position: Sitting, Cuff Size: Normal)   Pulse 73   Temp (!) 97.3 F (36.3 C) (Temporal)   Resp 16   Wt 124 lb (56.2 kg)   BMI 21.97 kg/m    Physical Exam:  Physical Exam Vitals signs reviewed.  HENT:     Head: Normocephalic.     Nose: Nose normal.     Mouth/Throat:     Mouth: Mucous membranes are moist.  Eyes:     Pupils: Pupils are equal, round, and reactive to light.  Neck:     Musculoskeletal: Normal range of motion.  Cardiovascular:     Rate and Rhythm: Normal rate.  Pulmonary:     Effort: Pulmonary effort is normal.  Abdominal:     General: There is no distension.     Palpations: Abdomen is soft.     Tenderness: There is no abdominal tenderness.     Comments: Staple removed from incision and port site, wound healing, steristrips applied, no erythema or drainage  Musculoskeletal: Normal range of motion.        General: No swelling.  Skin:    General: Skin is warm and dry.  Neurological:     Mental Status: She is  alert.  Psychiatric:        Attention and Perception: Attention normal.        Mood and Affect: Mood is depressed.        Speech: Speech normal.        Behavior: Behavior normal.        Thought Content: Thought content normal.     Assessment:  63 y.o. yo Female with colon cancer. Doing well surgically but emotionally seems a little depressed.  She is contemplating chemotherapy.  We discussed port a catheter placement pending her deciding to pursue the chemotherapy. We discussed the risk and benefits of the port including but not limited to bleeding, infection, malfunction and pneumothorax.  She will decide how she wants to proceed. She is asking about going back to work. At this time she does not feel ready due to her energy level and pain..  Given that she is unsure about the chemotherapy, I recommended for her to follow back up with me, and I can clear her back in a few weeks if feeling better and not pursuing chemotherapy. If she is pursuing chemotherapy will hold the discussion of returning to work to Dr. Delton Coombes.   Plan:  - Follow up in a few weeks after PET  and decision for chemotherapy   - If needs port can place in the upcoming weeks  - Protonix Rx written for indigestion  - No heavy lifting > 10 lbs, pulling, pushing, squatting, or excessive bending for 6-8 weeks post op.   Future Appointments  Date Time Provider [place]  01/31/2019 12:00 PM [facility] PET CT 1 [facility] [place]  02/02/2019  9:15 AM Derek Jack, MD AP-ACAPA None  02/07/2019  9:00 AM Virl Cagey, MD RS-RS None  05/18/2019  3:20 PM Fayrene Helper, MD RPC-RPC RPC    All of the above recommendations were discussed with the patient and patient's family, and all of patient's and family's questions were answered to their expressed satisfaction.  Curlene Labrum, MD [facility] [address] [place], [place] [address] [phone] (office)

## 2019-01-26 ENCOUNTER — Ambulatory Visit (HOSPITAL_COMMUNITY): Payer: 59

## 2019-01-27 ENCOUNTER — Encounter: Payer: Self-pay | Admitting: Family Medicine

## 2019-01-27 DIAGNOSIS — F5105 Insomnia due to other mental disorder: Secondary | ICD-10-CM | POA: Insufficient documentation

## 2019-01-27 DIAGNOSIS — Z09 Encounter for follow-up examination after completed treatment for conditions other than malignant neoplasm: Secondary | ICD-10-CM | POA: Insufficient documentation

## 2019-01-27 DIAGNOSIS — F409 Phobic anxiety disorder, unspecified: Secondary | ICD-10-CM | POA: Insufficient documentation

## 2019-01-27 HISTORY — DX: Phobic anxiety disorder, unspecified: F40.9

## 2019-01-27 HISTORY — DX: Insomnia due to other mental disorder: F51.05

## 2019-01-27 NOTE — Assessment & Plan Note (Signed)
Patient in for follow up of recent hospitalization. Discharge summary, and laboratory and radiology data are reviewed, and any questions or concerns about recent hospitalization are discussed. Specific issues requiring follow up are specifically addressed.  

## 2019-01-27 NOTE — Progress Notes (Signed)
   Andrea Simon     MRN: [id number]      DOB: [birth year]   HPI Andrea Simon is here for follow up of recent hospitalization from 01/20 to 01/16/2019  During which Andrea Simon had  surgery  For malignant neoplasm of transverse colon for which Andrea Simon had laparoscopic right colectomy,on 42/35/3614 complicated by post op ileus which started on post op  day 2, discharged with normal bowel function and fair appetite Hospital course is reviewed and questions answered At time of d/c her BP was still noted to be elevated and Andrea Simon is here for review of medications needed at this time and hospital course. Has been evaluated by Oncology and studies are pending to determine additional treatment needed ROS Denies recent fever or chills.Tired, poor sleep, generalized weakness, currently  Grieving the passing of another Aunt to lung cancer, requests medication for sleep, has the support o f her only sister and spouse Denies sinus pressure, nasal congestion, ear pain or sore throat. Denies chest congestion, productive cough or wheezing. Denies chest pains, palpitations and leg swelling Denies dysuria, frequency, hesitancy or incontinence. Denies joint pain, swelling and limitation in mobility. Denies headaches, seizures, numbness, or tingling.  Denies skin break down or rash.   PE  BP 134/60   Pulse 73   Resp 12   Ht 5\' 3"  (1.6 m)   Wt 124 lb 0.6 oz (56.3 kg)   SpO2 96% Comment: room air  BMI 21.97 kg/m   Patient alert and oriented and in no cardiopulmonary distress.Flat affect and appears extremely fatigued  HEENT: No facial asymmetry, EOMI,   oropharynx pink and moist.  Neck supple no JVD, no mass.  Chest: Clear to auscultation bilaterally.  CVS: S1, S2 no murmurs, no S3.Regular rate.  ABD: Soft    Ext: No edema  MS: Adequate ROM spine, shoulders, hips and knees.  Skin: Intact, no ulcerations or rash noted.  Psych: Flat affect. Memory intact depressed appearing.  CNS: CN 2-12 intact, power,   normal throughout.no focal deficits noted.   [facility] discharge follow-up Patient in for follow up of recent hospitalization. Discharge summary, and laboratory and radiology data are reviewed, and any questions or concerns about recent hospitalization are discussed. Specific issues requiring follow up are specifically addressed.   Diabetes mellitus ([place]) Metformin to be discontinued as most recent HBa1C is nearly normal, and appetite not good. Andrea Simon I to monitor blood sugar at home weekly  Hyperlipemia Hyperlipidemia:Low fat diet discussed and encouraged.   Lipid Panel  Lab Results  Component Value Date   CHOL 189 08/18/2018   HDL 32 (L) 08/18/2018   LDLCALC 112 (H) 08/18/2018   TRIG 224 (H) 08/18/2018   CHOLHDL 5.9 (H) 08/18/2018   Reduce medication to 4 3 times weekly and follow low fat diet    Insomnia due to anxiety and fear Short term use of lowest dose of xanax to help with anxiety and sleep during this very challenging period, recent loss of 2  Aunts and new personal dx of colon cancer, therapy offered but no interest currently

## 2019-01-27 NOTE — Assessment & Plan Note (Signed)
Short term use of lowest dose of xanax to help with anxiety and sleep during this very challenging period, recent loss of 2  Aunts and new personal dx of colon cancer, therapy offered but no interest currently

## 2019-01-27 NOTE — Assessment & Plan Note (Signed)
Metformin to be discontinued as most recent HBa1C is nearly normal, and appetite not good. She I to monitor blood sugar at home weekly

## 2019-01-27 NOTE — Assessment & Plan Note (Signed)
Hyperlipidemia:Low fat diet discussed and encouraged.   Lipid Panel  Lab Results  Component Value Date   CHOL 189 08/18/2018   HDL 32 (L) 08/18/2018   LDLCALC 112 (H) 08/18/2018   TRIG 224 (H) 08/18/2018   CHOLHDL 5.9 (H) 08/18/2018   Reduce medication to 4 3 times weekly and follow low fat diet

## 2019-01-30 ENCOUNTER — Ambulatory Visit (HOSPITAL_COMMUNITY): Payer: 59 | Admitting: Hematology

## 2019-01-31 ENCOUNTER — Ambulatory Visit (HOSPITAL_COMMUNITY)
Admission: RE | Admit: 2019-01-31 | Discharge: 2019-01-31 | Disposition: A | Discharge status: Home / Self Care | Payer: 59 | Source: Ambulatory Visit | Attending: Nurse Practitioner | Admitting: Nurse Practitioner

## 2019-01-31 DIAGNOSIS — C184 Malignant neoplasm of transverse colon: Secondary | ICD-10-CM | POA: Insufficient documentation

## 2019-01-31 LAB — GLUCOSE, CAPILLARY: Glucose-Capillary: 97 mg/dL (ref 70–99)

## 2019-01-31 MED ORDER — FLUDEOXYGLUCOSE F - 18 (FDG) INJECTION
6.7800 | Freq: Once | INTRAVENOUS | Status: AC | PRN
  Administered 2019-01-31: 6.78 via INTRAVENOUS

## 2019-02-02 ENCOUNTER — Inpatient Hospital Stay (HOSPITAL_COMMUNITY): Payer: 59 | Attending: Hematology | Admitting: Hematology

## 2019-02-02 ENCOUNTER — Other Ambulatory Visit: Payer: Self-pay

## 2019-02-02 ENCOUNTER — Encounter (HOSPITAL_COMMUNITY): Payer: Self-pay | Admitting: *Deleted

## 2019-02-02 ENCOUNTER — Encounter (HOSPITAL_COMMUNITY): Payer: Self-pay | Admitting: Hematology

## 2019-02-02 DIAGNOSIS — Z8249 Family history of ischemic heart disease and other diseases of the circulatory system: Secondary | ICD-10-CM | POA: Diagnosis not present

## 2019-02-02 DIAGNOSIS — Z801 Family history of malignant neoplasm of trachea, bronchus and lung: Secondary | ICD-10-CM | POA: Diagnosis not present

## 2019-02-02 DIAGNOSIS — R718 Other abnormality of red blood cells: Secondary | ICD-10-CM | POA: Insufficient documentation

## 2019-02-02 DIAGNOSIS — F17210 Nicotine dependence, cigarettes, uncomplicated: Secondary | ICD-10-CM | POA: Diagnosis not present

## 2019-02-02 DIAGNOSIS — Z79899 Other long term (current) drug therapy: Secondary | ICD-10-CM | POA: Diagnosis not present

## 2019-02-02 DIAGNOSIS — C184 Malignant neoplasm of transverse colon: Secondary | ICD-10-CM | POA: Diagnosis present

## 2019-02-02 DIAGNOSIS — Z7982 Long term (current) use of aspirin: Secondary | ICD-10-CM | POA: Insufficient documentation

## 2019-02-02 DIAGNOSIS — D7589 Other specified diseases of blood and blood-forming organs: Secondary | ICD-10-CM | POA: Insufficient documentation

## 2019-02-02 MED ORDER — LIDOCAINE-PRILOCAINE 2.5-2.5 % EX CREA: TOPICAL_CREAM | CUTANEOUS | 3 refills | Status: DC

## 2019-02-02 MED ORDER — PROCHLORPERAZINE MALEATE 10 MG PO TABS: 10.0000 mg | ORAL_TABLET | Freq: Four times a day (QID) | ORAL | 1 refills | Status: DC | PRN

## 2019-02-02 NOTE — Progress Notes (Signed)
START ON PATHWAY REGIMEN - Colorectal     A cycle is every 14 days:     Oxaliplatin      Leucovorin      5-Fluorouracil      5-Fluorouracil   **Always confirm dose/schedule in your pharmacy ordering system**  Patient Characteristics: Postoperative without Neoadjuvant Therapy (Pathologic Staging), Colon, Stage III, High Risk (pT4 or pN2) Therapeutic Status: Postoperative without Neoadjuvant Therapy (Pathologic Staging) Tumor Location: Colon AJCC M Category: cM0 AJCC T Category: pT4b AJCC N Category: pN1a AJCC 8 Stage Grouping: IIIC Intent of Therapy: Curative Intent, Discussed with Patient

## 2019-02-02 NOTE — Patient Instructions (Signed)
[facility] Discharge Instructions     Thank you for choosing [facility] at [facility] to provide your oncology and hematology care.  To afford each patient quality time with our provider, please arrive at least 15 minutes before your scheduled appointment time.   If you have a lab appointment with the Cancer Center please come in thru the  Main Entrance and check in at the main information desk  You need to re-schedule your appointment should you arrive 10 or more minutes late.  We strive to give you quality time with our providers, and arriving late affects you and other patients whose appointments are after yours.  Also, if you no show three or more times for appointments you may be dismissed from the clinic at the providers discretion.     Again, thank you for choosing [facility].  Our hope is that these requests will decrease the amount of time that you wait before being seen by our physicians.       _____________________________________________________________  Should you have questions after your visit to [facility], please contact our office at [phone] between the hours of 8:00 a.m. and 4:30 p.m.  Voicemails left after 4:00 p.m. will not be returned until the following business day.  For prescription refill requests, have your pharmacy contact our office and allow 72 hours.    Cancer Center Support Programs:   > Cancer Support Group  2nd Tuesday of the month 1pm-2pm, Journey Room    

## 2019-02-02 NOTE — Patient Instructions (Addendum)
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  You will be treated every 14 days with a regimen of oxaliplatin, leucovorin, and fluorouracil (5FU) for a total of 6 months (12 cycles).  The intent of this treatment is to cure your cancer.   You will see the doctor regularly throughout treatment.  We monitor your lab work prior to every treatment. The doctor monitors your response to treatment by the way you are feeling, your blood work, and scans periodically.  There will be wait times while you are here for treatment.  It will take about 30 minutes to 1 hour for your lab work to result.  Then there will be wait times while pharmacy mixes your medications.    Medications you will receive in the clinic prior to your chemotherapy medications:  Aloxi:  ALOXI is a medicine called an "antiemetic."   ALOXI is used in adults to help prevent the  nausea and vomiting that happens with certain anti-cancer medicines (chemotherapy).  Aloxi is a long acting medication, and will remain in your system for 24-36 hours.   Dexamethasone:  This is a steroid given prior to chemotherapy to help prevent allergic reactions; it may also help prevent and control nausea and diarrhea.    Oxaliplatin (Eloxatin)  About This Drug  Oxaliplatin is used to treat cancer. It is given in the vein (IV).  It takes two hours to infuse.  Possible Side Effects  . Bone marrow suppression. This is a decrease in the number of white blood cells, red blood cells, and platelets. This may raise your risk of infection, make you tired and weak (fatigue), and raise your risk of bleeding.  . Tiredness  . Soreness of the mouth and throat. You may have red areas, white patches, or sores that hurt.  . Nausea and vomiting (throwing up)  . Diarrhea (loose bowel movements)  . Changes in your liver function  . Effects on the nerves called peripheral  neuropathy. You may feel numbness, tingling, or pain in your hands and feet, and may be worse in cold temperatures. It may be hard for you to button your clothes, open jars, or walk as usual. The effect on the nerves may get worse with more doses of the drug. These effects get better in some people after the drug is stopped but it does not get better in all people  Note: Each of the side effects above was reported in 40% or greater of patients treated with oxaliplatin. Not all possible side effects are included above.  Warnings and Precautions  . Allergic reactions, including anaphylaxis, which may be life-threatening are rare but may happen in some patients. Signs of allergic reaction to this drug may be swelling of the face, feeling like your tongue or throat are swelling, trouble breathing, rash, itching, fever, chills, feeling dizzy, and/or feeling that your heart is beating in a fast or not normal way. If this happens, do not take another dose of this drug. You should get urgent medical treatment.  . Inflammation (swelling) of the lungs, which may be life-threatening. You may have a dry cough or trouble breathing.  . Effects on the nerves (neuropathy) may resolve within 14 days, or it may persist beyond 14 days.  . Severe decrease in white blood cells when combined with the chemotherapy agents 5-fluorouracil and leucovorin. This may be life-threatening.  . Severe changes in your liver function  . Abnormal  heart beat and/or EKG, which can be life-threatening  . Rhabdomyolysis- damage to your muscles which may release proteins in your blood and affect how your kidneys work, which can be life-threatening. You may have severe muscle weakness and/or pain, or dark urine.  Important Information  . This drug may impair your ability to drive or use machinery. Talk to your doctor and/or nurse about precautions you may need to take.  . This drug may be present in the saliva, tears,  sweat, urine, stool, vomit, semen, and vaginal secretions. Talk to your doctor and/or your nurse about the necessary precautions to take during this time.  . The effects on the nerves can be aggravated by exposure to cold. Avoid cold beverages, use of ice and make sure you cover your skin and dress warmly prior to being exposed to cold temperatures while you are receiving treatment with oxaliplatin.  Treating Side Effects  . Manage tiredness by pacing your activities for the day.  . Be sure to include periods of rest between energy-draining activities.  . To decrease the risk of infection, wash your hands regularly.  . Avoid close contact with people who have a cold, the flu, or other infections. . Take your temperature as your doctor or nurse tells you, and whenever you feel like you may have a fever.  . To help decrease the risk of bleeding, use a soft toothbrush. Check with your nurse before using dental floss.  . Be very careful when using knives or tools.  . Use an electric shaver instead of a razor.  . Drink plenty of fluids (a minimum of eight glasses per day is recommended).  . Mouth care is very important. Your mouth care should consist of routine, gentle cleaning of your teeth or dentures and rinsing your mouth with a mixture of 1/2 teaspoon of salt in 8 ounces of water or 1/2 teaspoon of baking soda in 8 ounces of water. This should be done at least after each meal and at bedtime.  . If you have mouth sores, avoid mouthwash that has alcohol. Also avoid alcohol and smoking because they can bother your mouth and throat.  . To help with nausea and vomiting, eat small, frequent meals instead of three large meals a day. Choose foods and drinks that are at room temperature. Ask your nurse or doctor about other helpful tips and medicine that is available to help stop or lessen these symptoms.  . If you throw up or have loose bowel movements, you should drink more fluids  so that you do not become dehydrated (lack of water in the body from losing too much fluid).  . If you have diarrhea, eat low-fiber foods that are high in protein and calories and avoid foods that can irritate your digestive tracts or lead to cramping.  . Ask your nurse or doctor about medicine that can lessen or stop your diarrhea.  . If you have numbness and tingling in your hands and feet, be careful when cooking, walking, and handling sharp objects and hot liquids.  . Do not drink cold drinks or use ice in beverages. Drink fluids at room temperature or warmer, and drink through a straw.  . Wear gloves to touch cold objects, and wear warm clothing and cover you skin during cold weather.  Food and Drug Interactions  . There are no known interactions of oxaliplatin with food and other medications.  . This drug may interact with other medicines. Tell your doctor and pharmacist  about all the prescription and over-the-counter medicines and dietary supplements (vitamins, minerals, herbs and others) that you are taking at this time. Also, check with your doctor or pharmacist before starting any new prescription or over-the-counter medicines, or dietary supplements to make sure that there are no interactions  When to Call the Doctor  Call your doctor or nurse if you have any of these symptoms and/or any new or unusual symptoms:  . Fever of 100.4 F (38 C) or higher  . Chills  . Tiredness that interferes with your daily activities  . Feeling dizzy or lightheaded  . Easy bleeding or bruising  . Feeling that your heart is beating in a fast or not normal way (palpitations)  . Pain in your chest  . Dry cough  . Trouble breathing  . Pain in your mouth or throat that makes it hard to eat or drink  . Nausea that stops you from eating or drinking and/or is not relieved by prescribed medicines  . Throwing up more than 3 times a day  . Diarrhea, 4 times in one day or diarrhea  with lack of strength or a feeling of being dizzy  . Numbness, tingling, or pain in your hands and feet  . Signs of possible liver problems: dark urine, pale bowel movements, bad stomach pain, feeling very tired and weak, unusual itching, or yellowing of the eyes or skin  . Signs of rhabdomyolysis: decreased urine, very dark urine, muscle pain in the shoulders, thighs, or lower back; muscle weakness or trouble moving arms and legs  . Signs of allergic reaction: swelling of the face, feeling like your tongue or throat are swelling, trouble breathing, rash, itching, fever, chills, feeling dizzy, and/or feeling that your heart is beating in a fast or not normal way. If this happens, call 911 for emergency care.  . If you think you may be pregnant  Reproduction Warnings  . Pregnancy warning: This drug may have harmful effects on the unborn baby. Women of childbearing potential should use effective methods of birth control during your cancer treatment. Let your doctor know right away if you think you may be pregnant or may have impregnated your partner.  . Breastfeeding warning: It is not known if this drug passes into breast milk. For this reason, women should talk to their doctor about the risks and benefits of breastfeeding during treatment with this drug because this drug may enter the breast milk and cause harm to a breastfeeding baby.  . Fertility warning: Human fertility studies have not been done with this drug. Talk with your doctor or nurse if you plan to have children. Ask for information on sperm or egg banking.   Leucovorin Calcium  About This Drug  Leucovorin is a vitamin. It is used in combination with other cancer fighting drugs such as 5-fluorouracil and methotrexate. Leucovorin is given in the vein (IV) or by mouth (orally).  Possible Side Effects . Rash and itching  Note: Leucovorin by itself has very few side effects. Other side effects you may have can be  caused by the other drugs you are taking, such as 5-fluorouracil.  Warnings and Precautions  . Allergic reactions, including anaphylaxis are rare but may happen in some patients. Signs of allergic reaction to this drug may be swelling of the face, feeling like your tongue or throat are swelling, trouble breathing, rash, itching, fever, chills, feeling dizzy, and/or feeling that your heart is beating in a fast or not normal way. If  this happens, do not take another dose of this drug. You should get urgent medical treatment.  Food and Drug Interactions  . There are no known interactions of leucovorin with food.  . This drug may interact with other medicines. Tell your doctor and pharmacist about all the prescription and over-the-counter medicines and dietary supplements (vitamins, minerals, herbs and others) that you are taking at this time.  . Also, check with your doctor or pharmacist before starting any new prescription or over-the-counter medicines, or dietary supplements to make sure that there are no interactions.  When to Call the Doctor  Call your doctor or nurse if you have any of these symptoms and/or any new or unusual symptoms:  . A new rash or a rash that is not relieved by prescribed medicines  . Signs of allergic reaction: swelling of the face, feeling like your tongue or throat are swelling, trouble  breathing, rash, itching, fever, chills, feeling dizzy, and/or feeling that your heart is beating in a fast or not normal way. If this happens, call 911 for emergency care.  . If you think you may be pregnant  Reproduction Warnings  . Pregnancy warning: It is not known if this drug may harm an unborn child. For this reason, be sure to talk with your doctor if you are pregnant or planning to become pregnant while receiving this drug. Let your doctor know right away if you think you may be pregnant  . Breastfeeding warning: It is not known if this drug passes into  breast milk. For this reason, women should talk to their doctor about the risks and benefits of breastfeeding during treatment with this drug because this drug may enter the breast milk and cause harm to a breastfeeding baby.  . Fertility warning: Human fertility studies have not been done with this drug. Talk with your doctor or nurse if you plan to have children. Ask for information on sperm or egg banking.   5-Fluorouracil (Adrucil; 5FU)  About This Drug  Fluorouracil is used to treat cancer. It is given in the vein (IV). It is given as an IV push from a syringe and also as a continuous infusion given via an ambulatory pump (a pump you take home and wear for a specified amount of time).  Possible Side Effects  . Bone marrow suppression. This is a decrease in the number of white blood cells, red blood cells, and platelets. This may raise your risk of infection, make you tired and weak (fatigue), and raise your risk of bleeding  . Changes in the tissue of the heart and/or heart attack. Some changes may happen that can cause your heart to have less ability to pump blood.  . Blurred vision or other changes in eyesight  . Nausea and throwing up (vomiting)  . Diarrhea (loose bowel movements)  . Ulcers - sores that may cause pain or bleeding in your digestive tract, which includes your mouth, esophagus, stomach, small/large intestines and rectum  . Soreness of the mouth and throat. You may have red areas, white patches, or sores that hurt.  . Allergic reactions, including anaphylaxis are rare but may happen in some patients. Signs of allergic reaction to this drug may be swelling of the face, feeling like your tongue or throat are swelling, trouble breathing, rash, itching, fever, chills, feeling dizzy, and/or feeling that your heart is beating in a fast or not normal way. If this happens, do not take another dose of this drug. You should  get urgent medical treatment.  .  Sensitivity to light (photosensitivity). Photosensitivity means that you may become more sensitive to the sun and/or light. You may get a skin rash/reaction if you are in the sun or are exposed to sun lamps and tanning beds. Your eyes may water more, mostly in bright light.  . Changes in your nail color, nail loss and/or brittle nail  . Darkening of the skin, or changes to the color of your skin and/or veins used for infusion  . Rash, dry skin, or itching  Note: Not all possible side effects are included above.  Warnings and Precautions  . Hand-and-foot syndrome. The palms of your hands or soles of your feet may tingle, become numb, painful, swollen, or red.  . Changes in your central nervous system can happen. The central nervous system is made up of your brain and spinal cord. You could feel extreme tiredness, agitation, confusion, hallucinations (see or hear things that are not there), trouble understanding or speaking, loss of control of your bowels or bladder, eyesight changes, numbness or lack of strength to your arms, legs, face, or body, or coma. If you start to have any of these symptoms let your doctor know right away.  . Side effects of this drug may be unexpectedly severe in some patients  Note: Some of the side effects above are very rare. If you have concerns and/or questions, please discuss them with your medical team.  Important Information  . This drug may be present in the saliva, tears, sweat, urine, stool, vomit, semen, and vaginal secretions. Talk to your doctor and/or your nurse about the necessary precautions to take during this time.  Treating Side Effects  . Manage tiredness by pacing your activities for the day.  . Be sure to include periods of rest between energy-draining activities.  . To help decrease the risk of infections, wash your hands regularly.  . Avoid close contact with people who have a cold, the flu, or other infections.  . Take your  temperature as your doctor or nurse tells you, and whenever you feel like you may have a fever.  . Use a soft toothbrush. Check with your nurse before using dental floss.  . Be very careful when using knives or tools.  . Use an electric shaver instead of a razor.  . If you have a nose bleed, sit with your head tipped slightly forward. Apply pressure by lightly pinching the bridge of your nose between your thumb and forefinger. Call your doctor if you feel dizzy or faint or if the bleeding doesn't stop after 10 to 15 minutes.  . Drink plenty of fluids (a minimum of eight glasses per day is recommended).  . If you throw up or have loose bowel movements, you should drink more fluids so that you do not  become dehydrated (lack of water in the body from losing too much fluid).  . To help with nausea and vomiting, eat small, frequent meals instead of three large meals a day. Choose foods and drinks that are at room temperature. Ask your nurse or doctor about other helpful tips and medicine that is available to help, stop, or lessen these symptoms.  . If you have diarrhea, eat low-fiber foods that are high in protein and calories and avoid foods that can irritate your digestive tracts or lead to cramping.  . Ask your nurse or doctor about medicine that can lessen or stop your diarrhea.  . Mouth care is very important.  Your mouth care should consist of routine, gentle cleaning of your teeth or dentures and rinsing your mouth with a mixture of 1/2 teaspoon of salt in 8 ounces of water or 1/2 teaspoon of baking soda in 8 ounces of water. This should be done at least after each meal and at bedtime.  . If you have mouth sores, avoid mouthwash that has alcohol. Also avoid alcohol and smoking because they can bother your mouth and throat.  Marland Kitchen Keeping your nails moisturized may help with brittleness.  . To help with itching, moisturize your skin several times day.  . Use sunscreen with SPF 30  or higher when you are outdoors even for a short time. Cover up when you are out in the sun. Wear wide-brimmed hats, long-sleeved shirts, and pants. Keep your neck, chest, and back covered. Wear dark sun glasses when in the sun or bright lights.  . If you get a rash do not put anything on it unless your doctor or nurse says you may. Keep the area around the rash clean and dry. Ask your doctor for medicine if your rash bothers you.  Marland Kitchen Keeping your pain under control is important to your well-being. Please tell your doctor or nurse if you are experiencing pain.  Food and Drug Interactions  . There are no known interactions of fluorouracil with food.  . Check with your doctor or pharmacist about all other prescription medicines and over-the-counter medicines and dietary supplements (vitamins, minerals, herbs and others) you are taking before starting this medicine as there are known drug interactions with 5-fluoroucacil. Also, check with your doctor or pharmacist before starting any new prescription or over-the-counter medicines, or dietary supplements to make sure that there are no interactions.  When to Call the Doctor  Call your doctor or nurse if you have any of these symptoms and/or any new or unusual symptoms:  . Fever of 100.4 F (38 C) or higher  . Chills  . Easy bleeding or bruising  . Nose bleed that doesn't stop bleeding after 10-15 minutes  . Trouble breathing  . Feeling dizzy or lightheaded  . Feeling that your heart is beating in a fast or not normal way (palpitations)  . Chest pain or symptoms of a heart attack. Most heart attacks involve pain in the center of the chest that lasts more than a few minutes. The pain may go away and come back or it can be constant. It can feel like pressure, squeezing, fullness, or pain. Sometimes pain is felt in one or both arms, the back, neck, jaw, or stomach. If any of these symptoms last 2 minutes, call 911.  Marland Kitchen Confusion  and/or agitation  . Hallucinations  . Trouble understanding or speaking  . Loss of control of bowels or bladder  . Blurry vision or changes in your eyesight  . Headache that does not go away  . Numbness or lack of strength to your arms, legs, face, or body  . Nausea that stops you from eating or drinking and/or is not relieved by prescribed medicines  . Throwing up more than 3 times a day  . Diarrhea, 4 times in one day or diarrhea with lack of strength or a feeling of being dizzy  . Pain in your mouth or throat that makes it hard to eat or drink  . Pain along the digestive tract - especially if worse after eating  . Blood in your vomit (bright red or coffee-ground) and/or stools (bright red, or  black/tarry)  . Coughing up blood  . Tiredness that interferes with your daily activities  . Painful, red, or swollen areas on your hands or feet or around your nails  . A new rash or a rash that is not relieved by prescribed medicines  . Develop sensitivity to sunlight/light  . Numbness and/or tingling of your hands and/or feet  . Signs of allergic reaction: swelling of the face, feeling like your tongue or throat are swelling, trouble breathing, rash, itching, fever, chills, feeling dizzy, and/or feeling that your heart is beating in a fast or not normal way. If this happens, call 911 for emergency care.  . If you think you are pregnant or may have impregnated your partner  Reproduction Warnings  . Pregnancy warning: This drug may have harmful effects on the unborn baby. Women of child bearing potential should use effective methods of birth control during your cancer treatment and 3 months after treatment. Men with female partners of childbearing potential should use effective methods of birth control during your cancer treatment and for 3 months after your cancer treatment. Let your doctor know right away if you think you may be pregnant or may have impregnated  your partner.  . Breastfeeding warning: It is not known if this drug passes into breast milk. For this reason, Women should not breastfeed during treatment because this drug could enter the breast milk and cause harm to a breastfeeding baby.  . Fertility warning: In men and women both, this drug may affect your ability to have children in the future. Talk with your doctor or nurse if you plan to have children. Ask for information on sperm or egg banking.  SELF CARE ACTIVITIES WHILE ON CHEMOTHERAPY:  Hydration Increase your fluid intake 48 hours prior to treatment and drink at least 8 to 12 cups (64 ounces) of water/decaffeinated beverages per day after treatment. You can still have your cup of coffee or soda but these beverages do not count as part of your 8 to 12 cups that you need to drink daily. No alcohol intake.  Medications Continue taking your normal prescription medication as prescribed.  If you start any new herbal or new supplements please let us know first to make sure it is safe.  Mouth Care Have teeth cleaned professionally before starting treatment. Keep dentures and partial plates clean. Use soft toothbrush and do not use mouthwashes that contain alcohol. Biotene is a good mouthwash that is available at most pharmacies or may be ordered by calling [phone]. Use warm salt water gargles (1 teaspoon salt per 1 quart warm water) before and after meals and at bedtime. Or you may rinse with 2 tablespoons of three-percent hydrogen peroxide mixed in eight ounces of water. If you are still having problems with your mouth or sores in your mouth please call the clinic. If you need dental work, please let the doctor know before you go for your appointment so that we can coordinate the best possible time for you in regards to your chemo regimen. You need to also let your dentist know that you are actively taking chemo. We may need to do labs prior to your dental appointment.  Skin  Care Always use sunscreen that has not expired and with SPF (Sun Protection Factor) of 50 or higher. Wear hats to protect your head from the sun. Remember to use sunscreen on your hands, ears, face, & feet.  Use good moisturizing lotions such as udder cream, eucerin, or even Vaseline.  Some chemotherapies can cause dry skin, color changes in your skin and nails.    . Avoid long, hot showers or baths. . Use gentle, fragrance-free soaps and laundry detergent. . Use moisturizers, preferably creams or ointments rather than lotions because the thicker consistency is better at preventing skin dehydration. Apply the cream or ointment within 15 minutes of showering. Reapply moisturizer at night, and moisturize your hands every time after you wash them.  Hair Loss (if your doctor says your hair will fall out)  . If your doctor says that your hair is likely to fall out, decide before you begin chemo whether you want to wear a wig. You may want to shop before treatment to match your hair color. . Hats, turbans, and scarves can also camouflage hair loss, although some people prefer to leave their heads uncovered. If you go bare-headed outdoors, be sure to use sunscreen on your scalp. . Cut your hair short. It eases the inconvenience of shedding lots of hair, but it also can reduce the emotional impact of watching your hair fall out. . Don't perm or color your hair during chemotherapy. Those chemical treatments are already damaging to hair and can enhance hair loss. Once your chemo treatments are done and your hair has grown back, it's OK to resume dyeing or perming hair.  With chemotherapy, hair loss is almost always temporary. But when it grows back, it may be a different color or texture. In older adults who still had hair color before chemotherapy, the new growth may be completely gray.  Often, new hair is very fine and soft.  Infection Prevention Please wash your hands for at least 30 seconds using warm  soapy water. Handwashing is the #1 way to prevent the spread of germs. Stay away from sick people or people who are getting over a cold. If you develop respiratory systems such as green/yellow mucus production or productive cough or persistent cough let us know and we will see if you need an antibiotic. It is a good idea to keep a pair of gloves on when going into grocery stores/[employer] to decrease your risk of coming into contact with germs on the carts, etc. Carry alcohol hand gel with you at all times and use it frequently if out in public. If your temperature reaches 100.5 or higher please call the clinic and let us know.  If it is after hours or on the weekend please go to the ER if your temperature is over 100.5.  Please have your own personal thermometer at home to use.    Sex and bodily fluids If you are going to have sex, a condom must be used to protect the person that isn't taking chemotherapy. Chemo can decrease your libido (sex drive). For a few days after chemotherapy, chemotherapy can be excreted through your bodily fluids.  When using the toilet please close the lid and flush the toilet twice.  Do this for a few day after you have had chemotherapy.   Effects of chemotherapy on your sex life Some changes are simple and won't last long. They won't affect your sex life permanently.  Sometimes you may feel: . too tired . not strong enough to be very active . sick or sore  . not in the mood . anxious or low Your anxiety might not seem related to sex. For example, you may be worried about the cancer and how your treatment is going. Or you may be worried about money, or about how  you family are coping with your illness. These things can cause stress, which can affect your interest in sex. It's important to talk to your partner about how you feel. Remember - the changes to your sex life don't usually last long. There's usually no medical reason to stop having sex during chemo. The drugs won't  have any long term physical effects on your performance or enjoyment of sex. Cancer can't be passed on to your partner during sex  Contraception It's important to use reliable contraception during treatment. Avoid getting pregnant while you or your partner are having chemotherapy. This is because the drugs may harm the baby. Sometimes chemotherapy drugs can leave a man or woman infertile.  This means you would not be able to have children in the future. You might want to talk to someone about permanent infertility. It can be very difficult to learn that you may no longer be able to have children. Some people find counselling helpful. There might be ways to preserve your fertility, although this is easier for men than for women. You may want to speak to a fertility expert. You can talk about sperm banking or harvesting your eggs. You can also ask about other fertility options, such as donor eggs. If you have or have had breast cancer, your doctor might advise you not to take the contraceptive pill. This is because the hormones in it might affect the cancer.  It is not known for sure whether or not chemotherapy drugs can be passed on through semen or secretions from the vagina. Because of this some doctors advise people to use a barrier method if you have sex during treatment. This applies to vaginal, anal or oral sex. Generally, doctors advise a barrier method only for the time you are actually having the treatment and for about a week after your treatment. Advice like this can be worrying, but this does not mean that you have to avoid being intimate with your partner. You can still have close contact with your partner and continue to enjoy sex.  Animals If you have cats or birds we just ask that you not change the litter or change the cage.  Please have someone else do this for you while you are on chemotherapy.   Food Safety During and After Cancer Treatment Food safety is important for people both  during and after cancer treatment. Cancer and cancer treatments, such as chemotherapy, radiation therapy, and stem cell/bone marrow transplantation, often weaken the immune system. This makes it harder for your body to protect itself from foodborne illness, also called food poisoning. Foodborne illness is caused by eating food that contains harmful bacteria, parasites, or viruses.  Foods to avoid Some foods have a higher risk of becoming tainted with bacteria. These include: Marland Kitchen Unwashed fresh fruit and vegetables, especially leafy vegetables that can hide dirt and other contaminants . Raw sprouts, such as alfalfa sprouts . Raw or undercooked beef, especially ground beef, or other raw or undercooked meat and poultry . Fatty, fried, or spicy foods immediately before or after treatment.  These can sit heavy on your stomach and make you feel nauseous. . Raw or undercooked shellfish, such as oysters. . Sushi and sashimi, which often contain raw fish.  . Unpasteurized beverages, such as unpasteurized fruit juices, raw milk, raw yogurt, or cider . Undercooked eggs, such as soft boiled, over easy, and poached; raw, unpasteurized eggs; or foods made with raw egg, such as homemade raw cookie dough and homemade mayonnaise  Simple steps for food safety  Shop smart. . Do not buy food stored or displayed in an unclean area. . Do not buy bruised or damaged fruits or vegetables. . Do not buy cans that have cracks, dents, or bulges. . Pick up foods that can spoil at the end of your shopping trip and store them in a cooler on the way home.  Prepare and clean up foods carefully. . Rinse all fresh fruits and vegetables under running water, and dry them with a clean towel or paper towel. . Clean the top of cans before opening them. . After preparing food, wash your hands for 20 seconds with hot water and soap. Pay special attention to areas between fingers and under nails. . Clean your utensils and dishes with  hot water and soap. Marland Kitchen Disinfect your kitchen and cutting boards using 1 teaspoon of liquid, unscented bleach mixed into 1 quart of water.    Dispose of old food. . Eat canned and packaged food before its expiration date (the "use by" or "best before" date). . Consume refrigerated leftovers within 3 to 4 days. After that time, throw out the food. Even if the food does not smell or look spoiled, it still may be unsafe. Some bacteria, such as Listeria, can grow even on foods stored in the refrigerator if they are kept for too long.  Take precautions when eating out. . At restaurants, avoid buffets and salad bars where food sits out for a long time and comes in contact with many people. Food can become contaminated when someone with a virus, often a norovirus, or another "bug" handles it. . Put any leftover food in a "to-go" container yourself, rather than having the server do it. And, refrigerate leftovers as soon as you get home. . Choose restaurants that are clean and that are willing to prepare your food as you order it cooked.   MEDICATIONS:                                                                                                                                                                Compazine/Prochlorperazine 10mg  tablet. Take 1 tablet every 6 hours as needed for nausea/vomiting. (This can make you sleepy)   EMLA cream. Apply a quarter size amount to port site 1 hour prior to chemo. Do not rub in. Cover with plastic wrap.   Over-the-Counter Meds:  Colace - 100 mg capsules - take 2 capsules daily.  If this doesn't help then you can increase to 2 capsules twice daily.  Call us if this does not help your bowels move.   Imodium 2mg  capsule. Take 2 capsules after the 1st loose stool and then 1 capsule every 2 hours until you go a total of 12 hours without having  a loose stool. Call the [facility] if loose stools continue. If diarrhea occurs at bedtime, take 2 capsules at  bedtime. Then take 2 capsules every 4 hours until morning. Call [facility].    Diarrhea Sheet   If you are having loose stools/diarrhea, please purchase Imodium and begin taking as outlined:  At the first sign of poorly formed or loose stools you should begin taking Imodium (loperamide) 2 mg capsules.  Take two caplets (4mg ) followed by one caplet (2mg ) every 2 hours until you have had no diarrhea for 12 hours.  During the night take two caplets (4mg ) at bedtime and continue every 4 hours during the night until the morning.  Stop taking Imodium only after there is no sign of diarrhea for 12 hours.    Always call the [facility] if you are having loose stools/diarrhea that you can't get under control.  Loose stools/diarrhea leads to dehydration (loss of water) in your body.  We have other options of trying to get the loose stools/diarrhea to stop but you must let us know!   Constipation Sheet  Colace - 100 mg capsules - take 2 capsules daily.  If this doesn't help then you can increase to 2 capsules twice daily.  Please call if the above does not work for you.   Do not go more than 2 days without a bowel movement.  It is very important that you do not become constipated.  It will make you feel sick to your stomach (nausea) and can cause abdominal pain and vomiting.   Nausea Sheet   Compazine/Prochlorperazine 10mg  tablet. Take 1 tablet every 6 hours as needed for nausea/vomiting. (This can make you sleepy)  If you are having persistent nausea (nausea that does not stop) please call the [facility] and let us know the amount of nausea that you are experiencing.  If you begin to vomit, you need to call the [facility] and if it is the weekend and you have vomited more than one time and can't get it to stop-go to the Emergency Room.  Persistent nausea/vomiting can lead to dehydration (loss of fluid in your body) and will make you feel terrible.   Ice chips, sips of clear liquids, foods  that are @ room temperature, crackers, and toast tend to be better tolerated.   SYMPTOMS TO REPORT AS SOON AS POSSIBLE AFTER TREATMENT:   FEVER GREATER THAN 100.5 F  CHILLS WITH OR WITHOUT FEVER  NAUSEA AND VOMITING THAT IS NOT CONTROLLED WITH YOUR NAUSEA MEDICATION  UNUSUAL SHORTNESS OF BREATH  UNUSUAL BRUISING OR BLEEDING  TENDERNESS IN MOUTH AND THROAT WITH OR WITHOUT PRESENCE OF ULCERS  URINARY PROBLEMS  BOWEL PROBLEMS  UNUSUAL RASH      Wear comfortable clothing and clothing appropriate for easy access to any Portacath or PICC line. Let us know if there is anything that we can do to make your therapy better!    What to do if you need assistance after hours or on the weekends: CALL [phone].  HOLD on the line, do not hang up.  You will hear multiple messages but at the end you will be connected with a nurse triage line.  They will contact the doctor if necessary.  Most of the time they will be able to assist you.  Do not call the hospital operator.      I have been informed and understand all of the instructions given to me and have received a copy. I have  been instructed to call the clinic [phone] or my family physician as soon as possible for continued medical care, if indicated. I do not have any more questions at this time but understand that I may call the [facility] or the Patient Navigator at [phone] during office hours should I have questions or need assistance in obtaining follow-up care.

## 2019-02-02 NOTE — Progress Notes (Signed)
I spoke with Suanne Marker in pathology and ordered BRAF mutation testing on Accession # [id number].

## 2019-02-02 NOTE — Progress Notes (Signed)
[place], [place] [address]   CLINIC:  Medical Oncology/Hematology  PCP:  Fayrene Helper, MD [address] [place] [address] [phone]   REASON FOR VISIT: Follow-up for Stage 3 colon cancer  CURRENT THERAPY: work up   INTERVAL HISTORY:  [name] 63 y.o. female returns for routine follow-up for stage 3 colon cancer. She is here with her family. She is doing well since her surgery. She has decided she will take treatment and have a port placed. She denies any pain. Denies any nausea, vomiting, or diarrhea. She reports her appetite at 100% and her energy level at 75%. She is eating well and maintaining her weight at this time.     REVIEW OF SYSTEMS:  Review of Systems  All other systems reviewed and are negative.    PAST MEDICAL/SURGICAL HISTORY:  Past Medical History:  Diagnosis Date  . Hyperlipidemia   . Multinodular goiter   . Prediabetes    Past Surgical History:  Procedure Laterality Date  . BIOPSY  12/07/2018   Procedure: BIOPSY;  Surgeon: Rogene Houston, MD;  Location: [facility];  Service: Endoscopy;;  sigmoid colon  . COLON RESECTION N/A 01/09/2019   Procedure: LAPAROSCOPIC RIGHT HEMICOLECTOMY;  Surgeon: Virl Cagey, MD;  Location: [facility] OR[facility];  Service: General;  Laterality: N/A;  . COLONOSCOPY N/A 12/07/2018   Procedure: COLONOSCOPY;  Surgeon: Rogene Houston, MD;  Location: [facility];  Service: Endoscopy;  Laterality: N/A;  12:45  . DILATION AND CURETTAGE OF UTERUS  2006  . FNA thyroid  2011 and 2019   benign  . POLYPECTOMY  12/07/2018   Procedure: POLYPECTOMY;  Surgeon: Rogene Houston, MD;  Location: [facility];  Service: Endoscopy;;  splenic flexure (CS x1)  . TUBAL LIGATION  1992     SOCIAL HISTORY:  Social History   Socioeconomic History  . Marital status: Married    Spouse name: Not on file  . Number of children: 1  . Years of education: Not on file  . Highest  education level: Not on file  Occupational History  . Occupation: [employer]  . Financial resource strain: Not on file  . Food insecurity:    Worry: Not on file    Inability: Not on file  . Transportation needs:    Medical: Not on file    Non-medical: Not on file  Tobacco Use  . Smoking status: Current Every Day Smoker    Packs/day: 0.75    Years: 45.00    Pack years: 33.75    Types: Cigarettes  . Smokeless tobacco: Never Used  Substance and Sexual Activity  . Alcohol use: Yes    Alcohol/week: 0.0 standard drinks    Comment: occasionally  . Drug use: No  . Sexual activity: Yes  Lifestyle  . Physical activity:    Days per week: Not on file    Minutes per session: Not on file  . Stress: Not on file  Relationships  . Social connections:    Talks on phone: Not on file    Gets together: Not on file    Attends religious service: Not on file    Active member of club or organization: Not on file    Attends meetings of clubs or organizations: Not on file    Relationship status: Not on file  . Intimate partner violence:    Fear of current  or ex partner: Not on file    Emotionally abused: Not on file    Physically abused: Not on file    Forced sexual activity: Not on file  Other Topics Concern  . Not on file  Social History Narrative  . Not on file    FAMILY HISTORY:  Family History  Problem Relation Age of Onset  . Hypertension Mother        AAA  . Coronary artery disease Mother   . Hyperlipidemia Mother   . Cancer Mother        lung  . Hypertension Father   . Cancer Brother     CURRENT MEDICATIONS:  Outpatient Encounter Medications as of 02/02/2019  Medication Sig Note  . acetaminophen (TYLENOL) 325 MG tablet Take 650 mg by mouth daily as needed for moderate pain or headache.   . ALPRAZolam (XANAX) 0.25 MG tablet Take one tablet at bedtime, as needed , for anxiety   . aspirin EC 81 MG tablet Take 1 tablet (81 mg total) by mouth  every evening.   . Calcium Carbonate-Vitamin D (CALCIUM 600+D) 600-400 MG-UNIT tablet Take 1 tablet by mouth 2 (two) times a week. Sunday and Wednesday evenings   . docusate sodium (COLACE) 100 MG capsule Take 1 capsule (100 mg total) by mouth 2 (two) times daily.   . naproxen sodium (ALEVE) 220 MG tablet Take 220 mg by mouth daily as needed (pain).   . ondansetron (ZOFRAN-ODT) 4 MG disintegrating tablet Take 1 tablet (4 mg total) by mouth every 6 (six) hours as needed for nausea.   . pantoprazole (PROTONIX) 40 MG tablet Take 1 tablet (40 mg total) by mouth daily. 01/27/2019: Reduce dose to 3 times weekly effective 01/2019  . pravastatin (PRAVACHOL) 40 MG tablet TAKE 1 TABLET BY MOUTH  DAILY IN THE EVENING (Patient taking differently: Take 40 mg by mouth 4 (four) times a week. )    No facility-administered encounter medications on file as of 02/02/2019.     ALLERGIES:  No Known Allergies   PHYSICAL EXAM:  ECOG Performance status: 1  Vitals:   02/02/19 0900  BP: (!) 157/84  Pulse: 86  Resp: 16  Temp: 98.6 F (37 C)  SpO2: 100%   Filed Weights   02/02/19 0900  Weight: 126 lb 6 oz (57.3 kg)    Physical Exam Constitutional:      Appearance: Normal appearance. She is normal weight.  Musculoskeletal: Normal range of motion.  Skin:    General: Skin is warm and dry.  Neurological:     Mental Status: She is alert and oriented to person, place, and time. Mental status is at baseline.  Psychiatric:        Mood and Affect: Mood normal.        Behavior: Behavior normal.        Thought Content: Thought content normal.        Judgment: Judgment normal.   Chest: Bilateral clear to auscultation CVS: S1-S2 regular rate and rhythm. Extremities: No edema or cyanosis.   LABORATORY DATA:  I have reviewed the labs as listed.  CBC    Component Value Date/Time   WBC 5.3 01/12/2019 0547   RBC 3.07 (L) 01/12/2019 0547   HGB 11.0 (L) 01/12/2019 0547   HGB 13.5 08/18/2018 0758   HCT 32.8  (L) 01/12/2019 0547   HCT 37.2 08/18/2018 0758   PLT 198 01/12/2019 0547   PLT 259 08/18/2018 0758   MCV 106.8 (H) 01/12/2019 0547  MCV 104 (H) 08/18/2018 0758   MCH 35.8 (H) 01/12/2019 0547   MCHC 33.5 01/12/2019 0547   RDW 13.7 01/12/2019 0547   RDW 13.0 08/18/2018 0758   LYMPHSABS 1.6 01/12/2019 0547   LYMPHSABS 3.5 (H) 08/31/2017 0955   MONOABS 0.4 01/12/2019 0547   EOSABS 0.1 01/12/2019 0547   EOSABS 0.1 08/31/2017 0955   BASOSABS 0.0 01/12/2019 0547   BASOSABS 0.0 08/31/2017 0955   CMP Latest Ref Rng & Units 01/12/2019 01/11/2019 01/10/2019  Glucose 70 - 99 mg/dL 87 116(H) 91  BUN 8 - 23 mg/dL 8 5(L) 5(L)  Creatinine 0.44 - 1.00 mg/dL 0.80 0.87 0.86  Sodium 135 - 145 mmol/L 140 138 140  Potassium 3.5 - 5.1 mmol/L 3.6 3.9 3.5  Chloride 98 - 111 mmol/L 106 103 106  CO2 22 - 32 mmol/L _0 Calcium 8.9 - 10.3 mg/dL 8.9 9.1 8.6(L)  Total Protein 6.5 - 8.1 g/dL - - -  Total Bilirubin 0.3 - 1.2 mg/dL - - -  Alkaline Phos 38 - 126 U/L - - -  AST 15 - 41 U/L - - -  ALT 0 - 44 U/L - - -       DIAGNOSTIC IMAGING:  I have independently reviewed the scans and discussed with the patient.   I have reviewed Francene Finders, NP's note and agree with the documentation.  I personally performed a face-to-face visit, made revisions and my assessment and plan is as follows.    ASSESSMENT & PLAN:   Malignant neoplasm of transverse colon ([facility]) 1.  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  Biopsy was consistent with invasive poorly differentiated adenocarcinoma.  Small polyp was found in the proximal sigmoid colon which was sessile.  External hemorrhoids were present. -CT of the abdomen and pelvis on 12/16/2018 showing a 4.2 x 2.9 cm near circumferential apple core type mass involving the proximal transverse colon with a few small lymph nodes noted  in the pericolonic fat.  No retroperitoneal adenopathy or evidence of liver mets. -Laparoscopic right colectomy on 01/09/2019. -Pathology showed 4 cm moderately differentiated invasive adenocarcinoma, grade 2, 1/25 lymph nodes positive, no tumor deposits, no perineural invasion.  Tumor extends through serosa into adherent omentum.  Margins are negative. - Mismatch repair protein IHC shows loss of nuclear expression of MLH1. -MSI-high.  Hence I have recommended BRAF testing. - PET CT scan on 01/31/2019 shows focal hypermetabolic activity identified at the ileocolic anastomosis with SUV max of 9.  This could be related to postsurgical changes, but follow-up as needed.  Low-level FDG uptake in the midline scar likely related to healing. -CEA level on 01/18/2019 was mildly elevated at 5.1. - We talked about adjuvant chemotherapy with FOLFOX for 6 months.  We talked about the various side effects and improvement in survival associated with chemotherapy.  Because of her T4 status, I do not believe 58-monthchemotherapy is an option.  We talked about possibility of peripheral neuropathy in detail. - Patient is agreeable to adjuvant chemotherapy.  She will have a port placed by Dr. BConstance Haw -She will come back after the port placement.  2.  Microcytosis: -She had macrocytosis with an MCV of 106. -I have checked her BE83 folic acid and TSH levels which were within normal limits.  We will continue to monitor closely.    Total time spent is 40 minutes with more than  50% of the time spent face-to-face discussing PET scan results, CEA results, adjuvant chemotherapy, side effects and coordination of care.  Orders placed this encounter:  No orders of the defined types were placed in this encounter.     Derek Jack, MD [facility] [phone]

## 2019-02-02 NOTE — Assessment & Plan Note (Addendum)
1.  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  Biopsy was consistent with invasive poorly differentiated adenocarcinoma.  Small polyp was found in the proximal sigmoid colon which was sessile.  External hemorrhoids were present. -CT of the abdomen and pelvis on 12/16/2018 showing a 4.2 x 2.9 cm near circumferential apple core type mass involving the proximal transverse colon with a few small lymph nodes noted in the pericolonic fat.  No retroperitoneal adenopathy or evidence of liver mets. -Laparoscopic right colectomy on 01/09/2019. -Pathology showed 4 cm moderately differentiated invasive adenocarcinoma, grade 2, 1/25 lymph nodes positive, no tumor deposits, no perineural invasion.  Tumor extends through serosa into adherent omentum.  Margins are negative. - Mismatch repair protein IHC shows loss of nuclear expression of MLH1. -MSI-high.  Hence I have recommended BRAF testing. - PET CT scan on 01/31/2019 shows focal hypermetabolic activity identified at the ileocolic anastomosis with SUV max of 9.  This could be related to postsurgical changes, but follow-up as needed.  Low-level FDG uptake in the midline scar likely related to healing. -CEA level on 01/18/2019 was mildly elevated at 5.1. - We talked about adjuvant chemotherapy with FOLFOX for 6 months.  We talked about the various side effects and improvement in survival associated with chemotherapy.  Because of her T4 status, I do not believe 41-monthchemotherapy is an option.  We talked about possibility of peripheral neuropathy in detail. - Patient is agreeable to adjuvant chemotherapy.  She will have a port placed by Dr. BConstance Haw -She will come back after the port placement.  2.  Microcytosis: -She had macrocytosis with an MCV of 106. -I have checked her BB30 folic acid and TSH levels which were within normal  limits.  We will continue to monitor closely.

## 2019-02-07 ENCOUNTER — Encounter: Payer: Self-pay | Admitting: General Surgery

## 2019-02-07 ENCOUNTER — Ambulatory Visit (INDEPENDENT_AMBULATORY_CARE_PROVIDER_SITE_OTHER): Payer: Self-pay | Admitting: General Surgery

## 2019-02-07 VITALS — BP 148/75 | HR 73 | Temp 97.3°F | Resp 16 | Wt 126.2 lb

## 2019-02-07 DIAGNOSIS — C184 Malignant neoplasm of transverse colon: Secondary | ICD-10-CM

## 2019-02-07 NOTE — Progress Notes (Signed)
[facility] History and Physical   HPI: Ms. Deuser is a 63 yo well known to me s/p laparoscopic right hemi colectomy for Stage III Colon cancer. She has started her chemotherapy and will need a port a catheter placed for finishing her chemotherapy. She did well with the initial treatment. She has no complaints today. She is healing and is moving better. She is tolerating a diet but does have some RLQ pain after food intake and "feels the food stop" in the RLQ.    She denies any nausea,vomiting, and has regular BMs.  She does take some stool softener.   Past Medical History:  Diagnosis Date  . Hyperlipidemia   . Multinodular goiter   . Prediabetes     Past Surgical History:  Procedure Laterality Date  . BIOPSY  12/07/2018   Procedure: BIOPSY;  Surgeon: Rogene Houston, MD;  [facility];  Service: Endoscopy;;  sigmoid colon  . COLON RESECTION N/A 01/09/2019   Procedure: LAPAROSCOPIC RIGHT HEMICOLECTOMY;  Surgeon: Virl Cagey, MD;  Location: [facility];  Service: General;  Laterality: N/A;  . COLONOSCOPY N/A 12/07/2018   Procedure: COLONOSCOPY;  Surgeon: Rogene Houston, MD;  [facility];  Service: Endoscopy;  Laterality: N/A;  12:45  . DILATION AND CURETTAGE OF UTERUS  2006  . FNA thyroid  2011 and 2019   benign  . POLYPECTOMY  12/07/2018   Procedure: POLYPECTOMY;  Surgeon: Rogene Houston, MD;  [facility];  Service: Endoscopy;;  splenic flexure (CS x1)  . TUBAL LIGATION  1992    Family History  Problem Relation Age of Onset  . Hypertension Mother        AAA  . Coronary artery disease Mother   . Hyperlipidemia Mother   . Cancer Mother        lung  . Hypertension Father   . Cancer Brother     Social History   Tobacco Use  . Smoking status: Current Every Day Smoker    Packs/day: 0.75    Years: 45.00    Pack years: 33.75    Types: Cigarettes  . Smokeless tobacco: Never Used  Substance Use Topics  . Alcohol  use: Yes    Alcohol/week: 0.0 standard drinks    Comment: occasionally  . Drug use: No    Medications: I have reviewed the patient's current medications. Allergies as of 02/07/2019   No Known Allergies     Medication List       Accurate as of February 07, 2019  9:22 AM. Always use your most recent med list.        acetaminophen 500 MG tablet Commonly known as:  TYLENOL Take 1,000 mg by mouth every 6 (six) hours as needed for moderate pain.   ALPRAZolam 0.25 MG tablet Commonly known as:  XANAX Take one tablet at bedtime, as needed , for anxiety   aspirin EC 81 MG tablet Take 1 tablet (81 mg total) by mouth every evening.   CALCIUM 600+D 600-400 MG-UNIT tablet Generic drug:  Calcium Carbonate-Vitamin D Take 1 tablet by mouth 2 (two) times a week. Sunday and Wednesday evenings   docusate sodium 100 MG capsule Commonly known as:  COLACE Take 1 capsule (100 mg total) by mouth 2 (two) times daily.   ondansetron 4 MG disintegrating tablet Commonly known as:  ZOFRAN-ODT Take 1 tablet (4 mg total) by mouth every 6 (six) hours as needed for nausea.   pantoprazole 40 MG  tablet Commonly known as:  PROTONIX Take 1 tablet (40 mg total) by mouth daily.   pravastatin 40 MG tablet Commonly known as:  PRAVACHOL TAKE 1 TABLET BY MOUTH  DAILY IN THE EVENING        ROS:  A comprehensive review of systems was negative except for: Gastrointestinal: positive for formed stools, no nausea, vomiting  Blood pressure (!) 148/75, pulse 73, temperature (!) 97.3 F (36.3 C), temperature source Temporal, resp. rate 16, weight 126 lb 3.2 oz (57.2 kg). Physical Exam Vitals signs reviewed.  Constitutional:      Appearance: Normal appearance.  HENT:     Head: Normocephalic.     Nose: Nose normal.     Mouth/Throat:     Mouth: Mucous membranes are moist.  Eyes:     Pupils: Pupils are equal, round, and reactive to light.  Neck:     Musculoskeletal: Normal range of motion.    Cardiovascular:     Rate and Rhythm: Normal rate and regular rhythm.  Pulmonary:     Effort: Pulmonary effort is normal.     Breath sounds: Normal breath sounds.  Abdominal:     General: Abdomen is flat. There is no distension.     Palpations: Abdomen is soft.     Tenderness: There is no abdominal tenderness.     Comments: Well healing incisions  Musculoskeletal:        General: No swelling.  Skin:    General: Skin is warm.  Neurological:     General: No focal deficit present.     Mental Status: She is alert and oriented to person, place, and time.  Psychiatric:        Mood and Affect: Mood normal.        Thought Content: Thought content normal.        Judgment: Judgment normal.      Assessment & Plan:  ASHLY YEPEZ is a 63 y.o. female with Stage III colon cancer who has started chemotherapy. She takes her next treatment in a few weeks. We will get her port a catheter in as soon as possible. She is overall healing well and improving. 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 I have warned her against starting back at work too soon given the chemotherapy and side effects.   -Surgically she can go back to work, but can discuss her decision for going back to work with Dr. Delton Coombes with her chemotherapy treatments -Discussed port a catheter placement risk and benefits including but not limited to bleeding, infection, pneumothorax at the time of placement.  -She is right handed so we will start on the left.   All questions were answered to the satisfaction of the patient and family.   Virl Cagey 02/07/2019, 9:22 AM

## 2019-02-07 NOTE — Patient Instructions (Addendum)
Orgain protein supplements. Chocolate.   Implanted [facility] Guide An implanted port is a device that is placed under the skin. It is usually placed in the chest. The device can be used to give IV medicine, to take blood, or for dialysis. You may have an implanted port if:  You need IV medicine that would be irritating to the small veins in your hands or arms.  You need IV medicines, such as antibiotics, for a long period of time.  You need IV nutrition for a long period of time.  You need dialysis. Having a port means that your health care provider will not need to use the veins in your arms for these procedures. You may have fewer limitations when using a port than you would if you used other types of long-term IVs, and you will likely be able to return to normal activities after your incision heals. An implanted port has two main parts:  Reservoir. The reservoir is the part where a needle is inserted to give medicines or draw blood. The reservoir is round. After it is placed, it appears as a small, raised area under your skin.  Catheter. The catheter is a thin, flexible tube that connects the reservoir to a vein. Medicine that is inserted into the reservoir goes into the catheter and then into the vein. How is my port accessed? To access your port:  A numbing cream may be placed on the skin over the port site.  Your health care provider will put on a mask and sterile gloves.  The skin over your port will be cleaned carefully with a germ-killing soap and allowed to dry.  Your health care provider will gently pinch the port and insert a needle into it.  Your health care provider will check for a blood return to make sure the port is in the vein and is not clogged.  If your port needs to remain accessed to get medicine continuously (constant infusion), your health care provider will place a clear bandage (dressing) over the needle site. The dressing and needle will need to be changed  every week, or as told by your health care provider. What is flushing? Flushing helps keep the port from getting clogged. Follow instructions from your health care provider about how and when to flush the port. Ports are usually flushed with saline solution or a medicine called heparin. The need for flushing will depend on how the port is used:  If the port is only used from time to time to give medicines or draw blood, the port may need to be flushed: ? Before and after medicines have been given. ? Before and after blood has been drawn. ? As part of routine maintenance. Flushing may be recommended every 4-6 weeks.  If a constant infusion is running, the port may not need to be flushed.  Throw away any syringes in a disposal container that is meant for sharp items (sharps container). You can buy a sharps container from a pharmacy, or you can make one by using an empty hard plastic bottle with a cover. How long will my port stay implanted? The port can stay in for as long as your health care provider thinks it is needed. When it is time for the port to come out, a surgery will be done to remove it. The surgery will be similar to the procedure that was done to put the port in. Follow these instructions at home:   Flush your port as  told by your health care provider.  If you need an infusion over several days, follow instructions from your health care provider about how to take care of your port site. Make sure you: ? Wash your hands with soap and water before you change your dressing. If soap and water are not available, use alcohol-based hand sanitizer. ? Change your dressing as told by your health care provider. ? Place any used dressings or infusion bags into a plastic bag. Throw that bag in the trash. ? Keep the dressing that covers the needle clean and dry. Do not get it wet. ? Do not use scissors or sharp objects near the tube. ? Keep the tube clamped, unless it is being used.  Check  your port site every day for signs of infection. Check for: ? Redness, swelling, or pain. ? Fluid or blood. ? Pus or a bad smell.  Protect the skin around the port site. ? Avoid wearing bra straps that rub or irritate the site. ? Protect the skin around your port from seat belts. Place a soft pad over your chest if needed.  Bathe or shower as told by your health care provider. The site may get wet as long as you are not actively receiving an infusion.  Return to your normal activities as told by your health care provider. Ask your health care provider what activities are safe for you.  Carry a medical alert card or wear a medical alert bracelet at all times. This will let health care providers know that you have an implanted port in case of an emergency. Get help right away if:  You have redness, swelling, or pain at the port site.  You have fluid or blood coming from your port site.  You have pus or a bad smell coming from the port site.  You have a fever. Summary  Implanted ports are usually placed in the chest for long-term IV access.  Follow instructions from your health care provider about flushing the port and changing bandages (dressings).  Take care of the area around your port by avoiding clothing that puts pressure on the area, and by watching for signs of infection.  Protect the skin around your port from seat belts. Place a soft pad over your chest if needed.  Get help right away if you have a fever or you have redness, swelling, pain, drainage, or a bad smell at the port site. This information is not intended to replace advice given to you by your health care provider. Make sure you discuss any questions you have with your health care provider. Document Released: 12/07/2005 Document Revised: 01/09/2017 Document Reviewed: 01/09/2017 Elsevier Interactive Patient Education  2019 [employer].

## 2019-02-09 ENCOUNTER — Encounter (HOSPITAL_COMMUNITY): Payer: Self-pay

## 2019-02-10 ENCOUNTER — Encounter (HOSPITAL_COMMUNITY)
Admission: RE | Admit: 2019-02-10 | Discharge: 2019-02-10 | Disposition: A | Discharge status: Home / Self Care | Payer: 59 | Source: Ambulatory Visit | Attending: General Surgery | Admitting: General Surgery

## 2019-02-10 NOTE — H&P (Signed)
[facility] History and Physical  HPI: Ms. Goering is a 63 yo well known to me s/p laparoscopic right hemi colectomy for Stage III Colon cancer. She has started her chemotherapy and will need a port a catheter placed for finishing her chemotherapy. She did well with the initial treatment. She has no complaints today. She is healing and is moving better. She is tolerating a diet but does have some RLQ pain after food intake and "feels the food stop" in the RLQ.    She denies any nausea,vomiting, and has regular BMs.  She does take some stool softener.       Past Medical History:  Diagnosis Date  . Hyperlipidemia   . Multinodular goiter   . Prediabetes          Past Surgical History:  Procedure Laterality Date  . BIOPSY  12/07/2018   Procedure: BIOPSY;  Surgeon: Rogene Houston, MD;  [facility];  Service: Endoscopy;;  sigmoid colon  . COLON RESECTION N/A 01/09/2019   Procedure: LAPAROSCOPIC RIGHT HEMICOLECTOMY;  Surgeon: Virl Cagey, MD;  Location: [facility];  Service: General;  Laterality: N/A;  . COLONOSCOPY N/A 12/07/2018   Procedure: COLONOSCOPY;  Surgeon: Rogene Houston, MD;  [facility];  Service: Endoscopy;  Laterality: N/A;  12:45  . DILATION AND CURETTAGE OF UTERUS  2006  . FNA thyroid  2011 and 2019   benign  . POLYPECTOMY  12/07/2018   Procedure: POLYPECTOMY;  Surgeon: Rogene Houston, MD;  [facility];  Service: Endoscopy;;  splenic flexure (CS x1)  . TUBAL LIGATION  1992         Family History  Problem Relation Age of Onset  . Hypertension Mother        AAA  . Coronary artery disease Mother   . Hyperlipidemia Mother   . Cancer Mother        lung  . Hypertension Father   . Cancer Brother     Social History        Tobacco Use  . Smoking status: Current Every Day Smoker    Packs/day: 0.75    Years: 45.00    Pack years: 33.75    Types: Cigarettes  .  Smokeless tobacco: Never Used  Substance Use Topics  . Alcohol use: Yes    Alcohol/week: 0.0 standard drinks    Comment: occasionally  . Drug use: No    Medications: I have reviewed the patient's current medications. Allergies as of 02/07/2019   No Known Allergies        Medication List       Accurate as of February 07, 2019  9:22 AM. Always use your most recent med list.        acetaminophen 500 MG tablet Commonly known as:  TYLENOL Take 1,000 mg by mouth every 6 (six) hours as needed for moderate pain.   ALPRAZolam 0.25 MG tablet Commonly known as:  XANAX Take one tablet at bedtime, as needed , for anxiety   aspirin EC 81 MG tablet Take 1 tablet (81 mg total) by mouth every evening.   CALCIUM 600+D 600-400 MG-UNIT tablet Generic drug:  Calcium Carbonate-Vitamin D Take 1 tablet by mouth 2 (two) times a week. Sunday and Wednesday evenings   docusate sodium 100 MG capsule Commonly known as:  COLACE Take 1 capsule (100 mg total) by mouth 2 (two) times daily.   ondansetron 4 MG disintegrating tablet Commonly known as:  ZOFRAN-ODT  Take 1 tablet (4 mg total) by mouth every 6 (six) hours as needed for nausea.   pantoprazole 40 MG tablet Commonly known as:  PROTONIX Take 1 tablet (40 mg total) by mouth daily.   pravastatin 40 MG tablet Commonly known as:  PRAVACHOL TAKE 1 TABLET BY MOUTH  DAILY IN THE EVENING        ROS:  A comprehensive review of systems was negative except for: Gastrointestinal: positive for formed stools, no nausea, vomiting  Blood pressure (!) 148/75, pulse 73, temperature (!) 97.3 F (36.3 C), temperature source Temporal, resp. rate 16, weight 126 lb 3.2 oz (57.2 kg). Physical Exam Vitals signs reviewed.  Constitutional:      Appearance: Normal appearance.  HENT:     Head: Normocephalic.     Nose: Nose normal.     Mouth/Throat:     Mouth: Mucous membranes are moist.  Eyes:     Pupils: Pupils are equal,  round, and reactive to light.  Neck:     Musculoskeletal: Normal range of motion.  Cardiovascular:     Rate and Rhythm: Normal rate and regular rhythm.  Pulmonary:     Effort: Pulmonary effort is normal.     Breath sounds: Normal breath sounds.  Abdominal:     General: Abdomen is flat. There is no distension.     Palpations: Abdomen is soft.     Tenderness: There is no abdominal tenderness.     Comments: Well healing incisions  Musculoskeletal:        General: No swelling.  Skin:    General: Skin is warm.  Neurological:     General: No focal deficit present.     Mental Status: She is alert and oriented to person, place, and time.  Psychiatric:        Mood and Affect: Mood normal.        Thought Content: Thought content normal.        Judgment: Judgment normal.      Assessment & Plan:  LINZEY RAMSER is a 63 y.o. female with Stage III colon cancer who has started chemotherapy. She takes her next treatment in a few weeks. We will get her port a catheter in as soon as possible. She is overall healing well and improving. 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 I have warned her against starting back at work too soon given the chemotherapy and side effects.   -Surgically she can go back to work, but can discuss her decision for going back to work with Dr. Delton Coombes with her chemotherapy treatments -Discussed port a catheter placement risk and benefits including but not limited to bleeding, infection, pneumothorax at the time of placement.  -She is right handed so we will start on the left.   All questions were answered to the satisfaction of the patient and family.   Virl Cagey 02/07/2019, 9:22 AM

## 2019-02-15 ENCOUNTER — Ambulatory Visit (HOSPITAL_COMMUNITY): Payer: 59

## 2019-02-15 ENCOUNTER — Encounter (HOSPITAL_COMMUNITY): Payer: Self-pay | Admitting: *Deleted

## 2019-02-15 ENCOUNTER — Ambulatory Visit (HOSPITAL_COMMUNITY): Payer: 59 | Admitting: Anesthesiology

## 2019-02-15 ENCOUNTER — Ambulatory Visit (HOSPITAL_COMMUNITY)
Admission: RE | Admit: 2019-02-15 | Discharge: 2019-02-15 | Disposition: A | Discharge status: Home / Self Care | Payer: 59 | Attending: General Surgery | Admitting: General Surgery

## 2019-02-15 ENCOUNTER — Encounter (HOSPITAL_COMMUNITY)
Admission: RE | Disposition: A | Discharge status: Home / Self Care | Payer: Self-pay | Source: Home / Self Care | Attending: General Surgery

## 2019-02-15 DIAGNOSIS — C184 Malignant neoplasm of transverse colon: Secondary | ICD-10-CM | POA: Insufficient documentation

## 2019-02-15 DIAGNOSIS — F17210 Nicotine dependence, cigarettes, uncomplicated: Secondary | ICD-10-CM | POA: Insufficient documentation

## 2019-02-15 DIAGNOSIS — E119 Type 2 diabetes mellitus without complications: Secondary | ICD-10-CM | POA: Insufficient documentation

## 2019-02-15 DIAGNOSIS — C189 Malignant neoplasm of colon, unspecified: Secondary | ICD-10-CM | POA: Diagnosis present

## 2019-02-15 DIAGNOSIS — E785 Hyperlipidemia, unspecified: Secondary | ICD-10-CM | POA: Diagnosis not present

## 2019-02-15 DIAGNOSIS — Z9049 Acquired absence of other specified parts of digestive tract: Secondary | ICD-10-CM | POA: Insufficient documentation

## 2019-02-15 DIAGNOSIS — Z95828 Presence of other vascular implants and grafts: Secondary | ICD-10-CM

## 2019-02-15 DIAGNOSIS — Z7982 Long term (current) use of aspirin: Secondary | ICD-10-CM | POA: Diagnosis not present

## 2019-02-15 DIAGNOSIS — F329 Major depressive disorder, single episode, unspecified: Secondary | ICD-10-CM | POA: Insufficient documentation

## 2019-02-15 DIAGNOSIS — Z79899 Other long term (current) drug therapy: Secondary | ICD-10-CM | POA: Insufficient documentation

## 2019-02-15 HISTORY — PX: PORTACATH PLACEMENT: SHX2246

## 2019-02-15 SURGERY — INSERTION, TUNNELED CENTRAL VENOUS DEVICE, WITH PORT
Anesthesia: Monitor Anesthesia Care | Site: Chest | Laterality: Left

## 2019-02-15 MED ORDER — KETOROLAC TROMETHAMINE 30 MG/ML IJ SOLN: 30.0000 mg | Freq: Once | INTRAMUSCULAR | Status: DC | PRN

## 2019-02-15 MED ORDER — PROPOFOL 500 MG/50ML IV EMUL
INTRAVENOUS | Status: DC | PRN
  Administered 2019-02-15: 100 ug/kg/min via INTRAVENOUS

## 2019-02-15 MED ORDER — SODIUM CHLORIDE FLUSH 0.9 % IV SOLN
INTRAVENOUS | Status: DC | PRN
  Administered 2019-02-15: 20 mL via INTRAVENOUS

## 2019-02-15 MED ORDER — LIDOCAINE HCL (PF) 1 % IJ SOLN
INTRAMUSCULAR | Status: DC | PRN
  Administered 2019-02-15: 15 mL

## 2019-02-15 MED ORDER — MIDAZOLAM HCL 5 MG/5ML IJ SOLN
INTRAMUSCULAR | Status: DC | PRN
  Administered 2019-02-15: 1 mg via INTRAVENOUS

## 2019-02-15 MED ORDER — MEPERIDINE HCL 50 MG/ML IJ SOLN: 6.2500 mg | INTRAMUSCULAR | Status: DC | PRN

## 2019-02-15 MED ORDER — ONDANSETRON HCL 4 MG/2ML IJ SOLN: 4.0000 mg | Freq: Once | INTRAMUSCULAR | Status: DC | PRN

## 2019-02-15 MED ORDER — FENTANYL CITRATE (PF) 100 MCG/2ML IJ SOLN
INTRAMUSCULAR | Status: AC
  Filled 2019-02-15: qty 2

## 2019-02-15 MED ORDER — CEFAZOLIN SODIUM-DEXTROSE 2-4 GM/100ML-% IV SOLN
INTRAVENOUS | Status: AC
  Filled 2019-02-15: qty 100

## 2019-02-15 MED ORDER — FENTANYL CITRATE (PF) 100 MCG/2ML IJ SOLN
INTRAMUSCULAR | Status: DC | PRN
  Administered 2019-02-15 (×2): 25 ug via INTRAVENOUS

## 2019-02-15 MED ORDER — HEPARIN SOD (PORK) LOCK FLUSH 100 UNIT/ML IV SOLN
INTRAVENOUS | Status: AC
  Filled 2019-02-15: qty 5

## 2019-02-15 MED ORDER — MIDAZOLAM HCL 2 MG/2ML IJ SOLN
INTRAMUSCULAR | Status: AC
  Filled 2019-02-15: qty 2

## 2019-02-15 MED ORDER — PROPOFOL 10 MG/ML IV BOLUS
INTRAVENOUS | Status: DC | PRN
  Administered 2019-02-15 (×2): 20 mg via INTRAVENOUS

## 2019-02-15 MED ORDER — PROPOFOL 10 MG/ML IV BOLUS
INTRAVENOUS | Status: AC
  Filled 2019-02-15: qty 20

## 2019-02-15 MED ORDER — HEPARIN SOD (PORK) LOCK FLUSH 100 UNIT/ML IV SOLN
INTRAVENOUS | Status: DC | PRN
  Administered 2019-02-15: 500 [IU] via INTRAVENOUS

## 2019-02-15 MED ORDER — LACTATED RINGERS IV SOLN
INTRAVENOUS | Status: DC
  Administered 2019-02-15: 1000 mL via INTRAVENOUS

## 2019-02-15 MED ORDER — CEFAZOLIN SODIUM-DEXTROSE 2-4 GM/100ML-% IV SOLN
2.0000 g | INTRAVENOUS | Status: AC
  Administered 2019-02-15: 2 g via INTRAVENOUS

## 2019-02-15 MED ORDER — HYDROMORPHONE HCL 1 MG/ML IJ SOLN: 0.2500 mg | INTRAMUSCULAR | Status: DC | PRN

## 2019-02-15 MED ORDER — HYDROCODONE-ACETAMINOPHEN 7.5-325 MG PO TABS: 1.0000 | ORAL_TABLET | Freq: Once | ORAL | Status: DC | PRN

## 2019-02-15 MED ORDER — LIDOCAINE HCL (PF) 1 % IJ SOLN
INTRAMUSCULAR | Status: AC
  Filled 2019-02-15: qty 30

## 2019-02-15 MED ORDER — CHLORHEXIDINE GLUCONATE CLOTH 2 % EX PADS: 6.0000 | MEDICATED_PAD | Freq: Once | CUTANEOUS | Status: DC

## 2019-02-15 SURGICAL SUPPLY — 36 items
APPLIER CLIP 9.375 SM OPEN (CLIP)
APR CLP SM 9.3 20 MLT OPN (CLIP)
BAG DECANTER FOR FLEXI CONT (MISCELLANEOUS) ×2 IMPLANT
CHLORAPREP W/TINT 10.5 ML (MISCELLANEOUS) ×2 IMPLANT
CLIP APPLIE 9.375 SM OPEN (CLIP) IMPLANT
CLOTH BEACON ORANGE TIMEOUT ST (SAFETY) ×2 IMPLANT
COVER LIGHT HANDLE STERIS (MISCELLANEOUS) ×4 IMPLANT
COVER WAND RF STERILE (DRAPES) ×2 IMPLANT
DECANTER SPIKE VIAL GLASS SM (MISCELLANEOUS) ×2 IMPLANT
DERMABOND ADVANCED (GAUZE/BANDAGES/DRESSINGS) ×1
DERMABOND ADVANCED .7 DNX12 (GAUZE/BANDAGES/DRESSINGS) ×1 IMPLANT
DRAPE C-ARM FOLDED MOBILE STRL (DRAPES) ×2 IMPLANT
ELECT REM PT RETURN 9FT ADLT (ELECTROSURGICAL) ×2
ELECTRODE REM PT RTRN 9FT ADLT (ELECTROSURGICAL) ×1 IMPLANT
GLOVE BIO SURGEON STRL SZ 6.5 (GLOVE) ×2 IMPLANT
GLOVE BIO SURGEON STRL SZ7 (GLOVE) ×2 IMPLANT
GLOVE BIOGEL PI IND STRL 6.5 (GLOVE) ×1 IMPLANT
GLOVE BIOGEL PI IND STRL 7.0 (GLOVE) ×2 IMPLANT
GLOVE BIOGEL PI INDICATOR 6.5 (GLOVE) ×1
GLOVE BIOGEL PI INDICATOR 7.0 (GLOVE) ×2
GOWN STRL REUS W/TWL LRG LVL3 (GOWN DISPOSABLE) ×4 IMPLANT
IV NS 500ML (IV SOLUTION) ×1
IV NS 500ML BAXH (IV SOLUTION) ×1 IMPLANT
KIT PORT POWER 8FR ISP MRI (Port) ×2 IMPLANT
KIT TURNOVER KIT A (KITS) ×2 IMPLANT
MANIFOLD NEPTUNE II (INSTRUMENTS) ×2 IMPLANT
NEEDLE HYPO 25X1 1.5 SAFETY (NEEDLE) ×2 IMPLANT
PACK MINOR (CUSTOM PROCEDURE TRAY) ×2 IMPLANT
PAD ARMBOARD 7.5X6 YLW CONV (MISCELLANEOUS) ×2 IMPLANT
SET BASIN LINEN APH (SET/KITS/TRAYS/PACK) ×2 IMPLANT
SUT MNCRL AB 4-0 PS2 18 (SUTURE) ×2 IMPLANT
SUT PROLENE 2 0 SH 30 (SUTURE) ×2 IMPLANT
SUT VIC AB 3-0 SH 27 (SUTURE) ×2
SUT VIC AB 3-0 SH 27X BRD (SUTURE) ×1 IMPLANT
SYR 20CC LL (SYRINGE) ×2 IMPLANT
SYR CONTROL 10ML LL (SYRINGE) ×2 IMPLANT

## 2019-02-15 NOTE — Op Note (Signed)
Operative Note 02/15/19   Preoperative Diagnosis:  Colon cancer    Postoperative Diagnosis: Same   Procedure(s) Performed: Port-A-Cath placement, left subclavian    Surgeon: Lanell Matar. Constance Haw, MD   Assistants: No qualified resident was available   Anesthesia: Monitored anesthesia care   Anesthesiologist: Nicanor Alcon, MD    Specimens: None   Estimated Blood Loss: Minimal   Blood Replacement: None    Complications: None    Operative Findings: Narrow chest/ angled clavicles, approach to the vein more lateral than able to make the pocket, the bend of the catheter was viewed on fluoro as well as visually, the bend is a soft curve in the anterior/ posterior position, so appears more kinked on the AP than truly is in real time   Indications:  Andrea Simon is a 63 yo with colon cancer who requires chemotherapy treatment. She is right handed, so we discussed placement on the left side. We discussed the risk and benefits of surgery including but not limited to bleeding, infection, malfunction, pneumothorax, and need to use the opposite side, she opted to proceed.   Procedure: The patient was brought into the operating room and monitored anesthesia care was induced.  One percent lidocaine was used for local anesthesia.   The left and right chest and neck was prepped and draped in the usual sterile fashion.  Preoperative antibiotics were given.  An incision was made below the left clavicle. A subcutaneous pocket was formed. The needles advanced into the left subclavian vein using the Seldinger technique without difficulty. A guidewire was then advanced but was coiling.  The clavicles were very angled, and we attempted to pull down her arm to straighten out the shoulder. This did not help.  She has a very narrow chest, so given the trajectory, I attempted a stick laterally from the pocket.  The subclavian was accessed without issues and the wire was directed into the right atrium under  fluoroscopic guidance.  Ectopia was noted and the wire was pulled back slightly.  Since the vein was accessed outside of the pocket the tunneler was used to thread the catheter to the wire.  An introducer and peel-away sheath were placed over the guidewire. The catheter was then inserted through the peel-away sheath and the peel-away sheath was removed.  A spot film was performed to confirm the position. The catheter was then attached to the port and the port placed in subcutaneous pocket. Adequate positioning was confirmed by fluoroscopy. Hemostasis was confirmed, and the port was secured with 2-0 prolene sutures.  Good backflow of blood was noted on aspiration of the port. The port was flushed with heparin flush. The port was visualized and noted to be in good position. The catheter was followed out to the port and in the anterior posterior direction the catheter bends back into the clavicle. Visually I did not see kink but more of a soft curve backward. The fluoroscopy machine was repositioned and the curve was noted to be soft and not an abort bend.   Subcutaneous layer was reapproximated using a 3-0 Vicryl interrupted suture. The skin was closed using a 4-0 Vicryl subcuticular suture. Dermabond was applied.  All tape and needle counts were correct at the end of the procedure. The patient was transferred to PACU in stable condition. A chest x-ray will be performed at that time.  Curlene Labrum, MD [facility] [address] [place], [place] [address] [phone] (office)

## 2019-02-15 NOTE — Interval H&P Note (Signed)
History and Physical Interval Note:  02/15/2019 10:30 AM  Andrea Simon  has presented today for surgery, with the diagnosis of colon cancer  The various methods of treatment have been discussed with the patient and family. After consideration of risks, benefits and other options for treatment, the patient has consented to  Procedure(s): INSERTION PORT-A-CATH (Left) as a surgical intervention .  [name] history has been reviewed, patient examined, no change in status, stable for surgery.  I have reviewed the patient's chart and labs.  Questions were answered to the patient's satisfaction.    Will start with left side.   Virl Cagey

## 2019-02-15 NOTE — Transfer of Care (Signed)
Immediate Anesthesia Transfer of Care Note  Patient: Andrea Simon  Procedure(s) Performed: INSERTION PORT-A-CATH (Left Chest)  Patient Location: PACU  Anesthesia Type:MAC  Level of Consciousness: awake, alert  and patient cooperative  Airway & Oxygen Therapy: Patient Spontanous Breathing  Post-op Assessment: Report given to RN and Post -op Vital signs reviewed and stable  Post vital signs: Reviewed and stable  Last Vitals:  Vitals Value Taken Time  BP 131/76 02/15/2019 11:42 AM  Temp    Pulse 60 02/15/2019 11:43 AM  Resp 18 02/15/2019 11:43 AM  SpO2 98 % 02/15/2019 11:43 AM  Vitals shown include unvalidated device data.  Last Pain:  Vitals:   02/15/19 0833  TempSrc: Oral  PainSc: 0-No pain      Patients Stated Pain Goal: 8 (63/81/77 1165)  Complications: No apparent anesthesia complications

## 2019-02-15 NOTE — Anesthesia Postprocedure Evaluation (Signed)
Anesthesia Post Note  Patient: Andrea Simon  Procedure(s) Performed: INSERTION PORT-A-CATH (Left Chest)  Patient location during evaluation: PACU Anesthesia Type: MAC Level of consciousness: awake and alert and patient cooperative Pain management: satisfactory to patient Vital Signs Assessment: vitals unstable Respiratory status: spontaneous breathing Cardiovascular status: stable Postop Assessment: no apparent nausea or vomiting Anesthetic complications: no     Last Vitals:  Vitals:   02/15/19 1200 02/15/19 1215  BP: (!) 145/78 138/70  Pulse: (!) 56 (!) 56  Resp: 19 19  Temp:    SpO2: 100% 100%    Last Pain:  Vitals:   02/15/19 1200  TempSrc:   PainSc: 0-No pain                 Reo Portela

## 2019-02-15 NOTE — Anesthesia Procedure Notes (Signed)
Procedure Name: MAC Date/Time: 02/15/2019 10:33 AM Performed by: Vista Deck, CRNA Pre-anesthesia Checklist: Patient identified, Emergency Drugs available, Suction available, Timeout performed and Patient being monitored Patient Re-evaluated:Patient Re-evaluated prior to induction Oxygen Delivery Method: Nasal Cannula

## 2019-02-15 NOTE — Discharge Instructions (Signed)
Keep area clean and dry. You can take a shower in 24 hours. Do not submerge in water.  Take tylenol and ibuprofen for pain control and take some of your Roxicodone if needed for severe pain.    Implanted Port Insertion, Care After This sheet gives you information about how to care for yourself after your procedure. Your health care provider may also give you more specific instructions. If you have problems or questions, contact your health care provider. What can I expect after the procedure? After the procedure, it is common to have:  Discomfort at the port insertion site.  Bruising on the skin over the port. This should improve over 3-4 days. Follow these instructions at home: [facility] care  After your port is placed, you will get a manufacturer's information card. The card has information about your port. Keep this card with you at all times.  Take care of the port as told by your health care provider. Ask your health care provider if you or a family member can get training for taking care of the port at home. A home health care nurse may also take care of the port.  Make sure to remember what type of port you have. Incision care      Follow instructions from your health care provider about how to take care of your port insertion site. Make sure you: ? Wash your hands with soap and water before and after you change your bandage (dressing). If soap and water are not available, use hand sanitizer. ? Change your dressing as told by your health care provider. ? Leave stitches (sutures), skin glue, or adhesive strips in place. These skin closures may need to stay in place for 2 weeks or longer. If adhesive strip edges start to loosen and curl up, you may trim the loose edges. Do not remove adhesive strips completely unless your health care provider tells you to do that.  Check your port insertion site every day for signs of infection. Check for: ? Redness, swelling, or pain. ? Fluid or  blood. ? Warmth. ? Pus or a bad smell. Activity  Return to your normal activities as told by your health care provider. Ask your health care provider what activities are safe for you.  Do not lift anything that is heavier than 10 lb (4.5 kg), or the limit that you are told, until your health care provider says that it is safe. General instructions  Take over-the-counter and prescription medicines only as told by your health care provider.  Do not take baths, swim, or use a hot tub until your health care provider approves. You may shower.  Do not drive for 24 hours if you were given a sedative during your procedure.  Wear a medical alert bracelet in case of an emergency. This will tell any health care providers that you have a port.  Keep all follow-up visits as told by your health care provider. This is important. Contact a health care provider if:  You cannot flush your port with saline as directed, or you cannot draw blood from the port.  You have a fever or chills.  You have redness, swelling, or pain around your port insertion site.  You have fluid or blood coming from your port insertion site.  Your port insertion site feels warm to the touch.  You have pus or a bad smell coming from the port insertion site. Get help right away if:  You have chest pain or shortness of  breath.  You have bleeding from your port that you cannot control. Summary  Take care of the port as told by your health care provider. Keep the manufacturer's information card with you at all times.  Change your dressing as told by your health care provider.  Contact a health care provider if you have a fever or chills or if you have redness, swelling, or pain around your port insertion site.  Keep all follow-up visits as told by your health care provider. This information is not intended to replace advice given to you by your health care provider. Make sure you discuss any questions you have with  your health care provider. Document Released: 09/27/2013 Document Revised: 07/05/2018 Document Reviewed: 07/05/2018 Elsevier Interactive Patient Education  2019 [place] POST-ANESTHESIA  IMMEDIATELY FOLLOWING SURGERY:  Do not drive or operate machinery for the first twenty four hours after surgery.  Do not make any important decisions for twenty four hours after surgery or while taking narcotic pain medications or sedatives.  If you develop intractable nausea and vomiting or a severe headache please notify your doctor immediately.  FOLLOW-UP:  Please make an appointment with your surgeon as instructed. You do not need to follow up with anesthesia unless specifically instructed to do so.  WOUND CARE INSTRUCTIONS (if applicable):  Keep a dry clean dressing on the anesthesia/puncture wound site if there is drainage.  Once the wound has quit draining you may leave it open to air.  Generally you should leave the bandage intact for twenty four hours unless there is drainage.  If the epidural site drains for more than 36-48 hours please call the anesthesia department.  QUESTIONS?:  Please feel free to call your physician or the hospital operator if you have any questions, and they will be happy to assist you.

## 2019-02-15 NOTE — Progress Notes (Signed)
[facility]  CXR reviewed. Looks good. The curve at the clavicle looks much softer on this image.    Curlene Labrum, MD [facility] [address] [place], [place] [address] [phone] (office)

## 2019-02-15 NOTE — Anesthesia Preprocedure Evaluation (Signed)
Anesthesia Evaluation  Patient identified by MRN, date of birth, ID band Patient awake    Reviewed: Allergy & Precautions, H&P , NPO status , Patient's Chart, lab work & pertinent test results, reviewed documented beta blocker date and time   Airway Mallampati: II  TM Distance: >3 FB Neck ROM: full    Dental  (+) Edentulous Upper, Missing, Partial Lower   Pulmonary neg pulmonary ROS, Current Smoker,    Pulmonary exam normal breath sounds clear to auscultation       Cardiovascular Exercise Tolerance: Good negative cardio ROS   Rhythm:regular Rate:Normal     Neuro/Psych PSYCHIATRIC DISORDERS Depression negative neurological ROS     GI/Hepatic negative GI ROS, Neg liver ROS,   Endo/Other  diabetes  Renal/GU negative Renal ROS  negative genitourinary   Musculoskeletal   Abdominal   Peds  Hematology negative hematology ROS (+)   Anesthesia Other Findings Malignant neoplasm of transverse colon  Reproductive/Obstetrics negative OB ROS                             Anesthesia Physical Anesthesia Plan  ASA: III  Anesthesia Plan: MAC   Post-op Pain Management:    Induction:   PONV Risk Score and Plan:   Airway Management Planned:   Additional Equipment:   Intra-op Plan:   Post-operative Plan:   Informed Consent: I have reviewed the patients History and Physical, chart, labs and discussed the procedure including the risks, benefits and alternatives for the proposed anesthesia with the patient or authorized representative who has indicated his/her understanding and acceptance.     Dental Advisory Given  Plan Discussed with: CRNA  Anesthesia Plan Comments:         Anesthesia Quick Evaluation

## 2019-02-16 ENCOUNTER — Encounter (HOSPITAL_COMMUNITY): Payer: Self-pay | Admitting: General Surgery

## 2019-02-16 ENCOUNTER — Inpatient Hospital Stay (HOSPITAL_COMMUNITY): Payer: 59

## 2019-02-16 DIAGNOSIS — C184 Malignant neoplasm of transverse colon: Secondary | ICD-10-CM

## 2019-02-21 ENCOUNTER — Inpatient Hospital Stay (HOSPITAL_COMMUNITY): Payer: 59

## 2019-02-22 ENCOUNTER — Encounter (HOSPITAL_COMMUNITY): Payer: Self-pay | Admitting: Hematology

## 2019-02-22 ENCOUNTER — Other Ambulatory Visit: Payer: Self-pay

## 2019-02-22 ENCOUNTER — Inpatient Hospital Stay (HOSPITAL_COMMUNITY): Payer: 59 | Attending: Hematology | Admitting: Hematology

## 2019-02-22 DIAGNOSIS — C184 Malignant neoplasm of transverse colon: Secondary | ICD-10-CM | POA: Insufficient documentation

## 2019-02-22 DIAGNOSIS — R5383 Other fatigue: Secondary | ICD-10-CM | POA: Insufficient documentation

## 2019-02-22 DIAGNOSIS — Z79899 Other long term (current) drug therapy: Secondary | ICD-10-CM | POA: Diagnosis not present

## 2019-02-22 DIAGNOSIS — E785 Hyperlipidemia, unspecified: Secondary | ICD-10-CM | POA: Insufficient documentation

## 2019-02-22 DIAGNOSIS — D7589 Other specified diseases of blood and blood-forming organs: Secondary | ICD-10-CM | POA: Diagnosis not present

## 2019-02-22 DIAGNOSIS — Z7982 Long term (current) use of aspirin: Secondary | ICD-10-CM | POA: Diagnosis not present

## 2019-02-22 DIAGNOSIS — F17210 Nicotine dependence, cigarettes, uncomplicated: Secondary | ICD-10-CM | POA: Insufficient documentation

## 2019-02-22 DIAGNOSIS — Z452 Encounter for adjustment and management of vascular access device: Secondary | ICD-10-CM | POA: Insufficient documentation

## 2019-02-22 DIAGNOSIS — Z5111 Encounter for antineoplastic chemotherapy: Secondary | ICD-10-CM | POA: Diagnosis present

## 2019-02-22 NOTE — Progress Notes (Signed)
Oncology navigator note:  I met with patient and her husband after the office visit with Dr. Delton Coombes.  I provided patient with my contact information and explained how I would be involved in her care.  I gave patient and her husband time to ask questions or voice any concerns.  They were answered to patient's satisfaction.

## 2019-02-22 NOTE — Progress Notes (Signed)
[place],  [id number]   CLINIC:  Medical Oncology/Hematology  PCP:  Fayrene Helper, MD [address] [place] [address] [phone]   REASON FOR VISIT: Follow-up for Stage 3 colon cancer  CURRENT THERAPY: folfox every 2 weeks  BRIEF ONCOLOGIC HISTORY:    Colon cancer ([place])   01/09/2019 Initial Diagnosis    Colon cancer ([place])    02/07/2019 -  Chemotherapy    The patient had palonosetron (ALOXI) injection 0.25 mg, 0.25 mg, Intravenous,  Once, 0 of 12 cycles leucovorin 640 mg in dextrose 5 % 250 mL infusion, 400 mg/m2, Intravenous,  Once, 0 of 12 cycles oxaliplatin (ELOXATIN) 135 mg in dextrose 5 % 500 mL chemo infusion, 85 mg/m2, Intravenous,  Once, 0 of 12 cycles fluorouracil (ADRUCIL) chemo injection 650 mg, 400 mg/m2, Intravenous,  Once, 0 of 12 cycles fluorouracil (ADRUCIL) 3,850 mg in sodium chloride 0.9 % 73 mL chemo infusion, 2,400 mg/m2, Intravenous, 1 Day/Dose, 0 of 12 cycles  for chemotherapy treatment.        INTERVAL HISTORY:  Ms. Gam[place]ill 63 y.o. female returns for routine follow-up for colon cancer. She is here today with her husband. She had her port placed a week ago. It is healing well with no problems. We will print off her schedule and start treatment next week. Denies any nausea, vomiting, or diarrhea. Denies any new pains. Had not noticed any recent bleeding such as epistaxis, hematuria or hematochezia. Denies recent chest pain on exertion, shortness of breath on minimal exertion, pre-syncopal episodes, or palpitations. Denies any numbness or tingling in hands or feet. Denies any recent fevers, infections, or recent hospitalizations. Patient reports appetite at 75% and energy level at 75%. She is eating well and maintaining her weight at this time.    REVIEW OF SYSTEMS:  Review of Systems  Constitutional: Positive for fatigue.  Psychiatric/Behavioral: The patient is nervous/anxious.   All  other systems reviewed and are negative.    PAST MEDICAL/SURGICAL HISTORY:  Past Medical History:  Diagnosis Date  . Hyperlipidemia   . Multinodular goiter   . Prediabetes    Past Surgical History:  Procedure Laterality Date  . BIOPSY  12/07/2018   Procedure: BIOPSY;  Surgeon: Rogene Houston, MD;  [facility];  Service: Endoscopy;;  sigmoid colon  . COLON RESECTION N/A 01/09/2019   Procedure: LAPAROSCOPIC RIGHT HEMICOLECTOMY;  Surgeon: Virl Cagey, MD;  Location: [facility] ORS;  Service: General;  Laterality: N/A;  . COLONOSCOPY N/A 12/07/2018   Procedure: COLONOSCOPY;  Surgeon: Rogene Houston, MD;  Location: [facility];  Service: Endoscopy;  Laterality: N/A;  12:45  . DILATION AND CURETTAGE OF UTERUS  2006  . FNA thyroid  2011 and 2019   benign  . POLYPECTOMY  12/07/2018   Procedure: POLYPECTOMY;  Surgeon: Rogene Houston, MD;  Location: [facility];  Service: Endoscopy;;  splenic flexure (CS x1)  . PORTACATH PLACEMENT Left 02/15/2019   Procedure: INSERTION PORT-A-CATH;  Surgeon: Virl Cagey, MD;  Location: [facility] ORS;  Service: General;  Laterality: Left;  . TUBAL LIGATION  1992     SOCIAL HISTORY:  Social History   Socioeconomic History  . Marital status: Married    Spouse name: Not on file  . Number of children: 1  . Years of education: Not on file  . Highest education level: Not on file  Occupational History  . Occupation: [employer]  Social Needs  . Financial resource strain: Not on file  . Food insecurity:    Worry: Not on file    Inability: Not on file  . Transportation needs:    Medical: Not on file    Non-medical: Not on file  Tobacco Use  . Smoking status: Current Every Day Smoker    Packs/day: 0.75    Years: 45.00    Pack years: 33.75    Types: Cigarettes  . Smokeless tobacco: Never Used  Substance and Sexual Activity  . Alcohol use: Yes    Alcohol/week: 0.0 standard drinks    Comment: occasionally  . Drug  use: No  . Sexual activity: Yes  Lifestyle  . Physical activity:    Days per week: Not on file    Minutes per session: Not on file  . Stress: Not on file  Relationships  . Social connections:    Talks on phone: Not on file    Gets together: Not on file    Attends religious service: Not on file    Active member of club or organization: Not on file    Attends meetings of clubs or organizations: Not on file    Relationship status: Not on file  . Intimate partner violence:    Fear of current or ex partner: Not on file    Emotionally abused: Not on file    Physically abused: Not on file    Forced sexual activity: Not on file  Other Topics Concern  . Not on file  Social History Narrative  . Not on file    FAMILY HISTORY:  Family History  Problem Relation Age of Onset  . Hypertension Mother        AAA  . Coronary artery disease Mother   . Hyperlipidemia Mother   . Cancer Mother        lung  . Hypertension Father   . Cancer Brother     CURRENT MEDICATIONS:  Outpatient Encounter Medications as of 02/22/2019  Medication Sig Note  . acetaminophen (TYLENOL) 500 MG tablet Take 1,000 mg by mouth every 6 (six) hours as needed for moderate pain.    Marland Kitchen ALPRAZolam (XANAX) 0.25 MG tablet Take one tablet at bedtime, as needed , for anxiety (Patient taking differently: Take 0.25 mg by mouth at bedtime as needed for anxiety. Take one tablet at bedtime, as needed , for anxiety)   . aspirin EC 81 MG tablet Take 1 tablet (81 mg total) by mouth every evening.   . Calcium Carbonate-Vitamin D (CALCIUM 600+D) 600-400 MG-UNIT tablet Take 1 tablet by mouth 2 (two) times a week. Sunday and Wednesday evenings   . docusate sodium (COLACE) 100 MG capsule Take 1 capsule (100 mg total) by mouth 2 (two) times daily.   . pravastatin (PRAVACHOL) 40 MG tablet TAKE 1 TABLET BY MOUTH  DAILY IN THE EVENING (Patient taking differently: Take 40 mg by mouth 4 (four) times a week. )   . fluorouracil CALGB 62694 in  sodium chloride 0.9 % 150 mL Inject into the vein 2 days. Every 14 days 02/02/2019: Has not started yet  . LEUCOVORIN CALCIUM IV Inject into the vein every 14 (fourteen) days. 02/02/2019: Has not started yet  . lidocaine-prilocaine (EMLA) cream Apply to port a cath site one hour prior to chemotherapy appointment to numb the skin over your port site. 02/02/2019: Has not started yet  . ondansetron (ZOFRAN-ODT) 4 MG disintegrating tablet Take 1 tablet (4 mg total) by mouth every  6 (six) hours as needed for nausea.   . OXALIPLATIN IV Inject into the vein every 14 (fourteen) days. 02/02/2019: Has not started yet  . pantoprazole (PROTONIX) 40 MG tablet Take 1 tablet (40 mg total) by mouth daily. (Patient not taking: Reported on 02/22/2019)   . prochlorperazine (COMPAZINE) 10 MG tablet Take 1 tablet (10 mg total) by mouth every 6 (six) hours as needed (Nausea or vomiting). 02/02/2019: Has not picked up yet   No facility-administered encounter medications on file as of 02/22/2019.     ALLERGIES:  No Known Allergies   PHYSICAL EXAM:  ECOG Performance status: 1  Vitals:   02/22/19 0905  BP: 138/74  Pulse: 68  Resp: 18  Temp: 98.3 F (36.8 C)  SpO2: 100%   Filed Weights   02/22/19 0905  Weight: 126 lb 6.4 oz (57.3 kg)    Physical Exam Constitutional:      Appearance: Normal appearance. She is normal weight.  Musculoskeletal: Normal range of motion.  Skin:    General: Skin is warm and dry.  Neurological:     Mental Status: She is alert and oriented to person, place, and time. Mental status is at baseline.  Psychiatric:        Mood and Affect: Mood normal.        Behavior: Behavior normal.        Thought Content: Thought content normal.        Judgment: Judgment normal.   Port site on the left chest wall is well-healed.   LABORATORY DATA:  I have reviewed the labs as listed.  CBC    Component Value Date/Time   WBC 5.3 01/12/2019 0547   RBC 3.07 (L) 01/12/2019 0547   HGB 11.0 (L)  01/12/2019 0547   HGB 13.5 08/18/2018 0758   HCT 32.8 (L) 01/12/2019 0547   HCT 37.2 08/18/2018 0758   PLT 198 01/12/2019 0547   PLT 259 08/18/2018 0758   MCV 106.8 (H) 01/12/2019 0547   MCV 104 (H) 08/18/2018 0758   MCH 35.8 (H) 01/12/2019 0547   MCHC 33.5 01/12/2019 0547   RDW 13.7 01/12/2019 0547   RDW 13.0 08/18/2018 0758   LYMPHSABS 1.6 01/12/2019 0547   LYMPHSABS 3.5 (H) 08/31/2017 0955   MONOABS 0.4 01/12/2019 0547   EOSABS 0.1 01/12/2019 0547   EOSABS 0.1 08/31/2017 0955   BASOSABS 0.0 01/12/2019 0547   BASOSABS 0.0 08/31/2017 0955   CMP Latest Ref Rng & Units 01/12/2019 01/11/2019 01/10/2019  Glucose 70 - 99 mg/dL 87 116(H) 91  BUN 8 - 23 mg/dL 8 5(L) 5(L)  Creatinine 0.44 - 1.00 mg/dL 0.80 0.87 0.86  Sodium 135 - 145 mmol/L 140 138 140  Potassium 3.5 - 5.1 mmol/L 3.6 3.9 3.5  Chloride 98 - 111 mmol/L 106 103 106  CO2 22 - 32 mmol/L '25 27 27  ' Calcium 8.9 - 10.3 mg/dL 8.9 9.1 8.6(L)  Total Protein 6.5 - 8.1 g/dL - - -  Total Bilirubin 0.3 - 1.2 mg/dL - - -  Alkaline Phos 38 - 126 U/L - - -  AST 15 - 41 U/L - - -  ALT 0 - 44 U/L - - -       DIAGNOSTIC IMAGING:  I have independently reviewed the scans and discussed with the patient.   I have reviewed Francene Finders, NP's note and agree with the documentation.  I personally performed a face-to-face visit, made revisions and my assessment and plan is as follows.  ASSESSMENT & PLAN:   Malignant neoplasm of transverse colon ([place]) 1.  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  Biopsy was consistent with invasive poorly differentiated adenocarcinoma.  Small polyp was found in the proximal sigmoid colon which was sessile.  External hemorrhoids were present. -CT of the abdomen and pelvis on 12/16/2018 showing a 4.2 x 2.9 cm near circumferential apple core type mass involving the  proximal transverse colon with a few small lymph nodes noted in the pericolonic fat.  No retroperitoneal adenopathy or evidence of liver mets. -Laparoscopic right colectomy on 01/09/2019, pathology showing 4 cm moderately differentiated invasive adenocarcinoma, grade 2, 1/25 lymph nodes positive, no tumor deposits, no perineural invasion.  Tumor extends through serosa into adherent omentum.  Margins are negative.  - MMR protein IHC shows loss of nuclear expression of MLH1.  MSI was high by PCR.  BRAF testing was negative. - PET CT scan on 01/31/2019 shows focal hypermetabolic activity identified at the ileocolic anastomosis with SUV max of 9.  This could be related to postsurgical changes, but follow-up as needed.  Low-level FDG uptake in the midline scar likely related to healing. -CEA level on 01/18/2019 was mildly elevated at 5.1. - Port was placed on 02/15/2019. - In light of MSI-high profile by PCR and absence of BRAF mutation, MLH1 promoter hyper methylation testing is indicated to further define the risk of Lynch syndrome. -We discussed about starting her on adjuvant FOLFOX therapy.  We talked about side effects and schedule in detail.  We will tentatively start her on next Monday.  2.  Macrocytosis: -She had macrocytosis with an MCV of 106. -I have checked her Q11, folic acid and TSH levels which were within normal limits.  We will continue to monitor closely.       Orders placed this encounter:  No orders of the defined types were placed in this encounter.     Derek Jack, MD [place] [phone]

## 2019-02-22 NOTE — Patient Instructions (Signed)
[place] at [facility] Discharge Instructions You were seen today by Dr. Delton Coombes, he went over your chemo therapy medications and their side effects. He explained that you will get blood work prior to your treatment every time you come. He explained that you will go home with a pump that you will need to have removed a few days after. Dr. Raliegh Ip stressed the importance of nutrition during treatments and also discussed the nausea medication that he has sent to your pharmacy. Keep imodium with you to help treat diarrhea, 2 pills at the first onset followed by 1 pill after every watery bowel movement. Drink plenty of water during treatments as well. We will get you scheduled to start your treatments next week. He is going to send off for special genetic testing. You may take OTC allergy medication as needed.    Thank you for choosing [place] at [facility] to provide your oncology and hematology care.  To afford each patient quality time with our provider, please arrive at least 15 minutes before your scheduled appointment time.   If you have a lab appointment with the [facility] please come in thru the  Main Entrance and check in at the main information desk  You need to re-schedule your appointment should you arrive 10 or more minutes late.  We strive to give you quality time with our providers, and arriving late affects you and other patients whose appointments are after yours.  Also, if you no show three or more times for appointments you may be dismissed from the clinic at the providers discretion.     Again, thank you for choosing [facility].  Our hope is that these requests will decrease the amount of time that you wait before being seen by our physicians.       _____________________________________________________________  Should you have questions after your visit to [facility], please contact our office at [phone]  [phone] between the hours of 8:00 a.m. and 4:30 p.m.  Voicemails left after 4:00 p.m. will not be returned until the following business day.  For prescription refill requests, have your pharmacy contact our office and allow 72 hours.    Cancer Center Support Programs:   > Cancer Support Group  2nd Tuesday of the month 1pm-2pm, Journey Room

## 2019-02-22 NOTE — Assessment & Plan Note (Addendum)
1.  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  Biopsy was consistent with invasive poorly differentiated adenocarcinoma.  Small polyp was found in the proximal sigmoid colon which was sessile.  External hemorrhoids were present. -CT of the abdomen and pelvis on 12/16/2018 showing a 4.2 x 2.9 cm near circumferential apple core type mass involving the proximal transverse colon with a few small lymph nodes noted in the pericolonic fat.  No retroperitoneal adenopathy or evidence of liver mets. -Laparoscopic right colectomy on 01/09/2019, pathology showing 4 cm moderately differentiated invasive adenocarcinoma, grade 2, 1/25 lymph nodes positive, no tumor deposits, no perineural invasion.  Tumor extends through serosa into adherent omentum.  Margins are negative.  - MMR protein IHC shows loss of nuclear expression of MLH1.  MSI was high by PCR.  BRAF testing was negative. - PET CT scan on 01/31/2019 shows focal hypermetabolic activity identified at the ileocolic anastomosis with SUV max of 9.  This could be related to postsurgical changes, but follow-up as needed.  Low-level FDG uptake in the midline scar likely related to healing. -CEA level on 01/18/2019 was mildly elevated at 5.1. - Port was placed on 02/15/2019. - In light of MSI-high profile by PCR and absence of BRAF mutation, MLH1 promoter hyper methylation testing is indicated to further define the risk of Lynch syndrome. -We discussed about starting her on adjuvant FOLFOX therapy.  We talked about side effects and schedule in detail.  We will tentatively start her on next Monday.  2.  Macrocytosis: -She had macrocytosis with an MCV of 106. -I have checked her W25, folic acid and TSH levels which were within normal limits.  We will continue to monitor closely.

## 2019-02-24 ENCOUNTER — Encounter (HOSPITAL_COMMUNITY): Payer: Self-pay | Admitting: *Deleted

## 2019-02-24 ENCOUNTER — Other Ambulatory Visit (HOSPITAL_COMMUNITY): Payer: Self-pay | Admitting: Hematology

## 2019-02-24 NOTE — Progress Notes (Signed)
I spoke with Suanne Marker in pathology and ordered MLH1 promoter hyper methylation on accession # [id number].

## 2019-02-27 ENCOUNTER — Ambulatory Visit (HOSPITAL_COMMUNITY): Payer: 59

## 2019-02-27 ENCOUNTER — Other Ambulatory Visit (HOSPITAL_COMMUNITY): Payer: 59

## 2019-03-01 ENCOUNTER — Other Ambulatory Visit: Payer: Self-pay

## 2019-03-01 ENCOUNTER — Inpatient Hospital Stay (HOSPITAL_COMMUNITY): Payer: 59

## 2019-03-01 ENCOUNTER — Encounter (HOSPITAL_COMMUNITY): Payer: Self-pay

## 2019-03-01 ENCOUNTER — Encounter (HOSPITAL_COMMUNITY): Payer: 59

## 2019-03-01 VITALS — BP 124/60 | HR 65 | Temp 97.8°F | Resp 18 | Wt 127.0 lb

## 2019-03-01 DIAGNOSIS — C184 Malignant neoplasm of transverse colon: Secondary | ICD-10-CM

## 2019-03-01 DIAGNOSIS — Z452 Encounter for adjustment and management of vascular access device: Secondary | ICD-10-CM | POA: Diagnosis not present

## 2019-03-01 LAB — COMPREHENSIVE METABOLIC PANEL
ALT: 20 U/L (ref 0–44)
AST: 23 U/L (ref 15–41)
Albumin: 4.7 g/dL (ref 3.5–5.0)
Alkaline Phosphatase: 64 U/L (ref 38–126)
Anion gap: 7 (ref 5–15)
BUN: 15 mg/dL (ref 8–23)
CALCIUM: 10.1 mg/dL (ref 8.9–10.3)
CO2: 26 mmol/L (ref 22–32)
CREATININE: 0.68 mg/dL (ref 0.44–1.00)
Chloride: 104 mmol/L (ref 98–111)
GFR calc Af Amer: >60 mL/min (ref >60)
GFR calc non Af Amer: >60 mL/min (ref >60)
Glucose, Bld: 94 mg/dL (ref 70–99)
Potassium: 4.4 mmol/L (ref 3.5–5.1)
Sodium: 137 mmol/L (ref 135–145)
Total Bilirubin: 0.9 mg/dL (ref 0.3–1.2)
Total Protein: 7.9 g/dL (ref 6.5–8.1)

## 2019-03-01 LAB — CBC WITH DIFFERENTIAL/PLATELET
Abs Immature Granulocytes: 0.01 10*3/uL (ref 0.00–0.07)
Basophils Absolute: 0 10*3/uL (ref 0.0–0.1)
Basophils Relative: 1 %
Eosinophils Absolute: 0 10*3/uL (ref 0.0–0.5)
Eosinophils Relative: 1 %
HEMATOCRIT: 39.9 % (ref 36.0–46.0)
Hemoglobin: 13.3 g/dL (ref 12.0–15.0)
IMMATURE GRANULOCYTES: 0 %
LYMPHS ABS: 2.4 10*3/uL (ref 0.7–4.0)
LYMPHS PCT: 45 %
MCH: 35.7 pg — ABNORMAL HIGH (ref 26.0–34.0)
MCHC: 33.3 g/dL (ref 30.0–36.0)
MCV: 107 fL — ABNORMAL HIGH (ref 80.0–100.0)
Monocytes Absolute: 0.3 10*3/uL (ref 0.1–1.0)
Monocytes Relative: 6 %
Neutro Abs: 2.5 10*3/uL (ref 1.7–7.7)
Neutrophils Relative %: 47 %
Platelets: 254 10*3/uL (ref 150–400)
RBC: 3.73 MIL/uL — ABNORMAL LOW (ref 3.87–5.11)
RDW: 14.4 % (ref 11.5–15.5)
WBC: 5.3 10*3/uL (ref 4.0–10.5)
nRBC: 0 % (ref 0.0–0.2)

## 2019-03-01 MED ORDER — SODIUM CHLORIDE 0.9 % IV SOLN
2400.0000 mg/m2 | INTRAVENOUS | Status: DC
  Administered 2019-03-01: 3850 mg via INTRAVENOUS
  Filled 2019-03-01: qty 77

## 2019-03-01 MED ORDER — FLUOROURACIL CHEMO INJECTION 2.5 GM/50ML
400.0000 mg/m2 | Freq: Once | INTRAVENOUS | Status: AC
  Administered 2019-03-01: 650 mg via INTRAVENOUS
  Filled 2019-03-01: qty 13

## 2019-03-01 MED ORDER — PALONOSETRON HCL INJECTION 0.25 MG/5ML
0.2500 mg | Freq: Once | INTRAVENOUS | Status: AC
  Administered 2019-03-01: 0.25 mg via INTRAVENOUS
  Filled 2019-03-01: qty 5

## 2019-03-01 MED ORDER — DEXTROSE 5 % IV SOLN
Freq: Once | INTRAVENOUS | Status: AC
  Administered 2019-03-01: 10:00:00 via INTRAVENOUS

## 2019-03-01 MED ORDER — OXALIPLATIN CHEMO INJECTION 100 MG/20ML
85.0000 mg/m2 | Freq: Once | INTRAVENOUS | Status: AC
  Administered 2019-03-01: 135 mg via INTRAVENOUS
  Filled 2019-03-01: qty 20

## 2019-03-01 MED ORDER — SODIUM CHLORIDE 0.9% FLUSH
10.0000 mL | INTRAVENOUS | Status: DC | PRN
  Administered 2019-03-01: 10 mL
  Filled 2019-03-01: qty 10

## 2019-03-01 MED ORDER — LEUCOVORIN CALCIUM INJECTION 350 MG
600.0000 mg | Freq: Once | INTRAVENOUS | Status: AC
  Administered 2019-03-01: 600 mg via INTRAVENOUS
  Filled 2019-03-01: qty 30

## 2019-03-01 MED ORDER — SODIUM CHLORIDE 0.9 % IV SOLN
10.0000 mg | Freq: Once | INTRAVENOUS | Status: AC
  Administered 2019-03-01: 10 mg via INTRAVENOUS
  Filled 2019-03-01: qty 1

## 2019-03-01 NOTE — Progress Notes (Signed)
Labs meet parameters for treatment today.  Continuous 5FU pump education done today. Gave patient spill kit and information sheet for pump education. Asked patient if she had any questions, she stated "no". Will call and check on her tomorrow.   Treatment given per orders. Patient tolerated it well without problems. Vitals stable and discharged home from clinic ambulatory. Follow up as scheduled.

## 2019-03-01 NOTE — Patient Instructions (Signed)
[facility] Discharge Instructions for Patients Receiving Chemotherapy  Today you received the following chemotherapy agents   To help prevent nausea and vomiting after your treatment, we encourage you to take your nausea medication   If you develop nausea and vomiting that is not controlled by your nausea medication, call the clinic.   BELOW ARE SYMPTOMS THAT SHOULD BE REPORTED IMMEDIATELY:  *FEVER GREATER THAN 100.5 F  *CHILLS WITH OR WITHOUT FEVER  NAUSEA AND VOMITING THAT IS NOT CONTROLLED WITH YOUR NAUSEA MEDICATION  *UNUSUAL SHORTNESS OF BREATH  *UNUSUAL BRUISING OR BLEEDING  TENDERNESS IN MOUTH AND THROAT WITH OR WITHOUT PRESENCE OF ULCERS  *URINARY PROBLEMS  *BOWEL PROBLEMS  UNUSUAL RASH Items with * indicate a potential emergency and should be followed up as soon as possible.  Feel free to call the clinic should you have any questions or concerns. The clinic phone number is [phone].  Please show the CHEMO ALERT CARD at check-in to the Emergency Department and triage nurse.   

## 2019-03-02 ENCOUNTER — Telehealth (HOSPITAL_COMMUNITY): Payer: Self-pay

## 2019-03-02 NOTE — Telephone Encounter (Signed)
Patient called today to let us know her fingers in both hands became stiff, could not bend them or use her hands for like "10 min". This only happened one time. She said she was holding the phone working when this happened. MD notified, no new instructions or orders at this time.

## 2019-03-03 ENCOUNTER — Inpatient Hospital Stay (HOSPITAL_COMMUNITY): Payer: 59

## 2019-03-03 ENCOUNTER — Other Ambulatory Visit: Payer: Self-pay

## 2019-03-03 VITALS — BP 130/68 | HR 73 | Temp 98.1°F | Resp 18

## 2019-03-03 DIAGNOSIS — C184 Malignant neoplasm of transverse colon: Secondary | ICD-10-CM

## 2019-03-03 DIAGNOSIS — Z452 Encounter for adjustment and management of vascular access device: Secondary | ICD-10-CM | POA: Diagnosis not present

## 2019-03-03 MED ORDER — SODIUM CHLORIDE 0.9% FLUSH
10.0000 mL | INTRAVENOUS | Status: DC | PRN
  Administered 2019-03-03: 10 mL
  Filled 2019-03-03: qty 10

## 2019-03-03 MED ORDER — HEPARIN SOD (PORK) LOCK FLUSH 100 UNIT/ML IV SOLN
500.0000 [IU] | Freq: Once | INTRAVENOUS | Status: AC | PRN
  Administered 2019-03-03: 500 [IU]

## 2019-03-03 NOTE — Progress Notes (Signed)
Mikael Spray presented for pump D/C and port flush.  Portacath located in the left chest wall.  Portacath flushed with 104ml NS and 500U/28ml Heparin per protocol and needle removed intact. Procedure without incident. Patient tolerated procedures.    Vital signs stable. No complaints at this time. Discharged from clinic ambulatory. F/U with [facility] as scheduled.

## 2019-03-03 NOTE — Patient Instructions (Signed)
[facility] Discharge Instructions for Patients Receiving Chemotherapy  Today you received the following chemotherapy agents   To help prevent nausea and vomiting after your treatment, we encourage you to take your nausea medication   If you develop nausea and vomiting that is not controlled by your nausea medication, call the clinic.   BELOW ARE SYMPTOMS THAT SHOULD BE REPORTED IMMEDIATELY:  *FEVER GREATER THAN 100.5 F  *CHILLS WITH OR WITHOUT FEVER  NAUSEA AND VOMITING THAT IS NOT CONTROLLED WITH YOUR NAUSEA MEDICATION  *UNUSUAL SHORTNESS OF BREATH  *UNUSUAL BRUISING OR BLEEDING  TENDERNESS IN MOUTH AND THROAT WITH OR WITHOUT PRESENCE OF ULCERS  *URINARY PROBLEMS  *BOWEL PROBLEMS  UNUSUAL RASH Items with * indicate a potential emergency and should be followed up as soon as possible.  Feel free to call the clinic should you have any questions or concerns. The clinic phone number is [phone].  Please show the CHEMO ALERT CARD at check-in to the Emergency Department and triage nurse.   

## 2019-03-06 ENCOUNTER — Telehealth (HOSPITAL_COMMUNITY): Payer: Self-pay

## 2019-03-06 NOTE — Telephone Encounter (Signed)
24 hour follow up . Patient is having some constipation but is taking a stool softener. No other complaints at this time.

## 2019-03-13 ENCOUNTER — Other Ambulatory Visit (HOSPITAL_COMMUNITY): Payer: 59

## 2019-03-13 ENCOUNTER — Ambulatory Visit (HOSPITAL_COMMUNITY): Payer: 59 | Admitting: Hematology

## 2019-03-13 ENCOUNTER — Ambulatory Visit (HOSPITAL_COMMUNITY): Payer: 59

## 2019-03-15 ENCOUNTER — Encounter (HOSPITAL_COMMUNITY): Payer: Self-pay

## 2019-03-15 ENCOUNTER — Other Ambulatory Visit: Payer: Self-pay

## 2019-03-15 ENCOUNTER — Encounter (HOSPITAL_COMMUNITY): Payer: 59

## 2019-03-15 ENCOUNTER — Inpatient Hospital Stay (HOSPITAL_BASED_OUTPATIENT_CLINIC_OR_DEPARTMENT_OTHER): Payer: 59 | Admitting: Hematology

## 2019-03-15 ENCOUNTER — Encounter (HOSPITAL_COMMUNITY): Payer: Self-pay | Admitting: Hematology

## 2019-03-15 ENCOUNTER — Inpatient Hospital Stay (HOSPITAL_COMMUNITY): Payer: 59

## 2019-03-15 VITALS — BP 122/70 | HR 72 | Temp 98.6°F | Resp 18

## 2019-03-15 DIAGNOSIS — D7589 Other specified diseases of blood and blood-forming organs: Secondary | ICD-10-CM

## 2019-03-15 DIAGNOSIS — Z7982 Long term (current) use of aspirin: Secondary | ICD-10-CM

## 2019-03-15 DIAGNOSIS — R5383 Other fatigue: Secondary | ICD-10-CM

## 2019-03-15 DIAGNOSIS — C184 Malignant neoplasm of transverse colon: Secondary | ICD-10-CM

## 2019-03-15 DIAGNOSIS — Z452 Encounter for adjustment and management of vascular access device: Secondary | ICD-10-CM | POA: Diagnosis not present

## 2019-03-15 DIAGNOSIS — E785 Hyperlipidemia, unspecified: Secondary | ICD-10-CM

## 2019-03-15 DIAGNOSIS — F17210 Nicotine dependence, cigarettes, uncomplicated: Secondary | ICD-10-CM

## 2019-03-15 DIAGNOSIS — Z79899 Other long term (current) drug therapy: Secondary | ICD-10-CM

## 2019-03-15 LAB — CBC WITH DIFFERENTIAL/PLATELET
Abs Immature Granulocytes: 0.01 10*3/uL (ref 0.00–0.07)
BASOS PCT: 1 %
Basophils Absolute: 0.1 10*3/uL (ref 0.0–0.1)
Eosinophils Absolute: 0.1 10*3/uL (ref 0.0–0.5)
Eosinophils Relative: 2 %
HCT: 35.3 % — ABNORMAL LOW (ref 36.0–46.0)
Hemoglobin: 12 g/dL (ref 12.0–15.0)
Immature Granulocytes: 0 %
Lymphocytes Relative: 44 %
Lymphs Abs: 2 10*3/uL (ref 0.7–4.0)
MCH: 34.7 pg — ABNORMAL HIGH (ref 26.0–34.0)
MCHC: 34 g/dL (ref 30.0–36.0)
MCV: 102 fL — ABNORMAL HIGH (ref 80.0–100.0)
Monocytes Absolute: 0.3 10*3/uL (ref 0.1–1.0)
Monocytes Relative: 7 %
NEUTROS PCT: 46 %
Neutro Abs: 2.1 10*3/uL (ref 1.7–7.7)
Platelets: 187 10*3/uL (ref 150–400)
RBC: 3.46 MIL/uL — AB (ref 3.87–5.11)
RDW: 13.8 % (ref 11.5–15.5)
WBC: 4.6 10*3/uL (ref 4.0–10.5)
nRBC: 0 % (ref 0.0–0.2)

## 2019-03-15 LAB — COMPREHENSIVE METABOLIC PANEL
ALT: 20 U/L (ref 0–44)
AST: 25 U/L (ref 15–41)
Albumin: 4.3 g/dL (ref 3.5–5.0)
Alkaline Phosphatase: 65 U/L (ref 38–126)
Anion gap: 6 (ref 5–15)
BUN: 10 mg/dL (ref 8–23)
CHLORIDE: 108 mmol/L (ref 98–111)
CO2: 25 mmol/L (ref 22–32)
Calcium: 9.6 mg/dL (ref 8.9–10.3)
Creatinine, Ser: 0.68 mg/dL (ref 0.44–1.00)
GFR calc Af Amer: >60 mL/min (ref >60)
GFR calc non Af Amer: >60 mL/min (ref >60)
Glucose, Bld: 128 mg/dL — ABNORMAL HIGH (ref 70–99)
Potassium: 3.3 mmol/L — ABNORMAL LOW (ref 3.5–5.1)
Sodium: 139 mmol/L (ref 135–145)
Total Bilirubin: 0.8 mg/dL (ref 0.3–1.2)
Total Protein: 7.1 g/dL (ref 6.5–8.1)

## 2019-03-15 MED ORDER — DEXTROSE 5 % IV SOLN
Freq: Once | INTRAVENOUS | Status: AC
  Administered 2019-03-15: 10:00:00 via INTRAVENOUS

## 2019-03-15 MED ORDER — FLUOROURACIL CHEMO INJECTION 2.5 GM/50ML
400.0000 mg/m2 | Freq: Once | INTRAVENOUS | Status: AC
  Administered 2019-03-15: 650 mg via INTRAVENOUS
  Filled 2019-03-15: qty 13

## 2019-03-15 MED ORDER — SODIUM CHLORIDE 0.9 % IV SOLN
10.0000 mg | Freq: Once | INTRAVENOUS | Status: AC
  Administered 2019-03-15: 10 mg via INTRAVENOUS
  Filled 2019-03-15: qty 10

## 2019-03-15 MED ORDER — OXALIPLATIN CHEMO INJECTION 100 MG/20ML
85.0000 mg/m2 | Freq: Once | INTRAVENOUS | Status: AC
  Administered 2019-03-15: 135 mg via INTRAVENOUS
  Filled 2019-03-15: qty 20

## 2019-03-15 MED ORDER — LEUCOVORIN CALCIUM INJECTION 350 MG
375.0000 mg/m2 | Freq: Once | INTRAVENOUS | Status: AC
  Administered 2019-03-15: 600 mg via INTRAVENOUS
  Filled 2019-03-15: qty 25

## 2019-03-15 MED ORDER — PALONOSETRON HCL INJECTION 0.25 MG/5ML
0.2500 mg | Freq: Once | INTRAVENOUS | Status: AC
  Administered 2019-03-15: 0.25 mg via INTRAVENOUS
  Filled 2019-03-15: qty 5

## 2019-03-15 MED ORDER — SODIUM CHLORIDE 0.9 % IV SOLN
2400.0000 mg/m2 | INTRAVENOUS | Status: DC
  Administered 2019-03-15: 3850 mg via INTRAVENOUS
  Filled 2019-03-15: qty 77

## 2019-03-15 MED ORDER — SODIUM CHLORIDE 0.9% FLUSH
10.0000 mL | INTRAVENOUS | Status: DC | PRN
  Administered 2019-03-15: 10 mL
  Filled 2019-03-15: qty 10

## 2019-03-15 NOTE — Assessment & Plan Note (Signed)
1.  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  Biopsy was consistent with invasive poorly differentiated adenocarcinoma.  Small polyp was found in the proximal sigmoid colon which was sessile.  External hemorrhoids were present. -CT of the abdomen and pelvis on 12/16/2018 showing a 4.2 x 2.9 cm near circumferential apple core type mass involving the proximal transverse colon with a few small lymph nodes noted in the pericolonic fat.  No retroperitoneal adenopathy or evidence of liver mets. -Laparoscopic right colectomy on 01/09/2019, pathology showing 4 cm moderately differentiated invasive adenocarcinoma, grade 2, 1/25 lymph nodes positive, no tumor deposits, no perineural invasion.  Tumor extends through serosa into adherent omentum.  Margins are negative.  - MMR protein IHC shows loss of nuclear expression of MLH1.  MSI was high by PCR.  BRAF testing was negative. - PET CT scan on 01/31/2019 shows focal hypermetabolic activity identified at the ileocolic anastomosis with SUV max of 9.  This could be related to postsurgical changes, but follow-up as needed.  Low-level FDG uptake in the midline scar likely related to healing. -CEA level on 01/18/2019 was mildly elevated at 5.1. - Adjuvant FOLFOX cycle 1 was started on 03/01/2019. -She experienced some nausea but denied any vomiting.  She also experienced some constipation but denied any diarrhea.  She had cold sensitivity lasting 2 to 3 days. -I have reviewed her blood work today.  She may proceed with cycle 2 without any dose modifications. -I will reevaluate her in 2 weeks for follow-up.   - MSI-high profile by PCR.  BRAF mutation was negative.  MLH1 hyper methylation was present.  Even though there have been a few reported cases of germline hyper methylation of the CPG islands on the MLH1 promoter region, the presence of  such hyper methylation has been documented in approximately 20% of sporadic colorectal cancers, which correlated with loss of MLH1 expression and high MSI.  Thus, tumors exhibiting IHC loss of MLH1 and the presence of MLH1 promoter methylation are presumed more likely to be a sporadic cancer.  2.  Macrocytosis: -She had macrocytosis with an MCV of 106. -I have checked her V14, folic acid and TSH levels which were within normal limits.  We will continue to monitor closely. -Her macrocytosis improved to 102 today.

## 2019-03-15 NOTE — Patient Instructions (Signed)
[facility] Discharge Instructions for Patients Receiving Chemotherapy  Today you received the following chemotherapy agents  If you develop nausea and vomiting that is not controlled by your nausea medication, call the clinic.   BELOW ARE SYMPTOMS THAT SHOULD BE REPORTED IMMEDIATELY:  *FEVER GREATER THAN 100.5 F  *CHILLS WITH OR WITHOUT FEVER  NAUSEA AND VOMITING THAT IS NOT CONTROLLED WITH YOUR NAUSEA MEDICATION  *UNUSUAL SHORTNESS OF BREATH  *UNUSUAL BRUISING OR BLEEDING  TENDERNESS IN MOUTH AND THROAT WITH OR WITHOUT PRESENCE OF ULCERS  *URINARY PROBLEMS  *BOWEL PROBLEMS  UNUSUAL RASH Items with * indicate a potential emergency and should be followed up as soon as possible.  Feel free to call the clinic should you have any questions or concerns. The clinic phone number is [phone].  Please show the CHEMO ALERT CARD at check-in to the Emergency Department and triage nurse.   

## 2019-03-15 NOTE — Patient Instructions (Signed)
[place] at [facility] Discharge Instructions  You were seen today by Dr. Delton Coombes. He went over your recent lab results. He will see you back in 2 weeks  for labs treatment, and follow up.   Thank you for choosing [place] at [facility] to provide your oncology and hematology care.  To afford each patient quality time with our provider, please arrive at least 15 minutes before your scheduled appointment time.   If you have a lab appointment with the [facility] please come in thru the  Main Entrance and check in at the main information desk  You need to re-schedule your appointment should you arrive 10 or more minutes late.  We strive to give you quality time with our providers, and arriving late affects you and other patients whose appointments are after yours.  Also, if you no show three or more times for appointments you may be dismissed from the clinic at the providers discretion.     Again, thank you for choosing [facility].  Our hope is that these requests will decrease the amount of time that you wait before being seen by our physicians.       _____________________________________________________________  Should you have questions after your visit to [facility], please contact our office at [phone] between the hours of 8:00 a.m. and 4:30 p.m.  Voicemails left after 4:00 p.m. will not be returned until the following business day.  For prescription refill requests, have your pharmacy contact our office and allow 72 hours.    Cancer Center Support Programs:   > Cancer Support Group  2nd Tuesday of the month 1pm-2pm, Journey Room

## 2019-03-15 NOTE — Progress Notes (Signed)
Patient seen by Dr. Delton Coombes with lab review and ok to treat today verbal order.    Patient tolerated chemotherapy with no complaints voiced.  Port site clean and dry with no bruising or swelling noted at site.  Good blood return noted before and after administration of chemotherapy.  Chemotherapy pump connected with no alarms noted.  Dressing intact.  Patient left ambulatory with VSS and no s/s of distress noted.

## 2019-03-15 NOTE — Progress Notes (Signed)
[place], [place] [address]   CLINIC:  Medical Oncology/Hematology  PCP:  Fayrene Helper, [place], [place] [address] [phone]   REASON FOR VISIT:  Follow-up for Stage 3 colon cancer  CURRENT THERAPY:folfox every 2 weeks    BRIEF ONCOLOGIC HISTORY:    Colon cancer ([place])   01/09/2019 Initial Diagnosis    Colon cancer ([place])    03/01/2019 -  Chemotherapy    The patient had palonosetron (ALOXI) injection 0.25 mg, 0.25 mg, Intravenous,  Once, 2 of 12 cycles Administration: 0.25 mg (03/01/2019) leucovorin 600 mg in dextrose 5 % 250 mL infusion, 640 mg, Intravenous,  Once, 2 of 12 cycles Administration: 600 mg (03/01/2019) oxaliplatin (ELOXATIN) 135 mg in dextrose 5 % 500 mL chemo infusion, 85 mg/m2 = 135 mg, Intravenous,  Once, 2 of 12 cycles Administration: 135 mg (03/01/2019) fluorouracil (ADRUCIL) chemo injection 650 mg, 400 mg/m2 = 650 mg, Intravenous,  Once, 2 of 12 cycles Administration: 650 mg (03/01/2019) fluorouracil (ADRUCIL) 3,850 mg in sodium chloride 0.9 % 73 mL chemo infusion, 2,400 mg/m2 = 3,850 mg, Intravenous, 1 Day/Dose, 2 of 12 cycles Administration: 3,850 mg (03/01/2019)  for chemotherapy treatment.       CANCER STAGING: Cancer Staging No matching staging information was found for the patient.   INTERVAL HISTORY:  Andrea Simon 63 y.o. female returns for routine follow-up and consideration for next cycle of chemotherapy. She is here today alone. She states that she has constipation, she is taking 2 colace tablets. She states that she is having indigestion, took tums but didn't get complete relief. She has noticed skin dryness as well as cold sensitivity. Denies any  vomiting, or diarrhea. Denies any new pains. Had not noticed any recent bleeding such as epistaxis, hematuria or hematochezia. Denies recent chest pain on exertion, shortness of breath on minimal exertion, pre-syncopal episodes,  or palpitations. Denies any numbness or tingling in hands or feet. Denies any recent fevers, infections, or recent hospitalizations. Patient reports appetite at 75% and energy level at 75%.   REVIEW OF SYSTEMS:  Review of Systems  Gastrointestinal: Positive for constipation and nausea.     PAST MEDICAL/SURGICAL HISTORY:  Past Medical History:  Diagnosis Date  . Hyperlipidemia   . Multinodular goiter   . Prediabetes    Past Surgical History:  Procedure Laterality Date  . BIOPSY  12/07/2018   Procedure: BIOPSY;  Surgeon: Rogene Houston, MD;  Location: [facility];  Service: Endoscopy;;  sigmoid colon  . COLON RESECTION N/A 01/09/2019   Procedure: LAPAROSCOPIC RIGHT HEMICOLECTOMY;  Surgeon: Virl Cagey, MD;  Location: [facility] ORS;  Service: General;  Laterality: N/A;  . COLONOSCOPY N/A 12/07/2018   Procedure: COLONOSCOPY;  Surgeon: Rogene Houston, MD;  Location: [facility];  Service: Endoscopy;  Laterality: N/A;  12:45  . DILATION AND CURETTAGE OF UTERUS  2006  . FNA thyroid  2011 and 2019   benign  . POLYPECTOMY  12/07/2018   Procedure: POLYPECTOMY;  Surgeon: Rogene Houston, MD;  Location: [facility];  Service: Endoscopy;;  splenic flexure (CS x1)  . PORTACATH PLACEMENT Left 02/15/2019   Procedure: INSERTION PORT-A-CATH;  Surgeon: Virl Cagey, MD;  Location: [facility] ORS;  Service: General;  Laterality: Left;  . TUBAL LIGATION  1992     SOCIAL HISTORY:  Social History   Socioeconomic History  . Marital status: Married    Spouse name: Not on  file  . Number of children: 1  . Years of education: Not on file  . Highest education level: Not on file  Occupational History  . Occupation: [employer]  . Financial resource strain: Not on file  . Food insecurity:    Worry: Not on file    Inability: Not on file  . Transportation needs:    Medical: Not on file    Non-medical: Not on file  Tobacco Use  . Smoking status: Current Every  Day Smoker    Packs/day: 0.75    Years: 45.00    Pack years: 33.75    Types: Cigarettes  . Smokeless tobacco: Never Used  Substance and Sexual Activity  . Alcohol use: Yes    Alcohol/week: 0.0 standard drinks    Comment: occasionally  . Drug use: No  . Sexual activity: Yes  Lifestyle  . Physical activity:    Days per week: Not on file    Minutes per session: Not on file  . Stress: Not on file  Relationships  . Social connections:    Talks on phone: Not on file    Gets together: Not on file    Attends religious service: Not on file    Active member of club or organization: Not on file    Attends meetings of clubs or organizations: Not on file    Relationship status: Not on file  . Intimate partner violence:    Fear of current or ex partner: Not on file    Emotionally abused: Not on file    Physically abused: Not on file    Forced sexual activity: Not on file  Other Topics Concern  . Not on file  Social History Narrative  . Not on file    FAMILY HISTORY:  Family History  Problem Relation Age of Onset  . Hypertension Mother        AAA  . Coronary artery disease Mother   . Hyperlipidemia Mother   . Cancer Mother        lung  . Hypertension Father   . Cancer Brother     CURRENT MEDICATIONS:  Outpatient Encounter Medications as of 03/15/2019  Medication Sig Note  . acetaminophen (TYLENOL) 500 MG tablet Take 1,000 mg by mouth every 6 (six) hours as needed for moderate pain.    Marland Kitchen ALPRAZolam (XANAX) 0.25 MG tablet Take one tablet at bedtime, as needed , for anxiety (Patient taking differently: Take 0.25 mg by mouth at bedtime as needed for anxiety. Take one tablet at bedtime, as needed , for anxiety)   . aspirin EC 81 MG tablet Take 1 tablet (81 mg total) by mouth every evening.   . Calcium Carbonate-Vitamin D (CALCIUM 600+D) 600-400 MG-UNIT tablet Take 1 tablet by mouth 2 (two) times a week. Sunday and Wednesday evenings   . docusate sodium (COLACE) 100 MG capsule Take  1 capsule (100 mg total) by mouth 2 (two) times daily.   . fluorouracil CALGB 07867 in sodium chloride 0.9 % 150 mL Inject into the vein 2 days. Every 14 days 02/02/2019: Has not started yet  . LEUCOVORIN CALCIUM IV Inject into the vein every 14 (fourteen) days. 02/02/2019: Has not started yet  . lidocaine-prilocaine (EMLA) cream Apply to port a cath site one hour prior to chemotherapy appointment to numb the skin over your port site. 02/02/2019: Has not started yet  . ondansetron (ZOFRAN-ODT) 4 MG disintegrating tablet Take 1 tablet (4 mg total)  by mouth every 6 (six) hours as needed for nausea.   . OXALIPLATIN IV Inject into the vein every 14 (fourteen) days. 02/02/2019: Has not started yet  . pantoprazole (PROTONIX) 40 MG tablet Take 1 tablet (40 mg total) by mouth daily.   . pravastatin (PRAVACHOL) 40 MG tablet TAKE 1 TABLET BY MOUTH  DAILY IN THE EVENING (Patient taking differently: Take 40 mg by mouth 4 (four) times a week. )   . prochlorperazine (COMPAZINE) 10 MG tablet Take 1 tablet (10 mg total) by mouth every 6 (six) hours as needed (Nausea or vomiting). 02/02/2019: Has not picked up yet   No facility-administered encounter medications on file as of 03/15/2019.     ALLERGIES:  No Known Allergies   PHYSICAL EXAM:  ECOG Performance status: 1  Vitals:   03/15/19 0824  BP: 111/66  Pulse: 74  Resp: 18  Temp: 98.7 F (37.1 C)  SpO2: 100%   Filed Weights   03/15/19 0824  Weight: 125 lb 9.6 oz (57 kg)    Physical Exam Constitutional:      Appearance: Normal appearance.  Cardiovascular:     Rate and Rhythm: Normal rate and regular rhythm.     Heart sounds: Normal heart sounds.  Pulmonary:     Effort: Pulmonary effort is normal.     Breath sounds: Normal breath sounds.  Abdominal:     Palpations: Abdomen is soft. There is no mass.  Musculoskeletal:        General: No swelling.  Skin:    General: Skin is warm.  Neurological:     General: No focal deficit present.      Mental Status: She is alert and oriented to person, place, and time.  Psychiatric:        Mood and Affect: Mood normal.        Behavior: Behavior normal.      LABORATORY DATA:  I have reviewed the labs as listed.  CBC    Component Value Date/Time   WBC 4.6 03/15/2019 0815   RBC 3.46 (L) 03/15/2019 0815   HGB 12.0 03/15/2019 0815   HGB 13.5 08/18/2018 0758   HCT 35.3 (L) 03/15/2019 0815   HCT 37.2 08/18/2018 0758   PLT 187 03/15/2019 0815   PLT 259 08/18/2018 0758   MCV 102.0 (H) 03/15/2019 0815   MCV 104 (H) 08/18/2018 0758   MCH 34.7 (H) 03/15/2019 0815   MCHC 34.0 03/15/2019 0815   RDW 13.8 03/15/2019 0815   RDW 13.0 08/18/2018 0758   LYMPHSABS 2.0 03/15/2019 0815   LYMPHSABS 3.5 (H) 08/31/2017 0955   MONOABS 0.3 03/15/2019 0815   EOSABS 0.1 03/15/2019 0815   EOSABS 0.1 08/31/2017 0955   BASOSABS 0.1 03/15/2019 0815   BASOSABS 0.0 08/31/2017 0955   CMP Latest Ref Rng & Units 03/15/2019 03/01/2019 01/12/2019  Glucose 70 - 99 mg/dL 128(H) 94 87  BUN 8 - 23 mg/dL _0 Creatinine 0.44 - 1.00 mg/dL 0.68 0.68 0.80  Sodium 135 - 145 mmol/L 139 137 140  Potassium 3.5 - 5.1 mmol/L 3.3(L) 4.4 3.6  Chloride 98 - 111 mmol/L 108 104 106  CO2 22 - 32 mmol/L _1 Calcium 8.9 - 10.3 mg/dL 9.6 10.1 8.9  Total Protein 6.5 - 8.1 g/dL 7.1 7.9 -  Total Bilirubin 0.3 - 1.2 mg/dL 0.8 0.9 -  Alkaline Phos 38 - 126 U/L 65 64 -  AST 15 - 41 U/L 25 23 -  ALT 0 -  44 U/L 20 20 -       DIAGNOSTIC IMAGING:  I have independently reviewed the scans and discussed with the patient.   I have reviewed Venita Lick LPN's note and agree with the documentation.  I personally performed a face-to-face visit, made revisions and my assessment and plan is as follows.    ASSESSMENT & PLAN:   Malignant neoplasm of transverse colon ([place]) 1.  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  Biopsy was consistent with invasive poorly differentiated adenocarcinoma.  Small polyp was found in the proximal sigmoid colon which was sessile.  External hemorrhoids were present. -CT of the abdomen and pelvis on 12/16/2018 showing a 4.2 x 2.9 cm near circumferential apple core type mass involving the proximal transverse colon with a few small lymph nodes noted in the pericolonic fat.  No retroperitoneal adenopathy or evidence of liver mets. -Laparoscopic right colectomy on 01/09/2019, pathology showing 4 cm moderately differentiated invasive adenocarcinoma, grade 2, 1/25 lymph nodes positive, no tumor deposits, no perineural invasion.  Tumor extends through serosa into adherent omentum.  Margins are negative.  - MMR protein IHC shows loss of nuclear expression of MLH1.  MSI was high by PCR.  BRAF testing was negative. - PET CT scan on 01/31/2019 shows focal hypermetabolic activity identified at the ileocolic anastomosis with SUV max of 9.  This could be related to postsurgical changes, but follow-up as needed.  Low-level FDG uptake in the midline scar likely related to healing. -CEA level on 01/18/2019 was mildly elevated at 5.1. - Adjuvant FOLFOX cycle 1 was started on 03/01/2019. -She experienced some nausea but denied any vomiting.  She also experienced some constipation but denied any diarrhea.  She had cold sensitivity lasting 2 to 3 days. -I have reviewed her blood work today.  She may proceed with cycle 2 without any dose modifications. -I will reevaluate her in 2 weeks for follow-up.   - MSI-high profile by PCR.  BRAF mutation was negative.  MLH1 hyper methylation was present.  Even though there have been a few reported cases of germline hyper methylation of the CPG islands on the MLH1 promoter region, the presence of such hyper methylation has been documented in approximately 20% of sporadic colorectal cancers, which correlated with loss of MLH1 expression and high  MSI.  Thus, tumors exhibiting IHC loss of MLH1 and the presence of MLH1 promoter methylation are presumed more likely to be a sporadic cancer.  2.  Macrocytosis: -She had macrocytosis with an MCV of 106. -I have checked her J62, folic acid and TSH levels which were within normal limits.  We will continue to monitor closely. -Her macrocytosis improved to 102 today.       Orders placed this encounter:  No orders of the defined types were placed in this encounter.     Derek Jack, MD [place] [phone]

## 2019-03-17 ENCOUNTER — Other Ambulatory Visit: Payer: Self-pay

## 2019-03-17 ENCOUNTER — Inpatient Hospital Stay (HOSPITAL_COMMUNITY): Payer: 59

## 2019-03-17 ENCOUNTER — Encounter (HOSPITAL_COMMUNITY): Payer: Self-pay

## 2019-03-17 VITALS — BP 120/65 | HR 76 | Temp 98.6°F | Resp 18

## 2019-03-17 DIAGNOSIS — C184 Malignant neoplasm of transverse colon: Secondary | ICD-10-CM

## 2019-03-17 DIAGNOSIS — Z452 Encounter for adjustment and management of vascular access device: Secondary | ICD-10-CM | POA: Diagnosis not present

## 2019-03-17 MED ORDER — SODIUM CHLORIDE 0.9% FLUSH
10.0000 mL | Freq: Once | INTRAVENOUS | Status: AC
  Administered 2019-03-17: 10 mL via INTRAVENOUS

## 2019-03-17 MED ORDER — HEPARIN SOD (PORK) LOCK FLUSH 100 UNIT/ML IV SOLN
500.0000 [IU] | Freq: Once | INTRAVENOUS | Status: AC
  Administered 2019-03-17: 500 [IU] via INTRAVENOUS

## 2019-03-17 NOTE — Patient Instructions (Signed)
[facility]  Discharge Instructions:   _______________________________________________________________  Thank you for choosing [facility] to provide your oncology and hematology care.  To afford each patient quality time with our providers, please arrive at least 15 minutes before your scheduled appointment.  You need to re-schedule your appointment if you arrive 10 or more minutes late.  We strive to give you quality time with our providers, and arriving late affects you and other patients whose appointments are after yours.  Also, if you no show three or more times for appointments you may be dismissed from the clinic.  Again, thank you for choosing [facility]. Our hope is that these requests will allow you access to exceptional care and in a timely manner. _______________________________________________________________  If you have questions after your visit, please contact our office at [phone] between the hours of 8:30 a.m. and 5:00 p.m. Voicemails left after 4:30 p.m. will not be returned until the following business day. _______________________________________________________________  For prescription refill requests, have your pharmacy contact our office. _______________________________________________________________  Recommendations made by the consultant and any test results will be sent to your referring physician. _______________________________________________________________ 

## 2019-03-17 NOTE — Progress Notes (Signed)
Patients chemotherapy pump disconnected with no complaints of pain with flush.  Port site clean and dry with good blood return noted.  No bruising or swelling noted.  Band aid applied.  VSS with discharge and left ambulatory with no s/s of distress noted.

## 2019-03-27 ENCOUNTER — Other Ambulatory Visit: Payer: Self-pay

## 2019-03-27 ENCOUNTER — Inpatient Hospital Stay (HOSPITAL_COMMUNITY): Payer: 59

## 2019-03-27 ENCOUNTER — Inpatient Hospital Stay (HOSPITAL_COMMUNITY): Payer: 59 | Attending: Hematology

## 2019-03-27 ENCOUNTER — Encounter (HOSPITAL_COMMUNITY): Payer: Self-pay | Admitting: Hematology

## 2019-03-27 ENCOUNTER — Encounter (HOSPITAL_COMMUNITY): Payer: Self-pay

## 2019-03-27 ENCOUNTER — Inpatient Hospital Stay (HOSPITAL_BASED_OUTPATIENT_CLINIC_OR_DEPARTMENT_OTHER): Payer: 59 | Admitting: Hematology

## 2019-03-27 VITALS — BP 134/67 | HR 67 | Temp 98.1°F | Resp 18 | Wt 125.8 lb

## 2019-03-27 DIAGNOSIS — C184 Malignant neoplasm of transverse colon: Secondary | ICD-10-CM | POA: Diagnosis present

## 2019-03-27 DIAGNOSIS — R935 Abnormal findings on diagnostic imaging of other abdominal regions, including retroperitoneum: Secondary | ICD-10-CM | POA: Diagnosis not present

## 2019-03-27 DIAGNOSIS — G479 Sleep disorder, unspecified: Secondary | ICD-10-CM | POA: Diagnosis not present

## 2019-03-27 DIAGNOSIS — Z86010 Personal history of colonic polyps: Secondary | ICD-10-CM | POA: Insufficient documentation

## 2019-03-27 DIAGNOSIS — R002 Palpitations: Secondary | ICD-10-CM | POA: Diagnosis not present

## 2019-03-27 DIAGNOSIS — T451X5A Adverse effect of antineoplastic and immunosuppressive drugs, initial encounter: Secondary | ICD-10-CM | POA: Insufficient documentation

## 2019-03-27 DIAGNOSIS — K644 Residual hemorrhoidal skin tags: Secondary | ICD-10-CM | POA: Insufficient documentation

## 2019-03-27 DIAGNOSIS — Z452 Encounter for adjustment and management of vascular access device: Secondary | ICD-10-CM | POA: Insufficient documentation

## 2019-03-27 DIAGNOSIS — Z5111 Encounter for antineoplastic chemotherapy: Secondary | ICD-10-CM | POA: Diagnosis present

## 2019-03-27 DIAGNOSIS — Z801 Family history of malignant neoplasm of trachea, bronchus and lung: Secondary | ICD-10-CM | POA: Diagnosis not present

## 2019-03-27 DIAGNOSIS — D7589 Other specified diseases of blood and blood-forming organs: Secondary | ICD-10-CM | POA: Insufficient documentation

## 2019-03-27 DIAGNOSIS — F17210 Nicotine dependence, cigarettes, uncomplicated: Secondary | ICD-10-CM | POA: Diagnosis not present

## 2019-03-27 DIAGNOSIS — Z809 Family history of malignant neoplasm, unspecified: Secondary | ICD-10-CM | POA: Insufficient documentation

## 2019-03-27 DIAGNOSIS — Y92531 Health care provider office as the place of occurrence of the external cause: Secondary | ICD-10-CM | POA: Diagnosis not present

## 2019-03-27 DIAGNOSIS — D6959 Other secondary thrombocytopenia: Secondary | ICD-10-CM | POA: Diagnosis not present

## 2019-03-27 DIAGNOSIS — Z9049 Acquired absence of other specified parts of digestive tract: Secondary | ICD-10-CM | POA: Insufficient documentation

## 2019-03-27 DIAGNOSIS — F064 Anxiety disorder due to known physiological condition: Secondary | ICD-10-CM | POA: Diagnosis not present

## 2019-03-27 DIAGNOSIS — D701 Agranulocytosis secondary to cancer chemotherapy: Secondary | ICD-10-CM | POA: Diagnosis not present

## 2019-03-27 DIAGNOSIS — Z8249 Family history of ischemic heart disease and other diseases of the circulatory system: Secondary | ICD-10-CM | POA: Insufficient documentation

## 2019-03-27 LAB — COMPREHENSIVE METABOLIC PANEL
ALT: 18 U/L (ref 0–44)
AST: 24 U/L (ref 15–41)
Albumin: 4.4 g/dL (ref 3.5–5.0)
Alkaline Phosphatase: 78 U/L (ref 38–126)
Anion gap: 9 (ref 5–15)
BUN: 9 mg/dL (ref 8–23)
CO2: 24 mmol/L (ref 22–32)
Calcium: 9.6 mg/dL (ref 8.9–10.3)
Chloride: 106 mmol/L (ref 98–111)
Creatinine, Ser: 0.66 mg/dL (ref 0.44–1.00)
GFR calc Af Amer: >60 mL/min (ref >60)
GFR calc non Af Amer: >60 mL/min (ref >60)
Glucose, Bld: 105 mg/dL — ABNORMAL HIGH (ref 70–99)
Potassium: 3.7 mmol/L (ref 3.5–5.1)
Sodium: 139 mmol/L (ref 135–145)
Total Bilirubin: 0.6 mg/dL (ref 0.3–1.2)
Total Protein: 7.4 g/dL (ref 6.5–8.1)

## 2019-03-27 LAB — CBC WITH DIFFERENTIAL/PLATELET
Abs Immature Granulocytes: 0.03 10*3/uL (ref 0.00–0.07)
Basophils Absolute: 0.1 10*3/uL (ref 0.0–0.1)
Basophils Relative: 1 %
Eosinophils Absolute: 0.1 10*3/uL (ref 0.0–0.5)
Eosinophils Relative: 1 %
HCT: 36 % (ref 36.0–46.0)
Hemoglobin: 12.3 g/dL (ref 12.0–15.0)
Immature Granulocytes: 1 %
Lymphocytes Relative: 49 %
Lymphs Abs: 2.4 10*3/uL (ref 0.7–4.0)
MCH: 34.2 pg — ABNORMAL HIGH (ref 26.0–34.0)
MCHC: 34.2 g/dL (ref 30.0–36.0)
MCV: 100 fL (ref 80.0–100.0)
Monocytes Absolute: 0.5 10*3/uL (ref 0.1–1.0)
Monocytes Relative: 10 %
Neutro Abs: 1.9 10*3/uL (ref 1.7–7.7)
Neutrophils Relative %: 38 %
Platelets: 224 10*3/uL (ref 150–400)
RBC: 3.6 MIL/uL — ABNORMAL LOW (ref 3.87–5.11)
RDW: 13.5 % (ref 11.5–15.5)
WBC: 4.9 10*3/uL (ref 4.0–10.5)
nRBC: 0 % (ref 0.0–0.2)

## 2019-03-27 MED ORDER — PALONOSETRON HCL INJECTION 0.25 MG/5ML
0.2500 mg | Freq: Once | INTRAVENOUS | Status: AC
  Administered 2019-03-27: 0.25 mg via INTRAVENOUS
  Filled 2019-03-27: qty 5

## 2019-03-27 MED ORDER — SODIUM CHLORIDE 0.9 % IV SOLN
10.0000 mg | Freq: Once | INTRAVENOUS | Status: AC
  Administered 2019-03-27: 10 mg via INTRAVENOUS
  Filled 2019-03-27: qty 10

## 2019-03-27 MED ORDER — SODIUM CHLORIDE 0.9 % IV SOLN
2400.0000 mg/m2 | INTRAVENOUS | Status: DC
  Administered 2019-03-27: 3850 mg via INTRAVENOUS
  Filled 2019-03-27: qty 77

## 2019-03-27 MED ORDER — OXALIPLATIN CHEMO INJECTION 100 MG/20ML
85.0000 mg/m2 | Freq: Once | INTRAVENOUS | Status: AC
  Administered 2019-03-27: 135 mg via INTRAVENOUS
  Filled 2019-03-27: qty 20

## 2019-03-27 MED ORDER — DEXTROSE 5 % IV SOLN
Freq: Once | INTRAVENOUS | Status: AC
  Administered 2019-03-27: 09:00:00 via INTRAVENOUS

## 2019-03-27 MED ORDER — LEUCOVORIN CALCIUM 500 MG/50ML IJ SOLN
Freq: Once | INTRAVENOUS | Status: AC
  Administered 2019-03-27: 640 mg via INTRAVENOUS
  Filled 2019-03-27: qty 250

## 2019-03-27 MED ORDER — FLUOROURACIL CHEMO INJECTION 2.5 GM/50ML
400.0000 mg/m2 | Freq: Once | INTRAVENOUS | Status: AC
  Administered 2019-03-27: 650 mg via INTRAVENOUS
  Filled 2019-03-27: qty 13

## 2019-03-27 MED ORDER — SODIUM CHLORIDE 0.9% FLUSH
10.0000 mL | INTRAVENOUS | Status: DC | PRN
  Administered 2019-03-27: 10 mL
  Filled 2019-03-27: qty 10

## 2019-03-27 NOTE — Progress Notes (Signed)
Andrea Simon, [place] [id number]   CLINIC:  Medical Oncology/Hematology  PCP:  Andrea Helper, MD [address], [place] [address] [phone]   REASON FOR VISIT:  Follow-up for colon cancer.   BRIEF ONCOLOGIC HISTORY:    Colon cancer ([place])   01/09/2019 Initial Diagnosis    Colon cancer ([place])    03/01/2019 -  Chemotherapy    The patient had palonosetron (ALOXI) injection 0.25 mg, 0.25 mg, Intravenous,  Once, 3 of 12 cycles Administration: 0.25 mg (03/01/2019), 0.25 mg (03/15/2019), 0.25 mg (03/27/2019) leucovorin 600 mg in dextrose 5 % 250 mL infusion, 640 mg, Intravenous,  Once, 3 of 12 cycles Administration: 600 mg (03/01/2019), 600 mg (03/15/2019), 640 mg (03/27/2019) oxaliplatin (ELOXATIN) 135 mg in dextrose 5 % 500 mL chemo infusion, 85 mg/m2 = 135 mg, Intravenous,  Once, 3 of 12 cycles Administration: 135 mg (03/01/2019), 135 mg (03/15/2019), 135 mg (03/27/2019) fluorouracil (ADRUCIL) chemo injection 650 mg, 400 mg/m2 = 650 mg, Intravenous,  Once, 3 of 12 cycles Administration: 650 mg (03/01/2019), 650 mg (03/15/2019), 650 mg (03/27/2019) fluorouracil (ADRUCIL) 3,850 mg in sodium chloride 0.9 % 73 mL chemo infusion, 2,400 mg/m2 = 3,850 mg, Intravenous, 1 Day/Dose, 3 of 12 cycles Administration: 3,850 mg (03/01/2019), 3,850 mg (03/15/2019), 3,850 mg (03/27/2019)  for chemotherapy treatment.       CANCER STAGING: Cancer Staging No matching staging information was found for the patient.   INTERVAL HISTORY:  Andrea Simon 63 y.o. female returns for routine follow-up and consideration for next cycle of chemotherapy. She is here today alone. She states that her skin is peeling on her right foot. She states that she has no appetite, she drinks a protein shake everyday. Denies any nausea, vomiting, or diarrhea. Denies any new pains. Had not noticed any recent bleeding such as epistaxis, hematuria or hematochezia. Denies recent chest pain on  exertion, shortness of breath on minimal exertion, pre-syncopal episodes, or palpitations. Denies any numbness or tingling in hands or feet. Denies any recent fevers, infections, or recent hospitalizations. Patient reports appetite at 50% and energy level at 75%.    REVIEW OF SYSTEMS:  Review of Systems  All other systems reviewed and are negative.    PAST MEDICAL/SURGICAL HISTORY:  Past Medical History:  Diagnosis Date  . Hyperlipidemia   . Multinodular goiter   . Prediabetes    Past Surgical History:  Procedure Laterality Date  . BIOPSY  12/07/2018   Procedure: BIOPSY;  Surgeon: Rogene Houston, MD;  [facility];  Service: Endoscopy;;  sigmoid colon  . COLON RESECTION N/A 01/09/2019   Procedure: LAPAROSCOPIC RIGHT HEMICOLECTOMY;  Surgeon: Virl Cagey, MD;  Location: [facility];  Service: General;  Laterality: N/A;  . COLONOSCOPY N/A 12/07/2018   Procedure: COLONOSCOPY;  Surgeon: Rogene Houston, MD;  Location: [facility];  Service: Endoscopy;  Laterality: N/A;  12:45  . DILATION AND CURETTAGE OF UTERUS  2006  . FNA thyroid  2011 and 2019   benign  . POLYPECTOMY  12/07/2018   Procedure: POLYPECTOMY;  Surgeon: Rogene Houston, MD;  Location: [facility];  Service: Endoscopy;;  splenic flexure (CS x1)  . PORTACATH PLACEMENT Left 02/15/2019   Procedure: INSERTION PORT-A-CATH;  Surgeon: Virl Cagey, MD;  Location: [facility];  Service: General;  Laterality: Left;  . TUBAL LIGATION  1992     SOCIAL HISTORY:  Social History   Socioeconomic History  .  Marital status: Married    Spouse name: Not on file  . Number of children: 1  . Years of education: Not on file  . Highest education level: Not on file  Occupational History  . Occupation: [employer]  . Financial resource strain: Not on file  . Food insecurity:    Worry: Not on file    Inability: Not on file  . Transportation needs:    Medical: Not on file     Non-medical: Not on file  Tobacco Use  . Smoking status: Current Every Day Smoker    Packs/day: 0.75    Years: 45.00    Pack years: 33.75    Types: Cigarettes  . Smokeless tobacco: Never Used  Substance and Sexual Activity  . Alcohol use: Yes    Alcohol/week: 0.0 standard drinks    Comment: occasionally  . Drug use: No  . Sexual activity: Yes  Lifestyle  . Physical activity:    Days per week: Not on file    Minutes per session: Not on file  . Stress: Not on file  Relationships  . Social connections:    Talks on phone: Not on file    Gets together: Not on file    Attends religious service: Not on file    Active member of club or organization: Not on file    Attends meetings of clubs or organizations: Not on file    Relationship status: Not on file  . Intimate partner violence:    Fear of current or ex partner: Not on file    Emotionally abused: Not on file    Physically abused: Not on file    Forced sexual activity: Not on file  Other Topics Concern  . Not on file  Social History Narrative  . Not on file    FAMILY HISTORY:  Family History  Problem Relation Age of Onset  . Hypertension Mother        AAA  . Coronary artery disease Mother   . Hyperlipidemia Mother   . Cancer Mother        lung  . Hypertension Father   . Cancer Brother     CURRENT MEDICATIONS:  Outpatient Encounter Medications as of 03/27/2019  Medication Sig Note  . acetaminophen (TYLENOL) 500 MG tablet Take 1,000 mg by mouth every 6 (six) hours as needed for moderate pain.    Marland Kitchen ALPRAZolam (XANAX) 0.25 MG tablet Take one tablet at bedtime, as needed , for anxiety (Patient taking differently: Take 0.25 mg by mouth at bedtime as needed for anxiety. Take one tablet at bedtime, as needed , for anxiety)   . aspirin EC 81 MG tablet Take 1 tablet (81 mg total) by mouth every evening.   . Calcium Carbonate-Vitamin D (CALCIUM 600+D) 600-400 MG-UNIT tablet Take 1 tablet by mouth 2 (two) times a week. Sunday  and Wednesday evenings   . docusate sodium (COLACE) 100 MG capsule Take 1 capsule (100 mg total) by mouth 2 (two) times daily.   . fluorouracil CALGB 93235 in sodium chloride 0.9 % 150 mL Inject into the vein 2 days. Every 14 days 02/02/2019: Has not started yet  . LEUCOVORIN CALCIUM IV Inject into the vein every 14 (fourteen) days. 02/02/2019: Has not started yet  . lidocaine-prilocaine (EMLA) cream Apply to port a cath site one hour prior to chemotherapy appointment to numb the skin over your port site. 02/02/2019: Has not started yet  . ondansetron (ZOFRAN-ODT)  4 MG disintegrating tablet Take 1 tablet (4 mg total) by mouth every 6 (six) hours as needed for nausea.   . OXALIPLATIN IV Inject into the vein every 14 (fourteen) days. 02/02/2019: Has not started yet  . pantoprazole (PROTONIX) 40 MG tablet Take 1 tablet (40 mg total) by mouth daily.   . pravastatin (PRAVACHOL) 40 MG tablet TAKE 1 TABLET BY MOUTH  DAILY IN THE EVENING (Patient taking differently: Take 40 mg by mouth 4 (four) times a week. )   . prochlorperazine (COMPAZINE) 10 MG tablet Take 1 tablet (10 mg total) by mouth every 6 (six) hours as needed (Nausea or vomiting). 02/02/2019: Has not picked up yet   No facility-administered encounter medications on file as of 03/27/2019.     ALLERGIES:  No Known Allergies   PHYSICAL EXAM:  ECOG Performance status: 1  I reviewed her vitals.  Blood pressure is 147/87, pulse rate is 70, respiratory rate is 18, temperature 98.2.  Physical Exam Vitals signs reviewed.  Constitutional:      Appearance: Normal appearance.  Cardiovascular:     Rate and Rhythm: Normal rate and regular rhythm.     Heart sounds: Normal heart sounds.  Pulmonary:     Effort: Pulmonary effort is normal.     Breath sounds: Normal breath sounds.  Abdominal:     General: Bowel sounds are normal. There is no distension.     Palpations: Abdomen is soft.  Skin:    General: Skin is warm.  Neurological:     General: No  focal deficit present.     Mental Status: She is alert and oriented to person, place, and time.  Psychiatric:        Mood and Affect: Mood normal.        Behavior: Behavior normal.      LABORATORY DATA:  I have reviewed the labs as listed.  CBC    Component Value Date/Time   WBC 4.9 03/27/2019 0824   RBC 3.60 (L) 03/27/2019 0824   HGB 12.3 03/27/2019 0824   HGB 13.5 08/18/2018 0758   HCT 36.0 03/27/2019 0824   HCT 37.2 08/18/2018 0758   PLT 224 03/27/2019 0824   PLT 259 08/18/2018 0758   MCV 100.0 03/27/2019 0824   MCV 104 (H) 08/18/2018 0758   MCH 34.2 (H) 03/27/2019 0824   MCHC 34.2 03/27/2019 0824   RDW 13.5 03/27/2019 0824   RDW 13.0 08/18/2018 0758   LYMPHSABS 2.4 03/27/2019 0824   LYMPHSABS 3.5 (H) 08/31/2017 0955   MONOABS 0.5 03/27/2019 0824   EOSABS 0.1 03/27/2019 0824   EOSABS 0.1 08/31/2017 0955   BASOSABS 0.1 03/27/2019 0824   BASOSABS 0.0 08/31/2017 0955   CMP Latest Ref Rng & Units 03/27/2019 03/15/2019 03/01/2019  Glucose 70 - 99 mg/dL 105(H) 128(H) 94  BUN 8 - 23 mg/dL '9 10 15  ' Creatinine 0.44 - 1.00 mg/dL 0.66 0.68 0.68  Sodium 135 - 145 mmol/L 139 139 137  Potassium 3.5 - 5.1 mmol/L 3.7 3.3(L) 4.4  Chloride 98 - 111 mmol/L 106 108 104  CO2 22 - 32 mmol/L '24 25 26  ' Calcium 8.9 - 10.3 mg/dL 9.6 9.6 10.1  Total Protein 6.5 - 8.1 g/dL 7.4 7.1 7.9  Total Bilirubin 0.3 - 1.2 mg/dL 0.6 0.8 0.9  Alkaline Phos 38 - 126 U/L 78 65 64  AST 15 - 41 U/L '24 25 23  ' ALT 0 - 44 U/L '18 20 20       ' DIAGNOSTIC  IMAGING:  I have independently reviewed the scans and discussed with the patient.   I have reviewed Venita Lick LPN's note and agree with the documentation.  I personally performed a face-to-face visit, made revisions and my assessment and plan is as follows.    ASSESSMENT & PLAN:   Malignant neoplasm of transverse colon ([place]) 1.  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  Biopsy was consistent with invasive poorly differentiated adenocarcinoma.  Small polyp was found in the proximal sigmoid colon which was sessile.  External hemorrhoids were present. -CT of the abdomen and pelvis on 12/16/2018 showing a 4.2 x 2.9 cm near circumferential apple core type mass involving the proximal transverse colon with a few small lymph nodes noted in the pericolonic fat.  No retroperitoneal adenopathy or evidence of liver mets. -Laparoscopic right colectomy on 01/09/2019, pathology showing 4 cm moderately differentiated invasive adenocarcinoma, grade 2, 1/25 lymph nodes positive, no tumor deposits, no perineural invasion.  Tumor extends through serosa into adherent omentum.  Margins are negative.  - MMR protein IHC shows loss of nuclear expression of MLH1.  MSI was high by PCR.  BRAF testing was negative. - PET CT scan on 01/31/2019 shows focal hypermetabolic activity identified at the ileocolic anastomosis with SUV max of 9.  This could be related to postsurgical changes, but follow-up as needed.  Low-level FDG uptake in the midline scar likely related to healing. -CEA level on 01/18/2019 was mildly elevated at 5.1. - 2 cycles of adjuvant FOLFOX from 03/01/2019 through 03/15/2019. -She had experienced some nausea but denied any vomiting.  Experience some cold sensitivity lasting 1 to 2 days. -Has lost taste sensation. - MSI-high profile by PCR.  BRAF mutation was negative.  MLH1 hyper methylation was present.  Even though there have been a few reported cases of germline hyper methylation of the CPG islands on the MLH1 promoter region, the presence of such hyper methylation has been documented in approximately 20% of sporadic colorectal cancers, which correlated with loss of MLH1 expression and high MSI.  Thus, tumors exhibiting IHC loss of MLH1 and the presence of MLH1 promoter methylation are presumed more likely to be a  sporadic cancer. -We reviewed her blood work today.  She may proceed with cycle 3 without any dose modifications. -We will see her back in 2 weeks for follow-up.  2.  Macrocytosis: -MCV is 100.  Hemoglobin is 12.1. -Work-up including Q67, folic acid and TSH was normal.       Orders placed this encounter:  No orders of the defined types were placed in this encounter.     Derek Jack, MD [place] [phone]

## 2019-03-27 NOTE — Patient Instructions (Signed)
[facility] Discharge Instructions for Patients Receiving Chemotherapy  Today you received the following chemotherapy agents   To help prevent nausea and vomiting after your treatment, we encourage you to take your nausea medication   If you develop nausea and vomiting that is not controlled by your nausea medication, call the clinic.   BELOW ARE SYMPTOMS THAT SHOULD BE REPORTED IMMEDIATELY:  *FEVER GREATER THAN 100.5 F  *CHILLS WITH OR WITHOUT FEVER  NAUSEA AND VOMITING THAT IS NOT CONTROLLED WITH YOUR NAUSEA MEDICATION  *UNUSUAL SHORTNESS OF BREATH  *UNUSUAL BRUISING OR BLEEDING  TENDERNESS IN MOUTH AND THROAT WITH OR WITHOUT PRESENCE OF ULCERS  *URINARY PROBLEMS  *BOWEL PROBLEMS  UNUSUAL RASH Items with * indicate a potential emergency and should be followed up as soon as possible.  Feel free to call the clinic should you have any questions or concerns. The clinic phone number is [phone].  Please show the CHEMO ALERT CARD at check-in to the Emergency Department and triage nurse.   

## 2019-03-27 NOTE — Assessment & Plan Note (Signed)
1.  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  Biopsy was consistent with invasive poorly differentiated adenocarcinoma.  Small polyp was found in the proximal sigmoid colon which was sessile.  External hemorrhoids were present. -CT of the abdomen and pelvis on 12/16/2018 showing a 4.2 x 2.9 cm near circumferential apple core type mass involving the proximal transverse colon with a few small lymph nodes noted in the pericolonic fat.  No retroperitoneal adenopathy or evidence of liver mets. -Laparoscopic right colectomy on 01/09/2019, pathology showing 4 cm moderately differentiated invasive adenocarcinoma, grade 2, 1/25 lymph nodes positive, no tumor deposits, no perineural invasion.  Tumor extends through serosa into adherent omentum.  Margins are negative.  - MMR protein IHC shows loss of nuclear expression of MLH1.  MSI was high by PCR.  BRAF testing was negative. - PET CT scan on 01/31/2019 shows focal hypermetabolic activity identified at the ileocolic anastomosis with SUV max of 9.  This could be related to postsurgical changes, but follow-up as needed.  Low-level FDG uptake in the midline scar likely related to healing. -CEA level on 01/18/2019 was mildly elevated at 5.1. - 2 cycles of adjuvant FOLFOX from 03/01/2019 through 03/15/2019. -She had experienced some nausea but denied any vomiting.  Experience some cold sensitivity lasting 1 to 2 days. -Has lost taste sensation. - MSI-high profile by PCR.  BRAF mutation was negative.  MLH1 hyper methylation was present.  Even though there have been a few reported cases of germline hyper methylation of the CPG islands on the MLH1 promoter region, the presence of such hyper methylation has been documented in approximately 20% of sporadic colorectal cancers, which correlated with loss of MLH1 expression and high MSI.  Thus,  tumors exhibiting IHC loss of MLH1 and the presence of MLH1 promoter methylation are presumed more likely to be a sporadic cancer. -We reviewed her blood work today.  She may proceed with cycle 3 without any dose modifications. -We will see her back in 2 weeks for follow-up.  2.  Macrocytosis: -MCV is 100.  Hemoglobin is 12.1. -Work-up including S13, folic acid and TSH was normal.

## 2019-03-27 NOTE — Patient Instructions (Signed)
[place] at [facility] Discharge Instructions  You were seen today by Dr. Delton Coombes. He went over your recent labs results. He will see you back in 2 weeks for labs, treatment and follow up.   Thank you for choosing [place] at [facility] to provide your oncology and hematology care.  To afford each patient quality time with our provider, please arrive at least 15 minutes before your scheduled appointment time.   If you have a lab appointment with the [facility] please come in thru the  Main Entrance and check in at the main information desk  You need to re-schedule your appointment should you arrive 10 or more minutes late.  We strive to give you quality time with our providers, and arriving late affects you and other patients whose appointments are after yours.  Also, if you no show three or more times for appointments you may be dismissed from the clinic at the providers discretion.     Again, thank you for choosing [facility].  Our hope is that these requests will decrease the amount of time that you wait before being seen by our physicians.       _____________________________________________________________  Should you have questions after your visit to [facility], please contact our office at [phone] between the hours of 8:00 a.m. and 4:30 p.m.  Voicemails left after 4:00 p.m. will not be returned until the following business day.  For prescription refill requests, have your pharmacy contact our office and allow 72 hours.    Cancer Center Support Programs:   > Cancer Support Group  2nd Tuesday of the month 1pm-2pm, Journey Room

## 2019-03-27 NOTE — Progress Notes (Signed)
Labs reviewed with MD at office visit today. Proceed today with treatment.  Continuous 5FU pump administered today.   Treatment given per orders. Patient tolerated it well without problems. Vitals stable and discharged home from clinic ambulatory. Follow up as scheduled.

## 2019-03-28 ENCOUNTER — Other Ambulatory Visit: Payer: Self-pay

## 2019-03-29 ENCOUNTER — Inpatient Hospital Stay (HOSPITAL_COMMUNITY): Payer: 59

## 2019-03-29 ENCOUNTER — Encounter (HOSPITAL_COMMUNITY): Payer: Self-pay

## 2019-03-29 VITALS — BP 119/70 | HR 73 | Temp 98.3°F | Resp 18

## 2019-03-29 DIAGNOSIS — C184 Malignant neoplasm of transverse colon: Secondary | ICD-10-CM

## 2019-03-29 DIAGNOSIS — Z452 Encounter for adjustment and management of vascular access device: Secondary | ICD-10-CM | POA: Diagnosis not present

## 2019-03-29 MED ORDER — HEPARIN SOD (PORK) LOCK FLUSH 100 UNIT/ML IV SOLN
500.0000 [IU] | Freq: Once | INTRAVENOUS | Status: AC | PRN
  Administered 2019-03-29: 500 [IU]

## 2019-03-29 MED ORDER — SODIUM CHLORIDE 0.9% FLUSH
10.0000 mL | INTRAVENOUS | Status: DC | PRN
  Administered 2019-03-29: 10 mL
  Filled 2019-03-29: qty 10

## 2019-03-29 NOTE — Progress Notes (Signed)
Patients chemotherapy pump disconnected with no complaints voiced.  Site clean and dry with good blood return noted before and after treatment.  Band aid applied.  VSs with discharge and left ambulatory with no s/s of distress noted.

## 2019-04-07 ENCOUNTER — Other Ambulatory Visit: Payer: Self-pay

## 2019-04-10 ENCOUNTER — Inpatient Hospital Stay (HOSPITAL_COMMUNITY): Payer: 59

## 2019-04-10 ENCOUNTER — Other Ambulatory Visit: Payer: Self-pay

## 2019-04-10 ENCOUNTER — Inpatient Hospital Stay (HOSPITAL_BASED_OUTPATIENT_CLINIC_OR_DEPARTMENT_OTHER): Payer: 59 | Admitting: Hematology

## 2019-04-10 ENCOUNTER — Encounter (HOSPITAL_COMMUNITY): Payer: Self-pay

## 2019-04-10 ENCOUNTER — Encounter (HOSPITAL_COMMUNITY): Payer: Self-pay | Admitting: Hematology

## 2019-04-10 VITALS — BP 115/63 | HR 63 | Temp 98.4°F | Resp 18

## 2019-04-10 DIAGNOSIS — Z801 Family history of malignant neoplasm of trachea, bronchus and lung: Secondary | ICD-10-CM

## 2019-04-10 DIAGNOSIS — K644 Residual hemorrhoidal skin tags: Secondary | ICD-10-CM

## 2019-04-10 DIAGNOSIS — C184 Malignant neoplasm of transverse colon: Secondary | ICD-10-CM

## 2019-04-10 DIAGNOSIS — G479 Sleep disorder, unspecified: Secondary | ICD-10-CM

## 2019-04-10 DIAGNOSIS — D6959 Other secondary thrombocytopenia: Secondary | ICD-10-CM

## 2019-04-10 DIAGNOSIS — Z8249 Family history of ischemic heart disease and other diseases of the circulatory system: Secondary | ICD-10-CM

## 2019-04-10 DIAGNOSIS — D701 Agranulocytosis secondary to cancer chemotherapy: Secondary | ICD-10-CM

## 2019-04-10 DIAGNOSIS — Z86010 Personal history of colonic polyps: Secondary | ICD-10-CM

## 2019-04-10 DIAGNOSIS — Z452 Encounter for adjustment and management of vascular access device: Secondary | ICD-10-CM | POA: Diagnosis not present

## 2019-04-10 DIAGNOSIS — R002 Palpitations: Secondary | ICD-10-CM | POA: Diagnosis not present

## 2019-04-10 DIAGNOSIS — F17210 Nicotine dependence, cigarettes, uncomplicated: Secondary | ICD-10-CM

## 2019-04-10 DIAGNOSIS — R935 Abnormal findings on diagnostic imaging of other abdominal regions, including retroperitoneum: Secondary | ICD-10-CM

## 2019-04-10 DIAGNOSIS — Z9049 Acquired absence of other specified parts of digestive tract: Secondary | ICD-10-CM

## 2019-04-10 DIAGNOSIS — F064 Anxiety disorder due to known physiological condition: Secondary | ICD-10-CM

## 2019-04-10 DIAGNOSIS — Z809 Family history of malignant neoplasm, unspecified: Secondary | ICD-10-CM

## 2019-04-10 DIAGNOSIS — D7589 Other specified diseases of blood and blood-forming organs: Secondary | ICD-10-CM

## 2019-04-10 LAB — CBC WITH DIFFERENTIAL/PLATELET
Abs Immature Granulocytes: 0.01 10*3/uL (ref 0.00–0.07)
Basophils Absolute: 0 10*3/uL (ref 0.0–0.1)
Basophils Relative: 1 %
Eosinophils Absolute: 0 10*3/uL (ref 0.0–0.5)
Eosinophils Relative: 1 %
HCT: 35.4 % — ABNORMAL LOW (ref 36.0–46.0)
Hemoglobin: 11.8 g/dL — ABNORMAL LOW (ref 12.0–15.0)
Immature Granulocytes: 0 %
Lymphocytes Relative: 43 %
Lymphs Abs: 1.6 10*3/uL (ref 0.7–4.0)
MCH: 32.5 pg (ref 26.0–34.0)
MCHC: 33.3 g/dL (ref 30.0–36.0)
MCV: 97.5 fL (ref 80.0–100.0)
Monocytes Absolute: 0.4 10*3/uL (ref 0.1–1.0)
Monocytes Relative: 10 %
Neutro Abs: 1.7 10*3/uL (ref 1.7–7.7)
Neutrophils Relative %: 45 %
Platelets: 104 10*3/uL — ABNORMAL LOW (ref 150–400)
RBC: 3.63 MIL/uL — ABNORMAL LOW (ref 3.87–5.11)
RDW: 14 % (ref 11.5–15.5)
WBC: 3.7 10*3/uL — ABNORMAL LOW (ref 4.0–10.5)
nRBC: 0 % (ref 0.0–0.2)

## 2019-04-10 LAB — COMPREHENSIVE METABOLIC PANEL
ALT: 17 U/L (ref 0–44)
AST: 25 U/L (ref 15–41)
Albumin: 4.3 g/dL (ref 3.5–5.0)
Alkaline Phosphatase: 75 U/L (ref 38–126)
Anion gap: 9 (ref 5–15)
BUN: 15 mg/dL (ref 8–23)
CO2: 24 mmol/L (ref 22–32)
Calcium: 9.3 mg/dL (ref 8.9–10.3)
Chloride: 105 mmol/L (ref 98–111)
Creatinine, Ser: 0.72 mg/dL (ref 0.44–1.00)
GFR calc Af Amer: >60 mL/min (ref >60)
GFR calc non Af Amer: >60 mL/min (ref >60)
Glucose, Bld: 113 mg/dL — ABNORMAL HIGH (ref 70–99)
Potassium: 3.5 mmol/L (ref 3.5–5.1)
Sodium: 138 mmol/L (ref 135–145)
Total Bilirubin: 0.8 mg/dL (ref 0.3–1.2)
Total Protein: 7.2 g/dL (ref 6.5–8.1)

## 2019-04-10 MED ORDER — DEXTROSE 5 % IV SOLN
Freq: Once | INTRAVENOUS | Status: AC
  Administered 2019-04-10: 11:00:00 via INTRAVENOUS

## 2019-04-10 MED ORDER — FLUOROURACIL CHEMO INJECTION 2.5 GM/50ML
400.0000 mg/m2 | Freq: Once | INTRAVENOUS | Status: AC
  Administered 2019-04-10: 650 mg via INTRAVENOUS
  Filled 2019-04-10: qty 13

## 2019-04-10 MED ORDER — PALONOSETRON HCL INJECTION 0.25 MG/5ML
0.2500 mg | Freq: Once | INTRAVENOUS | Status: AC
  Administered 2019-04-10: 0.25 mg via INTRAVENOUS
  Filled 2019-04-10: qty 5

## 2019-04-10 MED ORDER — LEUCOVORIN CALCIUM INJECTION 350 MG
400.0000 mg/m2 | Freq: Once | INTRAVENOUS | Status: AC
  Administered 2019-04-10: 640 mg via INTRAVENOUS
  Filled 2019-04-10: qty 32

## 2019-04-10 MED ORDER — DEXTROSE 5 % IV SOLN
Freq: Once | INTRAVENOUS | Status: AC
  Administered 2019-04-10: 10:00:00 via INTRAVENOUS

## 2019-04-10 MED ORDER — OXALIPLATIN CHEMO INJECTION 100 MG/20ML
85.0000 mg/m2 | Freq: Once | INTRAVENOUS | Status: AC
  Administered 2019-04-10: 135 mg via INTRAVENOUS
  Filled 2019-04-10: qty 7

## 2019-04-10 MED ORDER — SODIUM CHLORIDE 0.9 % IV SOLN
10.0000 mg | Freq: Once | INTRAVENOUS | Status: AC
  Administered 2019-04-10: 10 mg via INTRAVENOUS
  Filled 2019-04-10: qty 10

## 2019-04-10 MED ORDER — SODIUM CHLORIDE 0.9% FLUSH
10.0000 mL | INTRAVENOUS | Status: DC | PRN
  Administered 2019-04-10: 10 mL
  Filled 2019-04-10: qty 10

## 2019-04-10 MED ORDER — SODIUM CHLORIDE 0.9 % IV SOLN
2400.0000 mg/m2 | INTRAVENOUS | Status: DC
  Administered 2019-04-10: 3850 mg via INTRAVENOUS
  Filled 2019-04-10: qty 77

## 2019-04-10 NOTE — Patient Instructions (Signed)
[facility] Discharge Instructions for Patients Receiving Chemotherapy   Beginning January 23rd 2017 lab work for the Cancer Center will be done in the  Main lab at [facility] on 1st floor. If you have a lab appointment with the Cancer Center please come in thru the  Main Entrance and check in at the main information desk   Today you received the following chemotherapy agents Oxaliplatin,Leucovorin and 5FU. Follow-up as scheduled. Call clinic for any questions or concerns  To help prevent nausea and vomiting after your treatment, we encourage you to take your nausea medication   If you develop nausea and vomiting, or diarrhea that is not controlled by your medication, call the clinic.  The clinic phone number is [phone]. Office hours are Monday-Friday 8:30am-5:00pm.  BELOW ARE SYMPTOMS THAT SHOULD BE REPORTED IMMEDIATELY:  *FEVER GREATER THAN 101.0 F  *CHILLS WITH OR WITHOUT FEVER  NAUSEA AND VOMITING THAT IS NOT CONTROLLED WITH YOUR NAUSEA MEDICATION  *UNUSUAL SHORTNESS OF BREATH  *UNUSUAL BRUISING OR BLEEDING  TENDERNESS IN MOUTH AND THROAT WITH OR WITHOUT PRESENCE OF ULCERS  *URINARY PROBLEMS  *BOWEL PROBLEMS  UNUSUAL RASH Items with * indicate a potential emergency and should be followed up as soon as possible. If you have an emergency after office hours please contact your primary care physician or go to the nearest emergency department.  Please call the clinic during office hours if you have any questions or concerns.   You may also contact the Patient Navigator at [phone] should you have any questions or need assistance in obtaining follow up care.      Resources For Cancer Patients and their Caregivers ? American Cancer Society: Can assist with transportation, wigs, general needs, runs Look Good Feel Better.        1-888-227-6333 ? Cancer Care: Provides financial assistance, online support groups, medication/co-pay assistance.   1-800-813-HOPE (4673) ? [facility] Assists [place] cancer patients and their families through emotional , educational and financial support.  336-427-4357 ? [place] DSS Where to apply for food stamps, Medicaid and utility assistance. 336-342-1394 ? [facility]: Transportation to medical appointments. 336-347-2287 ? Social Security Administration: May apply for disability if have a Stage IV cancer. 336-342-7796 1-800-772-1213 ? [place] Aging, Disability and Transit Services: Assists with nutrition, care and transit needs. 336-349-2343         

## 2019-04-10 NOTE — Progress Notes (Signed)
0930 Labs reviewed with and pt seen by Dr. Delton Coombes and pt approved for chemo tx today per MD                                                                                     Mikael Spray tolerated chemo tx well without complaints or incident. Pt discharged with 5FU pump infusing without issues. VSS upon discharge. Pt discharged self ambulatory in satisfactory condition

## 2019-04-10 NOTE — Assessment & Plan Note (Signed)
1.  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  Biopsy was consistent with invasive poorly differentiated adenocarcinoma.  Small polyp was found in the proximal sigmoid colon which was sessile.  External hemorrhoids were present. -CT of the abdomen and pelvis on 12/16/2018 showing a 4.2 x 2.9 cm near circumferential apple core type mass involving the proximal transverse colon with a few small lymph nodes noted in the pericolonic fat.  No retroperitoneal adenopathy or evidence of liver mets. -Laparoscopic right colectomy on 01/09/2019, pathology showing 4 cm moderately differentiated invasive adenocarcinoma, grade 2, 1/25 lymph nodes positive, no tumor deposits, no perineural invasion.  Tumor extends through serosa into adherent omentum.  Margins are negative.  - MMR protein IHC shows loss of nuclear expression of MLH1.  MSI was high by PCR.  BRAF testing was negative. - PET CT scan on 01/31/2019 shows focal hypermetabolic activity identified at the ileocolic anastomosis with SUV max of 9.  This could be related to postsurgical changes, but follow-up as needed.  Low-level FDG uptake in the midline scar likely related to healing. -CEA level on 01/18/2019 was mildly elevated at 5.1. - 3 cycles of adjuvant FOLFOX from 03/01/2019 through 03/27/2019. -She had experienced some nausea and vomited once.  Cold sensitivity lasted about 4 days.  Tiredness lasted about 1 week.  She lost about 1 pound of weight.   - MSI-high profile by PCR.  BRAF mutation was negative.  MLH1 hyper methylation was present.  Even though there have been a few reported cases of germline hyper methylation of the CPG islands on the MLH1 promoter region, the presence of such hyper methylation has been documented in approximately 20% of sporadic colorectal cancers, which correlated with loss of MLH1 expression and  high MSI.  Thus, tumors exhibiting IHC loss of MLH1 and the presence of MLH1 promoter methylation are presumed more likely to be a sporadic cancer. -I have reviewed her blood work.  She may proceed with cycle 4 without any dose modifications. -She has developed mild leukopenia, however ANC is 1.7.  Platelet count has also decreased to 100 K. -We will see her back in 2 weeks for follow-up.  2.  Macrocytosis: -She has macrocytosis prior to start of therapy.  Work-up was negative. -Macrocytosis has improved at this time.

## 2019-04-10 NOTE — Progress Notes (Signed)
Andrea Simon, Andrea Simon [id number]   CLINIC:  Medical Oncology/Hematology  PCP:  Fayrene Helper, MD [address] [place] [address] [phone]   REASON FOR VISIT:  Follow-up for colon cancer   BRIEF ONCOLOGIC HISTORY:    Colon cancer ([place])   01/09/2019 Initial Diagnosis    Colon cancer ([place])    03/01/2019 -  Chemotherapy    The patient had palonosetron (ALOXI) injection 0.25 mg, 0.25 mg, Intravenous,  Once, 4 of 12 cycles Administration: 0.25 mg (03/01/2019), 0.25 mg (03/15/2019), 0.25 mg (03/27/2019), 0.25 mg (04/10/2019) leucovorin 600 mg in dextrose 5 % 250 mL infusion, 640 mg, Intravenous,  Once, 4 of 12 cycles Administration: 600 mg (03/01/2019), 600 mg (03/15/2019), 640 mg (03/27/2019), 640 mg (04/10/2019) oxaliplatin (ELOXATIN) 135 mg in dextrose 5 % 500 mL chemo infusion, 85 mg/m2 = 135 mg, Intravenous,  Once, 4 of 12 cycles Administration: 135 mg (03/01/2019), 135 mg (03/15/2019), 135 mg (03/27/2019), 135 mg (04/10/2019) fluorouracil (ADRUCIL) chemo injection 650 mg, 400 mg/m2 = 650 mg, Intravenous,  Once, 4 of 12 cycles Administration: 650 mg (03/01/2019), 650 mg (03/15/2019), 650 mg (03/27/2019), 650 mg (04/10/2019) fluorouracil (ADRUCIL) 3,850 mg in sodium chloride 0.9 % 73 mL chemo infusion, 2,400 mg/m2 = 3,850 mg, Intravenous, 1 Day/Dose, 4 of 12 cycles Administration: 3,850 mg (03/01/2019), 3,850 mg (03/15/2019), 3,850 mg (03/27/2019), 3,850 mg (04/10/2019)  for chemotherapy treatment.       CANCER STAGING: Cancer Staging No matching staging information was found for the patient.   INTERVAL HISTORY:  Andrea Simon 63 y.o. female returns for routine follow-up and consideration for next cycle of chemotherapy. She is here today alone. She states that she had cold sensitivity for three days after her treatment. She states that she has noticed palpitations since her last visit. She states that she has had nausea and diarrhea, taking  compazine for nausea and had some relief. She states that she has also noticed discoloration in her hands and feet, as well as dry palms and feet. Denies any vomiting. Denies any new pains. Had not noticed any recent bleeding such as epistaxis, hematuria or hematochezia. Denies recent chest pain on exertion, shortness of breath on minimal exertion, pre-syncopal episodes, or palpitations. Denies any numbness or tingling in hands or feet. Denies any recent fevers, infections, or recent hospitalizations. Patient reports appetite at 50% and energy level at 25%.      REVIEW OF SYSTEMS:  Review of Systems  Cardiovascular: Positive for palpitations.  Gastrointestinal: Positive for diarrhea and nausea.  Psychiatric/Behavioral: Positive for sleep disturbance.     PAST MEDICAL/SURGICAL HISTORY:  Past Medical History:  Diagnosis Date  . Hyperlipidemia   . Multinodular goiter   . Prediabetes    Past Surgical History:  Procedure Laterality Date  . BIOPSY  12/07/2018   Procedure: BIOPSY;  Surgeon: Rogene Houston, MD;  [facility];  Service: Endoscopy;;  sigmoid colon  . COLON RESECTION N/A 01/09/2019   Procedure: LAPAROSCOPIC RIGHT HEMICOLECTOMY;  Surgeon: Virl Cagey, MD;  Location: [facility] ORS;  Service: General;  Laterality: N/A;  . COLONOSCOPY N/A 12/07/2018   Procedure: COLONOSCOPY;  Surgeon: Rogene Houston, MD;  Location: [facility];  Service: Endoscopy;  Laterality: N/A;  12:45  . DILATION AND CURETTAGE OF UTERUS  2006  . FNA thyroid  2011 and 2019   benign  . POLYPECTOMY  12/07/2018   Procedure: POLYPECTOMY;  Surgeon: Rogene Houston,  MD;  Location: [facility];  Service: Endoscopy;;  splenic flexure (CS x1)  . PORTACATH PLACEMENT Left 02/15/2019   Procedure: INSERTION PORT-A-CATH;  Surgeon: Virl Cagey, MD;  Location: [facility];  Service: General;  Laterality: Left;  . TUBAL LIGATION  1992     SOCIAL HISTORY:  Social History   Socioeconomic History  .  Marital status: Married    Spouse name: Not on file  . Number of children: 1  . Years of education: Not on file  . Highest education level: Not on file  Occupational History  . Occupation: [employer]  . Financial resource strain: Not on file  . Food insecurity:    Worry: Not on file    Inability: Not on file  . Transportation needs:    Medical: Not on file    Non-medical: Not on file  Tobacco Use  . Smoking status: Current Every Day Smoker    Packs/day: 0.75    Years: 45.00    Pack years: 33.75    Types: Cigarettes  . Smokeless tobacco: Never Used  Substance and Sexual Activity  . Alcohol use: Yes    Alcohol/week: 0.0 standard drinks    Comment: occasionally  . Drug use: No  . Sexual activity: Yes  Lifestyle  . Physical activity:    Days per week: Not on file    Minutes per session: Not on file  . Stress: Not on file  Relationships  . Social connections:    Talks on phone: Not on file    Gets together: Not on file    Attends religious service: Not on file    Active member of club or organization: Not on file    Attends meetings of clubs or organizations: Not on file    Relationship status: Not on file  . Intimate partner violence:    Fear of current or ex partner: Not on file    Emotionally abused: Not on file    Physically abused: Not on file    Forced sexual activity: Not on file  Other Topics Concern  . Not on file  Social History Narrative  . Not on file    FAMILY HISTORY:  Family History  Problem Relation Age of Onset  . Hypertension Mother        AAA  . Coronary artery disease Mother   . Hyperlipidemia Mother   . Cancer Mother        lung  . Hypertension Father   . Cancer Brother     CURRENT MEDICATIONS:  Outpatient Encounter Medications as of 04/10/2019  Medication Sig Note  . acetaminophen (TYLENOL) 500 MG tablet Take 1,000 mg by mouth every 6 (six) hours as needed for moderate pain.    Marland Kitchen ALPRAZolam (XANAX)  0.25 MG tablet Take one tablet at bedtime, as needed , for anxiety (Patient taking differently: Take 0.25 mg by mouth at bedtime as needed for anxiety. Take one tablet at bedtime, as needed , for anxiety)   . aspirin EC 81 MG tablet Take 1 tablet (81 mg total) by mouth every evening.   . Calcium Carbonate-Vitamin D (CALCIUM 600+D) 600-400 MG-UNIT tablet Take 1 tablet by mouth 2 (two) times a week. Sunday and Wednesday evenings   . docusate sodium (COLACE) 100 MG capsule Take 1 capsule (100 mg total) by mouth 2 (two) times daily.   . fluorouracil CALGB 85462 in sodium chloride 0.9 % 150 mL Inject into the vein  2 days. Every 14 days 02/02/2019: Has not started yet  . LEUCOVORIN CALCIUM IV Inject into the vein every 14 (fourteen) days. 02/02/2019: Has not started yet  . lidocaine-prilocaine (EMLA) cream Apply to port a cath site one hour prior to chemotherapy appointment to numb the skin over your port site. 02/02/2019: Has not started yet  . ondansetron (ZOFRAN-ODT) 4 MG disintegrating tablet Take 1 tablet (4 mg total) by mouth every 6 (six) hours as needed for nausea.   . OXALIPLATIN IV Inject into the vein every 14 (fourteen) days. 02/02/2019: Has not started yet  . pantoprazole (PROTONIX) 40 MG tablet Take 1 tablet (40 mg total) by mouth daily.   . pravastatin (PRAVACHOL) 40 MG tablet TAKE 1 TABLET BY MOUTH  DAILY IN THE EVENING (Patient taking differently: Take 40 mg by mouth 4 (four) times a week. )   . prochlorperazine (COMPAZINE) 10 MG tablet Take 1 tablet (10 mg total) by mouth every 6 (six) hours as needed (Nausea or vomiting). 02/02/2019: Has not picked up yet   No facility-administered encounter medications on file as of 04/10/2019.     ALLERGIES:  No Known Allergies   PHYSICAL EXAM:  ECOG Performance status: 1  Vitals:   04/10/19 0831  BP: 118/80  Pulse: 69  Resp: 18  Temp: 98 F (36.7 C)  SpO2: 100%   Filed Weights   04/10/19 0831  Weight: 123 lb 9.6 oz (56.1 kg)    Physical  Exam Vitals signs reviewed.  Constitutional:      Appearance: Normal appearance.  Cardiovascular:     Rate and Rhythm: Normal rate and regular rhythm.     Pulses: Normal pulses.     Heart sounds: Normal heart sounds.  Pulmonary:     Breath sounds: Normal breath sounds.  Abdominal:     General: There is no distension.     Palpations: Abdomen is soft. There is no mass.  Musculoskeletal:        General: No swelling.  Skin:    General: Skin is warm.  Neurological:     Mental Status: She is alert and oriented to person, place, and time.  Psychiatric:        Mood and Affect: Mood normal.        Behavior: Behavior normal.      LABORATORY DATA:  I have reviewed the labs as listed.  CBC    Component Value Date/Time   WBC 3.7 (L) 04/10/2019 0823   RBC 3.63 (L) 04/10/2019 0823   HGB 11.8 (L) 04/10/2019 0823   HGB 13.5 08/18/2018 0758   HCT 35.4 (L) 04/10/2019 0823   HCT 37.2 08/18/2018 0758   PLT 104 (L) 04/10/2019 0823   PLT 259 08/18/2018 0758   MCV 97.5 04/10/2019 0823   MCV 104 (H) 08/18/2018 0758   MCH 32.5 04/10/2019 0823   MCHC 33.3 04/10/2019 0823   RDW 14.0 04/10/2019 0823   RDW 13.0 08/18/2018 0758   LYMPHSABS 1.6 04/10/2019 0823   LYMPHSABS 3.5 (H) 08/31/2017 0955   MONOABS 0.4 04/10/2019 0823   EOSABS 0.0 04/10/2019 0823   EOSABS 0.1 08/31/2017 0955   BASOSABS 0.0 04/10/2019 0823   BASOSABS 0.0 08/31/2017 0955   CMP Latest Ref Rng & Units 04/10/2019 03/27/2019 03/15/2019  Glucose 70 - 99 mg/dL 113(H) 105(H) 128(H)  BUN 8 - 23 mg/dL '15 9 10  '$ Creatinine 0.44 - 1.00 mg/dL 0.72 0.66 0.68  Sodium 135 - 145 mmol/L 138 139 139  Potassium 3.5 -  5.1 mmol/L 3.5 3.7 3.3(L)  Chloride 98 - 111 mmol/L 105 106 108  CO2 22 - 32 mmol/L '24 24 25  '$ Calcium 8.9 - 10.3 mg/dL 9.3 9.6 9.6  Total Protein 6.5 - 8.1 g/dL 7.2 7.4 7.1  Total Bilirubin 0.3 - 1.2 mg/dL 0.8 0.6 0.8  Alkaline Phos 38 - 126 U/L 75 78 65  AST 15 - 41 U/L '25 24 25  '$ ALT 0 - 44 U/L '17 18 20        '$ DIAGNOSTIC IMAGING:  I have independently reviewed the scans and discussed with the patient.   I have reviewed Venita Lick LPN's note and agree with the documentation.  I personally performed a face-to-face visit, made revisions and my assessment and plan is as follows.    ASSESSMENT & PLAN:   Malignant neoplasm of transverse colon ([place]) 1.  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  Biopsy was consistent with invasive poorly differentiated adenocarcinoma.  Small polyp was found in the proximal sigmoid colon which was sessile.  External hemorrhoids were present. -CT of the abdomen and pelvis on 12/16/2018 showing a 4.2 x 2.9 cm near circumferential apple core type mass involving the proximal transverse colon with a few small lymph nodes noted in the pericolonic fat.  No retroperitoneal adenopathy or evidence of liver mets. -Laparoscopic right colectomy on 01/09/2019, pathology showing 4 cm moderately differentiated invasive adenocarcinoma, grade 2, 1/25 lymph nodes positive, no tumor deposits, no perineural invasion.  Tumor extends through serosa into adherent omentum.  Margins are negative.  - MMR protein IHC shows loss of nuclear expression of MLH1.  MSI was high by PCR.  BRAF testing was negative. - PET CT scan on 01/31/2019 shows focal hypermetabolic activity identified at the ileocolic anastomosis with SUV max of 9.  This could be related to postsurgical changes, but follow-up as needed.  Low-level FDG uptake in the midline scar likely related to healing. -CEA level on 01/18/2019 was mildly elevated at 5.1. - 3 cycles of adjuvant FOLFOX from 03/01/2019 through 03/27/2019. -She had experienced some nausea and vomited once.  Cold sensitivity lasted about 4 days.  Tiredness lasted about 1 week.  She lost about 1 pound of weight.   - MSI-high profile by PCR.  BRAF  mutation was negative.  MLH1 hyper methylation was present.  Even though there have been a few reported cases of germline hyper methylation of the CPG islands on the MLH1 promoter region, the presence of such hyper methylation has been documented in approximately 20% of sporadic colorectal cancers, which correlated with loss of MLH1 expression and high MSI.  Thus, tumors exhibiting IHC loss of MLH1 and the presence of MLH1 promoter methylation are presumed more likely to be a sporadic cancer. -I have reviewed her blood work.  She may proceed with cycle 4 without any dose modifications. -She has developed mild leukopenia, however ANC is 1.7.  Platelet count has also decreased to 100 K. -We will see her back in 2 weeks for follow-up.  2.  Macrocytosis: -She has macrocytosis prior to start of therapy.  Work-up was negative. -Macrocytosis has improved at this time.  Total time spent is 25 minutes with more than 50% of the time spent face-to-face discussing and reinforcing treatment plan, side effects and coordination of care.    Orders placed this encounter:  No orders of the defined types were placed in this  encounter.     Derek Jack, MD [place] [phone]

## 2019-04-10 NOTE — Patient Instructions (Addendum)
[address] at [facility] Discharge Instructions  You were seen today by Dr. Delton Coombes. He went over your recent lab results. He will see you back in 2 weeks for labs, treatment and follow up. He would like you to start taking your nausea medication every 6 hours for the next three days to help prevent nausea.    Thank you for choosing [place] at [facility] to provide your oncology and hematology care.  To afford each patient quality time with our provider, please arrive at least 15 minutes before your scheduled appointment time.   If you have a lab appointment with the [facility] please come in thru the  Main Entrance and check in at the main information desk  You need to re-schedule your appointment should you arrive 10 or more minutes late.  We strive to give you quality time with our providers, and arriving late affects you and other patients whose appointments are after yours.  Also, if you no show three or more times for appointments you may be dismissed from the clinic at the providers discretion.     Again, thank you for choosing [facility].  Our hope is that these requests will decrease the amount of time that you wait before being seen by our physicians.       _____________________________________________________________  Should you have questions after your visit to [facility], please contact our office at [phone] between the hours of 8:00 a.m. and 4:30 p.m.  Voicemails left after 4:00 p.m. will not be returned until the following business day.  For prescription refill requests, have your pharmacy contact our office and allow 72 hours.    Cancer Center Support Programs:   > Cancer Support Group  2nd Tuesday of the month 1pm-2pm, Journey Room

## 2019-04-12 ENCOUNTER — Other Ambulatory Visit: Payer: Self-pay

## 2019-04-12 ENCOUNTER — Encounter (HOSPITAL_COMMUNITY): Payer: Self-pay

## 2019-04-12 ENCOUNTER — Inpatient Hospital Stay (HOSPITAL_COMMUNITY): Payer: 59

## 2019-04-12 VITALS — BP 113/58 | HR 83 | Temp 98.3°F | Resp 18

## 2019-04-12 DIAGNOSIS — Z452 Encounter for adjustment and management of vascular access device: Secondary | ICD-10-CM | POA: Diagnosis not present

## 2019-04-12 DIAGNOSIS — C184 Malignant neoplasm of transverse colon: Secondary | ICD-10-CM

## 2019-04-12 MED ORDER — HEPARIN SOD (PORK) LOCK FLUSH 100 UNIT/ML IV SOLN
500.0000 [IU] | Freq: Once | INTRAVENOUS | Status: AC | PRN
  Administered 2019-04-12: 500 [IU]

## 2019-04-12 MED ORDER — SODIUM CHLORIDE 0.9% FLUSH
10.0000 mL | INTRAVENOUS | Status: DC | PRN
  Administered 2019-04-12: 14:00:00 10 mL
  Filled 2019-04-12: qty 10

## 2019-04-12 NOTE — Progress Notes (Signed)
Andrea Simon returns today for port de access and flush after 46 hr continous infusion of 93fu. Tolerated infusion without problems. Portacath located left chest wall was  deaccessed and flushed with 73ml NS and 500U/75ml Heparin and needle removed intact.  Procedure without incident. Patient tolerated procedure well.  Vitals stable and discharged home from clinic ambulatory. Follow up as scheduled.

## 2019-04-13 IMAGING — CR DG ABDOMEN 1V
1 series · 1 of 1 positions shown · non-contrast
Comparison: 12/16/2018 CT abdomen/pelvis

CLINICAL DATA: Nausea and vomiting, status post right hemicolectomy
2 days prior

EXAM:
ABDOMEN - 1 VIEW

[supine ap]
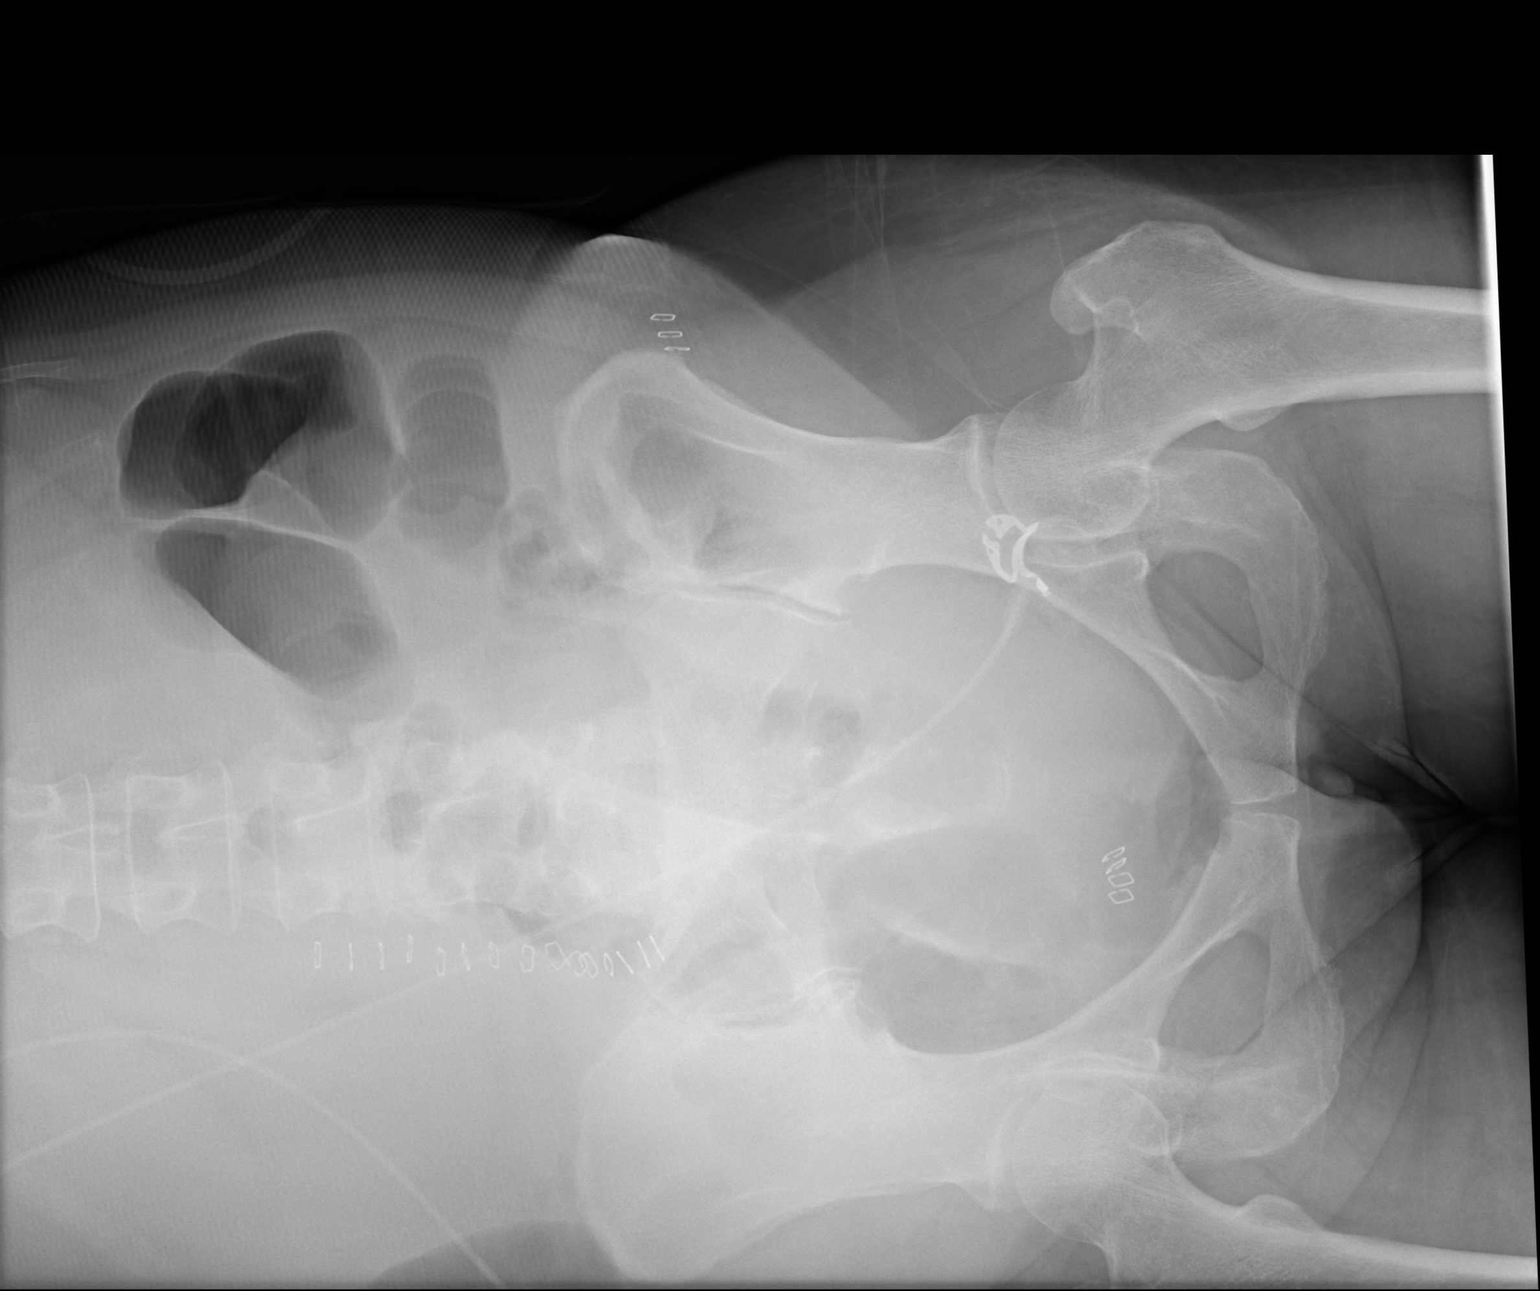

[1 of 1 positions shown; findings below may reference images not displayed]

FINDINGS: Vertical skin staples are noted to the right of the lumbar spine and
overlying the left lower quadrant and deep pelvis just to the right
of midline. There is mild dilatation of small and large bowel loops
throughout the abdomen. No evidence of pneumatosis or
pneumoperitoneum. No radiopaque nephrolithiasis.
IMPRESSION: Mild dilatation of small and large bowel loops throughout the
abdomen, compatible with mild postoperative adynamic ileus.

## 2019-04-17 ENCOUNTER — Telehealth (HOSPITAL_COMMUNITY): Payer: Self-pay

## 2019-04-17 NOTE — Telephone Encounter (Signed)
The patients sister, Vonzell Schlatter, called concerning side effects of the chemotherapy.  Reviewed the side effects of FOLFOX with understanding verbalized.    The sister also mentioned Mrs. Buser will complaint of "extra drool and a thick tongue feeling" a couple of days after the pump is removed.  The patient is not having any problems breathing, swallowing, or thick tongue at this time.  It only happens after the chemotherapy pump is removed and last for two days per the sister.  Instructed the sister a note would be sent to the doctor for the next appointment on Apr 26, 2019.

## 2019-04-24 ENCOUNTER — Ambulatory Visit (HOSPITAL_COMMUNITY): Payer: 59

## 2019-04-24 ENCOUNTER — Other Ambulatory Visit (HOSPITAL_COMMUNITY): Payer: 59

## 2019-04-24 ENCOUNTER — Ambulatory Visit (HOSPITAL_COMMUNITY): Payer: 59 | Admitting: Hematology

## 2019-04-26 ENCOUNTER — Encounter (HOSPITAL_COMMUNITY): Payer: 59

## 2019-04-26 ENCOUNTER — Other Ambulatory Visit: Payer: Self-pay

## 2019-04-26 ENCOUNTER — Inpatient Hospital Stay (HOSPITAL_COMMUNITY): Payer: 59 | Attending: Hematology

## 2019-04-26 ENCOUNTER — Encounter (HOSPITAL_COMMUNITY): Payer: Self-pay | Admitting: Hematology

## 2019-04-26 ENCOUNTER — Inpatient Hospital Stay (HOSPITAL_COMMUNITY): Payer: 59

## 2019-04-26 ENCOUNTER — Inpatient Hospital Stay (HOSPITAL_BASED_OUTPATIENT_CLINIC_OR_DEPARTMENT_OTHER): Payer: 59 | Admitting: Hematology

## 2019-04-26 DIAGNOSIS — D7589 Other specified diseases of blood and blood-forming organs: Secondary | ICD-10-CM | POA: Diagnosis not present

## 2019-04-26 DIAGNOSIS — Z8249 Family history of ischemic heart disease and other diseases of the circulatory system: Secondary | ICD-10-CM | POA: Diagnosis not present

## 2019-04-26 DIAGNOSIS — D701 Agranulocytosis secondary to cancer chemotherapy: Secondary | ICD-10-CM | POA: Diagnosis not present

## 2019-04-26 DIAGNOSIS — R42 Dizziness and giddiness: Secondary | ICD-10-CM | POA: Insufficient documentation

## 2019-04-26 DIAGNOSIS — F419 Anxiety disorder, unspecified: Secondary | ICD-10-CM | POA: Insufficient documentation

## 2019-04-26 DIAGNOSIS — Z5189 Encounter for other specified aftercare: Secondary | ICD-10-CM | POA: Diagnosis not present

## 2019-04-26 DIAGNOSIS — R002 Palpitations: Secondary | ICD-10-CM | POA: Insufficient documentation

## 2019-04-26 DIAGNOSIS — Z7982 Long term (current) use of aspirin: Secondary | ICD-10-CM

## 2019-04-26 DIAGNOSIS — K5900 Constipation, unspecified: Secondary | ICD-10-CM | POA: Insufficient documentation

## 2019-04-26 DIAGNOSIS — G620 Drug-induced polyneuropathy: Secondary | ICD-10-CM | POA: Insufficient documentation

## 2019-04-26 DIAGNOSIS — D696 Thrombocytopenia, unspecified: Secondary | ICD-10-CM | POA: Insufficient documentation

## 2019-04-26 DIAGNOSIS — G479 Sleep disorder, unspecified: Secondary | ICD-10-CM | POA: Insufficient documentation

## 2019-04-26 DIAGNOSIS — C184 Malignant neoplasm of transverse colon: Secondary | ICD-10-CM | POA: Insufficient documentation

## 2019-04-26 DIAGNOSIS — R471 Dysarthria and anarthria: Secondary | ICD-10-CM | POA: Diagnosis not present

## 2019-04-26 DIAGNOSIS — E785 Hyperlipidemia, unspecified: Secondary | ICD-10-CM | POA: Diagnosis not present

## 2019-04-26 DIAGNOSIS — Z5111 Encounter for antineoplastic chemotherapy: Secondary | ICD-10-CM | POA: Insufficient documentation

## 2019-04-26 DIAGNOSIS — Z79899 Other long term (current) drug therapy: Secondary | ICD-10-CM | POA: Insufficient documentation

## 2019-04-26 DIAGNOSIS — Z452 Encounter for adjustment and management of vascular access device: Secondary | ICD-10-CM | POA: Diagnosis present

## 2019-04-26 DIAGNOSIS — R5383 Other fatigue: Secondary | ICD-10-CM | POA: Diagnosis not present

## 2019-04-26 DIAGNOSIS — F17210 Nicotine dependence, cigarettes, uncomplicated: Secondary | ICD-10-CM

## 2019-04-26 LAB — COMPREHENSIVE METABOLIC PANEL
ALT: 25 U/L (ref 0–44)
AST: 36 U/L (ref 15–41)
Albumin: 4.2 g/dL (ref 3.5–5.0)
Alkaline Phosphatase: 75 U/L (ref 38–126)
Anion gap: 12 (ref 5–15)
BUN: 7 mg/dL — ABNORMAL LOW (ref 8–23)
CO2: 24 mmol/L (ref 22–32)
Calcium: 9.5 mg/dL (ref 8.9–10.3)
Chloride: 104 mmol/L (ref 98–111)
Creatinine, Ser: 0.72 mg/dL (ref 0.44–1.00)
GFR calc Af Amer: >60 mL/min (ref >60)
GFR calc non Af Amer: >60 mL/min (ref >60)
Glucose, Bld: 122 mg/dL — ABNORMAL HIGH (ref 70–99)
Potassium: 3.6 mmol/L (ref 3.5–5.1)
Sodium: 140 mmol/L (ref 135–145)
Total Bilirubin: 1.1 mg/dL (ref 0.3–1.2)
Total Protein: 7.4 g/dL (ref 6.5–8.1)

## 2019-04-26 LAB — CBC WITH DIFFERENTIAL/PLATELET
Abs Immature Granulocytes: 0.01 10*3/uL (ref 0.00–0.07)
Basophils Absolute: 0 10*3/uL (ref 0.0–0.1)
Basophils Relative: 1 %
Eosinophils Absolute: 0 10*3/uL (ref 0.0–0.5)
Eosinophils Relative: 1 %
HCT: 35.9 % — ABNORMAL LOW (ref 36.0–46.0)
Hemoglobin: 11.6 g/dL — ABNORMAL LOW (ref 12.0–15.0)
Immature Granulocytes: 0 %
Lymphocytes Relative: 51 %
Lymphs Abs: 1.5 10*3/uL (ref 0.7–4.0)
MCH: 31.5 pg (ref 26.0–34.0)
MCHC: 32.3 g/dL (ref 30.0–36.0)
MCV: 97.6 fL (ref 80.0–100.0)
Monocytes Absolute: 0.3 10*3/uL (ref 0.1–1.0)
Monocytes Relative: 10 %
Neutro Abs: 1.1 10*3/uL — ABNORMAL LOW (ref 1.7–7.7)
Neutrophils Relative %: 37 %
Platelets: 69 10*3/uL — ABNORMAL LOW (ref 150–400)
RBC: 3.68 MIL/uL — ABNORMAL LOW (ref 3.87–5.11)
RDW: 14.4 % (ref 11.5–15.5)
WBC: 2.9 10*3/uL — ABNORMAL LOW (ref 4.0–10.5)
nRBC: 0 % (ref 0.0–0.2)

## 2019-04-26 MED ORDER — HEPARIN SOD (PORK) LOCK FLUSH 100 UNIT/ML IV SOLN
500.0000 [IU] | Freq: Once | INTRAVENOUS | Status: AC | PRN
  Administered 2019-04-26: 500 [IU]

## 2019-04-26 MED ORDER — SODIUM CHLORIDE 0.9% FLUSH
10.0000 mL | INTRAVENOUS | Status: DC | PRN
  Administered 2019-04-26: 10 mL
  Filled 2019-04-26: qty 10

## 2019-04-26 NOTE — Assessment & Plan Note (Addendum)
1.  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  Biopsy was consistent with invasive poorly differentiated adenocarcinoma.  Small polyp was found in the proximal sigmoid colon which was sessile.  External hemorrhoids were present. -CT of the abdomen and pelvis on 12/16/2018 showing a 4.2 x 2.9 cm near circumferential apple core type mass involving the proximal transverse colon with a few small lymph nodes noted in the pericolonic fat.  No retroperitoneal adenopathy or evidence of liver mets. -Laparoscopic right colectomy on 01/09/2019, pathology showing 4 cm moderately differentiated invasive adenocarcinoma, grade 2, 1/25 lymph nodes positive, no tumor deposits, no perineural invasion.  Tumor extends through serosa into adherent omentum.  Margins are negative.  - MMR protein IHC shows loss of nuclear expression of MLH1.  MSI was high by PCR.  BRAF testing was negative. - PET CT scan on 01/31/2019 shows focal hypermetabolic activity identified at the ileocolic anastomosis with SUV max of 9.  This could be related to postsurgical changes, but follow-up as needed.  Low-level FDG uptake in the midline scar likely related to healing. -CEA level on 01/18/2019 was mildly elevated at 5.1. - MSI-high profile by PCR.  BRAF mutation was negative.  MLH1 hyper methylation was present.  Even though there have been a few reported cases of germline hyper methylation of the CPG islands on the MLH1 promoter region, the presence of such hyper methylation has been documented in approximately 20% of sporadic colorectal cancers, which correlated with loss of MLH1 expression and high MSI.  Thus, tumors exhibiting IHC loss of MLH1 and the presence of MLH1 promoter methylation are presumed more likely to be a sporadic cancer. -4 cycles of adjuvant FOLFOX from 03/01/2019 through 04/10/2019. - We will hold  Cycle 5 today due to neutropenia.  - Of note, she describes akinesia of her fingers and dysarthria that occurs for several days after treatment and then resolves. It is unclear if this is a side effect of Compazine or Oxaliplatin. Recommend change antiemetic to Zofran and we will also decrease Oxaliplatin dose by 20% and slow down infusion to 3 hours instead of 2 hour infusion.  - She will RTC in 1 weeks for repeat labs and treatment.   2.  Macrocytosis: -She has macrocytosis prior to start of therapy.  Work-up was negative. -Macrocytosis has improved at this time.

## 2019-04-26 NOTE — Patient Instructions (Addendum)
[facility] at [facility] Discharge Instructions  You were seen today by Dr. Katragadda. He went over your recent lab results. He will see you back in  for labs and follow up.   Thank you for choosing [facility] at [facility] to provide your oncology and hematology care.  To afford each patient quality time with our provider, please arrive at least 15 minutes before your scheduled appointment time.   If you have a lab appointment with the Cancer Center please come in thru the  Main Entrance and check in at the main information desk  You need to re-schedule your appointment should you arrive 10 or more minutes late.  We strive to give you quality time with our providers, and arriving late affects you and other patients whose appointments are after yours.  Also, if you no show three or more times for appointments you may be dismissed from the clinic at the providers discretion.     Again, thank you for choosing [facility].  Our hope is that these requests will decrease the amount of time that you wait before being seen by our physicians.       _____________________________________________________________  Should you have questions after your visit to [facility], please contact our office at [phone] between the hours of 8:00 a.m. and 4:30 p.m.  Voicemails left after 4:00 p.m. will not be returned until the following business day.  For prescription refill requests, have your pharmacy contact our office and allow 72 hours.    Cancer Center Support Programs:   > Cancer Support Group  2nd Tuesday of the month 1pm-2pm, Journey Room    

## 2019-04-26 NOTE — Progress Notes (Signed)
[address] [place], [place] [address]   CLINIC:  Medical Oncology/Hematology  PCP:  Fayrene Helper, MD [address] [place] [address] [phone]   REASON FOR VISIT:  Follow-up for colon cancer      BRIEF ONCOLOGIC HISTORY:    Colon cancer ([place])   01/09/2019 Initial Diagnosis    Colon cancer ([place])    03/01/2019 -  Chemotherapy    The patient had palonosetron (ALOXI) injection 0.25 mg, 0.25 mg, Intravenous,  Once, 5 of 12 cycles Administration: 0.25 mg (03/01/2019), 0.25 mg (03/15/2019), 0.25 mg (03/27/2019), 0.25 mg (04/10/2019) leucovorin 600 mg in dextrose 5 % 250 mL infusion, 640 mg, Intravenous,  Once, 5 of 12 cycles Administration: 600 mg (03/01/2019), 600 mg (03/15/2019), 640 mg (03/27/2019), 640 mg (04/10/2019) oxaliplatin (ELOXATIN) 135 mg in dextrose 5 % 500 mL chemo infusion, 85 mg/m2 = 135 mg, Intravenous,  Once, 5 of 12 cycles Administration: 135 mg (03/01/2019), 135 mg (03/15/2019), 135 mg (03/27/2019), 135 mg (04/10/2019) fluorouracil (ADRUCIL) chemo injection 650 mg, 400 mg/m2 = 650 mg, Intravenous,  Once, 5 of 12 cycles Administration: 650 mg (03/01/2019), 650 mg (03/15/2019), 650 mg (03/27/2019), 650 mg (04/10/2019) fluorouracil (ADRUCIL) 3,850 mg in sodium chloride 0.9 % 73 mL chemo infusion, 2,400 mg/m2 = 3,850 mg, Intravenous, 1 Day/Dose, 5 of 12 cycles Administration: 3,850 mg (03/01/2019), 3,850 mg (03/15/2019), 3,850 mg (03/27/2019), 3,850 mg (04/10/2019)  for chemotherapy treatment.       CANCER STAGING: Cancer Staging No matching staging information was found for the patient.   INTERVAL HISTORY:  Ms. Groll 63 y.o. female returns for routine follow-up and consideration for next cycle of chemotherapy. She is here today alone. She states that she still has numbness and tingling in her hands and are stiff, worse in her right hand. She has noticed dizziness and unsteadiness at times. She states that on the 2nd day after  treatment her tongue feels thick and her saliva increases, drooling at times, as well as difficulty speaking. She continues to have constipation and takes stool softener daily. Denies any vomiting, or diarrhea. Denies any new pains. Had not noticed any recent bleeding such as epistaxis, hematuria or hematochezia. Denies recent chest pain on exertion, shortness of breath on minimal exertion, pre-syncopal episodes, or palpitations. Denies any recent fevers, infections, or recent hospitalizations. Patient reports appetite at 75% and energy level at 75%.      REVIEW OF SYSTEMS:  Review of Systems  Constitutional: Positive for fatigue.  Cardiovascular: Positive for palpitations.  Gastrointestinal: Positive for constipation.  Neurological: Positive for dizziness and numbness.  Psychiatric/Behavioral: Positive for depression and sleep disturbance.     PAST MEDICAL/SURGICAL HISTORY:  Past Medical History:  Diagnosis Date   Hyperlipidemia    Multinodular goiter    Prediabetes    Past Surgical History:  Procedure Laterality Date   BIOPSY  12/07/2018   Procedure: BIOPSY;  Surgeon: Rogene Houston, MD;  [facility];  Service: Endoscopy;;  sigmoid colon   COLON RESECTION N/A 01/09/2019   Procedure: LAPAROSCOPIC RIGHT HEMICOLECTOMY;  Surgeon: Virl Cagey, MD;  Location: [facility];  Service: General;  Laterality: N/A;   COLONOSCOPY N/A 12/07/2018   Procedure: COLONOSCOPY;  Surgeon: Rogene Houston, MD;  Location: [facility];  Service: Endoscopy;  Laterality: N/A;  12:45   [place] OF UTERUS  2006   FNA thyroid  2011 and 2019   benign   POLYPECTOMY  12/07/2018   Procedure: POLYPECTOMY;  Surgeon: Rogene Houston, MD;  Location: [facility];  Service: Endoscopy;;  splenic flexure (CS x1)   PORTACATH PLACEMENT Left 02/15/2019   Procedure: INSERTION PORT-A-CATH;  Surgeon: Virl Cagey, MD;  Location: [facility];  Service: General;  Laterality: Left;    TUBAL LIGATION  1992     SOCIAL HISTORY:  Social History   Socioeconomic History   Marital status: Married    Spouse name: Not on file   Number of children: 1   Years of education: Not on file   Highest education level: Not on file  Occupational History   Occupation: [employer]: Not on file   Food insecurity:    Worry: Not on file    Inability: Not on file   Transportation needs:    Medical: Not on file    Non-medical: Not on file  Tobacco Use   Smoking status: Current Every Day Smoker    Packs/day: 0.75    Years: 45.00    Pack years: 33.75    Types: Cigarettes   Smokeless tobacco: Never Used  Substance and Sexual Activity   Alcohol use: Yes    Alcohol/week: 0.0 standard drinks    Comment: occasionally   Drug use: No   Sexual activity: Yes  Lifestyle   Physical activity:    Days per week: Not on file    Minutes per session: Not on file   Stress: Not on file  Relationships   Social connections:    Talks on phone: Not on file    Gets together: Not on file    Attends religious service: Not on file    Active member of club or organization: Not on file    Attends meetings of clubs or organizations: Not on file    Relationship status: Not on file   Intimate partner violence:    Fear of current or ex partner: Not on file    Emotionally abused: Not on file    Physically abused: Not on file    Forced sexual activity: Not on file  Other Topics Concern   Not on file  Social History Narrative   Not on file    FAMILY HISTORY:  Family History  Problem Relation Age of Onset   Hypertension Mother        AAA   Coronary artery disease Mother    Hyperlipidemia Mother    Cancer Mother        lung   Hypertension Father    Cancer Brother     CURRENT MEDICATIONS:  Outpatient Encounter Medications as of 04/26/2019  Medication Sig Note   acetaminophen (TYLENOL) 500 MG tablet Take  1,000 mg by mouth every 6 (six) hours as needed for moderate pain.     ALPRAZolam (XANAX) 0.25 MG tablet Take one tablet at bedtime, as needed , for anxiety (Patient taking differently: Take 0.25 mg by mouth at bedtime as needed for anxiety. Take one tablet at bedtime, as needed , for anxiety)    aspirin EC 81 MG tablet Take 1 tablet (81 mg total) by mouth every evening.    Calcium Carbonate-Vitamin D (CALCIUM 600+D) 600-400 MG-UNIT tablet Take 1 tablet by mouth 2 (two) times a week. Sunday and Wednesday evenings    docusate sodium (COLACE) 100 MG capsule Take 1 capsule (100 mg total) by mouth 2 (two) times daily.    fluorouracil CALGB 35009 in  sodium chloride 0.9 % 150 mL Inject into the vein 2 days. Every 14 days 02/02/2019: Has not started yet   LEUCOVORIN CALCIUM IV Inject into the vein every 14 (fourteen) days. 02/02/2019: Has not started yet   lidocaine-prilocaine (EMLA) cream Apply to port a cath site one hour prior to chemotherapy appointment to numb the skin over your port site. 02/02/2019: Has not started yet   ondansetron (ZOFRAN-ODT) 4 MG disintegrating tablet Take 1 tablet (4 mg total) by mouth every 6 (six) hours as needed for nausea.    OXALIPLATIN IV Inject into the vein every 14 (fourteen) days. 02/02/2019: Has not started yet   pantoprazole (PROTONIX) 40 MG tablet Take 1 tablet (40 mg total) by mouth daily.    pravastatin (PRAVACHOL) 40 MG tablet TAKE 1 TABLET BY MOUTH  DAILY IN THE EVENING (Patient taking differently: Take 40 mg by mouth 4 (four) times a week. )    prochlorperazine (COMPAZINE) 10 MG tablet Take 1 tablet (10 mg total) by mouth every 6 (six) hours as needed (Nausea or vomiting). 02/02/2019: Has not picked up yet   No facility-administered encounter medications on file as of 04/26/2019.     ALLERGIES:  No Known Allergies   PHYSICAL EXAM:  ECOG Performance status: 1  Vitals:   04/26/19 0759  BP: (!) 140/95  Pulse: 75  Resp: 16  Temp: 98.2 F (36.8 C)   SpO2: 100%   Filed Weights   04/26/19 0759  Weight: 125 lb 3.2 oz (56.8 kg)    Physical Exam Vitals signs reviewed.  Constitutional:      Appearance: Normal appearance.  Cardiovascular:     Rate and Rhythm: Normal rate and regular rhythm.     Heart sounds: Normal heart sounds.  Pulmonary:     Effort: Pulmonary effort is normal.     Breath sounds: Normal breath sounds.  Abdominal:     General: There is no distension.     Palpations: Abdomen is soft. There is no mass.  Musculoskeletal:        General: No swelling.  Skin:    General: Skin is warm.  Neurological:     General: No focal deficit present.     Mental Status: She is alert and oriented to person, place, and time.  Psychiatric:        Mood and Affect: Mood normal.        Behavior: Behavior normal.      LABORATORY DATA:  I have reviewed the labs as listed.  CBC    Component Value Date/Time   WBC 2.9 (L) 04/26/2019 0754   RBC 3.68 (L) 04/26/2019 0754   HGB 11.6 (L) 04/26/2019 0754   HGB 13.5 08/18/2018 0758   HCT 35.9 (L) 04/26/2019 0754   HCT 37.2 08/18/2018 0758   PLT 69 (L) 04/26/2019 0754   PLT 259 08/18/2018 0758   MCV 97.6 04/26/2019 0754   MCV 104 (H) 08/18/2018 0758   MCH 31.5 04/26/2019 0754   MCHC 32.3 04/26/2019 0754   RDW 14.4 04/26/2019 0754   RDW 13.0 08/18/2018 0758   LYMPHSABS 1.5 04/26/2019 0754   LYMPHSABS 3.5 (H) 08/31/2017 0955   MONOABS 0.3 04/26/2019 0754   EOSABS 0.0 04/26/2019 0754   EOSABS 0.1 08/31/2017 0955   BASOSABS 0.0 04/26/2019 0754   BASOSABS 0.0 08/31/2017 0955   CMP Latest Ref Rng & Units 04/26/2019 04/10/2019 03/27/2019  Glucose 70 - 99 mg/dL 122(H) 113(H) 105(H)  BUN 8 - 23 mg/dL 7(L) 15  9  Creatinine 0.44 - 1.00 mg/dL 0.72 0.72 0.66  Sodium 135 - 145 mmol/L 140 138 139  Potassium 3.5 - 5.1 mmol/L 3.6 3.5 3.7  Chloride 98 - 111 mmol/L 104 105 106  CO2 22 - 32 mmol/L _0 Calcium 8.9 - 10.3 mg/dL 9.5 9.3 9.6  Total Protein 6.5 - 8.1 g/dL 7.4 7.2 7.4  Total  Bilirubin 0.3 - 1.2 mg/dL 1.1 0.8 0.6  Alkaline Phos 38 - 126 U/L 75 75 78  AST 15 - 41 U/L 36 25 24  ALT 0 - 44 U/L _1 DIAGNOSTIC IMAGING:  I have independently reviewed the scans and discussed with the patient.   I have reviewed Venita Lick LPN's note and agree with the documentation.  I personally performed a face-to-face visit, made revisions and my assessment and plan is as follows.    ASSESSMENT & PLAN:   Malignant neoplasm of transverse colon ([place]) 1.  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  Biopsy was consistent with invasive poorly differentiated adenocarcinoma.  Small polyp was found in the proximal sigmoid colon which was sessile.  External hemorrhoids were present. -CT of the abdomen and pelvis on 12/16/2018 showing a 4.2 x 2.9 cm near circumferential apple core type mass involving the proximal transverse colon with a few small lymph nodes noted in the pericolonic fat.  No retroperitoneal adenopathy or evidence of liver mets. -Laparoscopic right colectomy on 01/09/2019, pathology showing 4 cm moderately differentiated invasive adenocarcinoma, grade 2, 1/25 lymph nodes positive, no tumor deposits, no perineural invasion.  Tumor extends through serosa into adherent omentum.  Margins are negative.  - MMR protein IHC shows loss of nuclear expression of MLH1.  MSI was high by PCR.  BRAF testing was negative. - PET CT scan on 01/31/2019 shows focal hypermetabolic activity identified at the ileocolic anastomosis with SUV max of 9.  This could be related to postsurgical changes, but follow-up as needed.  Low-level FDG uptake in the midline scar likely related to healing. -CEA level on 01/18/2019 was mildly elevated at 5.1. - MSI-high profile by PCR.  BRAF mutation was negative.  MLH1 hyper methylation was present.  Even though there have  been a few reported cases of germline hyper methylation of the CPG islands on the MLH1 promoter region, the presence of such hyper methylation has been documented in approximately 20% of sporadic colorectal cancers, which correlated with loss of MLH1 expression and high MSI.  Thus, tumors exhibiting IHC loss of MLH1 and the presence of MLH1 promoter methylation are presumed more likely to be a sporadic cancer. -4 cycles of adjuvant FOLFOX from 03/01/2019 through 04/10/2019. - We will hold Cycle 5 today due to neutropenia.  - Of note, she describes akinesia of her fingers and dysarthria that occurs for several days after treatment and then resolves. It is unclear if this is a side effect of Compazine or Oxaliplatin. Recommend change antiemetic to Zofran and we will also decrease Oxaliplatin dose by 20% and slow down infusion to 3 hours instead of 2 hour infusion.  - She will RTC in 1 weeks for repeat labs and treatment.   2.  Macrocytosis: -She has macrocytosis prior to start of therapy.  Work-up was negative. -Macrocytosis has improved at this time.   Total time spent is 25 minutes with more than 50% of the  time spent face-to-face discussing treatment related side effects, management and coordination of care.  Orders placed this encounter:  No orders of the defined types were placed in this encounter.     Derek Jack, MD [place] [phone]

## 2019-04-26 NOTE — Progress Notes (Unsigned)
Pt presents today for doctor visit and treatment. VSS. Pt has complaints of numbness in her hands and increased saliva production. Pt states," My tongue feels thick like it's contracted."   VO received from Dr. Delton Coombes NO treatment today. Platelets 69, neutrophils 1.1. Scheduling instructed to put patient on next Wed with lab draw and physician appt. New schedule printed off and given to patient.   NO treatment today per MD orders. No complaints at this time. Discharged from clinic ambulatory. F/U with [facility] as scheduled.

## 2019-04-28 ENCOUNTER — Encounter (HOSPITAL_COMMUNITY): Payer: 59

## 2019-05-03 ENCOUNTER — Inpatient Hospital Stay (HOSPITAL_BASED_OUTPATIENT_CLINIC_OR_DEPARTMENT_OTHER): Payer: 59 | Admitting: Hematology

## 2019-05-03 ENCOUNTER — Telehealth (HOSPITAL_COMMUNITY): Payer: Self-pay | Admitting: Hematology

## 2019-05-03 ENCOUNTER — Inpatient Hospital Stay (HOSPITAL_COMMUNITY): Payer: 59

## 2019-05-03 ENCOUNTER — Encounter (HOSPITAL_COMMUNITY): Payer: Self-pay | Admitting: Hematology

## 2019-05-03 ENCOUNTER — Encounter (HOSPITAL_COMMUNITY): Payer: Self-pay

## 2019-05-03 ENCOUNTER — Other Ambulatory Visit: Payer: Self-pay

## 2019-05-03 DIAGNOSIS — F419 Anxiety disorder, unspecified: Secondary | ICD-10-CM

## 2019-05-03 DIAGNOSIS — R471 Dysarthria and anarthria: Secondary | ICD-10-CM | POA: Diagnosis not present

## 2019-05-03 DIAGNOSIS — K5900 Constipation, unspecified: Secondary | ICD-10-CM

## 2019-05-03 DIAGNOSIS — D701 Agranulocytosis secondary to cancer chemotherapy: Secondary | ICD-10-CM

## 2019-05-03 DIAGNOSIS — G620 Drug-induced polyneuropathy: Secondary | ICD-10-CM

## 2019-05-03 DIAGNOSIS — D7589 Other specified diseases of blood and blood-forming organs: Secondary | ICD-10-CM

## 2019-05-03 DIAGNOSIS — Z7982 Long term (current) use of aspirin: Secondary | ICD-10-CM

## 2019-05-03 DIAGNOSIS — R42 Dizziness and giddiness: Secondary | ICD-10-CM

## 2019-05-03 DIAGNOSIS — C184 Malignant neoplasm of transverse colon: Secondary | ICD-10-CM

## 2019-05-03 DIAGNOSIS — F17210 Nicotine dependence, cigarettes, uncomplicated: Secondary | ICD-10-CM

## 2019-05-03 DIAGNOSIS — G479 Sleep disorder, unspecified: Secondary | ICD-10-CM

## 2019-05-03 DIAGNOSIS — D696 Thrombocytopenia, unspecified: Secondary | ICD-10-CM

## 2019-05-03 DIAGNOSIS — Z5189 Encounter for other specified aftercare: Secondary | ICD-10-CM | POA: Diagnosis not present

## 2019-05-03 DIAGNOSIS — R002 Palpitations: Secondary | ICD-10-CM

## 2019-05-03 DIAGNOSIS — R5383 Other fatigue: Secondary | ICD-10-CM

## 2019-05-03 DIAGNOSIS — Z8249 Family history of ischemic heart disease and other diseases of the circulatory system: Secondary | ICD-10-CM

## 2019-05-03 LAB — COMPREHENSIVE METABOLIC PANEL
ALT: 25 U/L (ref 0–44)
AST: 33 U/L (ref 15–41)
Albumin: 4.2 g/dL (ref 3.5–5.0)
Alkaline Phosphatase: 72 U/L (ref 38–126)
Anion gap: 10 (ref 5–15)
BUN: 10 mg/dL (ref 8–23)
CO2: 25 mmol/L (ref 22–32)
Calcium: 9.7 mg/dL (ref 8.9–10.3)
Chloride: 105 mmol/L (ref 98–111)
Creatinine, Ser: 0.65 mg/dL (ref 0.44–1.00)
GFR calc Af Amer: >60 mL/min (ref >60)
GFR calc non Af Amer: >60 mL/min (ref >60)
Glucose, Bld: 99 mg/dL (ref 70–99)
Potassium: 3.6 mmol/L (ref 3.5–5.1)
Sodium: 140 mmol/L (ref 135–145)
Total Bilirubin: 0.8 mg/dL (ref 0.3–1.2)
Total Protein: 7.2 g/dL (ref 6.5–8.1)

## 2019-05-03 LAB — CBC WITH DIFFERENTIAL/PLATELET
Abs Immature Granulocytes: 0.02 10*3/uL (ref 0.00–0.07)
Basophils Absolute: 0 10*3/uL (ref 0.0–0.1)
Basophils Relative: 1 %
Eosinophils Absolute: 0 10*3/uL (ref 0.0–0.5)
Eosinophils Relative: 1 %
HCT: 36.8 % (ref 36.0–46.0)
Hemoglobin: 12 g/dL (ref 12.0–15.0)
Immature Granulocytes: 1 %
Lymphocytes Relative: 54 %
Lymphs Abs: 1.9 10*3/uL (ref 0.7–4.0)
MCH: 31.7 pg (ref 26.0–34.0)
MCHC: 32.6 g/dL (ref 30.0–36.0)
MCV: 97.1 fL (ref 80.0–100.0)
Monocytes Absolute: 0.4 10*3/uL (ref 0.1–1.0)
Monocytes Relative: 11 %
Neutro Abs: 1.1 10*3/uL — ABNORMAL LOW (ref 1.7–7.7)
Neutrophils Relative %: 32 %
Platelets: 167 10*3/uL (ref 150–400)
RBC: 3.79 MIL/uL — ABNORMAL LOW (ref 3.87–5.11)
RDW: 15.5 % (ref 11.5–15.5)
WBC: 3.3 10*3/uL — ABNORMAL LOW (ref 4.0–10.5)
nRBC: 0 % (ref 0.0–0.2)

## 2019-05-03 MED ORDER — SODIUM CHLORIDE 0.9% FLUSH
10.0000 mL | Freq: Once | INTRAVENOUS | Status: AC
  Administered 2019-05-03: 10 mL via INTRAVENOUS

## 2019-05-03 MED ORDER — HEPARIN SOD (PORK) LOCK FLUSH 100 UNIT/ML IV SOLN
500.0000 [IU] | Freq: Once | INTRAVENOUS | Status: AC
  Administered 2019-05-03: 500 [IU] via INTRAVENOUS

## 2019-05-03 NOTE — Progress Notes (Signed)
[place], [place] [address]   CLINIC:  Medical Oncology/Hematology  PCP:  Fayrene Helper, MD [address], [address] [place] [address] [phone]   REASON FOR VISIT:  Follow-up for colon cancer   BRIEF ONCOLOGIC HISTORY:    Colon cancer ([place])   01/09/2019 Initial Diagnosis    Colon cancer ([place])    03/01/2019 -  Chemotherapy    The patient had palonosetron (ALOXI) injection 0.25 mg, 0.25 mg, Intravenous,  Once, 5 of 12 cycles Administration: 0.25 mg (03/01/2019), 0.25 mg (03/15/2019), 0.25 mg (03/27/2019), 0.25 mg (04/10/2019) pegfilgrastim (NEULASTA) injection 6 mg, 6 mg, Subcutaneous, Once, 1 of 8 cycles leucovorin 600 mg in dextrose 5 % 250 mL infusion, 640 mg, Intravenous,  Once, 5 of 12 cycles Administration: 600 mg (03/01/2019), 600 mg (03/15/2019), 640 mg (03/27/2019), 640 mg (04/10/2019) oxaliplatin (ELOXATIN) 135 mg in dextrose 5 % 500 mL chemo infusion, 85 mg/m2 = 135 mg, Intravenous,  Once, 5 of 12 cycles Dose modification: 68 mg/m2 (80 % of original dose 85 mg/m2, Cycle 5, Reason: Provider Judgment) Administration: 135 mg (03/01/2019), 135 mg (03/15/2019), 135 mg (03/27/2019), 135 mg (04/10/2019) fluorouracil (ADRUCIL) chemo injection 650 mg, 400 mg/m2 = 650 mg, Intravenous,  Once, 5 of 12 cycles Administration: 650 mg (03/01/2019), 650 mg (03/15/2019), 650 mg (03/27/2019), 650 mg (04/10/2019) fluorouracil (ADRUCIL) 3,850 mg in sodium chloride 0.9 % 73 mL chemo infusion, 2,400 mg/m2 = 3,850 mg, Intravenous, 1 Day/Dose, 5 of 12 cycles Administration: 3,850 mg (03/01/2019), 3,850 mg (03/15/2019), 3,850 mg (03/27/2019), 3,850 mg (04/10/2019)  for chemotherapy treatment.       CANCER STAGING: Cancer Staging No matching staging information was found for the patient.   INTERVAL HISTORY:  Ms. Gossen 63 y.o. female returns for routine follow-up and consideration for next cycle of chemotherapy. She is here today alone. She states that she has  felt pretty good since her last visit. Denies any nausea, vomiting, or diarrhea. Denies any new pains. Had not noticed any recent bleeding such as epistaxis, hematuria or hematochezia. Denies recent chest pain on exertion, shortness of breath on minimal exertion, pre-syncopal episodes, or palpitations. Denies any numbness or tingling in hands or feet. Denies any recent fevers, infections, or recent hospitalizations. Patient reports appetite at 75% and energy level at 75%.      REVIEW OF SYSTEMS:  Review of Systems  All other systems reviewed and are negative.    PAST MEDICAL/SURGICAL HISTORY:  Past Medical History:  Diagnosis Date  . Hyperlipidemia   . Multinodular goiter   . Prediabetes    Past Surgical History:  Procedure Laterality Date  . BIOPSY  12/07/2018   Procedure: BIOPSY;  Surgeon: Rogene Houston, MD;  [facility]: [facility];  Service: Endoscopy;;  sigmoid colon  . COLON RESECTION N/A 01/09/2019   Procedure: LAPAROSCOPIC RIGHT HEMICOLECTOMY;  Surgeon: Virl Cagey, MD;  Location: [facility];  Service: General;  Laterality: N/A;  . COLONOSCOPY N/A 12/07/2018   Procedure: COLONOSCOPY;  Surgeon: Rogene Houston, MD;  Location: [facility];  Service: Endoscopy;  Laterality: N/A;  12:45  . DILATION AND CURETTAGE OF UTERUS  2006  . FNA thyroid  2011 and 2019   benign  . POLYPECTOMY  12/07/2018   Procedure: POLYPECTOMY;  Surgeon: Rogene Houston, MD;  Location: [facility];  Service: Endoscopy;;  splenic flexure (CS x1)  . PORTACATH PLACEMENT Left 02/15/2019   Procedure: INSERTION PORT-A-CATH;  Surgeon: Constance Haw,  Lanell Matar, MD;  Location: [facility];  Service: General;  Laterality: Left;  . TUBAL LIGATION  1992     SOCIAL HISTORY:  Social History   Socioeconomic History  . Marital status: Married    Spouse name: Not on file  . Number of children: 1  . Years of education: Not on file  . Highest education level: Not on file  Occupational History  . Occupation:  [employer]  . Financial resource strain: Not on file  . Food insecurity:    Worry: Not on file    Inability: Not on file  . Transportation needs:    Medical: Not on file    Non-medical: Not on file  Tobacco Use  . Smoking status: Current Every Day Smoker    Packs/day: 0.75    Years: 45.00    Pack years: 33.75    Types: Cigarettes  . Smokeless tobacco: Never Used  Substance and Sexual Activity  . Alcohol use: Yes    Alcohol/week: 0.0 standard drinks    Comment: occasionally  . Drug use: No  . Sexual activity: Yes  Lifestyle  . Physical activity:    Days per week: Not on file    Minutes per session: Not on file  . Stress: Not on file  Relationships  . Social connections:    Talks on phone: Not on file    Gets together: Not on file    Attends religious service: Not on file    Active member of club or organization: Not on file    Attends meetings of clubs or organizations: Not on file    Relationship status: Not on file  . Intimate partner violence:    Fear of current or ex partner: Not on file    Emotionally abused: Not on file    Physically abused: Not on file    Forced sexual activity: Not on file  Other Topics Concern  . Not on file  Social History Narrative  . Not on file    FAMILY HISTORY:  Family History  Problem Relation Age of Onset  . Hypertension Mother        AAA  . Coronary artery disease Mother   . Hyperlipidemia Mother   . Cancer Mother        lung  . Hypertension Father   . Cancer Brother     CURRENT MEDICATIONS:  Outpatient Encounter Medications as of 05/03/2019  Medication Sig Note  . acetaminophen (TYLENOL) 500 MG tablet Take 1,000 mg by mouth every 6 (six) hours as needed for moderate pain.    Marland Kitchen ALPRAZolam (XANAX) 0.25 MG tablet Take one tablet at bedtime, as needed , for anxiety (Patient not taking: Reported on 05/03/2019)   . aspirin EC 81 MG tablet Take 1 tablet (81 mg total) by mouth every evening.   .  Calcium Carbonate-Vitamin D (CALCIUM 600+D) 600-400 MG-UNIT tablet Take 1 tablet by mouth 2 (two) times a week. Sunday and Wednesday evenings   . docusate sodium (COLACE) 100 MG capsule Take 1 capsule (100 mg total) by mouth 2 (two) times daily.   . fluorouracil CALGB 64680 in sodium chloride 0.9 % 150 mL Inject into the vein 2 days. Every 14 days 02/02/2019: Has not started yet  . LEUCOVORIN CALCIUM IV Inject into the vein every 14 (fourteen) days. 02/02/2019: Has not started yet  . lidocaine-prilocaine (EMLA) cream Apply to port a cath site one hour prior to chemotherapy appointment to  numb the skin over your port site. 02/02/2019: Has not started yet  . ondansetron (ZOFRAN-ODT) 4 MG disintegrating tablet Take 1 tablet (4 mg total) by mouth every 6 (six) hours as needed for nausea.   . OXALIPLATIN IV Inject into the vein every 14 (fourteen) days. 02/02/2019: Has not started yet  . pantoprazole (PROTONIX) 40 MG tablet Take 1 tablet (40 mg total) by mouth daily.   . pravastatin (PRAVACHOL) 40 MG tablet TAKE 1 TABLET BY MOUTH  DAILY IN THE EVENING (Patient taking differently: Take 40 mg by mouth 4 (four) times a week. )   . prochlorperazine (COMPAZINE) 10 MG tablet Take 1 tablet (10 mg total) by mouth every 6 (six) hours as needed (Nausea or vomiting). 02/02/2019: Has not picked up yet   Facility-Administered Encounter Medications as of 05/03/2019  Medication  . sodium chloride flush (NS) 0.9 % injection 10 mL    ALLERGIES:  No Known Allergies   PHYSICAL EXAM:  ECOG Performance status: 1  Vitals:   05/03/19 0807  BP: 116/77  Pulse: 75  Resp: 16  Temp: 98.2 F (36.8 C)   Filed Weights   05/03/19 0807  Weight: 125 lb 9.6 oz (57 kg)    Physical Exam Vitals signs reviewed.  Constitutional:      Appearance: Normal appearance.  Cardiovascular:     Rate and Rhythm: Normal rate and regular rhythm.     Heart sounds: Normal heart sounds.  Pulmonary:     Effort: Pulmonary effort is normal.      Breath sounds: Normal breath sounds.  Abdominal:     General: There is no distension.     Palpations: Abdomen is soft. There is no mass.  Musculoskeletal:        General: No swelling.  Skin:    General: Skin is warm.  Neurological:     General: No focal deficit present.     Mental Status: She is alert and oriented to person, place, and time.  Psychiatric:        Mood and Affect: Mood normal.        Behavior: Behavior normal.      LABORATORY DATA:  I have reviewed the labs as listed.  CBC    Component Value Date/Time   WBC 3.3 (L) 05/03/2019 0800   RBC 3.79 (L) 05/03/2019 0800   HGB 12.0 05/03/2019 0800   HGB 13.5 08/18/2018 0758   HCT 36.8 05/03/2019 0800   HCT 37.2 08/18/2018 0758   PLT 167 05/03/2019 0800   PLT 259 08/18/2018 0758   MCV 97.1 05/03/2019 0800   MCV 104 (H) 08/18/2018 0758   MCH 31.7 05/03/2019 0800   MCHC 32.6 05/03/2019 0800   RDW 15.5 05/03/2019 0800   RDW 13.0 08/18/2018 0758   LYMPHSABS 1.9 05/03/2019 0800   LYMPHSABS 3.5 (H) 08/31/2017 0955   MONOABS 0.4 05/03/2019 0800   EOSABS 0.0 05/03/2019 0800   EOSABS 0.1 08/31/2017 0955   BASOSABS 0.0 05/03/2019 0800   BASOSABS 0.0 08/31/2017 0955   CMP Latest Ref Rng & Units 05/03/2019 04/26/2019 04/10/2019  Glucose 70 - 99 mg/dL 99 122(H) 113(H)  BUN 8 - 23 mg/dL 10 7(L) 15  Creatinine 0.44 - 1.00 mg/dL 0.65 0.72 0.72  Sodium 135 - 145 mmol/L 140 140 138  Potassium 3.5 - 5.1 mmol/L 3.6 3.6 3.5  Chloride 98 - 111 mmol/L 105 104 105  CO2 22 - 32 mmol/L '25 24 24  ' Calcium 8.9 - 10.3 mg/dL 9.7 9.5  9.3  Total Protein 6.5 - 8.1 g/dL 7.2 7.4 7.2  Total Bilirubin 0.3 - 1.2 mg/dL 0.8 1.1 0.8  Alkaline Phos 38 - 126 U/L 72 75 75  AST 15 - 41 U/L 33 36 25  ALT 0 - 44 U/L '25 25 17       ' DIAGNOSTIC IMAGING:  I have independently reviewed the scans and discussed with the patient.   I have reviewed Venita Lick LPN's note and agree with the documentation.  I personally performed a face-to-face visit,  made revisions and my assessment and plan is as follows.    ASSESSMENT & PLAN:   Malignant neoplasm of transverse colon ([place]) 1.  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  Biopsy was consistent with invasive poorly differentiated adenocarcinoma.  Small polyp was found in the proximal sigmoid colon which was sessile.  External hemorrhoids were present. -CT of the abdomen and pelvis on 12/16/2018 showing a 4.2 x 2.9 cm near circumferential apple core type mass involving the proximal transverse colon with a few small lymph nodes noted in the pericolonic fat.  No retroperitoneal adenopathy or evidence of liver mets. -Laparoscopic right colectomy on 01/09/2019, pathology showing 4 cm moderately differentiated invasive adenocarcinoma, grade 2, 1/25 lymph nodes positive, no tumor deposits, no perineural invasion.  Tumor extends through serosa into adherent omentum.  Margins are negative.  - MMR protein IHC shows loss of nuclear expression of MLH1.  MSI was high by PCR.  BRAF testing was negative. - PET CT scan on 01/31/2019 shows focal hypermetabolic activity identified at the ileocolic anastomosis with SUV max of 9.  This could be related to postsurgical changes, but follow-up as needed.  Low-level FDG uptake in the midline scar likely related to healing. -CEA level on 01/18/2019 was mildly elevated at 5.1. - MSI-high profile by PCR.  BRAF mutation was negative.  MLH1 hyper methylation was present.  Even though there have been a few reported cases of germline hyper methylation of the CPG islands on the MLH1 promoter region, the presence of such hyper methylation has been documented in approximately 20% of sporadic colorectal cancers, which correlated with loss of MLH1 expression and high MSI.  Thus, tumors exhibiting IHC loss of MLH1 and the presence of MLH1 promoter  methylation are presumed more likely to be a sporadic cancer. -4 cycles of adjuvant FOLFOX from 03/01/2019 through 04/10/2019.  - Cycle 5 was held on 04/26/2019 secondary to neutropenia and thrombocytopenia. -She experienced difficulty moving her hands and fingers and dysarthria that occurred for several days after treatment and then resolved.  Most likely due to oxaliplatin.  We have also recommended discontinuing Compazine.  She will use Zofran instead. -We plan to increase oxaliplatin infusion time to 3 to 4 hours. -Today CBC showed ANC of 1100.  Platelet count improved.  Unfortunately she cannot receive her cycle 5 today. -We will reevaluate her blood counts next Tuesday.  If she needs Neupogen we will give her on Tuesday, so that she can get her chemotherapy on Wednesday.  I plan to add Neulasta to the regimen with each cycle starting next week.  We will obtain insurance prior authorization. -I discussed this plan with the patient.  She is agreeable.  2.  Macrocytosis: -She had macrocytosis prior to start of therapy.  Work-up was negative. -This has improved over time.    Total time spent is 25 minutes with  more than 60% of the time spent face-to-face discussing change in plan, addition of new medication and coordination of care.  Orders placed this encounter:  No orders of the defined types were placed in this encounter.     Derek Jack, MD [place] [phone]

## 2019-05-03 NOTE — Progress Notes (Signed)
No treatment today per MD. [place] too low. Vitals stable and discharged home from clinic ambulatory. Follow up as scheduled.

## 2019-05-03 NOTE — Telephone Encounter (Signed)
ENROLLED PT INTO [facility] FIRSTSTEP FOR NEULASTA INJ.

## 2019-05-03 NOTE — Assessment & Plan Note (Addendum)
1.  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  Biopsy was consistent with invasive poorly differentiated adenocarcinoma.  Small polyp was found in the proximal sigmoid colon which was sessile.  External hemorrhoids were present. -CT of the abdomen and pelvis on 12/16/2018 showing a 4.2 x 2.9 cm near circumferential apple core type mass involving the proximal transverse colon with a few small lymph nodes noted in the pericolonic fat.  No retroperitoneal adenopathy or evidence of liver mets. -Laparoscopic right colectomy on 01/09/2019, pathology showing 4 cm moderately differentiated invasive adenocarcinoma, grade 2, 1/25 lymph nodes positive, no tumor deposits, no perineural invasion.  Tumor extends through serosa into adherent omentum.  Margins are negative.  - MMR protein IHC shows loss of nuclear expression of MLH1.  MSI was high by PCR.  BRAF testing was negative. - PET CT scan on 01/31/2019 shows focal hypermetabolic activity identified at the ileocolic anastomosis with SUV max of 9.  This could be related to postsurgical changes, but follow-up as needed.  Low-level FDG uptake in the midline scar likely related to healing. -CEA level on 01/18/2019 was mildly elevated at 5.1. - MSI-high profile by PCR.  BRAF mutation was negative.  MLH1 hyper methylation was present.  Even though there have been a few reported cases of germline hyper methylation of the CPG islands on the MLH1 promoter region, the presence of such hyper methylation has been documented in approximately 20% of sporadic colorectal cancers, which correlated with loss of MLH1 expression and high MSI.  Thus, tumors exhibiting IHC loss of MLH1 and the presence of MLH1 promoter methylation are presumed more likely to be a sporadic cancer. -4 cycles of adjuvant FOLFOX from 03/01/2019 through 04/10/2019.  - Cycle 5 was  held on 04/26/2019 secondary to neutropenia and thrombocytopenia. -She experienced difficulty moving her hands and fingers and dysarthria that occurred for several days after treatment and then resolved.  Most likely due to oxaliplatin.  We have also recommended discontinuing Compazine.  She will use Zofran instead. -We plan to increase oxaliplatin infusion time to 3 to 4 hours. -Today CBC showed ANC of 1100.  Platelet count improved.  Unfortunately she cannot receive her cycle 5 today. -We will reevaluate her blood counts next Tuesday.  If she needs Neupogen we will give her on Tuesday, so that she can get her chemotherapy on Wednesday.  I plan to add Neulasta to the regimen with each cycle starting next week.  We will obtain insurance prior authorization. -I discussed this plan with the patient.  She is agreeable.  2.  Macrocytosis: -She had macrocytosis prior to start of therapy.  Work-up was negative. -This has improved over time.

## 2019-05-03 NOTE — Patient Instructions (Addendum)
[facility] at [facility] Discharge Instructions  You were seen today by Dr. Katragadda. He went over your recent lab results. He will hold your treatment today.  He will see you back in 1 week for labs and follow up.   Thank you for choosing [facility] at [facility] to provide your oncology and hematology care.  To afford each patient quality time with our provider, please arrive at least 15 minutes before your scheduled appointment time.   If you have a lab appointment with the Cancer Center please come in thru the  Main Entrance and check in at the main information desk  You need to re-schedule your appointment should you arrive 10 or more minutes late.  We strive to give you quality time with our providers, and arriving late affects you and other patients whose appointments are after yours.  Also, if you no show three or more times for appointments you may be dismissed from the clinic at the providers discretion.     Again, thank you for choosing [facility].  Our hope is that these requests will decrease the amount of time that you wait before being seen by our physicians.       _____________________________________________________________  Should you have questions after your visit to [facility], please contact our office at [phone] between the hours of 8:00 a.m. and 4:30 p.m.  Voicemails left after 4:00 p.m. will not be returned until the following business day.  For prescription refill requests, have your pharmacy contact our office and allow 72 hours.    Cancer Center Support Programs:   > Cancer Support Group  2nd Tuesday of the month 1pm-2pm, Journey Room    

## 2019-05-05 ENCOUNTER — Encounter (HOSPITAL_COMMUNITY): Payer: 59

## 2019-05-08 ENCOUNTER — Inpatient Hospital Stay (HOSPITAL_COMMUNITY): Payer: 59

## 2019-05-08 ENCOUNTER — Other Ambulatory Visit: Payer: Self-pay

## 2019-05-08 DIAGNOSIS — C184 Malignant neoplasm of transverse colon: Secondary | ICD-10-CM

## 2019-05-08 DIAGNOSIS — Z5189 Encounter for other specified aftercare: Secondary | ICD-10-CM | POA: Diagnosis not present

## 2019-05-08 LAB — CBC WITH DIFFERENTIAL/PLATELET
Abs Immature Granulocytes: 0.02 10*3/uL (ref 0.00–0.07)
Basophils Absolute: 0 10*3/uL (ref 0.0–0.1)
Basophils Relative: 1 %
Eosinophils Absolute: 0 10*3/uL (ref 0.0–0.5)
Eosinophils Relative: 0 %
HCT: 37.4 % (ref 36.0–46.0)
Hemoglobin: 12.2 g/dL (ref 12.0–15.0)
Immature Granulocytes: 0 %
Lymphocytes Relative: 38 %
Lymphs Abs: 1.8 10*3/uL (ref 0.7–4.0)
MCH: 31.9 pg (ref 26.0–34.0)
MCHC: 32.6 g/dL (ref 30.0–36.0)
MCV: 97.7 fL (ref 80.0–100.0)
Monocytes Absolute: 0.4 10*3/uL (ref 0.1–1.0)
Monocytes Relative: 8 %
Neutro Abs: 2.6 10*3/uL (ref 1.7–7.7)
Neutrophils Relative %: 53 %
Platelets: 130 10*3/uL — ABNORMAL LOW (ref 150–400)
RBC: 3.83 MIL/uL — ABNORMAL LOW (ref 3.87–5.11)
RDW: 15.8 % — ABNORMAL HIGH (ref 11.5–15.5)
WBC: 4.8 10*3/uL (ref 4.0–10.5)
nRBC: 0 % (ref 0.0–0.2)

## 2019-05-08 LAB — COMPREHENSIVE METABOLIC PANEL
ALT: 22 U/L (ref 0–44)
AST: 25 U/L (ref 15–41)
Albumin: 4.3 g/dL (ref 3.5–5.0)
Alkaline Phosphatase: 84 U/L (ref 38–126)
Anion gap: 9 (ref 5–15)
BUN: 9 mg/dL (ref 8–23)
CO2: 27 mmol/L (ref 22–32)
Calcium: 10 mg/dL (ref 8.9–10.3)
Chloride: 103 mmol/L (ref 98–111)
Creatinine, Ser: 0.66 mg/dL (ref 0.44–1.00)
GFR calc Af Amer: >60 mL/min (ref >60)
GFR calc non Af Amer: >60 mL/min (ref >60)
Glucose, Bld: 104 mg/dL — ABNORMAL HIGH (ref 70–99)
Potassium: 3.6 mmol/L (ref 3.5–5.1)
Sodium: 139 mmol/L (ref 135–145)
Total Bilirubin: 0.8 mg/dL (ref 0.3–1.2)
Total Protein: 7.5 g/dL (ref 6.5–8.1)

## 2019-05-09 ENCOUNTER — Inpatient Hospital Stay (HOSPITAL_COMMUNITY): Payer: 59

## 2019-05-09 ENCOUNTER — Encounter (HOSPITAL_COMMUNITY): Payer: Self-pay | Admitting: Hematology

## 2019-05-09 ENCOUNTER — Encounter (HOSPITAL_COMMUNITY): Payer: Self-pay

## 2019-05-09 ENCOUNTER — Inpatient Hospital Stay (HOSPITAL_BASED_OUTPATIENT_CLINIC_OR_DEPARTMENT_OTHER): Payer: 59 | Admitting: Hematology

## 2019-05-09 VITALS — BP 115/85 | HR 71 | Temp 98.5°F | Resp 18

## 2019-05-09 DIAGNOSIS — D696 Thrombocytopenia, unspecified: Secondary | ICD-10-CM

## 2019-05-09 DIAGNOSIS — E785 Hyperlipidemia, unspecified: Secondary | ICD-10-CM

## 2019-05-09 DIAGNOSIS — C184 Malignant neoplasm of transverse colon: Secondary | ICD-10-CM

## 2019-05-09 DIAGNOSIS — K5900 Constipation, unspecified: Secondary | ICD-10-CM

## 2019-05-09 DIAGNOSIS — F419 Anxiety disorder, unspecified: Secondary | ICD-10-CM

## 2019-05-09 DIAGNOSIS — R002 Palpitations: Secondary | ICD-10-CM

## 2019-05-09 DIAGNOSIS — F17210 Nicotine dependence, cigarettes, uncomplicated: Secondary | ICD-10-CM

## 2019-05-09 DIAGNOSIS — R471 Dysarthria and anarthria: Secondary | ICD-10-CM | POA: Diagnosis not present

## 2019-05-09 DIAGNOSIS — R5383 Other fatigue: Secondary | ICD-10-CM

## 2019-05-09 DIAGNOSIS — D701 Agranulocytosis secondary to cancer chemotherapy: Secondary | ICD-10-CM | POA: Diagnosis not present

## 2019-05-09 DIAGNOSIS — G479 Sleep disorder, unspecified: Secondary | ICD-10-CM

## 2019-05-09 DIAGNOSIS — R42 Dizziness and giddiness: Secondary | ICD-10-CM

## 2019-05-09 DIAGNOSIS — D7589 Other specified diseases of blood and blood-forming organs: Secondary | ICD-10-CM

## 2019-05-09 DIAGNOSIS — Z79899 Other long term (current) drug therapy: Secondary | ICD-10-CM

## 2019-05-09 DIAGNOSIS — Z8249 Family history of ischemic heart disease and other diseases of the circulatory system: Secondary | ICD-10-CM

## 2019-05-09 DIAGNOSIS — Z5189 Encounter for other specified aftercare: Secondary | ICD-10-CM | POA: Diagnosis not present

## 2019-05-09 DIAGNOSIS — Z7982 Long term (current) use of aspirin: Secondary | ICD-10-CM

## 2019-05-09 DIAGNOSIS — G620 Drug-induced polyneuropathy: Secondary | ICD-10-CM

## 2019-05-09 MED ORDER — OXALIPLATIN CHEMO INJECTION 100 MG/20ML
68.0000 mg/m2 | Freq: Once | INTRAVENOUS | Status: AC
  Administered 2019-05-09: 110 mg via INTRAVENOUS
  Filled 2019-05-09: qty 2

## 2019-05-09 MED ORDER — FLUOROURACIL CHEMO INJECTION 2.5 GM/50ML
400.0000 mg/m2 | Freq: Once | INTRAVENOUS | Status: AC
  Administered 2019-05-09: 16:00:00 650 mg via INTRAVENOUS
  Filled 2019-05-09: qty 13

## 2019-05-09 MED ORDER — PALONOSETRON HCL INJECTION 0.25 MG/5ML
INTRAVENOUS | Status: AC
  Filled 2019-05-09: qty 5

## 2019-05-09 MED ORDER — DEXTROSE 5 % IV SOLN
Freq: Once | INTRAVENOUS | Status: AC
  Administered 2019-05-09: 10:00:00 via INTRAVENOUS

## 2019-05-09 MED ORDER — LEUCOVORIN CALCIUM INJECTION 350 MG
400.0000 mg/m2 | Freq: Once | INTRAVENOUS | Status: AC
  Administered 2019-05-09: 640 mg via INTRAVENOUS
  Filled 2019-05-09: qty 32

## 2019-05-09 MED ORDER — SODIUM CHLORIDE 0.9 % IV SOLN
2400.0000 mg/m2 | INTRAVENOUS | Status: DC
  Administered 2019-05-09: 3850 mg via INTRAVENOUS
  Filled 2019-05-09: qty 77

## 2019-05-09 MED ORDER — SODIUM CHLORIDE 0.9 % IV SOLN
10.0000 mg | Freq: Once | INTRAVENOUS | Status: AC
  Administered 2019-05-09: 10 mg via INTRAVENOUS
  Filled 2019-05-09: qty 10

## 2019-05-09 MED ORDER — PALONOSETRON HCL INJECTION 0.25 MG/5ML
0.2500 mg | Freq: Once | INTRAVENOUS | Status: AC
  Administered 2019-05-09: 0.25 mg via INTRAVENOUS

## 2019-05-09 NOTE — Progress Notes (Signed)
[place], [place] [address]   CLINIC:  Medical Oncology/Hematology  PCP:  Fayrene Helper, MD [address] [place] [address] [phone]   REASON FOR VISIT:  Follow-up for colon cancer   BRIEF ONCOLOGIC HISTORY:    Colon cancer ([place])   01/09/2019 Initial Diagnosis    Colon cancer ([place])    03/01/2019 -  Chemotherapy    The patient had palonosetron (ALOXI) injection 0.25 mg, 0.25 mg, Intravenous,  Once, 5 of 12 cycles Administration: 0.25 mg (03/01/2019), 0.25 mg (03/15/2019), 0.25 mg (03/27/2019), 0.25 mg (04/10/2019) pegfilgrastim (NEULASTA) injection 6 mg, 6 mg, Subcutaneous, Once, 1 of 8 cycles leucovorin 600 mg in dextrose 5 % 250 mL infusion, 640 mg, Intravenous,  Once, 5 of 12 cycles Administration: 600 mg (03/01/2019), 600 mg (03/15/2019), 640 mg (03/27/2019), 640 mg (04/10/2019) oxaliplatin (ELOXATIN) 135 mg in dextrose 5 % 500 mL chemo infusion, 85 mg/m2 = 135 mg, Intravenous,  Once, 5 of 12 cycles Dose modification: 68 mg/m2 (80 % of original dose 85 mg/m2, Cycle 5, Reason: Provider Judgment) Administration: 135 mg (03/01/2019), 135 mg (03/15/2019), 135 mg (03/27/2019), 135 mg (04/10/2019) fluorouracil (ADRUCIL) chemo injection 650 mg, 400 mg/m2 = 650 mg, Intravenous,  Once, 5 of 12 cycles Administration: 650 mg (03/01/2019), 650 mg (03/15/2019), 650 mg (03/27/2019), 650 mg (04/10/2019) fluorouracil (ADRUCIL) 3,850 mg in sodium chloride 0.9 % 73 mL chemo infusion, 2,400 mg/m2 = 3,850 mg, Intravenous, 1 Day/Dose, 5 of 12 cycles Administration: 3,850 mg (03/01/2019), 3,850 mg (03/15/2019), 3,850 mg (03/27/2019), 3,850 mg (04/10/2019)  for chemotherapy treatment.       CANCER STAGING: Cancer Staging No matching staging information was found for the patient.   INTERVAL HISTORY:  Andrea Simon 63 y.o. female returns for routine follow-up and consideration for next cycle of chemotherapy. She is here today alone. She states that she has  felt pretty good since her last visit. Denies any nausea, vomiting, or diarrhea. Denies any new pains. Had not noticed any recent bleeding such as epistaxis, hematuria or hematochezia. Denies recent chest pain on exertion, shortness of breath on minimal exertion, pre-syncopal episodes, or palpitations. Denies any numbness or tingling in hands or feet. Denies any recent fevers, infections, or recent hospitalizations. Patient reports appetite at 75% and energy level at 75%.      REVIEW OF SYSTEMS:  Review of Systems  All other systems reviewed and are negative.    PAST MEDICAL/SURGICAL HISTORY:  Past Medical History:  Diagnosis Date  . Hyperlipidemia   . Multinodular goiter   . Prediabetes    Past Surgical History:  Procedure Laterality Date  . BIOPSY  12/07/2018   Procedure: BIOPSY;  Surgeon: Rogene Houston, MD;  [facility];  Service: Endoscopy;;  sigmoid colon  . COLON RESECTION N/A 01/09/2019   Procedure: LAPAROSCOPIC RIGHT HEMICOLECTOMY;  Surgeon: Virl Cagey, MD;  Location: [facility];  Service: General;  Laterality: N/A;  . COLONOSCOPY N/A 12/07/2018   Procedure: COLONOSCOPY;  Surgeon: Rogene Houston, MD;  Location: [facility];  Service: Endoscopy;  Laterality: N/A;  12:45  . DILATION AND CURETTAGE OF UTERUS  2006  . FNA thyroid  2011 and 2019   benign  . POLYPECTOMY  12/07/2018   Procedure: POLYPECTOMY;  Surgeon: Rogene Houston, MD;  Location: [facility];  Service: Endoscopy;;  splenic flexure (CS x1)  . PORTACATH PLACEMENT Left 02/15/2019   Procedure: INSERTION PORT-A-CATH;  Surgeon: Constance Haw,  Lanell Matar, MD;  Location: [facility];  Service: General;  Laterality: Left;  . TUBAL LIGATION  1992     SOCIAL HISTORY:  Social History   Socioeconomic History  . Marital status: Married    Spouse name: Not on file  . Number of children: 1  . Years of education: Not on file  . Highest education level: Not on file  Occupational History  . Occupation:  [employer]  . Financial resource strain: Not on file  . Food insecurity:    Worry: Not on file    Inability: Not on file  . Transportation needs:    Medical: Not on file    Non-medical: Not on file  Tobacco Use  . Smoking status: Current Every Day Smoker    Packs/day: 0.75    Years: 45.00    Pack years: 33.75    Types: Cigarettes  . Smokeless tobacco: Never Used  Substance and Sexual Activity  . Alcohol use: Yes    Alcohol/week: 0.0 standard drinks    Comment: occasionally  . Drug use: No  . Sexual activity: Yes  Lifestyle  . Physical activity:    Days per week: Not on file    Minutes per session: Not on file  . Stress: Not on file  Relationships  . Social connections:    Talks on phone: Not on file    Gets together: Not on file    Attends religious service: Not on file    Active member of club or organization: Not on file    Attends meetings of clubs or organizations: Not on file    Relationship status: Not on file  . Intimate partner violence:    Fear of current or ex partner: Not on file    Emotionally abused: Not on file    Physically abused: Not on file    Forced sexual activity: Not on file  Other Topics Concern  . Not on file  Social History Narrative  . Not on file    FAMILY HISTORY:  Family History  Problem Relation Age of Onset  . Hypertension Mother        AAA  . Coronary artery disease Mother   . Hyperlipidemia Mother   . Cancer Mother        lung  . Hypertension Father   . Cancer Brother     CURRENT MEDICATIONS:  Outpatient Encounter Medications as of 05/09/2019  Medication Sig Note  . acetaminophen (TYLENOL) 500 MG tablet Take 1,000 mg by mouth every 6 (six) hours as needed for moderate pain.    Marland Kitchen ALPRAZolam (XANAX) 0.25 MG tablet Take one tablet at bedtime, as needed , for anxiety   . aspirin EC 81 MG tablet Take 1 tablet (81 mg total) by mouth every evening.   . Calcium Carbonate-Vitamin D (CALCIUM 600+D)  600-400 MG-UNIT tablet Take 1 tablet by mouth 2 (two) times a week. Sunday and Wednesday evenings   . docusate sodium (COLACE) 100 MG capsule Take 1 capsule (100 mg total) by mouth 2 (two) times daily.   . fluorouracil CALGB 48250 in sodium chloride 0.9 % 150 mL Inject into the vein 2 days. Every 14 days 02/02/2019: Has not started yet  . LEUCOVORIN CALCIUM IV Inject into the vein every 14 (fourteen) days. 02/02/2019: Has not started yet  . lidocaine-prilocaine (EMLA) cream Apply to port a cath site one hour prior to chemotherapy appointment to numb the skin over your port  site. 02/02/2019: Has not started yet  . ondansetron (ZOFRAN-ODT) 4 MG disintegrating tablet Take 1 tablet (4 mg total) by mouth every 6 (six) hours as needed for nausea.   . OXALIPLATIN IV Inject into the vein every 14 (fourteen) days. 02/02/2019: Has not started yet  . pravastatin (PRAVACHOL) 40 MG tablet TAKE 1 TABLET BY MOUTH  DAILY IN THE EVENING (Patient taking differently: Take 40 mg by mouth 4 (four) times a week. )   . prochlorperazine (COMPAZINE) 10 MG tablet Take 1 tablet (10 mg total) by mouth every 6 (six) hours as needed (Nausea or vomiting). 02/02/2019: Has not picked up yet  . pantoprazole (PROTONIX) 40 MG tablet Take 1 tablet (40 mg total) by mouth daily. (Patient not taking: Reported on 05/09/2019)    Facility-Administered Encounter Medications as of 05/09/2019  Medication  . sodium chloride flush (NS) 0.9 % injection 10 mL    ALLERGIES:  No Known Allergies   PHYSICAL EXAM:  ECOG Performance status: 1  Vitals:   05/09/19 0841  BP: (!) 147/75  Pulse: 77  Resp: 18  Temp: 98.5 F (36.9 C)  SpO2: 99%   Filed Weights   05/09/19 0841  Weight: 127 lb 3.2 oz (57.7 kg)    Physical Exam Vitals signs reviewed.  Constitutional:      Appearance: Normal appearance.  Cardiovascular:     Rate and Rhythm: Normal rate and regular rhythm.     Heart sounds: Normal heart sounds.  Pulmonary:     Effort: Pulmonary  effort is normal.     Breath sounds: Normal breath sounds.  Abdominal:     General: There is no distension.     Palpations: Abdomen is soft. There is no mass.  Musculoskeletal:        General: No swelling.  Skin:    General: Skin is warm.  Neurological:     General: No focal deficit present.     Mental Status: She is alert and oriented to person, place, and time.  Psychiatric:        Mood and Affect: Mood normal.        Behavior: Behavior normal.      LABORATORY DATA:  I have reviewed the labs as listed.  CBC    Component Value Date/Time   WBC 4.8 05/08/2019 1244   RBC 3.83 (L) 05/08/2019 1244   HGB 12.2 05/08/2019 1244   HGB 13.5 08/18/2018 0758   HCT 37.4 05/08/2019 1244   HCT 37.2 08/18/2018 0758   PLT 130 (L) 05/08/2019 1244   PLT 259 08/18/2018 0758   MCV 97.7 05/08/2019 1244   MCV 104 (H) 08/18/2018 0758   MCH 31.9 05/08/2019 1244   MCHC 32.6 05/08/2019 1244   RDW 15.8 (H) 05/08/2019 1244   RDW 13.0 08/18/2018 0758   LYMPHSABS 1.8 05/08/2019 1244   LYMPHSABS 3.5 (H) 08/31/2017 0955   MONOABS 0.4 05/08/2019 1244   EOSABS 0.0 05/08/2019 1244   EOSABS 0.1 08/31/2017 0955   BASOSABS 0.0 05/08/2019 1244   BASOSABS 0.0 08/31/2017 0955   CMP Latest Ref Rng & Units 05/08/2019 05/03/2019 04/26/2019  Glucose 70 - 99 mg/dL 104(H) 99 122(H)  BUN 8 - 23 mg/dL 9 10 7(L)  Creatinine 0.44 - 1.00 mg/dL 0.66 0.65 0.72  Sodium 135 - 145 mmol/L 139 140 140  Potassium 3.5 - 5.1 mmol/L 3.6 3.6 3.6  Chloride 98 - 111 mmol/L 103 105 104  CO2 22 - 32 mmol/L '27 25 24  ' Calcium 8.9 -  10.3 mg/dL 10.0 9.7 9.5  Total Protein 6.5 - 8.1 g/dL 7.5 7.2 7.4  Total Bilirubin 0.3 - 1.2 mg/dL 0.8 0.8 1.1  Alkaline Phos 38 - 126 U/L 84 72 75  AST 15 - 41 U/L 25 33 36  ALT 0 - 44 U/L '22 25 25       ' DIAGNOSTIC IMAGING:  I have independently reviewed the scans and discussed with the patient.   I have reviewed Venita Lick LPN's note and agree with the documentation.  I personally  performed a face-to-face visit, made revisions and my assessment and plan is as follows.    ASSESSMENT & PLAN:   Malignant neoplasm of transverse colon ([place]) 1.  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  Biopsy was consistent with invasive poorly differentiated adenocarcinoma.  Small polyp was found in the proximal sigmoid colon which was sessile.  External hemorrhoids were present. -CT of the abdomen and pelvis on 12/16/2018 showing a 4.2 x 2.9 cm near circumferential apple core type mass involving the proximal transverse colon with a few small lymph nodes noted in the pericolonic fat.  No retroperitoneal adenopathy or evidence of liver mets. -Laparoscopic right colectomy on 01/09/2019, pathology showing 4 cm moderately differentiated invasive adenocarcinoma, grade 2, 1/25 lymph nodes positive, no tumor deposits, no perineural invasion.  Tumor extends through serosa into adherent omentum.  Margins are negative.  - MMR protein IHC shows loss of nuclear expression of MLH1.  MSI was high by PCR.  BRAF testing was negative. - PET CT scan on 01/31/2019 shows focal hypermetabolic activity identified at the ileocolic anastomosis with SUV max of 9.  This could be related to postsurgical changes, but follow-up as needed.  Low-level FDG uptake in the midline scar likely related to healing. -CEA level on 01/18/2019 was mildly elevated at 5.1. - MSI-high profile by PCR.  BRAF mutation was negative.  MLH1 hyper methylation was present.  Even though there have been a few reported cases of germline hyper methylation of the CPG islands on the MLH1 promoter region, the presence of such hyper methylation has been documented in approximately 20% of sporadic colorectal cancers, which correlated with loss of MLH1 expression and high MSI.  Thus, tumors exhibiting IHC loss of MLH1 and the  presence of MLH1 promoter methylation are presumed more likely to be a sporadic cancer. -4 cycles of adjuvant FOLFOX from 03/01/2019 through 04/10/2019.  - Cycle 5 was held on 04/26/2019 secondary to neutropenia and thrombocytopenia. -She experienced difficulty moving her hands and fingers and dysarthria that occurred for several days after treatment and then resolved.  Most likely due to oxaliplatin.  We have also recommended discontinuing Compazine.  She will use Zofran instead. -We will increase oxaliplatin infusion time to 4 hours. -Today CBC : WBC: 4.8, ANC:2.6. She may proceed with Cycle 5 today. We will also add Neupogen to her regimen to maintain adequate WBC count.  -RTC in 2 weeks.   2.  Macrocytosis: -She had macrocytosis prior to start of therapy.  Work-up was negative. -This has improved over time.    Total time spent is 25 minutes with more than 60% of the time spent face-to-face discussing change in plan, addition of new medication and coordination of care.  Orders placed this encounter:  No orders of the defined types were placed in this encounter.     Derek Jack, MD [facility] [phone]

## 2019-05-09 NOTE — Assessment & Plan Note (Addendum)
1.  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  Biopsy was consistent with invasive poorly differentiated adenocarcinoma.  Small polyp was found in the proximal sigmoid colon which was sessile.  External hemorrhoids were present. -CT of the abdomen and pelvis on 12/16/2018 showing a 4.2 x 2.9 cm near circumferential apple core type mass involving the proximal transverse colon with a few small lymph nodes noted in the pericolonic fat.  No retroperitoneal adenopathy or evidence of liver mets. -Laparoscopic right colectomy on 01/09/2019, pathology showing 4 cm moderately differentiated invasive adenocarcinoma, grade 2, 1/25 lymph nodes positive, no tumor deposits, no perineural invasion.  Tumor extends through serosa into adherent omentum.  Margins are negative.  - MMR protein IHC shows loss of nuclear expression of MLH1.  MSI was high by PCR.  BRAF testing was negative. - PET CT scan on 01/31/2019 shows focal hypermetabolic activity identified at the ileocolic anastomosis with SUV max of 9.  This could be related to postsurgical changes, but follow-up as needed.  Low-level FDG uptake in the midline scar likely related to healing. -CEA level on 01/18/2019 was mildly elevated at 5.1. - MSI-high profile by PCR.  BRAF mutation was negative.  MLH1 hyper methylation was present.  Even though there have been a few reported cases of germline hyper methylation of the CPG islands on the MLH1 promoter region, the presence of such hyper methylation has been documented in approximately 20% of sporadic colorectal cancers, which correlated with loss of MLH1 expression and high MSI.  Thus, tumors exhibiting IHC loss of MLH1 and the presence of MLH1 promoter methylation are presumed more likely to be a sporadic cancer. -4 cycles of adjuvant FOLFOX from 03/01/2019 through 04/10/2019.  - Cycle 5 was  held on 04/26/2019 secondary to neutropenia and thrombocytopenia. -She experienced difficulty moving her hands and fingers and dysarthria that occurred for several days after treatment and then resolved.  Most likely due to oxaliplatin.  We have also recommended discontinuing Compazine.  She will use Zofran instead. -We will increase oxaliplatin infusion time to 4 hours. -Today CBC : WBC: 4.8, ANC:2.6. She may proceed with Cycle 5 today. We will also add Neupogen to her regimen to maintain adequate WBC count.  -RTC in 2 weeks.   2.  Macrocytosis: -She had macrocytosis prior to start of therapy.  Work-up was negative. -This has improved over time.

## 2019-05-09 NOTE — Progress Notes (Signed)
Labs reviewed with MD at office visit, proceed with treatment today per MD.   Treatment given per orders. Patient tolerated it well without problems. Vitals stable and discharged home from clinic ambulatory. Follow up as scheduled.

## 2019-05-09 NOTE — Patient Instructions (Signed)
[facility] at [facility] Discharge Instructions  You were seen today by Dr. Katragadda. He went over your recent lab results. He will see you back in 2 weeks for labs and follow up.   Thank you for choosing [facility] at [facility] to provide your oncology and hematology care.  To afford each patient quality time with our provider, please arrive at least 15 minutes before your scheduled appointment time.   If you have a lab appointment with the Cancer Center please come in thru the  Main Entrance and check in at the main information desk  You need to re-schedule your appointment should you arrive 10 or more minutes late.  We strive to give you quality time with our providers, and arriving late affects you and other patients whose appointments are after yours.  Also, if you no show three or more times for appointments you may be dismissed from the clinic at the providers discretion.     Again, thank you for choosing [facility].  Our hope is that these requests will decrease the amount of time that you wait before being seen by our physicians.       _____________________________________________________________  Should you have questions after your visit to [facility], please contact our office at [phone] between the hours of 8:00 a.m. and 4:30 p.m.  Voicemails left after 4:00 p.m. will not be returned until the following business day.  For prescription refill requests, have your pharmacy contact our office and allow 72 hours.    Cancer Center Support Programs:   > Cancer Support Group  2nd Tuesday of the month 1pm-2pm, Journey Room    

## 2019-05-09 NOTE — Patient Instructions (Signed)
[facility] Discharge Instructions for Patients Receiving Chemotherapy  Today you received the following chemotherapy agents   To help prevent nausea and vomiting after your treatment, we encourage you to take your nausea medication   If you develop nausea and vomiting that is not controlled by your nausea medication, call the clinic.   BELOW ARE SYMPTOMS THAT SHOULD BE REPORTED IMMEDIATELY:  *FEVER GREATER THAN 100.5 F  *CHILLS WITH OR WITHOUT FEVER  NAUSEA AND VOMITING THAT IS NOT CONTROLLED WITH YOUR NAUSEA MEDICATION  *UNUSUAL SHORTNESS OF BREATH  *UNUSUAL BRUISING OR BLEEDING  TENDERNESS IN MOUTH AND THROAT WITH OR WITHOUT PRESENCE OF ULCERS  *URINARY PROBLEMS  *BOWEL PROBLEMS  UNUSUAL RASH Items with * indicate a potential emergency and should be followed up as soon as possible.  Feel free to call the clinic should you have any questions or concerns. The clinic phone number is [phone].  Please show the CHEMO ALERT CARD at check-in to the Emergency Department and triage nurse.   

## 2019-05-10 ENCOUNTER — Ambulatory Visit (HOSPITAL_COMMUNITY): Payer: 59

## 2019-05-10 ENCOUNTER — Other Ambulatory Visit (HOSPITAL_COMMUNITY): Payer: 59

## 2019-05-10 ENCOUNTER — Ambulatory Visit (HOSPITAL_COMMUNITY): Payer: 59 | Admitting: Hematology

## 2019-05-11 ENCOUNTER — Encounter (HOSPITAL_COMMUNITY): Payer: Self-pay

## 2019-05-11 ENCOUNTER — Inpatient Hospital Stay (HOSPITAL_COMMUNITY): Payer: 59

## 2019-05-11 ENCOUNTER — Other Ambulatory Visit: Payer: Self-pay

## 2019-05-11 VITALS — BP 140/82 | HR 71 | Temp 98.6°F | Resp 16

## 2019-05-11 DIAGNOSIS — Z5189 Encounter for other specified aftercare: Secondary | ICD-10-CM | POA: Diagnosis not present

## 2019-05-11 DIAGNOSIS — C184 Malignant neoplasm of transverse colon: Secondary | ICD-10-CM

## 2019-05-11 MED ORDER — PEGFILGRASTIM INJECTION 6 MG/0.6ML ~~LOC~~
6.0000 mg | PREFILLED_SYRINGE | Freq: Once | SUBCUTANEOUS | Status: AC
  Administered 2019-05-11: 6 mg via SUBCUTANEOUS

## 2019-05-11 MED ORDER — PEGFILGRASTIM INJECTION 6 MG/0.6ML ~~LOC~~
PREFILLED_SYRINGE | SUBCUTANEOUS | Status: AC
  Filled 2019-05-11: qty 0.6

## 2019-05-11 MED ORDER — SODIUM CHLORIDE 0.9% FLUSH
10.0000 mL | INTRAVENOUS | Status: DC | PRN
  Administered 2019-05-11: 10 mL
  Filled 2019-05-11: qty 10

## 2019-05-11 MED ORDER — HEPARIN SOD (PORK) LOCK FLUSH 100 UNIT/ML IV SOLN
500.0000 [IU] | Freq: Once | INTRAVENOUS | Status: AC | PRN
  Administered 2019-05-11: 500 [IU]

## 2019-05-11 NOTE — Progress Notes (Signed)
Andrea Simon presents today for injection per MD orders. Neulasta 6mg  administered SQ in right Abdomen. Administration without incident. Patient tolerated well.  Andrea Simon returns today for port de access and flush after 46 hr continous infusion of 50fu. Tolerated infusion without problems. Portacath located left chest wall was  deaccessed and flushed with 61ml NS and 500U/34ml Heparin and needle removed intact.  Procedure without incident. Patient tolerated procedure well.  Vitals stable and discharged home from clinic ambulatory. Follow up as scheduled.

## 2019-05-12 ENCOUNTER — Encounter (HOSPITAL_COMMUNITY): Payer: 59

## 2019-05-18 ENCOUNTER — Other Ambulatory Visit: Payer: Self-pay

## 2019-05-18 ENCOUNTER — Ambulatory Visit (INDEPENDENT_AMBULATORY_CARE_PROVIDER_SITE_OTHER): Payer: 59 | Admitting: Family Medicine

## 2019-05-18 VITALS — BP 140/75 | Ht 63.0 in | Wt 127.0 lb

## 2019-05-18 DIAGNOSIS — E782 Mixed hyperlipidemia: Secondary | ICD-10-CM

## 2019-05-18 DIAGNOSIS — F5105 Insomnia due to other mental disorder: Secondary | ICD-10-CM | POA: Diagnosis not present

## 2019-05-18 DIAGNOSIS — Z1239 Encounter for other screening for malignant neoplasm of breast: Secondary | ICD-10-CM | POA: Diagnosis not present

## 2019-05-18 DIAGNOSIS — Z7189 Other specified counseling: Secondary | ICD-10-CM

## 2019-05-18 DIAGNOSIS — C184 Malignant neoplasm of transverse colon: Secondary | ICD-10-CM

## 2019-05-18 DIAGNOSIS — F409 Phobic anxiety disorder, unspecified: Secondary | ICD-10-CM

## 2019-05-18 DIAGNOSIS — E1169 Type 2 diabetes mellitus with other specified complication: Secondary | ICD-10-CM

## 2019-05-18 DIAGNOSIS — Z1321 Encounter for screening for nutritional disorder: Secondary | ICD-10-CM

## 2019-05-18 NOTE — Patient Instructions (Addendum)
Physical exam with MD in October, call if you need  Me before   Lipid , vit d are added to lab that you will have at oncology center [facility] on June 2   Mammogram to be scheduled for mid September, latest appt available  Social distancing.Avoid crowds and maintain a 6 ft at least distance Frequent hand washing with soap and water Keeping your hands off of your face.wear a face mask These 3 practices will help to keep both you and your community healthy during this time. Please practice them faithfully!  Thanks for choosing [facility], we consider it a privelige to serve you.

## 2019-05-18 NOTE — Progress Notes (Signed)
Virtual Visit via Telephone Note  I connected with Mikael Spray on 05/18/19 at  3:20 PM EDT by telephone and verified that I am speaking with the correct person using two identifiers.  Locatiion Patient:home Provider:office   I discussed the limitations, risks, security and privacy concerns of performing an evaluation and management service by telephone and the availability of in person appointments. I also discussed with the patient that there may be a patient responsible charge related to this service. The patient expressed understanding and agreed to proceed. This visit type is conducted due to national recommendations for restrictions regarding the COVID -19 Pandemic. Due to the patient's age and / or co morbidities, this format is felt to be most appropriate at this time without adequate follow up. The patient has no access to video technology/ had technical difficulties with video, requiring transitioning to audio format  only ( telephone ). All issues noted this document were discussed and addressed,no physical exam can be performed in this format.    History of Present Illness: Denies recent fever or chills.c/o weakness , fatigue and poor sleep as sh is being treated with chemo for her colon cancer. Hs started exercise and muscle strengthening on average 2 to 3 times per week, and has managed  to maintain a steady weight following the initial weight loss when she was first diagnosed with colon cancer and had resection.  Started a new treatment this week she was unable to tolerate previous treatment and had severe leukopenia Denies sinus pressure, nasal congestion, ear pain or sore throat. Denies chest congestion, productive cough or wheezing. Denies chest pains, palpitations and leg swelling Denies abdominal pain, nausea, vomiting,diarrhea or constipation.   Denies dysuria, frequency, hesitancy or incontinence. Denies joint pain, swelling and limitation in mobility. Denies  headaches, seizures, numbness, or tingling. Denies uncontrolled  depression, anxiety does have mild insomnia.Describes  Her feeling as " sadness" rather than depression Denies skin break down or rash.       Observations/Objective: BP 140/75   Ht 5\' 3"  (1.6 m)   Wt 127 lb (57.6 kg)   BMI 22.50 kg/m  Good communication with no confusion and intact memory. Alert and oriented x 3 No signs of respiratory distress during sppech    Assessment and Plan:  Hyperlipemia Hyperlipidemia:Low fat diet discussed and encouraged.   Lipid Panel  Lab Results  Component Value Date   CHOL 189 08/18/2018   HDL 32 (L) 08/18/2018   LDLCALC 112 (H) 08/18/2018   TRIG 224 (H) 08/18/2018   CHOLHDL 5.9 (H) 08/18/2018   Updated lab needed at/ before next visit.     Insomnia due to anxiety and fear Improved but still a concern, will continue to ork on sleep and rest without medication, does " nap" during the daytime when able  Diabetes mellitus ([place]) Updated lab needed at/ before next visit. 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 ( follow up with PCP or arrange  E-visit) The importance of social  distancing is discussed today.    Follow Up Instructions:    I discussed the assessment and treatment plan with the patient. The patient was provided an opportunity to ask questions and all were answered. The patient agreed with the plan and demonstrated  an understanding of the instructions.   The patient was advised to call back or seek an in-person evaluation if the symptoms worsen or if the condition fails to improve as anticipated.  I provided 22  minutes of non-face-to-face time during this encounter.   Tula Nakayama, MD

## 2019-05-19 ENCOUNTER — Telehealth: Payer: Self-pay

## 2019-05-19 ENCOUNTER — Encounter: Payer: Self-pay | Admitting: Family Medicine

## 2019-05-19 DIAGNOSIS — Z7189 Other specified counseling: Secondary | ICD-10-CM | POA: Insufficient documentation

## 2019-05-19 DIAGNOSIS — E1169 Type 2 diabetes mellitus with other specified complication: Secondary | ICD-10-CM

## 2019-05-19 NOTE — Assessment & Plan Note (Signed)
Hyperlipidemia:Low fat diet discussed and encouraged.   Lipid Panel  Lab Results  Component Value Date   CHOL 189 08/18/2018   HDL 32 (L) 08/18/2018   LDLCALC 112 (H) 08/18/2018   TRIG 224 (H) 08/18/2018   CHOLHDL 5.9 (H) 08/18/2018   Updated lab needed at/ before next visit.

## 2019-05-19 NOTE — Assessment & Plan Note (Signed)
Covid-19 Education  The signs and symptoms of of COVID -19 were discussed with the patient and how to seek care for testing. ( follow up with PCP or arrange  E-visit) The importance of social  distancing is discussed today.  

## 2019-05-19 NOTE — Assessment & Plan Note (Signed)
Improved but still a concern, will continue to ork on sleep and rest without medication, does " nap" during the daytime when able

## 2019-05-19 NOTE — Assessment & Plan Note (Signed)
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

## 2019-05-19 NOTE — Telephone Encounter (Signed)
A1C added to labs to be drawn at oncology center 05/23/2019

## 2019-05-19 NOTE — Assessment & Plan Note (Signed)
Updated lab needed at/ before next visit. Not on medication

## 2019-05-23 ENCOUNTER — Other Ambulatory Visit (HOSPITAL_COMMUNITY): Payer: 59

## 2019-05-23 ENCOUNTER — Ambulatory Visit (HOSPITAL_COMMUNITY): Payer: 59 | Admitting: Hematology

## 2019-05-23 ENCOUNTER — Encounter: Payer: Self-pay | Admitting: Family Medicine

## 2019-05-23 ENCOUNTER — Other Ambulatory Visit: Payer: Self-pay

## 2019-05-23 ENCOUNTER — Inpatient Hospital Stay (HOSPITAL_COMMUNITY): Payer: 59

## 2019-05-23 ENCOUNTER — Inpatient Hospital Stay (HOSPITAL_BASED_OUTPATIENT_CLINIC_OR_DEPARTMENT_OTHER): Payer: 59 | Admitting: Hematology

## 2019-05-23 ENCOUNTER — Inpatient Hospital Stay (HOSPITAL_COMMUNITY): Payer: 59 | Attending: Hematology

## 2019-05-23 VITALS — BP 134/67 | HR 73 | Temp 98.1°F | Resp 18 | Wt 124.2 lb

## 2019-05-23 DIAGNOSIS — D696 Thrombocytopenia, unspecified: Secondary | ICD-10-CM | POA: Insufficient documentation

## 2019-05-23 DIAGNOSIS — R5383 Other fatigue: Secondary | ICD-10-CM | POA: Insufficient documentation

## 2019-05-23 DIAGNOSIS — R110 Nausea: Secondary | ICD-10-CM | POA: Insufficient documentation

## 2019-05-23 DIAGNOSIS — E785 Hyperlipidemia, unspecified: Secondary | ICD-10-CM | POA: Diagnosis not present

## 2019-05-23 DIAGNOSIS — F17210 Nicotine dependence, cigarettes, uncomplicated: Secondary | ICD-10-CM

## 2019-05-23 DIAGNOSIS — C184 Malignant neoplasm of transverse colon: Secondary | ICD-10-CM

## 2019-05-23 DIAGNOSIS — D7589 Other specified diseases of blood and blood-forming organs: Secondary | ICD-10-CM | POA: Diagnosis not present

## 2019-05-23 DIAGNOSIS — Z1321 Encounter for screening for nutritional disorder: Secondary | ICD-10-CM

## 2019-05-23 DIAGNOSIS — Z5189 Encounter for other specified aftercare: Secondary | ICD-10-CM | POA: Insufficient documentation

## 2019-05-23 DIAGNOSIS — Z79899 Other long term (current) drug therapy: Secondary | ICD-10-CM | POA: Diagnosis not present

## 2019-05-23 DIAGNOSIS — Z7982 Long term (current) use of aspirin: Secondary | ICD-10-CM | POA: Diagnosis not present

## 2019-05-23 DIAGNOSIS — E559 Vitamin D deficiency, unspecified: Secondary | ICD-10-CM | POA: Diagnosis not present

## 2019-05-23 DIAGNOSIS — E782 Mixed hyperlipidemia: Secondary | ICD-10-CM

## 2019-05-23 DIAGNOSIS — Z5111 Encounter for antineoplastic chemotherapy: Secondary | ICD-10-CM | POA: Insufficient documentation

## 2019-05-23 DIAGNOSIS — E1169 Type 2 diabetes mellitus with other specified complication: Secondary | ICD-10-CM

## 2019-05-23 DIAGNOSIS — Z8249 Family history of ischemic heart disease and other diseases of the circulatory system: Secondary | ICD-10-CM | POA: Diagnosis not present

## 2019-05-23 DIAGNOSIS — Z452 Encounter for adjustment and management of vascular access device: Secondary | ICD-10-CM | POA: Diagnosis present

## 2019-05-23 LAB — CBC WITH DIFFERENTIAL/PLATELET
Abs Immature Granulocytes: 0.01 10*3/uL (ref 0.00–0.07)
Basophils Absolute: 0 10*3/uL (ref 0.0–0.1)
Basophils Relative: 1 %
Eosinophils Absolute: 0 10*3/uL (ref 0.0–0.5)
Eosinophils Relative: 1 %
HCT: 35.8 % — ABNORMAL LOW (ref 36.0–46.0)
Hemoglobin: 11.8 g/dL — ABNORMAL LOW (ref 12.0–15.0)
Immature Granulocytes: 0 %
Lymphocytes Relative: 44 %
Lymphs Abs: 1.9 10*3/uL (ref 0.7–4.0)
MCH: 31.4 pg (ref 26.0–34.0)
MCHC: 33 g/dL (ref 30.0–36.0)
MCV: 95.2 fL (ref 80.0–100.0)
Monocytes Absolute: 0.3 10*3/uL (ref 0.1–1.0)
Monocytes Relative: 7 %
Neutro Abs: 2.1 10*3/uL (ref 1.7–7.7)
Neutrophils Relative %: 47 %
Platelets: 101 10*3/uL — ABNORMAL LOW (ref 150–400)
RBC: 3.76 MIL/uL — ABNORMAL LOW (ref 3.87–5.11)
RDW: 16.1 % — ABNORMAL HIGH (ref 11.5–15.5)
WBC: 4.3 10*3/uL (ref 4.0–10.5)
nRBC: 0 % (ref 0.0–0.2)

## 2019-05-23 LAB — COMPREHENSIVE METABOLIC PANEL
ALT: 21 U/L (ref 0–44)
AST: 30 U/L (ref 15–41)
Albumin: 4.2 g/dL (ref 3.5–5.0)
Alkaline Phosphatase: 97 U/L (ref 38–126)
Anion gap: 11 (ref 5–15)
BUN: 12 mg/dL (ref 8–23)
CO2: 22 mmol/L (ref 22–32)
Calcium: 9.8 mg/dL (ref 8.9–10.3)
Chloride: 107 mmol/L (ref 98–111)
Creatinine, Ser: 0.69 mg/dL (ref 0.44–1.00)
GFR calc Af Amer: >60 mL/min (ref >60)
GFR calc non Af Amer: >60 mL/min (ref >60)
Glucose, Bld: 106 mg/dL — ABNORMAL HIGH (ref 70–99)
Potassium: 3.8 mmol/L (ref 3.5–5.1)
Sodium: 140 mmol/L (ref 135–145)
Total Bilirubin: 0.8 mg/dL (ref 0.3–1.2)
Total Protein: 7.3 g/dL (ref 6.5–8.1)

## 2019-05-23 LAB — HEMOGLOBIN A1C
Hgb A1c MFr Bld: 5.7 % — ABNORMAL HIGH (ref 4.8–5.6)
Mean Plasma Glucose: 116.89 mg/dL

## 2019-05-23 LAB — LIPID PANEL
Cholesterol: 179 mg/dL (ref 0–200)
HDL: 37 mg/dL — ABNORMAL LOW (ref >40)
LDL Cholesterol: 106 mg/dL — ABNORMAL HIGH (ref 0–99)
Total CHOL/HDL Ratio: 4.8 RATIO
Triglycerides: 182 mg/dL — ABNORMAL HIGH (ref <150)
VLDL: 36 mg/dL (ref 0–40)

## 2019-05-23 MED ORDER — FLUOROURACIL CHEMO INJECTION 2.5 GM/50ML
400.0000 mg/m2 | Freq: Once | INTRAVENOUS | Status: AC
  Administered 2019-05-23: 16:00:00 650 mg via INTRAVENOUS
  Filled 2019-05-23: qty 13

## 2019-05-23 MED ORDER — LEUCOVORIN CALCIUM INJECTION 350 MG
400.0000 mg/m2 | Freq: Once | INTRAVENOUS | Status: AC
  Administered 2019-05-23: 11:00:00 640 mg via INTRAVENOUS
  Filled 2019-05-23: qty 32

## 2019-05-23 MED ORDER — OXALIPLATIN CHEMO INJECTION 100 MG/20ML
68.0000 mg/m2 | Freq: Once | INTRAVENOUS | Status: AC
  Administered 2019-05-23: 11:00:00 110 mg via INTRAVENOUS
  Filled 2019-05-23: qty 20

## 2019-05-23 MED ORDER — SODIUM CHLORIDE 0.9% FLUSH
10.0000 mL | INTRAVENOUS | Status: DC | PRN
  Administered 2019-05-23: 08:00:00 10 mL
  Filled 2019-05-23: qty 10

## 2019-05-23 MED ORDER — PALONOSETRON HCL INJECTION 0.25 MG/5ML
0.2500 mg | Freq: Once | INTRAVENOUS | Status: AC
  Administered 2019-05-23: 0.25 mg via INTRAVENOUS
  Filled 2019-05-23: qty 5

## 2019-05-23 MED ORDER — SODIUM CHLORIDE 0.9 % IV SOLN
10.0000 mg | Freq: Once | INTRAVENOUS | Status: AC
  Administered 2019-05-23: 10 mg via INTRAVENOUS
  Filled 2019-05-23: qty 10

## 2019-05-23 MED ORDER — DEXTROSE 5 % IV SOLN
Freq: Once | INTRAVENOUS | Status: AC
  Administered 2019-05-23: 09:00:00 via INTRAVENOUS

## 2019-05-23 MED ORDER — LANREOTIDE ACETATE 120 MG/0.5ML ~~LOC~~ SOLN
SUBCUTANEOUS | Status: AC
  Filled 2019-05-23: qty 120

## 2019-05-23 MED ORDER — SODIUM CHLORIDE 0.9 % IV SOLN
2400.0000 mg/m2 | INTRAVENOUS | Status: DC
  Administered 2019-05-23: 16:00:00 3850 mg via INTRAVENOUS
  Filled 2019-05-23: qty 77

## 2019-05-23 NOTE — Progress Notes (Signed)
To treatment area for chemotherapy.  Patient stated no changes with neuropathy.  Also stated no problems with drooling or tongue swelling.  No s/s of distress noted.   Patient seen by Reynolds Bowl, NP, with lab review and verbal order ok to treat today.    Patient tolerated chemotherapy with no complaints voiced.  Port site clean and dry with no bruising or swelling noted at site.  Good blood return noted before and after administration of chemotherapy.  Tegaderm dressing intact.  Chemotherapy pump connected with no alarms noted.  Patient left ambulatory with VSS and no s/s of distress noted.

## 2019-05-23 NOTE — Progress Notes (Signed)
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:  CURRENT THERAPY: FOLFOX  BRIEF ONCOLOGIC HISTORY:    Colon cancer ([facility])   01/09/2019 Initial Diagnosis    Colon cancer ([facility])    03/01/2019 -  Chemotherapy    The patient had palonosetron (ALOXI) injection 0.25 mg, 0.25 mg, Intravenous,  Once, 6 of 12 cycles Administration: 0.25 mg (03/01/2019), 0.25 mg (03/15/2019), 0.25 mg (03/27/2019), 0.25 mg (04/10/2019), 0.25 mg (05/09/2019), 0.25 mg (05/23/2019) pegfilgrastim (NEULASTA) injection 6 mg, 6 mg, Subcutaneous, Once, 2 of 8 cycles Administration: 6 mg (05/11/2019) leucovorin 600 mg in dextrose 5 % 250 mL infusion, 640 mg, Intravenous,  Once, 6 of 12 cycles Administration: 600 mg (03/01/2019), 600 mg (03/15/2019), 640 mg (03/27/2019), 640 mg (04/10/2019), 640 mg (05/09/2019) oxaliplatin (ELOXATIN) 135 mg in dextrose 5 % 500 mL chemo infusion, 85 mg/m2 = 135 mg, Intravenous,  Once, 6 of 12 cycles Dose modification: 68 mg/m2 (80 % of original dose 85 mg/m2, Cycle 5, Reason: Provider Judgment), 68 mg/m2 (original dose 85 mg/m2, Cycle 6, Reason: Provider Judgment) Administration: 135 mg (03/01/2019), 135 mg (03/15/2019), 135 mg (03/27/2019), 135 mg (04/10/2019), 110 mg (05/09/2019) fluorouracil (ADRUCIL) chemo injection 650 mg, 400 mg/m2 = 650 mg, Intravenous,  Once, 6 of 12 cycles Administration: 650 mg (03/01/2019), 650 mg (03/15/2019), 650 mg (03/27/2019), 650 mg (04/10/2019), 650 mg (05/09/2019) fluorouracil (ADRUCIL) 3,850 mg in sodium chloride 0.9 % 73 mL chemo infusion, 2,400 mg/m2 = 3,850 mg, Intravenous, 1 Day/Dose, 6 of 12 cycles Administration: 3,850 mg (03/01/2019), 3,850 mg (03/15/2019), 3,850 mg (03/27/2019), 3,850 mg (04/10/2019), 3,850 mg (05/09/2019)  for chemotherapy  treatment.        INTERVAL HISTORY:  Ms. Nilsen 63 y.o. female  presents toady for follow up and scheduled chemotherapy. She reports overall doing well. She denies any significant fatigue. She tolerated her last cycle of chemo exceptionally well. She reports cold sensitivity, only when exposed to cold. She denies any nausea, vomiting, diarrhea. Appetite is stable. Weight is stable. She denies any fevers or signs and symptoms of infection. States she is ready to proceed with treatment today.   REVIEW OF SYSTEMS:  Review of Systems  Constitutional: Negative.   HENT:  Negative.   Eyes: Negative.   Respiratory: Negative.   Cardiovascular: Negative.   Gastrointestinal: Negative.   Endocrine: Negative.   Genitourinary: Negative.    Musculoskeletal: Negative.   Skin: Negative.   Neurological: Negative.   Hematological: Negative.   Psychiatric/Behavioral: Negative.      PAST MEDICAL/SURGICAL HISTORY:  Past Medical History:  Diagnosis Date  . Hyperlipidemia   . Multinodular goiter   . Prediabetes    Past Surgical History:  Procedure Laterality Date  . BIOPSY  12/07/2018   Procedure: BIOPSY;  Surgeon: Rogene Houston, MD;  [facility];  Service: Endoscopy;;  sigmoid colon  . COLON RESECTION N/A 01/09/2019   Procedure: LAPAROSCOPIC RIGHT HEMICOLECTOMY;  Surgeon: Virl Cagey, MD;  Location: [facility];  Service: General;  Laterality: N/A;  . COLONOSCOPY N/A 12/07/2018   Procedure: COLONOSCOPY;  Surgeon: Rogene Houston, MD;  Location: [facility];  Service: Endoscopy;  Laterality: N/A;  12:45  . DILATION AND CURETTAGE OF UTERUS  2006  . FNA thyroid  2011 and  2019   benign  . POLYPECTOMY  12/07/2018   Procedure: POLYPECTOMY;  Surgeon: Rogene Houston, MD;  Location: [facility];  Service: Endoscopy;;  splenic flexure (CS x1)  . PORTACATH PLACEMENT Left 02/15/2019   Procedure: INSERTION PORT-A-CATH;  Surgeon: Virl Cagey, MD;  Location: [facility];  Service:  General;  Laterality: Left;  . TUBAL LIGATION  1992     SOCIAL HISTORY:  Social History   Socioeconomic History  . Marital status: Married    Spouse name: Not on file  . Number of children: 1  . Years of education: Not on file  . Highest education level: Not on file  Occupational History  . Occupation: [employer]  . Financial resource strain: Not on file  . Food insecurity:    Worry: Not on file    Inability: Not on file  . Transportation needs:    Medical: Not on file    Non-medical: Not on file  Tobacco Use  . Smoking status: Current Every Day Smoker    Packs/day: 0.75    Years: 45.00    Pack years: 33.75    Types: Cigarettes  . Smokeless tobacco: Never Used  Substance and Sexual Activity  . Alcohol use: Yes    Alcohol/week: 0.0 standard drinks    Comment: occasionally  . Drug use: No  . Sexual activity: Yes  Lifestyle  . Physical activity:    Days per week: Not on file    Minutes per session: Not on file  . Stress: Not on file  Relationships  . Social connections:    Talks on phone: Not on file    Gets together: Not on file    Attends religious service: Not on file    Active member of club or organization: Not on file    Attends meetings of clubs or organizations: Not on file    Relationship status: Not on file  . Intimate partner violence:    Fear of current or ex partner: Not on file    Emotionally abused: Not on file    Physically abused: Not on file    Forced sexual activity: Not on file  Other Topics Concern  . Not on file  Social History Narrative  . Not on file    FAMILY HISTORY:  Family History  Problem Relation Age of Onset  . Hypertension Mother        AAA  . Coronary artery disease Mother   . Hyperlipidemia Mother   . Cancer Mother        lung  . Hypertension Father   . Cancer Brother     CURRENT MEDICATIONS:  Outpatient Encounter Medications as of 05/23/2019  Medication Sig Note  . acetaminophen  (TYLENOL) 500 MG tablet Take 1,000 mg by mouth every 6 (six) hours as needed for moderate pain.    Marland Kitchen ALPRAZolam (XANAX) 0.25 MG tablet Take one tablet at bedtime, as needed , for anxiety   . aspirin EC 81 MG tablet Take 1 tablet (81 mg total) by mouth every evening.   . Calcium Carbonate-Vitamin D (CALCIUM 600+D) 600-400 MG-UNIT tablet Take 1 tablet by mouth 2 (two) times a week. Sunday and Wednesday evenings   . docusate sodium (COLACE) 100 MG capsule Take 1 capsule (100 mg total) by mouth 2 (two) times daily.   . fluorouracil CALGB 19379 in sodium chloride 0.9 % 150 mL Inject into the vein 2 days. Every 14 days 02/02/2019:  Has not started yet  . LEUCOVORIN CALCIUM IV Inject into the vein every 14 (fourteen) days. 02/02/2019: Has not started yet  . lidocaine-prilocaine (EMLA) cream Apply to port a cath site one hour prior to chemotherapy appointment to numb the skin over your port site. 02/02/2019: Has not started yet  . ondansetron (ZOFRAN-ODT) 4 MG disintegrating tablet Take 1 tablet (4 mg total) by mouth every 6 (six) hours as needed for nausea.   . OXALIPLATIN IV Inject into the vein every 14 (fourteen) days. 02/02/2019: Has not started yet  . pravastatin (PRAVACHOL) 40 MG tablet TAKE 1 TABLET BY MOUTH  DAILY IN THE EVENING (Patient taking differently: Take 40 mg by mouth 4 (four) times a week. )    Facility-Administered Encounter Medications as of 05/23/2019  Medication  . [COMPLETED] dexamethasone (DECADRON) 10 mg in sodium chloride 0.9 % 50 mL IVPB  . [COMPLETED] dextrose 5 % solution  . [COMPLETED] dextrose 5 % solution  . fluorouracil (ADRUCIL) 3,850 mg in sodium chloride 0.9 % 73 mL chemo infusion  . fluorouracil (ADRUCIL) chemo injection 650 mg  . leucovorin 640 mg in dextrose 5 % 250 mL infusion  . oxaliplatin (ELOXATIN) 110 mg in dextrose 5 % 500 mL chemo infusion  . [COMPLETED] palonosetron (ALOXI) injection 0.25 mg  . sodium chloride flush (NS) 0.9 % injection 10 mL  . sodium  chloride flush (NS) 0.9 % injection 10 mL  . lanreotide acetate (SOMATULINE DEPOT) 120 MG/0.5ML injection    ALLERGIES:  No Known Allergies   PHYSICAL EXAM:  ECOG Performance status: 1  There were no vitals filed for this visit. There were no vitals filed for this visit.  Physical Exam Constitutional:      Appearance: Normal appearance.  HENT:     Head: Normocephalic.     Nose: Nose normal.     Mouth/Throat:     Mouth: Mucous membranes are moist.     Pharynx: Oropharynx is clear.  Eyes:     Conjunctiva/sclera: Conjunctivae normal.     Pupils: Pupils are equal, round, and reactive to light.  Neck:     Musculoskeletal: Normal range of motion.  Cardiovascular:     Rate and Rhythm: Normal rate and regular rhythm.     Pulses: Normal pulses.     Heart sounds: Normal heart sounds.  Pulmonary:     Effort: Pulmonary effort is normal.     Breath sounds: Normal breath sounds.  Abdominal:     General: Abdomen is flat. Bowel sounds are normal.     Palpations: Abdomen is soft.  Musculoskeletal: Normal range of motion.  Skin:    General: Skin is warm and dry.  Neurological:     General: No focal deficit present.     Mental Status: She is alert and oriented to person, place, and time.  Psychiatric:        Mood and Affect: Mood normal.        Behavior: Behavior normal.        Thought Content: Thought content normal.        Judgment: Judgment normal.      LABORATORY DATA:  I have reviewed the labs as listed.  CBC    Component Value Date/Time   WBC 4.3 05/23/2019 0743   RBC 3.76 (L) 05/23/2019 0743   HGB 11.8 (L) 05/23/2019 0743   HGB 13.5 08/18/2018 0758   HCT 35.8 (L) 05/23/2019 0743   HCT 37.2 08/18/2018 0758   PLT 101 (L) 05/23/2019  0743   PLT 259 08/18/2018 0758   MCV 95.2 05/23/2019 0743   MCV 104 (H) 08/18/2018 0758   MCH 31.4 05/23/2019 0743   MCHC 33.0 05/23/2019 0743   RDW 16.1 (H) 05/23/2019 0743   RDW 13.0 08/18/2018 0758   LYMPHSABS 1.9 05/23/2019  0743   LYMPHSABS 3.5 (H) 08/31/2017 0955   MONOABS 0.3 05/23/2019 0743   EOSABS 0.0 05/23/2019 0743   EOSABS 0.1 08/31/2017 0955   BASOSABS 0.0 05/23/2019 0743   BASOSABS 0.0 08/31/2017 0955   CMP Latest Ref Rng & Units 05/23/2019 05/08/2019 05/03/2019  Glucose 70 - 99 mg/dL 106(H) 104(H) 99  BUN 8 - 23 mg/dL '12 9 10  ' Creatinine 0.44 - 1.00 mg/dL 0.69 0.66 0.65  Sodium 135 - 145 mmol/L 140 139 140  Potassium 3.5 - 5.1 mmol/L 3.8 3.6 3.6  Chloride 98 - 111 mmol/L 107 103 105  CO2 22 - 32 mmol/L '22 27 25  ' Calcium 8.9 - 10.3 mg/dL 9.8 10.0 9.7  Total Protein 6.5 - 8.1 g/dL 7.3 7.5 7.2  Total Bilirubin 0.3 - 1.2 mg/dL 0.8 0.8 0.8  Alkaline Phos 38 - 126 U/L 97 84 72  AST 15 - 41 U/L 30 25 33  ALT 0 - 44 U/L '21 22 25       ' ASSESSMENT & PLAN:   Colon cancer ([facility]) 1.  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  Biopsy was consistent with invasive poorly differentiated adenocarcinoma.  Small polyp was found in the proximal sigmoid colon which was sessile.  External hemorrhoids were present. -CT of the abdomen and pelvis on 12/16/2018 showing a 4.2 x 2.9 cm near circumferential apple core type mass involving the proximal transverse colon with a few small lymph nodes noted in the pericolonic fat.  No retroperitoneal adenopathy or evidence of liver mets. -Laparoscopic right colectomy on 01/09/2019, pathology showing 4 cm moderately differentiated invasive adenocarcinoma, grade 2, 1/25 lymph nodes positive, no tumor deposits, no perineural invasion.  Tumor extends through serosa into adherent omentum.  Margins are negative.  - MMR protein IHC shows loss of nuclear expression of MLH1.  MSI was high by PCR.  BRAF testing was negative. - PET CT scan on 01/31/2019 shows focal hypermetabolic activity identified at the ileocolic anastomosis with SUV max of 9.  This could  be related to postsurgical changes, but follow-up as needed.  Low-level FDG uptake in the midline scar likely related to healing. -CEA level on 01/18/2019 was mildly elevated at 5.1. - MSI-high profile by PCR.  BRAF mutation was negative.  MLH1 hyper methylation was present.  Even though there have been a few reported cases of germline hyper methylation of the CPG islands on the MLH1 promoter region, the presence of such hyper methylation has been documented in approximately 20% of sporadic colorectal cancers, which correlated with loss of MLH1 expression and high MSI.  Thus, tumors exhibiting IHC loss of MLH1 and the presence of MLH1 promoter methylation are presumed more likely to be a sporadic cancer. -4 cycles of adjuvant FOLFOX from 03/01/2019 through 04/10/2019.  - Cycle 5 was held on 04/26/2019 secondary to neutropenia and thrombocytopenia. -She experienced difficulty moving her hands and fingers and dysarthria that occurred for several days after treatment and then resolved.  Most likely due to oxaliplatin.  We have also recommended discontinuing Compazine.  She will use Zofran instead. -We will increase oxaliplatin  infusion time to 4 hours. -Neupogen added to her regimen to maintain adequate WBC count.  -Labs are acceptable to proceed with Cycle 6 today.  - RTC in 2 weeks.   2.  Macrocytosis: -She had macrocytosis prior to start of therapy.  Work-up was negative. -This has improved over time.        Roger Shelter, [place] [phone]

## 2019-05-23 NOTE — Assessment & Plan Note (Signed)
1.  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  Biopsy was consistent with invasive poorly differentiated adenocarcinoma.  Small polyp was found in the proximal sigmoid colon which was sessile.  External hemorrhoids were present. -CT of the abdomen and pelvis on 12/16/2018 showing a 4.2 x 2.9 cm near circumferential apple core type mass involving the proximal transverse colon with a few small lymph nodes noted in the pericolonic fat.  No retroperitoneal adenopathy or evidence of liver mets. -Laparoscopic right colectomy on 01/09/2019, pathology showing 4 cm moderately differentiated invasive adenocarcinoma, grade 2, 1/25 lymph nodes positive, no tumor deposits, no perineural invasion.  Tumor extends through serosa into adherent omentum.  Margins are negative.  - MMR protein IHC shows loss of nuclear expression of MLH1.  MSI was high by PCR.  BRAF testing was negative. - PET CT scan on 01/31/2019 shows focal hypermetabolic activity identified at the ileocolic anastomosis with SUV max of 9.  This could be related to postsurgical changes, but follow-up as needed.  Low-level FDG uptake in the midline scar likely related to healing. -CEA level on 01/18/2019 was mildly elevated at 5.1. - MSI-high profile by PCR.  BRAF mutation was negative.  MLH1 hyper methylation was present.  Even though there have been a few reported cases of germline hyper methylation of the CPG islands on the MLH1 promoter region, the presence of such hyper methylation has been documented in approximately 20% of sporadic colorectal cancers, which correlated with loss of MLH1 expression and high MSI.  Thus, tumors exhibiting IHC loss of MLH1 and the presence of MLH1 promoter methylation are presumed more likely to be a sporadic cancer. -4 cycles of adjuvant FOLFOX from 03/01/2019 through 04/10/2019.  - Cycle 5 was  held on 04/26/2019 secondary to neutropenia and thrombocytopenia. -She experienced difficulty moving her hands and fingers and dysarthria that occurred for several days after treatment and then resolved.  Most likely due to oxaliplatin.  We have also recommended discontinuing Compazine.  She will use Zofran instead. -We will increase oxaliplatin infusion time to 4 hours. -Neupogen added to her regimen to maintain adequate WBC count.  -Labs are acceptable to proceed with Cycle 6 today.  - RTC in 2 weeks.   2.  Macrocytosis: -She had macrocytosis prior to start of therapy.  Work-up was negative. -This has improved over time.

## 2019-05-24 LAB — VITAMIN D 25 HYDROXY (VIT D DEFICIENCY, FRACTURES): Vit D, 25-Hydroxy: 14.8 ng/mL — ABNORMAL LOW (ref 30.0–100.0)

## 2019-05-25 ENCOUNTER — Encounter (HOSPITAL_COMMUNITY): Payer: Self-pay

## 2019-05-25 ENCOUNTER — Other Ambulatory Visit: Payer: Self-pay

## 2019-05-25 ENCOUNTER — Inpatient Hospital Stay (HOSPITAL_COMMUNITY): Payer: 59

## 2019-05-25 VITALS — BP 134/73 | HR 83 | Temp 98.5°F | Resp 18

## 2019-05-25 DIAGNOSIS — C184 Malignant neoplasm of transverse colon: Secondary | ICD-10-CM

## 2019-05-25 DIAGNOSIS — Z5111 Encounter for antineoplastic chemotherapy: Secondary | ICD-10-CM | POA: Diagnosis not present

## 2019-05-25 MED ORDER — PEGFILGRASTIM INJECTION 6 MG/0.6ML ~~LOC~~
PREFILLED_SYRINGE | SUBCUTANEOUS | Status: AC
  Filled 2019-05-25: qty 0.6

## 2019-05-25 MED ORDER — ONDANSETRON 4 MG PO TBDP: 4.0000 mg | ORAL_TABLET | Freq: Four times a day (QID) | ORAL | 0 refills | Status: DC | PRN

## 2019-05-25 MED ORDER — SODIUM CHLORIDE 0.9% FLUSH
10.0000 mL | INTRAVENOUS | Status: DC | PRN
  Administered 2019-05-25: 10 mL
  Filled 2019-05-25: qty 10

## 2019-05-25 MED ORDER — HEPARIN SOD (PORK) LOCK FLUSH 100 UNIT/ML IV SOLN
500.0000 [IU] | Freq: Once | INTRAVENOUS | Status: AC | PRN
  Administered 2019-05-25: 500 [IU]

## 2019-05-25 MED ORDER — PEGFILGRASTIM INJECTION 6 MG/0.6ML ~~LOC~~
6.0000 mg | PREFILLED_SYRINGE | Freq: Once | SUBCUTANEOUS | Status: AC
  Administered 2019-05-25: 6 mg via SUBCUTANEOUS

## 2019-05-25 NOTE — Patient Instructions (Signed)
[place] at [facility] Discharge Instructions  5FU pump discontinued today with portacath flushed per protocol.Neulasta injection given as well. Follow-up as scheduled. Call clinic for any questions or concerns   Thank you for choosing [place] at [facility] to provide your oncology and hematology care.  To afford each patient quality time with our provider, please arrive at least 15 minutes before your scheduled appointment time.   If you have a lab appointment with the [facility] please come in thru the  Main Entrance and check in at the main information desk  You need to re-schedule your appointment should you arrive 10 or more minutes late.  We strive to give you quality time with our providers, and arriving late affects you and other patients whose appointments are after yours.  Also, if you no show three or more times for appointments you may be dismissed from the clinic at the providers discretion.     Again, thank you for choosing [facility].  Our hope is that these requests will decrease the amount of time that you wait before being seen by our physicians.       _____________________________________________________________  Should you have questions after your visit to [facility], please contact our office at [phone] between the hours of 8:00 a.m. and 4:30 p.m.  Voicemails left after 4:00 p.m. will not be returned until the following business day.  For prescription refill requests, have your pharmacy contact our office and allow 72 hours.    Cancer Center Support Programs:   > Cancer Support Group  2nd Tuesday of the month 1pm-2pm, Journey Room

## 2019-05-25 NOTE — Progress Notes (Signed)
Andrea Simon tolerated 5FU pump well without complaints or incident. 5FU pump discontinued with portacath flushed easily per protocol then de-accessed. Neulasta injection given without any issues. VSS Pt discharged self ambulatory in satisfactory ocndition

## 2019-06-05 ENCOUNTER — Other Ambulatory Visit: Payer: Self-pay

## 2019-06-06 ENCOUNTER — Encounter (HOSPITAL_COMMUNITY): Payer: Self-pay | Admitting: Hematology

## 2019-06-06 ENCOUNTER — Inpatient Hospital Stay (HOSPITAL_BASED_OUTPATIENT_CLINIC_OR_DEPARTMENT_OTHER): Payer: 59 | Admitting: Hematology

## 2019-06-06 ENCOUNTER — Inpatient Hospital Stay (HOSPITAL_COMMUNITY): Payer: 59

## 2019-06-06 VITALS — BP 128/83 | HR 74 | Temp 98.5°F | Resp 18

## 2019-06-06 DIAGNOSIS — Z7982 Long term (current) use of aspirin: Secondary | ICD-10-CM

## 2019-06-06 DIAGNOSIS — C184 Malignant neoplasm of transverse colon: Secondary | ICD-10-CM | POA: Diagnosis not present

## 2019-06-06 DIAGNOSIS — D7589 Other specified diseases of blood and blood-forming organs: Secondary | ICD-10-CM | POA: Diagnosis not present

## 2019-06-06 DIAGNOSIS — E785 Hyperlipidemia, unspecified: Secondary | ICD-10-CM

## 2019-06-06 DIAGNOSIS — Z79899 Other long term (current) drug therapy: Secondary | ICD-10-CM

## 2019-06-06 DIAGNOSIS — Z5111 Encounter for antineoplastic chemotherapy: Secondary | ICD-10-CM | POA: Diagnosis not present

## 2019-06-06 DIAGNOSIS — F17210 Nicotine dependence, cigarettes, uncomplicated: Secondary | ICD-10-CM

## 2019-06-06 DIAGNOSIS — D696 Thrombocytopenia, unspecified: Secondary | ICD-10-CM | POA: Diagnosis not present

## 2019-06-06 LAB — COMPREHENSIVE METABOLIC PANEL
ALT: 21 U/L (ref 0–44)
AST: 29 U/L (ref 15–41)
Albumin: 4.1 g/dL (ref 3.5–5.0)
Alkaline Phosphatase: 107 U/L (ref 38–126)
Anion gap: 11 (ref 5–15)
BUN: 11 mg/dL (ref 8–23)
CO2: 24 mmol/L (ref 22–32)
Calcium: 9.6 mg/dL (ref 8.9–10.3)
Chloride: 104 mmol/L (ref 98–111)
Creatinine, Ser: 0.64 mg/dL (ref 0.44–1.00)
GFR calc Af Amer: >60 mL/min (ref >60)
GFR calc non Af Amer: >60 mL/min (ref >60)
Glucose, Bld: 107 mg/dL — ABNORMAL HIGH (ref 70–99)
Potassium: 3.9 mmol/L (ref 3.5–5.1)
Sodium: 139 mmol/L (ref 135–145)
Total Bilirubin: 0.5 mg/dL (ref 0.3–1.2)
Total Protein: 7.1 g/dL (ref 6.5–8.1)

## 2019-06-06 LAB — CBC WITH DIFFERENTIAL/PLATELET
Abs Immature Granulocytes: 0.02 10*3/uL (ref 0.00–0.07)
Basophils Absolute: 0 10*3/uL (ref 0.0–0.1)
Basophils Relative: 1 %
Eosinophils Absolute: 0 10*3/uL (ref 0.0–0.5)
Eosinophils Relative: 0 %
HCT: 36.7 % (ref 36.0–46.0)
Hemoglobin: 11.9 g/dL — ABNORMAL LOW (ref 12.0–15.0)
Immature Granulocytes: 0 %
Lymphocytes Relative: 36 %
Lymphs Abs: 1.7 10*3/uL (ref 0.7–4.0)
MCH: 30.8 pg (ref 26.0–34.0)
MCHC: 32.4 g/dL (ref 30.0–36.0)
MCV: 95.1 fL (ref 80.0–100.0)
Monocytes Absolute: 0.3 10*3/uL (ref 0.1–1.0)
Monocytes Relative: 7 %
Neutro Abs: 2.5 10*3/uL (ref 1.7–7.7)
Neutrophils Relative %: 56 %
Platelets: 84 10*3/uL — ABNORMAL LOW (ref 150–400)
RBC: 3.86 MIL/uL — ABNORMAL LOW (ref 3.87–5.11)
RDW: 16.6 % — ABNORMAL HIGH (ref 11.5–15.5)
WBC: 4.6 10*3/uL (ref 4.0–10.5)
nRBC: 0 % (ref 0.0–0.2)

## 2019-06-06 MED ORDER — OXALIPLATIN CHEMO INJECTION 100 MG/20ML
68.0000 mg/m2 | Freq: Once | INTRAVENOUS | Status: AC
  Administered 2019-06-06: 11:00:00 110 mg via INTRAVENOUS
  Filled 2019-06-06: qty 2

## 2019-06-06 MED ORDER — LEUCOVORIN CALCIUM INJECTION 350 MG
400.0000 mg/m2 | Freq: Once | INTRAVENOUS | Status: AC
  Administered 2019-06-06: 11:00:00 640 mg via INTRAVENOUS
  Filled 2019-06-06: qty 32

## 2019-06-06 MED ORDER — FLUOROURACIL CHEMO INJECTION 2.5 GM/50ML
400.0000 mg/m2 | Freq: Once | INTRAVENOUS | Status: AC
  Administered 2019-06-06: 650 mg via INTRAVENOUS
  Filled 2019-06-06: qty 13

## 2019-06-06 MED ORDER — SODIUM CHLORIDE 0.9 % IV SOLN
10.0000 mg | Freq: Once | INTRAVENOUS | Status: AC
  Administered 2019-06-06: 10 mg via INTRAVENOUS
  Filled 2019-06-06: qty 10

## 2019-06-06 MED ORDER — SODIUM CHLORIDE 0.9% FLUSH
10.0000 mL | INTRAVENOUS | Status: DC | PRN
  Administered 2019-06-06: 16:00:00 10 mL
  Filled 2019-06-06: qty 10

## 2019-06-06 MED ORDER — SODIUM CHLORIDE 0.9 % IV SOLN
2400.0000 mg/m2 | INTRAVENOUS | Status: DC
  Administered 2019-06-06: 16:00:00 3850 mg via INTRAVENOUS
  Filled 2019-06-06: qty 77

## 2019-06-06 MED ORDER — DEXTROSE 5 % IV SOLN
Freq: Once | INTRAVENOUS | Status: AC
  Administered 2019-06-06: 09:00:00 via INTRAVENOUS

## 2019-06-06 MED ORDER — PALONOSETRON HCL INJECTION 0.25 MG/5ML
0.2500 mg | Freq: Once | INTRAVENOUS | Status: AC
  Administered 2019-06-06: 10:00:00 0.25 mg via INTRAVENOUS

## 2019-06-06 MED ORDER — PALONOSETRON HCL INJECTION 0.25 MG/5ML
INTRAVENOUS | Status: AC
  Filled 2019-06-06: qty 5

## 2019-06-06 NOTE — Patient Instructions (Signed)
[facility] Discharge Instructions for Patients Receiving Chemotherapy  Today you received the following chemotherapy agents   To help prevent nausea and vomiting after your treatment, we encourage you to take your nausea medication   If you develop nausea and vomiting that is not controlled by your nausea medication, call the clinic.   BELOW ARE SYMPTOMS THAT SHOULD BE REPORTED IMMEDIATELY:  *FEVER GREATER THAN 100.5 F  *CHILLS WITH OR WITHOUT FEVER  NAUSEA AND VOMITING THAT IS NOT CONTROLLED WITH YOUR NAUSEA MEDICATION  *UNUSUAL SHORTNESS OF BREATH  *UNUSUAL BRUISING OR BLEEDING  TENDERNESS IN MOUTH AND THROAT WITH OR WITHOUT PRESENCE OF ULCERS  *URINARY PROBLEMS  *BOWEL PROBLEMS  UNUSUAL RASH Items with * indicate a potential emergency and should be followed up as soon as possible.  Feel free to call the clinic should you have any questions or concerns. The clinic phone number is [phone].  Please show the CHEMO ALERT CARD at check-in to the Emergency Department and triage nurse.   

## 2019-06-06 NOTE — Assessment & Plan Note (Addendum)
1.  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  Biopsy was consistent with invasive poorly differentiated adenocarcinoma.  Small polyp was found in the proximal sigmoid colon which was sessile.  External hemorrhoids were present. -CT of the abdomen and pelvis on 12/16/2018 showing a 4.2 x 2.9 cm near circumferential apple core type mass involving the proximal transverse colon with a few small lymph nodes noted in the pericolonic fat.  No retroperitoneal adenopathy or evidence of liver mets. -Laparoscopic right colectomy on 01/09/2019, pathology showing 4 cm moderately differentiated invasive adenocarcinoma, grade 2, 1/25 lymph nodes positive, no tumor deposits, no perineural invasion.  Tumor extends through serosa into adherent omentum.  Margins are negative.  - MMR protein IHC shows loss of nuclear expression of MLH1.  MSI was high by PCR.  BRAF testing was negative. - PET CT scan on 01/31/2019 shows focal hypermetabolic activity identified at the ileocolic anastomosis with SUV max of 9.  This could be related to postsurgical changes, but follow-up as needed.  Low-level FDG uptake in the midline scar likely related to healing. -CEA level on 01/18/2019 was mildly elevated at 5.1. - MSI-high profile by PCR.  BRAF mutation was negative.  MLH1 hyper methylation was present.  Even though there have been a few reported cases of germline hyper methylation of the CPG islands on the MLH1 promoter region, the presence of such hyper methylation has been documented in approximately 20% of sporadic colorectal cancers, which correlated with loss of MLH1 expression and high MSI.  Thus, tumors exhibiting IHC loss of MLH1 and the presence of MLH1 promoter methylation are presumed more likely to be a sporadic cancer. -6 cycles of adjuvant FOLFOX from 03/01/2019 through 05/23/2019.  - Cycle 5 was  delayed due to neutropenia and thrombocytopenia. -Neupogen was added to the regimen, was well-tolerated.   -She had experienced difficulty moving her hands and fingers and dysarthria lasting several days after chemotherapy.  We have discontinued Compazine and increased infusion time of oxaliplatin to 4 hours.  Although symptoms resolved once we increase the infusion time. -She is using Zofran for nausea as needed. - We reviewed her blood work.  She has mild thrombocytopenia.  She will proceed with her cycle 7 today without any dose reductions. -We will see her back in 2 weeks for follow-up.   2.  Macrocytosis: -She had macrocytosis prior to start of therapy.  Work-up was negative.  This is improved after start of chemotherapy.   

## 2019-06-06 NOTE — Progress Notes (Signed)
Pt presents today for treatment and doctor appointment. VSS. Labs reviewed by Dr. Delton Coombes. Platelet count 84. MD aware. Pt has no complaints of any changes since the last visit.   Message received from [facility] LPN, " Proceed with tx."  Treatment given today per MD orders. Tolerated infusion without adverse affects. Vital signs stable. No complaints at this time. 5 FU pump running per protocol, RUN noted on screen. Discharged from clinic ambulatory. F/U with [facility] as scheduled.

## 2019-06-06 NOTE — Patient Instructions (Addendum)
[facility] at [facility] Discharge Instructions  You were seen today by Dr. Katragadda. He went over your recent lab results. He will see you back in 2 weeks for labs and follow up.   Thank you for choosing [facility] at [facility] to provide your oncology and hematology care.  To afford each patient quality time with our provider, please arrive at least 15 minutes before your scheduled appointment time.   If you have a lab appointment with the Cancer Center please come in thru the  Main Entrance and check in at the main information desk  You need to re-schedule your appointment should you arrive 10 or more minutes late.  We strive to give you quality time with our providers, and arriving late affects you and other patients whose appointments are after yours.  Also, if you no show three or more times for appointments you may be dismissed from the clinic at the providers discretion.     Again, thank you for choosing [facility].  Our hope is that these requests will decrease the amount of time that you wait before being seen by our physicians.       _____________________________________________________________  Should you have questions after your visit to [facility], please contact our office at [phone] between the hours of 8:00 a.m. and 4:30 p.m.  Voicemails left after 4:00 p.m. will not be returned until the following business day.  For prescription refill requests, have your pharmacy contact our office and allow 72 hours.    Cancer Center Support Programs:   > Cancer Support Group  2nd Tuesday of the month 1pm-2pm, Journey Room    

## 2019-06-06 NOTE — Progress Notes (Signed)
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:  CURRENT THERAPY: FOLFOX  BRIEF ONCOLOGIC HISTORY:  Oncology History  Colon cancer ([place])  01/09/2019 Initial Diagnosis   Colon cancer ([facility])   03/01/2019 -  Chemotherapy   The patient had palonosetron (ALOXI) injection 0.25 mg, 0.25 mg, Intravenous,  Once, 7 of 12 cycles Administration: 0.25 mg (03/01/2019), 0.25 mg (03/15/2019), 0.25 mg (03/27/2019), 0.25 mg (04/10/2019), 0.25 mg (05/09/2019), 0.25 mg (05/23/2019) pegfilgrastim (NEULASTA) injection 6 mg, 6 mg, Subcutaneous, Once, 3 of 8 cycles Administration: 6 mg (05/11/2019), 6 mg (05/25/2019) leucovorin 600 mg in dextrose 5 % 250 mL infusion, 640 mg, Intravenous,  Once, 7 of 12 cycles Administration: 600 mg (03/01/2019), 600 mg (03/15/2019), 640 mg (03/27/2019), 640 mg (04/10/2019), 640 mg (05/09/2019), 640 mg (05/23/2019) oxaliplatin (ELOXATIN) 135 mg in dextrose 5 % 500 mL chemo infusion, 85 mg/m2 = 135 mg, Intravenous,  Once, 7 of 12 cycles Dose modification: 68 mg/m2 (80 % of original dose 85 mg/m2, Cycle 5, Reason: Provider Judgment), 68 mg/m2 (original dose 85 mg/m2, Cycle 6, Reason: Provider Judgment) Administration: 135 mg (03/01/2019), 135 mg (03/15/2019), 135 mg (03/27/2019), 135 mg (04/10/2019), 110 mg (05/09/2019), 110 mg (05/23/2019) fluorouracil (ADRUCIL) chemo injection 650 mg, 400 mg/m2 = 650 mg, Intravenous,  Once, 7 of 12 cycles Administration: 650 mg (03/01/2019), 650 mg (03/15/2019), 650 mg (03/27/2019), 650 mg (04/10/2019), 650 mg (05/09/2019), 650 mg (05/23/2019) fluorouracil (ADRUCIL) 3,850 mg in sodium chloride 0.9 % 73 mL chemo infusion, 2,400 mg/m2 = 3,850 mg, Intravenous, 1 Day/Dose, 7 of 12 cycles Administration: 3,850 mg (03/01/2019), 3,850 mg  (03/15/2019), 3,850 mg (03/27/2019), 3,850 mg (04/10/2019), 3,850 mg (05/09/2019), 3,850 mg (05/23/2019)  for chemotherapy treatment.        INTERVAL HISTORY:  Andrea Simon 63 y.o. female seen for follow-up and cycle 7 of chemotherapy.  She received cycle 6 on 05/23/2019.  She did not report any difficulty or cramping in her hands and fingers.  She did not have any difficulty with speech.  Had occasional nausea which is well controlled with Zofran.  She is not taking Compazine.  Appetite is 50%.  Energy levels are 75%.  No bleeding episodes reported.  No easy bruising.  Denies any fevers or night sweats.  Cold sensitivity is lasting few days after each treatment.  REVIEW OF SYSTEMS:  Review of Systems  Constitutional: Negative.   HENT:  Negative.   Eyes: Negative.   Respiratory: Negative.   Cardiovascular: Negative.   Gastrointestinal: Positive for nausea.  Endocrine: Negative.   Genitourinary: Negative.    Musculoskeletal: Negative.   Skin: Negative.   Neurological: Negative.   Hematological: Negative.   Psychiatric/Behavioral: Negative.      PAST MEDICAL/SURGICAL HISTORY:  Past Medical History:  Diagnosis Date  . Hyperlipidemia   . Multinodular goiter   . Prediabetes    Past Surgical History:  Procedure Laterality Date  . BIOPSY  12/07/2018   Procedure: BIOPSY;  Surgeon: Rogene Houston, MD;  Location: [facility];  Service: Endoscopy;;  sigmoid colon  . COLON RESECTION N/A 01/09/2019   Procedure: LAPAROSCOPIC RIGHT HEMICOLECTOMY;  Surgeon: Virl Cagey, MD;  Location: [facility] ORS;  Service: General;  Laterality: N/A;  . COLONOSCOPY N/A 12/07/2018   Procedure: COLONOSCOPY;  Surgeon:  Rogene Houston, MD;  Location: [facility];  Service: Endoscopy;  Laterality: N/A;  12:45  . DILATION AND CURETTAGE OF UTERUS  2006  . FNA thyroid  2011 and 2019   benign  . POLYPECTOMY  12/07/2018   Procedure: POLYPECTOMY;  Surgeon: Rogene Houston, MD;  Location: [facility];  Service:  Endoscopy;;  splenic flexure (CS x1)  . PORTACATH PLACEMENT Left 02/15/2019   Procedure: INSERTION PORT-A-CATH;  Surgeon: Virl Cagey, MD;  Location: [facility] ORS;  Service: General;  Laterality: Left;  . TUBAL LIGATION  1992     SOCIAL HISTORY:  Social History   Socioeconomic History  . Marital status: Married    Spouse name: Not on file  . Number of children: 1  . Years of education: Not on file  . Highest education level: Not on file  Occupational History  . Occupation: [employer]  . Financial resource strain: Not on file  . Food insecurity    Worry: Not on file    Inability: Not on file  . Transportation needs    Medical: Not on file    Non-medical: Not on file  Tobacco Use  . Smoking status: Current Every Day Smoker    Packs/day: 0.75    Years: 45.00    Pack years: 33.75    Types: Cigarettes  . Smokeless tobacco: Never Used  Substance and Sexual Activity  . Alcohol use: Yes    Alcohol/week: 0.0 standard drinks    Comment: occasionally  . Drug use: No  . Sexual activity: Yes  Lifestyle  . Physical activity    Days per week: Not on file    Minutes per session: Not on file  . Stress: Not on file  Relationships  . Social Herbalist on phone: Not on file    Gets together: Not on file    Attends religious service: Not on file    Active member of club or organization: Not on file    Attends meetings of clubs or organizations: Not on file    Relationship status: Not on file  . Intimate partner violence    Fear of current or ex partner: Not on file    Emotionally abused: Not on file    Physically abused: Not on file    Forced sexual activity: Not on file  Other Topics Concern  . Not on file  Social History Narrative  . Not on file    FAMILY HISTORY:  Family History  Problem Relation Age of Onset  . Hypertension Mother        AAA  . Coronary artery disease Mother   . Hyperlipidemia Mother   . Cancer Mother         lung  . Hypertension Father   . Cancer Brother     CURRENT MEDICATIONS:  Outpatient Encounter Medications as of 06/06/2019  Medication Sig Note  . acetaminophen (TYLENOL) 500 MG tablet Take 1,000 mg by mouth every 6 (six) hours as needed for moderate pain.    Marland Kitchen ALPRAZolam (XANAX) 0.25 MG tablet Take one tablet at bedtime, as needed , for anxiety   . aspirin EC 81 MG tablet Take 1 tablet (81 mg total) by mouth every evening.   . Calcium Carbonate-Vitamin D (CALCIUM 600+D) 600-400 MG-UNIT tablet Take 1 tablet by mouth 2 (two) times a week. Sunday and Wednesday evenings   . docusate sodium (COLACE) 100 MG capsule Take 1  capsule (100 mg total) by mouth 2 (two) times daily.   . fluorouracil CALGB 43154 in sodium chloride 0.9 % 150 mL Inject into the vein 2 days. Every 14 days 02/02/2019: Has not started yet  . LEUCOVORIN CALCIUM IV Inject into the vein every 14 (fourteen) days. 02/02/2019: Has not started yet  . lidocaine-prilocaine (EMLA) cream Apply to port a cath site one hour prior to chemotherapy appointment to numb the skin over your port site. 02/02/2019: Has not started yet  . ondansetron (ZOFRAN-ODT) 4 MG disintegrating tablet Take 1 tablet (4 mg total) by mouth every 6 (six) hours as needed for nausea.   . OXALIPLATIN IV Inject into the vein every 14 (fourteen) days. 02/02/2019: Has not started yet  . pravastatin (PRAVACHOL) 40 MG tablet TAKE 1 TABLET BY MOUTH  DAILY IN THE EVENING (Patient taking differently: Take 40 mg by mouth 4 (four) times a week. )    Facility-Administered Encounter Medications as of 06/06/2019  Medication  . sodium chloride flush (NS) 0.9 % injection 10 mL    ALLERGIES:  No Known Allergies   PHYSICAL EXAM:  ECOG Performance status: 1  Vitals:   06/06/19 0758  BP: 119/64  Pulse: 81  Resp: 18  Temp: 98.5 F (36.9 C)  SpO2: 100%   Filed Weights   06/06/19 0758  Weight: 123 lb 12.8 oz (56.2 kg)    Physical Exam Constitutional:      Appearance:  Normal appearance.  Neck:     Musculoskeletal: Normal range of motion.  Cardiovascular:     Rate and Rhythm: Normal rate and regular rhythm.     Pulses: Normal pulses.     Heart sounds: Normal heart sounds.  Pulmonary:     Effort: Pulmonary effort is normal.     Breath sounds: Normal breath sounds.  Abdominal:     General: There is no distension.     Palpations: Abdomen is soft. There is no mass.  Musculoskeletal: Normal range of motion.  Skin:    General: Skin is warm and dry.  Neurological:     General: No focal deficit present.     Mental Status: She is alert and oriented to person, place, and time.  Psychiatric:        Mood and Affect: Mood normal.        Behavior: Behavior normal.      LABORATORY DATA:  I have reviewed the labs as listed.  CBC    Component Value Date/Time   WBC 4.6 06/06/2019 0750   RBC 3.86 (L) 06/06/2019 0750   HGB 11.9 (L) 06/06/2019 0750   HGB 13.5 08/18/2018 0758   HCT 36.7 06/06/2019 0750   HCT 37.2 08/18/2018 0758   PLT 84 (L) 06/06/2019 0750   PLT 259 08/18/2018 0758   MCV 95.1 06/06/2019 0750   MCV 104 (H) 08/18/2018 0758   MCH 30.8 06/06/2019 0750   MCHC 32.4 06/06/2019 0750   RDW 16.6 (H) 06/06/2019 0750   RDW 13.0 08/18/2018 0758   LYMPHSABS 1.7 06/06/2019 0750   LYMPHSABS 3.5 (H) 08/31/2017 0955   MONOABS 0.3 06/06/2019 0750   EOSABS 0.0 06/06/2019 0750   EOSABS 0.1 08/31/2017 0955   BASOSABS 0.0 06/06/2019 0750   BASOSABS 0.0 08/31/2017 0955   CMP Latest Ref Rng & Units 06/06/2019 05/23/2019 05/08/2019  Glucose 70 - 99 mg/dL 107(H) 106(H) 104(H)  BUN 8 - 23 mg/dL _0 Creatinine 0.44 - 1.00 mg/dL 0.64 0.69 0.66  Sodium 135 -  145 mmol/L 139 140 139  Potassium 3.5 - 5.1 mmol/L 3.9 3.8 3.6  Chloride 98 - 111 mmol/L 104 107 103  CO2 22 - 32 mmol/L _0 Calcium 8.9 - 10.3 mg/dL 9.6 9.8 10.0  Total Protein 6.5 - 8.1 g/dL 7.1 7.3 7.5  Total Bilirubin 0.3 - 1.2 mg/dL 0.5 0.8 0.8  Alkaline Phos 38 - 126 U/L 107 97 84   AST 15 - 41 U/L _1 ALT 0 - 44 U/L _2 ASSESSMENT & PLAN:   Colon cancer (HCC) 1.  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  Biopsy was consistent with invasive poorly differentiated adenocarcinoma.  Small polyp was found in the proximal sigmoid colon which was sessile.  External hemorrhoids were present. -CT of the abdomen and pelvis on 12/16/2018 showing a 4.2 x 2.9 cm near circumferential apple core type mass involving the proximal transverse colon with a few small lymph nodes noted in the pericolonic fat.  No retroperitoneal adenopathy or evidence of liver mets. -Laparoscopic right colectomy on 01/09/2019, pathology showing 4 cm moderately differentiated invasive adenocarcinoma, grade 2, 1/25 lymph nodes positive, no tumor deposits, no perineural invasion.  Tumor extends through serosa into adherent omentum.  Margins are negative.  - MMR protein IHC shows loss of nuclear expression of MLH1.  MSI was high by PCR.  BRAF testing was negative. - PET CT scan on 01/31/2019 shows focal hypermetabolic activity identified at the ileocolic anastomosis with SUV max of 9.  This could be related to postsurgical changes, but follow-up as needed.  Low-level FDG uptake in the midline scar likely related to healing. -CEA level on 01/18/2019 was mildly elevated at 5.1. - MSI-high profile by PCR.  BRAF mutation was negative.  MLH1 hyper methylation was present.  Even though there have been a few reported cases of germline hyper methylation of the CPG islands on the MLH1 promoter region, the presence of such hyper methylation has been documented in approximately 20% of sporadic colorectal cancers, which correlated with loss of MLH1 expression and high MSI.  Thus, tumors exhibiting IHC loss of MLH1 and the presence of MLH1 promoter methylation are presumed more  likely to be a sporadic cancer. -6 cycles of adjuvant FOLFOX from 03/01/2019 through 05/23/2019.  - Cycle 5 was delayed due to neutropenia and thrombocytopenia. -Neupogen was added to the regimen, was well-tolerated.   -She had experienced difficulty moving her hands and fingers and dysarthria lasting several days after chemotherapy.  We have discontinued Compazine and increased infusion time of oxaliplatin to 4 hours.  Although symptoms resolved once we increase the infusion time. -She is using Zofran for nausea as needed. - We reviewed her blood work.  She has mild thrombocytopenia.  She will proceed with her cycle 7 today without any dose reductions. -We will see her back in 2 weeks for follow-up.   2.  Macrocytosis: -She had macrocytosis prior to start of therapy.  Work-up was negative.  This is improved after start of chemotherapy.     Total time spent is 25 minutes with more than 50% of the time spent face-to-face discussing lab results, treatment plan and coordination of care.   Derek Jack, MD  [place] [phone]

## 2019-06-08 ENCOUNTER — Other Ambulatory Visit: Payer: Self-pay

## 2019-06-08 ENCOUNTER — Inpatient Hospital Stay (HOSPITAL_COMMUNITY): Payer: 59

## 2019-06-08 VITALS — BP 102/60 | HR 89 | Temp 98.5°F | Resp 16

## 2019-06-08 DIAGNOSIS — C184 Malignant neoplasm of transverse colon: Secondary | ICD-10-CM

## 2019-06-08 DIAGNOSIS — Z5111 Encounter for antineoplastic chemotherapy: Secondary | ICD-10-CM | POA: Diagnosis not present

## 2019-06-08 MED ORDER — SODIUM CHLORIDE 0.9% FLUSH
10.0000 mL | INTRAVENOUS | Status: DC | PRN
  Administered 2019-06-08: 10 mL
  Filled 2019-06-08: qty 10

## 2019-06-08 MED ORDER — PEGFILGRASTIM INJECTION 6 MG/0.6ML ~~LOC~~
6.0000 mg | PREFILLED_SYRINGE | Freq: Once | SUBCUTANEOUS | Status: AC
  Administered 2019-06-08: 6 mg via SUBCUTANEOUS

## 2019-06-08 MED ORDER — HEPARIN SOD (PORK) LOCK FLUSH 100 UNIT/ML IV SOLN
500.0000 [IU] | Freq: Once | INTRAVENOUS | Status: AC | PRN
  Administered 2019-06-08: 500 [IU]

## 2019-06-08 NOTE — Patient Instructions (Signed)
[facility] at [facility] _______________________________________________________________  Thank you for choosing [facility] at [facility] to provide your oncology and hematology care.  To afford each patient quality time with our providers, please arrive at least 15 minutes before your scheduled appointment.  You need to re-schedule your appointment if you arrive 10 or more minutes late.  We strive to give you quality time with our providers, and arriving late affects you and other patients whose appointments are after yours.  Also, if you no show three or more times for appointments you may be dismissed from the clinic.  Again, thank you for choosing [facility] at [facility]. Our hope is that these requests will allow you access to exceptional care and in a timely manner. _______________________________________________________________  If you have questions after your visit, please contact our office at [phone] between the hours of 8:30 a.m. and 5:00 p.m. Voicemails left after 4:30 p.m. will not be returned until the following business day. _______________________________________________________________  For prescription refill requests, have your pharmacy contact our office. _______________________________________________________________  Recommendations made by the consultant and any test results will be sent to your referring physician. _______________________________________________________________ 

## 2019-06-20 ENCOUNTER — Inpatient Hospital Stay (HOSPITAL_COMMUNITY): Payer: 59

## 2019-06-20 ENCOUNTER — Other Ambulatory Visit: Payer: Self-pay

## 2019-06-20 ENCOUNTER — Encounter (HOSPITAL_COMMUNITY): Payer: Self-pay | Admitting: Hematology

## 2019-06-20 ENCOUNTER — Inpatient Hospital Stay (HOSPITAL_BASED_OUTPATIENT_CLINIC_OR_DEPARTMENT_OTHER): Payer: 59 | Admitting: Hematology

## 2019-06-20 VITALS — BP 115/65 | HR 82 | Temp 97.1°F | Resp 16 | Wt 123.6 lb

## 2019-06-20 VITALS — BP 130/63 | HR 63 | Resp 16

## 2019-06-20 DIAGNOSIS — C184 Malignant neoplasm of transverse colon: Secondary | ICD-10-CM | POA: Diagnosis not present

## 2019-06-20 DIAGNOSIS — E559 Vitamin D deficiency, unspecified: Secondary | ICD-10-CM

## 2019-06-20 DIAGNOSIS — R110 Nausea: Secondary | ICD-10-CM | POA: Diagnosis not present

## 2019-06-20 DIAGNOSIS — Z79899 Other long term (current) drug therapy: Secondary | ICD-10-CM

## 2019-06-20 DIAGNOSIS — Z7982 Long term (current) use of aspirin: Secondary | ICD-10-CM

## 2019-06-20 DIAGNOSIS — E785 Hyperlipidemia, unspecified: Secondary | ICD-10-CM

## 2019-06-20 DIAGNOSIS — R5383 Other fatigue: Secondary | ICD-10-CM

## 2019-06-20 DIAGNOSIS — D7589 Other specified diseases of blood and blood-forming organs: Secondary | ICD-10-CM

## 2019-06-20 DIAGNOSIS — Z8249 Family history of ischemic heart disease and other diseases of the circulatory system: Secondary | ICD-10-CM

## 2019-06-20 DIAGNOSIS — Z5111 Encounter for antineoplastic chemotherapy: Secondary | ICD-10-CM | POA: Diagnosis not present

## 2019-06-20 DIAGNOSIS — D696 Thrombocytopenia, unspecified: Secondary | ICD-10-CM

## 2019-06-20 DIAGNOSIS — F17210 Nicotine dependence, cigarettes, uncomplicated: Secondary | ICD-10-CM

## 2019-06-20 LAB — COMPREHENSIVE METABOLIC PANEL
ALT: 20 U/L (ref 0–44)
AST: 29 U/L (ref 15–41)
Albumin: 4 g/dL (ref 3.5–5.0)
Alkaline Phosphatase: 115 U/L (ref 38–126)
Anion gap: 8 (ref 5–15)
BUN: 9 mg/dL (ref 8–23)
CO2: 25 mmol/L (ref 22–32)
Calcium: 9.4 mg/dL (ref 8.9–10.3)
Chloride: 106 mmol/L (ref 98–111)
Creatinine, Ser: 0.67 mg/dL (ref 0.44–1.00)
GFR calc Af Amer: >60 mL/min (ref >60)
GFR calc non Af Amer: >60 mL/min (ref >60)
Glucose, Bld: 132 mg/dL — ABNORMAL HIGH (ref 70–99)
Potassium: 3.5 mmol/L (ref 3.5–5.1)
Sodium: 139 mmol/L (ref 135–145)
Total Bilirubin: 0.8 mg/dL (ref 0.3–1.2)
Total Protein: 7 g/dL (ref 6.5–8.1)

## 2019-06-20 LAB — CBC WITH DIFFERENTIAL/PLATELET
Abs Immature Granulocytes: 0.03 10*3/uL (ref 0.00–0.07)
Basophils Absolute: 0 10*3/uL (ref 0.0–0.1)
Basophils Relative: 1 %
Eosinophils Absolute: 0 10*3/uL (ref 0.0–0.5)
Eosinophils Relative: 1 %
HCT: 35 % — ABNORMAL LOW (ref 36.0–46.0)
Hemoglobin: 11.3 g/dL — ABNORMAL LOW (ref 12.0–15.0)
Immature Granulocytes: 1 %
Lymphocytes Relative: 30 %
Lymphs Abs: 1.3 10*3/uL (ref 0.7–4.0)
MCH: 30.7 pg (ref 26.0–34.0)
MCHC: 32.3 g/dL (ref 30.0–36.0)
MCV: 95.1 fL (ref 80.0–100.0)
Monocytes Absolute: 0.4 10*3/uL (ref 0.1–1.0)
Monocytes Relative: 9 %
Neutro Abs: 2.5 10*3/uL (ref 1.7–7.7)
Neutrophils Relative %: 58 %
Platelets: 76 10*3/uL — ABNORMAL LOW (ref 150–400)
RBC: 3.68 MIL/uL — ABNORMAL LOW (ref 3.87–5.11)
RDW: 17.1 % — ABNORMAL HIGH (ref 11.5–15.5)
WBC: 4.3 10*3/uL (ref 4.0–10.5)
nRBC: 0 % (ref 0.0–0.2)

## 2019-06-20 LAB — MAGNESIUM: Magnesium: 1.8 mg/dL (ref 1.7–2.4)

## 2019-06-20 MED ORDER — LEUCOVORIN CALCIUM INJECTION 350 MG
400.0000 mg/m2 | Freq: Once | INTRAVENOUS | Status: AC
  Administered 2019-06-20: 640 mg via INTRAVENOUS
  Filled 2019-06-20: qty 32

## 2019-06-20 MED ORDER — SODIUM CHLORIDE 0.9% FLUSH
10.0000 mL | INTRAVENOUS | Status: DC | PRN
  Administered 2019-06-20: 10 mL
  Filled 2019-06-20: qty 10

## 2019-06-20 MED ORDER — ERGOCALCIFEROL 1.25 MG (50000 UT) PO CAPS: 50000.0000 [IU] | ORAL_CAPSULE | ORAL | 0 refills | Status: DC

## 2019-06-20 MED ORDER — DEXTROSE 5 % IV SOLN
Freq: Once | INTRAVENOUS | Status: AC
  Administered 2019-06-20: 09:00:00 via INTRAVENOUS

## 2019-06-20 MED ORDER — PALONOSETRON HCL INJECTION 0.25 MG/5ML
0.2500 mg | Freq: Once | INTRAVENOUS | Status: AC
  Administered 2019-06-20: 0.25 mg via INTRAVENOUS
  Filled 2019-06-20: qty 5

## 2019-06-20 MED ORDER — SODIUM CHLORIDE 0.9 % IV SOLN
10.0000 mg | Freq: Once | INTRAVENOUS | Status: AC
  Administered 2019-06-20: 10 mg via INTRAVENOUS
  Filled 2019-06-20: qty 10

## 2019-06-20 MED ORDER — FLUOROURACIL CHEMO INJECTION 2.5 GM/50ML
400.0000 mg/m2 | Freq: Once | INTRAVENOUS | Status: AC
  Administered 2019-06-20: 650 mg via INTRAVENOUS
  Filled 2019-06-20: qty 13

## 2019-06-20 MED ORDER — SODIUM CHLORIDE 0.9 % IV SOLN
2400.0000 mg/m2 | INTRAVENOUS | Status: DC
  Administered 2019-06-20: 3850 mg via INTRAVENOUS
  Filled 2019-06-20: qty 77

## 2019-06-20 MED ORDER — OXALIPLATIN CHEMO INJECTION 100 MG/20ML
68.0000 mg/m2 | Freq: Once | INTRAVENOUS | Status: AC
  Administered 2019-06-20: 110 mg via INTRAVENOUS
  Filled 2019-06-20: qty 2

## 2019-06-20 NOTE — Patient Instructions (Signed)
[facility] at [facility] Discharge Instructions  You were seen today by Dr. Katragadda. He went over your recent lab results. He will see you back in 2 weeks for labs and follow up.   Thank you for choosing [facility] at [facility] to provide your oncology and hematology care.  To afford each patient quality time with our provider, please arrive at least 15 minutes before your scheduled appointment time.   If you have a lab appointment with the Cancer Center please come in thru the  Main Entrance and check in at the main information desk  You need to re-schedule your appointment should you arrive 10 or more minutes late.  We strive to give you quality time with our providers, and arriving late affects you and other patients whose appointments are after yours.  Also, if you no show three or more times for appointments you may be dismissed from the clinic at the providers discretion.     Again, thank you for choosing [facility].  Our hope is that these requests will decrease the amount of time that you wait before being seen by our physicians.       _____________________________________________________________  Should you have questions after your visit to [facility], please contact our office at [phone] between the hours of 8:00 a.m. and 4:30 p.m.  Voicemails left after 4:00 p.m. will not be returned until the following business day.  For prescription refill requests, have your pharmacy contact our office and allow 72 hours.    Cancer Center Support Programs:   > Cancer Support Group  2nd Tuesday of the month 1pm-2pm, Journey Room    

## 2019-06-20 NOTE — Assessment & Plan Note (Addendum)
1.  Stage III (PT4BPN1A) transverse colon adenocarcinoma: - Colonoscopy on 12/07/2018 showing fungating polypoid ulcerated nonobstructing large mass found at the splenic flexure, biopsy consistent with invasive poorly differentiated carcinoma. - Laparoscopic right colectomy on 01/09/2019, pathology showing 4 cm moderately differentiated invasive adenocarcinoma, grade 2, 1/25 lymph nodes positive, no tumor deposits, no perineural invasion.  Tumor extends to serosa into the adjacent omentum.  Margins are negative. - MMR protein IHC shows loss of nuclear expression of MLH1.  MSI was high by PCR. BRAF Testing was negative.  MLH1 hyper methylation was present.  Even though there have been a few reported cases of germline hyper methylation of the CPG islands on the MLH1 promoter methylation, presence of such hyper methylation has been documented and approximately 20% of sporadic colorectal cancers, which correlated with loss of MLH1 expression and high MSI.  The ostium was exhibiting IHC loss of MLH1 and presence of MLH1 promoter methylation are presumed more likely to be a sporadic cancer. - 7 cycles of adjuvant FOLFOX from 03/01/2019 through 06/07/2019. - Her oxaliplatin infusion time was increased to 4 hours which helped with symptoms including cramping of her hands and dysarthria. - She had some sensitivity of fingertips lasting about 4 -5 days.  Had nausea which lasted about 6 days without vomiting.  Fatigue lasted longer after last cycle. - We reviewed her blood work.  Platelet count is just adequate to proceed with her next cycle.  Because of her T4 disease, she was recommended for 6 months of chemotherapy.  I did not want to dose reduce oxaliplatin further at this time.  We will consider it at next cycle if symptoms worsen. - I will see her back in 2 weeks for follow-up.  2.  Vitamin D deficiency: - Vitamin D was 14.8 on 05/23/2019. -We will start her on high-dose vitamin D.

## 2019-06-20 NOTE — Progress Notes (Signed)
Labs reviewed with MD, proceed today per MD. Platelets 76,000 noted.   Treatment given per orders. Patient tolerated it well without problems. Vitals stable and discharged home from clinic ambulatory. Follow up as scheduled.

## 2019-06-20 NOTE — Progress Notes (Signed)
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:  CURRENT THERAPY: FOLFOX  BRIEF ONCOLOGIC HISTORY:  Oncology History  Colon cancer ([place])  01/09/2019 Initial Diagnosis   Colon cancer ([place])   03/01/2019 -  Chemotherapy   The patient had palonosetron (ALOXI) injection 0.25 mg, 0.25 mg, Intravenous,  Once, 8 of 12 cycles Administration: 0.25 mg (03/01/2019), 0.25 mg (03/15/2019), 0.25 mg (03/27/2019), 0.25 mg (04/10/2019), 0.25 mg (05/09/2019), 0.25 mg (05/23/2019), 0.25 mg (06/06/2019), 0.25 mg (06/20/2019) pegfilgrastim (NEULASTA) injection 6 mg, 6 mg, Subcutaneous, Once, 4 of 8 cycles Administration: 6 mg (05/11/2019), 6 mg (05/25/2019), 6 mg (06/08/2019) leucovorin 600 mg in dextrose 5 % 250 mL infusion, 640 mg, Intravenous,  Once, 8 of 12 cycles Administration: 600 mg (03/01/2019), 600 mg (03/15/2019), 640 mg (03/27/2019), 640 mg (04/10/2019), 640 mg (05/09/2019), 640 mg (05/23/2019), 640 mg (06/06/2019), 640 mg (06/20/2019) oxaliplatin (ELOXATIN) 135 mg in dextrose 5 % 500 mL chemo infusion, 85 mg/m2 = 135 mg, Intravenous,  Once, 8 of 12 cycles Dose modification: 68 mg/m2 (80 % of original dose 85 mg/m2, Cycle 5, Reason: Provider Judgment), 68 mg/m2 (original dose 85 mg/m2, Cycle 6, Reason: Provider Judgment) Administration: 135 mg (03/01/2019), 135 mg (03/15/2019), 135 mg (03/27/2019), 135 mg (04/10/2019), 110 mg (05/09/2019), 110 mg (05/23/2019), 110 mg (06/06/2019), 110 mg (06/20/2019) fluorouracil (ADRUCIL) chemo injection 650 mg, 400 mg/m2 = 650 mg, Intravenous,  Once, 8 of 12 cycles Administration: 650 mg (03/01/2019), 650 mg (03/15/2019), 650 mg (03/27/2019), 650 mg (04/10/2019), 650 mg (05/09/2019), 650 mg (05/23/2019), 650 mg (06/06/2019), 650 mg (06/20/2019) fluorouracil  (ADRUCIL) 3,850 mg in sodium chloride 0.9 % 73 mL chemo infusion, 2,400 mg/m2 = 3,850 mg, Intravenous, 1 Day/Dose, 8 of 12 cycles Administration: 3,850 mg (03/01/2019), 3,850 mg (03/15/2019), 3,850 mg (03/27/2019), 3,850 mg (04/10/2019), 3,850 mg (05/09/2019), 3,850 mg (05/23/2019), 3,850 mg (06/06/2019), 3,850 mg (06/20/2019)  for chemotherapy treatment.        INTERVAL HISTORY:  Andrea Simon 63 y.o. female here for follow-up and next cycle of chemotherapy.  Cycle 7 was on 06/06/2019.  She has noticed nausea lasting about 6 days without vomiting.  Fatigue lasted longer than usual.  Appetite and energy levels are 50%.  She has noticed some soreness in the fingertips and toes which lasted 4 to 5 days.  No constant numbness was reported.  Denies any fevers or night sweats.  Eating has been steady.  REVIEW OF SYSTEMS:  Review of Systems  Constitutional: Positive for fatigue.  HENT:  Negative.   Eyes: Negative.   Respiratory: Negative.   Cardiovascular: Negative.   Gastrointestinal: Positive for nausea.  Endocrine: Negative.   Genitourinary: Negative.    Musculoskeletal: Negative.   Skin: Negative.   Neurological: Negative.   Hematological: Negative.   Psychiatric/Behavioral: Negative.   All other systems reviewed and are negative.    PAST MEDICAL/SURGICAL HISTORY:  Past Medical History:  Diagnosis Date  . Hyperlipidemia   . Multinodular goiter   . Prediabetes    Past Surgical History:  Procedure Laterality Date  . BIOPSY  12/07/2018   Procedure: BIOPSY;  Surgeon: Rogene Houston, MD;  [facility];  Service: Endoscopy;;  sigmoid colon  . COLON RESECTION N/A 01/09/2019   Procedure: LAPAROSCOPIC RIGHT HEMICOLECTOMY;  Surgeon:  Virl Cagey, MD;  Location: [facility];  Service: General;  Laterality: N/A;  . COLONOSCOPY N/A 12/07/2018   Procedure: COLONOSCOPY;  Surgeon: Rogene Houston, MD;  Location: [facility];  Service: Endoscopy;  Laterality: N/A;  12:45  . DILATION AND  CURETTAGE OF UTERUS  2006  . FNA thyroid  2011 and 2019   benign  . POLYPECTOMY  12/07/2018   Procedure: POLYPECTOMY;  Surgeon: Rogene Houston, MD;  Location: [facility];  Service: Endoscopy;;  splenic flexure (CS x1)  . PORTACATH PLACEMENT Left 02/15/2019   Procedure: INSERTION PORT-A-CATH;  Surgeon: Virl Cagey, MD;  Location: [facility];  Service: General;  Laterality: Left;  . TUBAL LIGATION  1992     SOCIAL HISTORY:  Social History   Socioeconomic History  . Marital status: Married    Spouse name: Not on file  . Number of children: 1  . Years of education: Not on file  . Highest education level: Not on file  Occupational History  . Occupation: [employer]  . Financial resource strain: Not on file  . Food insecurity    Worry: Not on file    Inability: Not on file  . Transportation needs    Medical: Not on file    Non-medical: Not on file  Tobacco Use  . Smoking status: Current Every Day Smoker    Packs/day: 0.75    Years: 45.00    Pack years: 33.75    Types: Cigarettes  . Smokeless tobacco: Never Used  Substance and Sexual Activity  . Alcohol use: Yes    Alcohol/week: 0.0 standard drinks    Comment: occasionally  . Drug use: No  . Sexual activity: Yes  Lifestyle  . Physical activity    Days per week: Not on file    Minutes per session: Not on file  . Stress: Not on file  Relationships  . Social Herbalist on phone: Not on file    Gets together: Not on file    Attends religious service: Not on file    Active member of club or organization: Not on file    Attends meetings of clubs or organizations: Not on file    Relationship status: Not on file  . Intimate partner violence    Fear of current or ex partner: Not on file    Emotionally abused: Not on file    Physically abused: Not on file    Forced sexual activity: Not on file  Other Topics Concern  . Not on file  Social History Narrative  . Not on file     FAMILY HISTORY:  Family History  Problem Relation Age of Onset  . Hypertension Mother        AAA  . Coronary artery disease Mother   . Hyperlipidemia Mother   . Cancer Mother        lung  . Hypertension Father   . Cancer Brother     CURRENT MEDICATIONS:  Outpatient Encounter Medications as of 06/20/2019  Medication Sig Note  . acetaminophen (TYLENOL) 500 MG tablet Take 1,000 mg by mouth every 6 (six) hours as needed for moderate pain.    Marland Kitchen ALPRAZolam (XANAX) 0.25 MG tablet Take one tablet at bedtime, as needed , for anxiety   . aspirin EC 81 MG tablet Take 1 tablet (81 mg total) by mouth every evening.   . Calcium Carbonate-Vitamin D (CALCIUM 600+D) 600-400 MG-UNIT tablet  Take 1 tablet by mouth 2 (two) times a week. Sunday and Wednesday evenings   . docusate sodium (COLACE) 100 MG capsule Take 1 capsule (100 mg total) by mouth 2 (two) times daily.   . fluorouracil CALGB 54098 in sodium chloride 0.9 % 150 mL Inject into the vein 2 days. Every 14 days 02/02/2019: Has not started yet  . LEUCOVORIN CALCIUM IV Inject into the vein every 14 (fourteen) days. 02/02/2019: Has not started yet  . lidocaine-prilocaine (EMLA) cream Apply to port a cath site one hour prior to chemotherapy appointment to numb the skin over your port site. 02/02/2019: Has not started yet  . ondansetron (ZOFRAN-ODT) 4 MG disintegrating tablet Take 1 tablet (4 mg total) by mouth every 6 (six) hours as needed for nausea.   . OXALIPLATIN IV Inject into the vein every 14 (fourteen) days. 02/02/2019: Has not started yet  . pravastatin (PRAVACHOL) 40 MG tablet TAKE 1 TABLET BY MOUTH  DAILY IN THE EVENING (Patient taking differently: Take 40 mg by mouth 4 (four) times a week. )    Facility-Administered Encounter Medications as of 06/20/2019  Medication  . sodium chloride flush (NS) 0.9 % injection 10 mL    ALLERGIES:  No Known Allergies   PHYSICAL EXAM:  ECOG Performance status: 1  Vitals:   06/20/19 0800  BP: 115/65   Pulse: 82  Resp: 16  Temp: (!) 97.1 F (36.2 C)  SpO2: 100%   Filed Weights   06/20/19 0800  Weight: 123 lb 9.6 oz (56.1 kg)    Physical Exam Constitutional:      Appearance: Normal appearance.  Neck:     Musculoskeletal: Normal range of motion.  Cardiovascular:     Rate and Rhythm: Normal rate and regular rhythm.     Pulses: Normal pulses.     Heart sounds: Normal heart sounds.  Pulmonary:     Effort: Pulmonary effort is normal.     Breath sounds: Normal breath sounds.  Abdominal:     General: There is no distension.     Palpations: Abdomen is soft. There is no mass.  Musculoskeletal: Normal range of motion.  Skin:    General: Skin is warm and dry.  Neurological:     General: No focal deficit present.     Mental Status: She is alert and oriented to person, place, and time.  Psychiatric:        Mood and Affect: Mood normal.        Behavior: Behavior normal.      LABORATORY DATA:  I have reviewed the labs as listed.  CBC    Component Value Date/Time   WBC 4.3 06/20/2019 0802   RBC 3.68 (L) 06/20/2019 0802   HGB 11.3 (L) 06/20/2019 0802   HGB 13.5 08/18/2018 0758   HCT 35.0 (L) 06/20/2019 0802   HCT 37.2 08/18/2018 0758   PLT 76 (L) 06/20/2019 0802   PLT 259 08/18/2018 0758   MCV 95.1 06/20/2019 0802   MCV 104 (H) 08/18/2018 0758   MCH 30.7 06/20/2019 0802   MCHC 32.3 06/20/2019 0802   RDW 17.1 (H) 06/20/2019 0802   RDW 13.0 08/18/2018 0758   LYMPHSABS 1.3 06/20/2019 0802   LYMPHSABS 3.5 (H) 08/31/2017 0955   MONOABS 0.4 06/20/2019 0802   EOSABS 0.0 06/20/2019 0802   EOSABS 0.1 08/31/2017 0955   BASOSABS 0.0 06/20/2019 0802   BASOSABS 0.0 08/31/2017 0955   CMP Latest Ref Rng & Units 06/20/2019 06/06/2019 05/23/2019  Glucose 70 -  99 mg/dL 132(H) 107(H) 106(H)  BUN 8 - 23 mg/dL '9 11 12  ' Creatinine 0.44 - 1.00 mg/dL 0.67 0.64 0.69  Sodium 135 - 145 mmol/L 139 139 140  Potassium 3.5 - 5.1 mmol/L 3.5 3.9 3.8  Chloride 98 - 111 mmol/L 106 104 107  CO2 22  - 32 mmol/L '25 24 22  ' Calcium 8.9 - 10.3 mg/dL 9.4 9.6 9.8  Total Protein 6.5 - 8.1 g/dL 7.0 7.1 7.3  Total Bilirubin 0.3 - 1.2 mg/dL 0.8 0.5 0.8  Alkaline Phos 38 - 126 U/L 115 107 97  AST 15 - 41 U/L '29 29 30  ' ALT 0 - 44 U/L '20 21 21   ' I have independently reviewed her scans.    ASSESSMENT & PLAN:   Colon cancer ([place]) 1.  Stage III (PT4BPN1A) transverse colon adenocarcinoma: - Colonoscopy on 12/07/2018 showing fungating polypoid ulcerated nonobstructing large mass found at the splenic flexure, biopsy consistent with invasive poorly differentiated carcinoma. - Laparoscopic right colectomy on 01/09/2019, pathology showing 4 cm moderately differentiated invasive adenocarcinoma, grade 2, 1/25 lymph nodes positive, no tumor deposits, no perineural invasion.  Tumor extends to serosa into the adjacent omentum.  Margins are negative. - MMR protein IHC shows loss of nuclear expression of MLH1.  MSI was high by PCR. BRAF Testing was negative.  MLH1 hyper methylation was present.  Even though there have been a few reported cases of germline hyper methylation of the CPG islands on the MLH1 promoter methylation, presence of such hyper methylation has been documented and approximately 20% of sporadic colorectal cancers, which correlated with loss of MLH1 expression and high MSI.  The ostium was exhibiting IHC loss of MLH1 and presence of MLH1 promoter methylation are presumed more likely to be a sporadic cancer. - 7 cycles of adjuvant FOLFOX from 03/01/2019 through 06/07/2019. - Her oxaliplatin infusion time was increased to 4 hours which helped with symptoms including cramping of her hands and dysarthria. - She had some sensitivity of fingertips lasting about 4 -5 days.  Had nausea which lasted about 6 days without vomiting.  Fatigue lasted longer after last cycle. - We reviewed her blood work.  Platelet count is just adequate to proceed with her next cycle.  Because of her T4 disease, she was recommended  for 6 months of chemotherapy.  I did not want to dose reduce oxaliplatin further at this time.  We will consider it at next cycle if symptoms worsen. - I will see her back in 2 weeks for follow-up.  2.  Vitamin D deficiency: - Vitamin D was 14.8 on 05/23/2019. -We will start her on high-dose vitamin D.   Total time spent is 25 minutes with more than 50% of the time spent face-to-face discussing lab results, treatment plan and coordination of care.   Derek Jack, MD  [place] [phone]

## 2019-06-22 ENCOUNTER — Encounter (HOSPITAL_COMMUNITY): Payer: Self-pay

## 2019-06-22 ENCOUNTER — Inpatient Hospital Stay (HOSPITAL_COMMUNITY): Payer: 59 | Attending: Hematology

## 2019-06-22 ENCOUNTER — Other Ambulatory Visit: Payer: Self-pay

## 2019-06-22 VITALS — BP 112/70 | HR 80 | Temp 97.7°F | Resp 16

## 2019-06-22 DIAGNOSIS — Z5111 Encounter for antineoplastic chemotherapy: Secondary | ICD-10-CM | POA: Insufficient documentation

## 2019-06-22 DIAGNOSIS — Z7982 Long term (current) use of aspirin: Secondary | ICD-10-CM | POA: Insufficient documentation

## 2019-06-22 DIAGNOSIS — F17210 Nicotine dependence, cigarettes, uncomplicated: Secondary | ICD-10-CM | POA: Insufficient documentation

## 2019-06-22 DIAGNOSIS — R200 Anesthesia of skin: Secondary | ICD-10-CM | POA: Diagnosis not present

## 2019-06-22 DIAGNOSIS — Z79899 Other long term (current) drug therapy: Secondary | ICD-10-CM | POA: Diagnosis not present

## 2019-06-22 DIAGNOSIS — E559 Vitamin D deficiency, unspecified: Secondary | ICD-10-CM | POA: Insufficient documentation

## 2019-06-22 DIAGNOSIS — G479 Sleep disorder, unspecified: Secondary | ICD-10-CM | POA: Insufficient documentation

## 2019-06-22 DIAGNOSIS — E785 Hyperlipidemia, unspecified: Secondary | ICD-10-CM | POA: Insufficient documentation

## 2019-06-22 DIAGNOSIS — R002 Palpitations: Secondary | ICD-10-CM | POA: Diagnosis not present

## 2019-06-22 DIAGNOSIS — R05 Cough: Secondary | ICD-10-CM | POA: Diagnosis not present

## 2019-06-22 DIAGNOSIS — Z452 Encounter for adjustment and management of vascular access device: Secondary | ICD-10-CM | POA: Diagnosis present

## 2019-06-22 DIAGNOSIS — C184 Malignant neoplasm of transverse colon: Secondary | ICD-10-CM | POA: Insufficient documentation

## 2019-06-22 DIAGNOSIS — Z5189 Encounter for other specified aftercare: Secondary | ICD-10-CM | POA: Diagnosis not present

## 2019-06-22 MED ORDER — SODIUM CHLORIDE 0.9% FLUSH
10.0000 mL | INTRAVENOUS | Status: DC | PRN
  Administered 2019-06-22: 10 mL
  Filled 2019-06-22: qty 10

## 2019-06-22 MED ORDER — PEGFILGRASTIM INJECTION 6 MG/0.6ML ~~LOC~~
6.0000 mg | PREFILLED_SYRINGE | Freq: Once | SUBCUTANEOUS | Status: AC
  Administered 2019-06-22: 6 mg via SUBCUTANEOUS
  Filled 2019-06-22: qty 0.6

## 2019-06-22 MED ORDER — HEPARIN SOD (PORK) LOCK FLUSH 100 UNIT/ML IV SOLN
500.0000 [IU] | Freq: Once | INTRAVENOUS | Status: AC | PRN
  Administered 2019-06-22: 15:00:00 500 [IU]

## 2019-06-22 NOTE — Patient Instructions (Signed)
[place] at [facility] Discharge Instructions  5FU pump discontinued with portacath flushed per protocol. Follow-up as scheduled. Call clinic for any questions or concerns   Thank you for choosing [place] at [facility] to provide your oncology and hematology care.  To afford each patient quality time with our provider, please arrive at least 15 minutes before your scheduled appointment time.   If you have a lab appointment with the [facility] please come in thru the  Main Entrance and check in at the main information desk  You need to re-schedule your appointment should you arrive 10 or more minutes late.  We strive to give you quality time with our providers, and arriving late affects you and other patients whose appointments are after yours.  Also, if you no show three or more times for appointments you may be dismissed from the clinic at the providers discretion.     Again, thank you for choosing [facility].  Our hope is that these requests will decrease the amount of time that you wait before being seen by our physicians.       _____________________________________________________________  Should you have questions after your visit to [facility], please contact our office at [phone] between the hours of 8:00 a.m. and 4:30 p.m.  Voicemails left after 4:00 p.m. will not be returned until the following business day.  For prescription refill requests, have your pharmacy contact our office and allow 72 hours.    Cancer Center Support Programs:   > Cancer Support Group  2nd Tuesday of the month 1pm-2pm, Journey Room

## 2019-06-22 NOTE — Progress Notes (Signed)
Andrea Simon tolerated Neulasta injection and 5FU pump well without complaints or incident. 5FU pump discontinued with portacath flushed easily per protocol. VSS. Pt discharged self ambulatory in satisfactory condition

## 2019-07-04 ENCOUNTER — Inpatient Hospital Stay (HOSPITAL_COMMUNITY): Payer: 59

## 2019-07-04 ENCOUNTER — Inpatient Hospital Stay (HOSPITAL_BASED_OUTPATIENT_CLINIC_OR_DEPARTMENT_OTHER): Payer: 59 | Admitting: Hematology

## 2019-07-04 ENCOUNTER — Other Ambulatory Visit: Payer: Self-pay

## 2019-07-04 ENCOUNTER — Encounter (HOSPITAL_COMMUNITY): Payer: Self-pay | Admitting: Hematology

## 2019-07-04 DIAGNOSIS — E559 Vitamin D deficiency, unspecified: Secondary | ICD-10-CM

## 2019-07-04 DIAGNOSIS — Z79899 Other long term (current) drug therapy: Secondary | ICD-10-CM

## 2019-07-04 DIAGNOSIS — F17210 Nicotine dependence, cigarettes, uncomplicated: Secondary | ICD-10-CM | POA: Diagnosis not present

## 2019-07-04 DIAGNOSIS — C184 Malignant neoplasm of transverse colon: Secondary | ICD-10-CM

## 2019-07-04 DIAGNOSIS — Z7982 Long term (current) use of aspirin: Secondary | ICD-10-CM

## 2019-07-04 DIAGNOSIS — E785 Hyperlipidemia, unspecified: Secondary | ICD-10-CM

## 2019-07-04 DIAGNOSIS — Z5189 Encounter for other specified aftercare: Secondary | ICD-10-CM | POA: Diagnosis not present

## 2019-07-04 LAB — COMPREHENSIVE METABOLIC PANEL
ALT: 21 U/L (ref 0–44)
AST: 34 U/L (ref 15–41)
Albumin: 4.1 g/dL (ref 3.5–5.0)
Alkaline Phosphatase: 120 U/L (ref 38–126)
Anion gap: 9 (ref 5–15)
BUN: 10 mg/dL (ref 8–23)
CO2: 23 mmol/L (ref 22–32)
Calcium: 9.5 mg/dL (ref 8.9–10.3)
Chloride: 108 mmol/L (ref 98–111)
Creatinine, Ser: 0.69 mg/dL (ref 0.44–1.00)
GFR calc Af Amer: >60 mL/min (ref >60)
GFR calc non Af Amer: >60 mL/min (ref >60)
Glucose, Bld: 141 mg/dL — ABNORMAL HIGH (ref 70–99)
Potassium: 3.6 mmol/L (ref 3.5–5.1)
Sodium: 140 mmol/L (ref 135–145)
Total Bilirubin: 1 mg/dL (ref 0.3–1.2)
Total Protein: 7 g/dL (ref 6.5–8.1)

## 2019-07-04 LAB — CBC WITH DIFFERENTIAL/PLATELET
Abs Immature Granulocytes: 0.02 10*3/uL (ref 0.00–0.07)
Basophils Absolute: 0 10*3/uL (ref 0.0–0.1)
Basophils Relative: 1 %
Eosinophils Absolute: 0 10*3/uL (ref 0.0–0.5)
Eosinophils Relative: 1 %
HCT: 36.2 % (ref 36.0–46.0)
Hemoglobin: 11.5 g/dL — ABNORMAL LOW (ref 12.0–15.0)
Immature Granulocytes: 1 %
Lymphocytes Relative: 41 %
Lymphs Abs: 1.6 10*3/uL (ref 0.7–4.0)
MCH: 30.3 pg (ref 26.0–34.0)
MCHC: 31.8 g/dL (ref 30.0–36.0)
MCV: 95.3 fL (ref 80.0–100.0)
Monocytes Absolute: 0.3 10*3/uL (ref 0.1–1.0)
Monocytes Relative: 8 %
Neutro Abs: 1.8 10*3/uL (ref 1.7–7.7)
Neutrophils Relative %: 48 %
Platelets: 70 10*3/uL — ABNORMAL LOW (ref 150–400)
RBC: 3.8 MIL/uL — ABNORMAL LOW (ref 3.87–5.11)
RDW: 17.6 % — ABNORMAL HIGH (ref 11.5–15.5)
WBC: 3.8 10*3/uL — ABNORMAL LOW (ref 4.0–10.5)
nRBC: 0 % (ref 0.0–0.2)

## 2019-07-04 MED ORDER — HEPARIN SOD (PORK) LOCK FLUSH 100 UNIT/ML IV SOLN
500.0000 [IU] | Freq: Once | INTRAVENOUS | Status: AC
  Administered 2019-07-04: 500 [IU] via INTRAVENOUS

## 2019-07-04 MED ORDER — SODIUM CHLORIDE 0.9% FLUSH
10.0000 mL | Freq: Once | INTRAVENOUS | Status: AC
  Administered 2019-07-04: 10 mL via INTRAVENOUS

## 2019-07-04 NOTE — Patient Instructions (Addendum)
[facility] at [facility] Discharge Instructions  You were seen today by Dr. Katragadda. He went over your recent lab results. He will see you back in 1 weeks for labs and follow up.   Thank you for choosing [facility] at [facility] to provide your oncology and hematology care.  To afford each patient quality time with our provider, please arrive at least 15 minutes before your scheduled appointment time.   If you have a lab appointment with the Cancer Center please come in thru the  Main Entrance and check in at the main information desk  You need to re-schedule your appointment should you arrive 10 or more minutes late.  We strive to give you quality time with our providers, and arriving late affects you and other patients whose appointments are after yours.  Also, if you no show three or more times for appointments you may be dismissed from the clinic at the providers discretion.     Again, thank you for choosing [facility].  Our hope is that these requests will decrease the amount of time that you wait before being seen by our physicians.       _____________________________________________________________  Should you have questions after your visit to [facility], please contact our office at [phone] between the hours of 8:00 a.m. and 4:30 p.m.  Voicemails left after 4:00 p.m. will not be returned until the following business day.  For prescription refill requests, have your pharmacy contact our office and allow 72 hours.    Cancer Center Support Programs:   > Cancer Support Group  2nd Tuesday of the month 1pm-2pm, Journey Room    

## 2019-07-04 NOTE — Assessment & Plan Note (Addendum)
1.  Stage III (PT4BPN1A) transverse colon adenocarcinoma: - Laparoscopic right colectomy on 01/09/2019, pathology showing 4 cm moderately differentiated invasive adenocarcinoma, grade 2, 1/25 lymph nodes positive, no tumor deposits, no perineural invasion.  Tumor extends through serosa into adherent omentum.  Margins negative. - PET scan on 01/31/2019 shows focal hypermetabolic activity identified in the ileocolic anastomosis with SUV of 9.  This could be related to postsurgical changes but follow-up as needed. - 8 cycles of FOLFOX from 03/01/2019 through 06/20/2019 (oxaliplatin dose reduced to 68 mg per metered square starting cycle 5 secondary to laryngopharyngeal dysesthesia). - Denies any tingling or numbness but has cold sensitivity which is more prominent in the first week followed by improvement in the second week. -We have reviewed her blood work.  Her platelet count today 70.  I would hold her treatment today until platelet count improves to more than 75.  We will check back in 1 week.  2.  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  [place] was high by PCR.  BRAF testing was negative. - MLH1 hyper methylation was present.  This was thought to be a sporadic cancer.  3.  Vitamin D deficiency: - Vitamin D level on 05/23/2019 was 14.8. -We have started her on vitamin D 50,000 units weekly.

## 2019-07-04 NOTE — Progress Notes (Signed)
Platelets 70 today.  Holding treatment today and to return next week verbal order Dr. Delton Coombes.   Patients port flushed without difficulty.  Good blood return noted before and after flush. No complaints of pain with flush and no bruising or swelling noted at site.  Band aid applied.  VSS with discharge and left ambulatory with no s/s of distress noted.

## 2019-07-04 NOTE — Progress Notes (Signed)
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:  CURRENT THERAPY: FOLFOX  BRIEF ONCOLOGIC HISTORY:  Oncology History  Colon cancer ([place])  01/09/2019 Initial Diagnosis   Colon cancer ([place])   03/01/2019 -  Chemotherapy   The patient had palonosetron (ALOXI) injection 0.25 mg, 0.25 mg, Intravenous,  Once, 8 of 12 cycles Administration: 0.25 mg (03/01/2019), 0.25 mg (03/15/2019), 0.25 mg (03/27/2019), 0.25 mg (04/10/2019), 0.25 mg (05/09/2019), 0.25 mg (05/23/2019), 0.25 mg (06/06/2019), 0.25 mg (06/20/2019) pegfilgrastim (NEULASTA) injection 6 mg, 6 mg, Subcutaneous, Once, 4 of 8 cycles Administration: 6 mg (05/11/2019), 6 mg (05/25/2019), 6 mg (06/08/2019), 6 mg (06/22/2019) leucovorin 600 mg in dextrose 5 % 250 mL infusion, 640 mg, Intravenous,  Once, 8 of 12 cycles Administration: 600 mg (03/01/2019), 600 mg (03/15/2019), 640 mg (03/27/2019), 640 mg (04/10/2019), 640 mg (05/09/2019), 640 mg (05/23/2019), 640 mg (06/06/2019), 640 mg (06/20/2019) oxaliplatin (ELOXATIN) 135 mg in dextrose 5 % 500 mL chemo infusion, 85 mg/m2 = 135 mg, Intravenous,  Once, 8 of 12 cycles Dose modification: 68 mg/m2 (80 % of original dose 85 mg/m2, Cycle 5, Reason: Provider Judgment), 68 mg/m2 (original dose 85 mg/m2, Cycle 6, Reason: Provider Judgment) Administration: 135 mg (03/01/2019), 135 mg (03/15/2019), 135 mg (03/27/2019), 135 mg (04/10/2019), 110 mg (05/09/2019), 110 mg (05/23/2019), 110 mg (06/06/2019), 110 mg (06/20/2019) fluorouracil (ADRUCIL) chemo injection 650 mg, 400 mg/m2 = 650 mg, Intravenous,  Once, 8 of 12 cycles Administration: 650 mg (03/01/2019), 650 mg (03/15/2019), 650 mg (03/27/2019), 650 mg (04/10/2019), 650 mg (05/09/2019), 650 mg (05/23/2019), 650 mg (06/06/2019), 650 mg (06/20/2019)  fluorouracil (ADRUCIL) 3,850 mg in sodium chloride 0.9 % 73 mL chemo infusion, 2,400 mg/m2 = 3,850 mg, Intravenous, 1 Day/Dose, 8 of 12 cycles Administration: 3,850 mg (03/01/2019), 3,850 mg (03/15/2019), 3,850 mg (03/27/2019), 3,850 mg (04/10/2019), 3,850 mg (05/09/2019), 3,850 mg (05/23/2019), 3,850 mg (06/06/2019), 3,850 mg (06/20/2019)  for chemotherapy treatment.        INTERVAL HISTORY:  Andrea Simon 63 y.o. female seen for follow-up of colon cancer.  She received cycle 8 on 06/20/2027.  Denies any GI side effects including nausea vomiting diarrhea or constipation.  Denies any fevers or infections.  Denies any tingling or numbness in the extremities.  She does have cold sensitivity lasting more intensely in the first week followed by improvement in the second week.  She still has some degree of cold sensitivity in the second week.  Also reported some sinus discharge.  Appetite and energy levels are 75%.  REVIEW OF SYSTEMS:  Review of Systems  HENT:  Negative.   Eyes: Negative.   Respiratory: Negative.   Cardiovascular: Negative.   Endocrine: Negative.   Genitourinary: Negative.    Musculoskeletal: Negative.   Skin: Negative.   Neurological: Positive for numbness.  Hematological: Negative.   Psychiatric/Behavioral: Negative.   All other systems reviewed and are negative.    PAST MEDICAL/SURGICAL HISTORY:  Past Medical History:  Diagnosis Date  . Hyperlipidemia   . Multinodular goiter   . Prediabetes    Past Surgical History:  Procedure Laterality Date  . BIOPSY  12/07/2018   Procedure: BIOPSY;  Surgeon: Andrea Houston, MD;  [facility];  Service: Endoscopy;;  sigmoid colon  . COLON RESECTION N/A 01/09/2019  Procedure: LAPAROSCOPIC RIGHT HEMICOLECTOMY;  Surgeon: Virl Cagey, MD;  Location: [facility] OR[facility];  Service: General;  Laterality: N/A;  . COLONOSCOPY N/A 12/07/2018   Procedure: COLONOSCOPY;  Surgeon: Andrea Houston, MD;  Location: [facility];  Service:  Endoscopy;  Laterality: N/A;  12:45  . DILATION AND CURETTAGE OF UTERUS  2006  . FNA thyroid  2011 and 2019   benign  . POLYPECTOMY  12/07/2018   Procedure: POLYPECTOMY;  Surgeon: Andrea Houston, MD;  Location: [facility];  Service: Endoscopy;;  splenic flexure (CS x1)  . PORTACATH PLACEMENT Left 02/15/2019   Procedure: INSERTION PORT-A-CATH;  Surgeon: Virl Cagey, MD;  Location: [facility];  Service: General;  Laterality: Left;  . TUBAL LIGATION  1992     SOCIAL HISTORY:  Social History   Socioeconomic History  . Marital status: Married    Spouse name: Not on file  . Number of children: 1  . Years of education: Not on file  . Highest education level: Not on file  Occupational History  . Occupation: [employer]  . Financial resource strain: Not on file  . Food insecurity    Worry: Not on file    Inability: Not on file  . Transportation needs    Medical: Not on file    Non-medical: Not on file  Tobacco Use  . Smoking status: Current Every Day Smoker    Packs/day: 0.75    Years: 45.00    Pack years: 33.75    Types: Cigarettes  . Smokeless tobacco: Never Used  Substance and Sexual Activity  . Alcohol use: Yes    Alcohol/week: 0.0 standard drinks    Comment: occasionally  . Drug use: No  . Sexual activity: Yes  Lifestyle  . Physical activity    Days per week: Not on file    Minutes per session: Not on file  . Stress: Not on file  Relationships  . Social Herbalist on phone: Not on file    Gets together: Not on file    Attends religious service: Not on file    Active member of club or organization: Not on file    Attends meetings of clubs or organizations: Not on file    Relationship status: Not on file  . Intimate partner violence    Fear of current or ex partner: Not on file    Emotionally abused: Not on file    Physically abused: Not on file    Forced sexual activity: Not on file  Other Topics Concern  . Not  on file  Social History Narrative  . Not on file    FAMILY HISTORY:  Family History  Problem Relation Age of Onset  . Hypertension Mother        AAA  . Coronary artery disease Mother   . Hyperlipidemia Mother   . Cancer Mother        lung  . Hypertension Father   . Cancer Brother     CURRENT MEDICATIONS:  Outpatient Encounter Medications as of 07/04/2019  Medication Sig Note  . acetaminophen (TYLENOL) 500 MG tablet Take 1,000 mg by mouth every 6 (six) hours as needed for moderate pain.    Marland Kitchen ALPRAZolam (XANAX) 0.25 MG tablet Take one tablet at bedtime, as needed , for anxiety   . aspirin EC 81 MG tablet Take 1 tablet (81 mg total) by mouth every evening.   . Calcium Carbonate-Vitamin  D (CALCIUM 600+D) 600-400 MG-UNIT tablet Take 1 tablet by mouth 2 (two) times a week. Sunday and Wednesday evenings   . docusate sodium (COLACE) 100 MG capsule Take 1 capsule (100 mg total) by mouth 2 (two) times daily.   . ergocalciferol (VITAMIN D2) 1.25 MG (50000 UT) capsule Take 1 capsule (50,000 Units total) by mouth once a week.   . fluorouracil CALGB 67124 in sodium chloride 0.9 % 150 mL Inject into the vein 2 days. Every 14 days 02/02/2019: Has not started yet  . LEUCOVORIN CALCIUM IV Inject into the vein every 14 (fourteen) days. 02/02/2019: Has not started yet  . lidocaine-prilocaine (EMLA) cream Apply to port a cath site one hour prior to chemotherapy appointment to numb the skin over your port site. 02/02/2019: Has not started yet  . ondansetron (ZOFRAN-ODT) 4 MG disintegrating tablet Take 1 tablet (4 mg total) by mouth every 6 (six) hours as needed for nausea.   . OXALIPLATIN IV Inject into the vein every 14 (fourteen) days. 02/02/2019: Has not started yet  . pravastatin (PRAVACHOL) 40 MG tablet TAKE 1 TABLET BY MOUTH  DAILY IN THE EVENING (Patient taking differently: Take 40 mg by mouth 4 (four) times a week. )    Facility-Administered Encounter Medications as of 07/04/2019  Medication  .  sodium chloride flush (NS) 0.9 % injection 10 mL    ALLERGIES:  No Known Allergies   PHYSICAL EXAM:  ECOG Performance status: 1  Vitals:   07/04/19 0755  BP: 130/79  Pulse: 92  Resp: 18  Temp: 98.2 F (36.8 C)  SpO2: 100%   Filed Weights   07/04/19 0755  Weight: 122 lb 3.2 oz (55.4 kg)    Physical Exam Constitutional:      Appearance: Normal appearance.  Neck:     Musculoskeletal: Normal range of motion.  Cardiovascular:     Rate and Rhythm: Normal rate and regular rhythm.     Pulses: Normal pulses.     Heart sounds: Normal heart sounds.  Pulmonary:     Effort: Pulmonary effort is normal.     Breath sounds: Normal breath sounds.  Abdominal:     General: There is no distension.     Palpations: Abdomen is soft. There is no mass.  Musculoskeletal: Normal range of motion.  Skin:    General: Skin is warm and dry.  Neurological:     General: No focal deficit present.     Mental Status: She is alert and oriented to person, place, and time.  Psychiatric:        Mood and Affect: Mood normal.        Behavior: Behavior normal.      LABORATORY DATA:  I have reviewed the labs as listed.  CBC    Component Value Date/Time   WBC 3.8 (L) 07/04/2019 0751   RBC 3.80 (L) 07/04/2019 0751   HGB 11.5 (L) 07/04/2019 0751   HGB 13.5 08/18/2018 0758   HCT 36.2 07/04/2019 0751   HCT 37.2 08/18/2018 0758   PLT 70 (L) 07/04/2019 0751   PLT 259 08/18/2018 0758   MCV 95.3 07/04/2019 0751   MCV 104 (H) 08/18/2018 0758   MCH 30.3 07/04/2019 0751   MCHC 31.8 07/04/2019 0751   RDW 17.6 (H) 07/04/2019 0751   RDW 13.0 08/18/2018 0758   LYMPHSABS 1.6 07/04/2019 0751   LYMPHSABS 3.5 (H) 08/31/2017 0955   MONOABS 0.3 07/04/2019 0751   EOSABS 0.0 07/04/2019 0751   EOSABS 0.1 08/31/2017  0955   BASOSABS 0.0 07/04/2019 0751   BASOSABS 0.0 08/31/2017 0955   CMP Latest Ref Rng & Units 07/04/2019 06/20/2019 06/06/2019  Glucose 70 - 99 mg/dL 141(H) 132(H) 107(H)  BUN 8 - 23 mg/dL _0 Creatinine 0.44 - 1.00 mg/dL 0.69 0.67 0.64  Sodium 135 - 145 mmol/L 140 139 139  Potassium 3.5 - 5.1 mmol/L 3.6 3.5 3.9  Chloride 98 - 111 mmol/L 108 106 104  CO2 22 - 32 mmol/L _1 Calcium 8.9 - 10.3 mg/dL 9.5 9.4 9.6  Total Protein 6.5 - 8.1 g/dL 7.0 7.0 7.1  Total Bilirubin 0.3 - 1.2 mg/dL 1.0 0.8 0.5  Alkaline Phos 38 - 126 U/L 120 115 107  AST 15 - 41 U/L 34 29 29  ALT 0 - 44 U/L _2 I have independently reviewed her scans.    ASSESSMENT & PLAN:   Malignant neoplasm of transverse colon ([place]) 1.  Stage III (PT4BPN1A) transverse colon adenocarcinoma: - Laparoscopic right colectomy on 01/09/2019, pathology showing 4 cm moderately differentiated invasive adenocarcinoma, grade 2, 1/25 lymph nodes positive, no tumor deposits, no perineural invasion.  Tumor extends through serosa into adherent omentum.  Margins negative. - PET scan on 01/31/2019 shows focal hypermetabolic activity identified in the ileocolic anastomosis with SUV of 9.  This could be related to postsurgical changes but follow-up as needed. - 8 cycles of FOLFOX from 03/01/2019 through 06/20/2019 (oxaliplatin dose reduced to 68 mg per metered square starting cycle 5 secondary to laryngopharyngeal dysesthesia). - Denies any tingling or numbness but has cold sensitivity which is more prominent in the first week followed by improvement in the second week. -We have reviewed her blood work.  Her platelet count today 70.  I would hold her treatment today until platelet count improves to more than 75.  We will check back in 1 week.  2.  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  [place] was high by PCR.  BRAF testing was negative. - MLH1 hyper methylation was present.  This was thought to be a sporadic cancer.  3.  Vitamin D deficiency: - Vitamin D level on 05/23/2019 was 14.8. -We have started her on vitamin D 50,000 units weekly.   Total time spent is 25 minutes with more than 50% of the time  spent face-to-face discussing lab results, treatment plan and coordination of care.   Derek Jack, MD  [place] [phone]

## 2019-07-06 ENCOUNTER — Encounter (HOSPITAL_COMMUNITY): Payer: 59

## 2019-07-11 ENCOUNTER — Other Ambulatory Visit: Payer: Self-pay

## 2019-07-11 ENCOUNTER — Encounter (HOSPITAL_COMMUNITY): Payer: Self-pay

## 2019-07-11 ENCOUNTER — Inpatient Hospital Stay (HOSPITAL_COMMUNITY): Payer: 59

## 2019-07-11 ENCOUNTER — Encounter (HOSPITAL_COMMUNITY): Payer: Self-pay | Admitting: Hematology

## 2019-07-11 ENCOUNTER — Inpatient Hospital Stay (HOSPITAL_BASED_OUTPATIENT_CLINIC_OR_DEPARTMENT_OTHER): Payer: 59 | Admitting: Hematology

## 2019-07-11 VITALS — BP 125/60 | HR 66 | Temp 96.8°F | Resp 18

## 2019-07-11 DIAGNOSIS — R002 Palpitations: Secondary | ICD-10-CM

## 2019-07-11 DIAGNOSIS — E785 Hyperlipidemia, unspecified: Secondary | ICD-10-CM

## 2019-07-11 DIAGNOSIS — E559 Vitamin D deficiency, unspecified: Secondary | ICD-10-CM

## 2019-07-11 DIAGNOSIS — C184 Malignant neoplasm of transverse colon: Secondary | ICD-10-CM

## 2019-07-11 DIAGNOSIS — Z5189 Encounter for other specified aftercare: Secondary | ICD-10-CM | POA: Diagnosis not present

## 2019-07-11 DIAGNOSIS — G479 Sleep disorder, unspecified: Secondary | ICD-10-CM

## 2019-07-11 DIAGNOSIS — Z7982 Long term (current) use of aspirin: Secondary | ICD-10-CM

## 2019-07-11 DIAGNOSIS — R05 Cough: Secondary | ICD-10-CM | POA: Diagnosis not present

## 2019-07-11 DIAGNOSIS — R200 Anesthesia of skin: Secondary | ICD-10-CM

## 2019-07-11 DIAGNOSIS — F17210 Nicotine dependence, cigarettes, uncomplicated: Secondary | ICD-10-CM

## 2019-07-11 DIAGNOSIS — Z79899 Other long term (current) drug therapy: Secondary | ICD-10-CM

## 2019-07-11 LAB — COMPREHENSIVE METABOLIC PANEL
ALT: 22 U/L (ref 0–44)
AST: 35 U/L (ref 15–41)
Albumin: 4 g/dL (ref 3.5–5.0)
Alkaline Phosphatase: 104 U/L (ref 38–126)
Anion gap: 8 (ref 5–15)
BUN: 7 mg/dL — ABNORMAL LOW (ref 8–23)
CO2: 25 mmol/L (ref 22–32)
Calcium: 9.4 mg/dL (ref 8.9–10.3)
Chloride: 106 mmol/L (ref 98–111)
Creatinine, Ser: 0.59 mg/dL (ref 0.44–1.00)
GFR calc Af Amer: >60 mL/min (ref >60)
GFR calc non Af Amer: >60 mL/min (ref >60)
Glucose, Bld: 122 mg/dL — ABNORMAL HIGH (ref 70–99)
Potassium: 3.5 mmol/L (ref 3.5–5.1)
Sodium: 139 mmol/L (ref 135–145)
Total Bilirubin: 1.2 mg/dL (ref 0.3–1.2)
Total Protein: 7.1 g/dL (ref 6.5–8.1)

## 2019-07-11 LAB — CBC WITH DIFFERENTIAL/PLATELET
Abs Immature Granulocytes: 0.01 10*3/uL (ref 0.00–0.07)
Basophils Absolute: 0 10*3/uL (ref 0.0–0.1)
Basophils Relative: 1 %
Eosinophils Absolute: 0 10*3/uL (ref 0.0–0.5)
Eosinophils Relative: 1 %
HCT: 35.3 % — ABNORMAL LOW (ref 36.0–46.0)
Hemoglobin: 11.3 g/dL — ABNORMAL LOW (ref 12.0–15.0)
Immature Granulocytes: 0 %
Lymphocytes Relative: 36 %
Lymphs Abs: 1.5 10*3/uL (ref 0.7–4.0)
MCH: 30.3 pg (ref 26.0–34.0)
MCHC: 32 g/dL (ref 30.0–36.0)
MCV: 94.6 fL (ref 80.0–100.0)
Monocytes Absolute: 0.4 10*3/uL (ref 0.1–1.0)
Monocytes Relative: 10 %
Neutro Abs: 2.1 10*3/uL (ref 1.7–7.7)
Neutrophils Relative %: 52 %
Platelets: 168 10*3/uL (ref 150–400)
RBC: 3.73 MIL/uL — ABNORMAL LOW (ref 3.87–5.11)
RDW: 18.1 % — ABNORMAL HIGH (ref 11.5–15.5)
WBC: 4 10*3/uL (ref 4.0–10.5)
nRBC: 0 % (ref 0.0–0.2)

## 2019-07-11 MED ORDER — LEUCOVORIN CALCIUM INJECTION 350 MG
400.0000 mg/m2 | Freq: Once | INTRAVENOUS | Status: AC
  Administered 2019-07-11: 640 mg via INTRAVENOUS
  Filled 2019-07-11: qty 32

## 2019-07-11 MED ORDER — SODIUM CHLORIDE 0.9 % IV SOLN
2400.0000 mg/m2 | INTRAVENOUS | Status: DC
  Administered 2019-07-11: 3850 mg via INTRAVENOUS
  Filled 2019-07-11: qty 77

## 2019-07-11 MED ORDER — SODIUM CHLORIDE 0.9 % IV SOLN
10.0000 mg | Freq: Once | INTRAVENOUS | Status: AC
  Administered 2019-07-11: 10 mg via INTRAVENOUS
  Filled 2019-07-11: qty 10

## 2019-07-11 MED ORDER — SODIUM CHLORIDE 0.9% FLUSH
10.0000 mL | INTRAVENOUS | Status: DC | PRN
  Administered 2019-07-11: 10 mL
  Filled 2019-07-11: qty 10

## 2019-07-11 MED ORDER — PALONOSETRON HCL INJECTION 0.25 MG/5ML
0.2500 mg | Freq: Once | INTRAVENOUS | Status: AC
  Administered 2019-07-11: 0.25 mg via INTRAVENOUS
  Filled 2019-07-11: qty 5

## 2019-07-11 MED ORDER — DEXTROSE 5 % IV SOLN
Freq: Once | INTRAVENOUS | Status: AC
  Administered 2019-07-11: 09:00:00 via INTRAVENOUS

## 2019-07-11 MED ORDER — OXALIPLATIN CHEMO INJECTION 100 MG/20ML
68.0000 mg/m2 | Freq: Once | INTRAVENOUS | Status: AC
  Administered 2019-07-11: 110 mg via INTRAVENOUS
  Filled 2019-07-11: qty 2

## 2019-07-11 MED ORDER — FLUOROURACIL CHEMO INJECTION 2.5 GM/50ML
400.0000 mg/m2 | Freq: Once | INTRAVENOUS | Status: AC
  Administered 2019-07-11: 650 mg via INTRAVENOUS
  Filled 2019-07-11: qty 13

## 2019-07-11 NOTE — Patient Instructions (Signed)
[facility] at [facility] Discharge Instructions  Labs drawn from portacath today   Thank you for choosing [facility] at [facility] to provide your oncology and hematology care.  To afford each patient quality time with our provider, please arrive at least 15 minutes before your scheduled appointment time.   If you have a lab appointment with the Cancer Center please come in thru the  Main Entrance and check in at the main information desk  You need to re-schedule your appointment should you arrive 10 or more minutes late.  We strive to give you quality time with our providers, and arriving late affects you and other patients whose appointments are after yours.  Also, if you no show three or more times for appointments you may be dismissed from the clinic at the providers discretion.     Again, thank you for choosing [facility].  Our hope is that these requests will decrease the amount of time that you wait before being seen by our physicians.       _____________________________________________________________  Should you have questions after your visit to [facility], please contact our office at [phone] between the hours of 8:00 a.m. and 4:30 p.m.  Voicemails left after 4:00 p.m. will not be returned until the following business day.  For prescription refill requests, have your pharmacy contact our office and allow 72 hours.    Cancer Center Support Programs:   > Cancer Support Group  2nd Tuesday of the month 1pm-2pm, Journey Room   

## 2019-07-11 NOTE — Progress Notes (Signed)
0855 Labs reviewed with and pt seen by Dr. Delton Coombes and pt approved for chemo tx today per MD                                                                                     Mikael Spray tolerated chemo tx well without complaints or incident. VSS upon discharge. Pt discharged with 5FU pump infusing without issues. Pt discharged self ambulatory in satisfactory condition

## 2019-07-11 NOTE — Assessment & Plan Note (Signed)
1.  Stage III (PT4BPN1A) transverse colon adenocarcinoma: - Laparoscopic right colectomy on 01/09/2019, pathology showing 4 cm moderately differentiated invasive adenocarcinoma, grade 2, 1/25 lymph nodes positive, no tumor deposits, no perineural invasion.  Tumor extends through serosa into adherent omentum.  Margins negative. - PET scan on 01/31/2019 shows focal hypermetabolic activity identified in the ileocolic anastomosis with SUV of 9.  This could be related to postsurgical changes but follow-up as needed. - 8 cycles of FOLFOX from 03/01/2019 through 06/20/2019 (oxaliplatin dose reduced to 68 mg per metered square starting cycle 5 secondary to laryngopharyngeal dysesthesia). - Her chemotherapy was held last week secondary to platelet count of 70.  Today platelet count improved to 168.  She may proceed with cycle 9 at the same dose level. -She has some minimal numbness in the fingers which is constant.  She also has enhanced cold sensitivity.  We will keep a close eye on it.  If there is any worsening, we will consider discontinuing oxaliplatin after cycle 10. -We will see her back in 2 weeks for follow-up.   2.  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  [place] was high by PCR.  BRAF testing was negative. - MLH1 hyper methylation was present.  This was thought to be a sporadic cancer.  3.  Vitamin D deficiency: - Vitamin D level on 05/23/2019 was 14.8. -She will continue vitamin D 50,000 units weekly.

## 2019-07-11 NOTE — Patient Instructions (Addendum)
[facility] at [facility] Discharge Instructions  You were seen today by Dr. Katragadda. He went over your recent lab results. He will see you back in 2 weeks for labs and follow up.   Thank you for choosing [facility] at [facility] to provide your oncology and hematology care.  To afford each patient quality time with our provider, please arrive at least 15 minutes before your scheduled appointment time.   If you have a lab appointment with the Cancer Center please come in thru the  Main Entrance and check in at the main information desk  You need to re-schedule your appointment should you arrive 10 or more minutes late.  We strive to give you quality time with our providers, and arriving late affects you and other patients whose appointments are after yours.  Also, if you no show three or more times for appointments you may be dismissed from the clinic at the providers discretion.     Again, thank you for choosing [facility].  Our hope is that these requests will decrease the amount of time that you wait before being seen by our physicians.       _____________________________________________________________  Should you have questions after your visit to [facility], please contact our office at [phone] between the hours of 8:00 a.m. and 4:30 p.m.  Voicemails left after 4:00 p.m. will not be returned until the following business day.  For prescription refill requests, have your pharmacy contact our office and allow 72 hours.    Cancer Center Support Programs:   > Cancer Support Group  2nd Tuesday of the month 1pm-2pm, Journey Room    

## 2019-07-11 NOTE — Patient Instructions (Signed)
[facility] Discharge Instructions for Patients Receiving Chemotherapy   Beginning January 23rd 2017 lab work for the Cancer Center will be done in the  Main lab at [facility] on 1st floor. If you have a lab appointment with the Cancer Center please come in thru the  Main Entrance and check in at the main information desk   Today you received the following chemotherapy agents Oxaliplatin,Leucovorin and 5FU. Follow-up as scheduled. Call clinic for any questions or concerns  To help prevent nausea and vomiting after your treatment, we encourage you to take your nausea medication   If you develop nausea and vomiting, or diarrhea that is not controlled by your medication, call the clinic.  The clinic phone number is [phone]. Office hours are Monday-Friday 8:30am-5:00pm.  BELOW ARE SYMPTOMS THAT SHOULD BE REPORTED IMMEDIATELY:  *FEVER GREATER THAN 101.0 F  *CHILLS WITH OR WITHOUT FEVER  NAUSEA AND VOMITING THAT IS NOT CONTROLLED WITH YOUR NAUSEA MEDICATION  *UNUSUAL SHORTNESS OF BREATH  *UNUSUAL BRUISING OR BLEEDING  TENDERNESS IN MOUTH AND THROAT WITH OR WITHOUT PRESENCE OF ULCERS  *URINARY PROBLEMS  *BOWEL PROBLEMS  UNUSUAL RASH Items with * indicate a potential emergency and should be followed up as soon as possible. If you have an emergency after office hours please contact your primary care physician or go to the nearest emergency department.  Please call the clinic during office hours if you have any questions or concerns.   You may also contact the Patient Navigator at [phone] should you have any questions or need assistance in obtaining follow up care.      Resources For Cancer Patients and their Caregivers ? American Cancer Society: Can assist with transportation, wigs, general needs, runs Look Good Feel Better.        1-888-227-6333 ? Cancer Care: Provides financial assistance, online support groups, medication/co-pay assistance.   1-800-813-HOPE (4673) ? [facility] Assists [place] cancer patients and their families through emotional , educational and financial support.  336-427-4357 ? [place] DSS Where to apply for food stamps, Medicaid and utility assistance. 336-342-1394 ? [facility]: Transportation to medical appointments. 336-347-2287 ? Social Security Administration: May apply for disability if have a Stage IV cancer. 336-342-7796 1-800-772-1213 ? [place] Aging, Disability and Transit Services: Assists with nutrition, care and transit needs. 336-349-2343         

## 2019-07-11 NOTE — Progress Notes (Signed)
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:  CURRENT THERAPY: FOLFOX  BRIEF ONCOLOGIC HISTORY:  Oncology History  Colon cancer ([place])  01/09/2019 Initial Diagnosis   Colon cancer ([place])   03/01/2019 -  Chemotherapy   The patient had palonosetron (ALOXI) injection 0.25 mg, 0.25 mg, Intravenous,  Once, 9 of 12 cycles Administration: 0.25 mg (03/01/2019), 0.25 mg (03/15/2019), 0.25 mg (03/27/2019), 0.25 mg (04/10/2019), 0.25 mg (05/09/2019), 0.25 mg (05/23/2019), 0.25 mg (06/06/2019), 0.25 mg (06/20/2019), 0.25 mg (07/11/2019) pegfilgrastim (NEULASTA) injection 6 mg, 6 mg, Subcutaneous, Once, 5 of 8 cycles Administration: 6 mg (05/11/2019), 6 mg (05/25/2019), 6 mg (06/08/2019), 6 mg (06/22/2019) leucovorin 600 mg in dextrose 5 % 250 mL infusion, 640 mg, Intravenous,  Once, 9 of 12 cycles Administration: 600 mg (03/01/2019), 600 mg (03/15/2019), 640 mg (03/27/2019), 640 mg (04/10/2019), 640 mg (05/09/2019), 640 mg (05/23/2019), 640 mg (06/06/2019), 640 mg (06/20/2019), 640 mg (07/11/2019) oxaliplatin (ELOXATIN) 135 mg in dextrose 5 % 500 mL chemo infusion, 85 mg/m2 = 135 mg, Intravenous,  Once, 9 of 12 cycles Dose modification: 68 mg/m2 (80 % of original dose 85 mg/m2, Cycle 5, Reason: Provider Judgment), 68 mg/m2 (original dose 85 mg/m2, Cycle 6, Reason: Provider Judgment) Administration: 135 mg (03/01/2019), 135 mg (03/15/2019), 135 mg (03/27/2019), 135 mg (04/10/2019), 110 mg (05/09/2019), 110 mg (05/23/2019), 110 mg (06/06/2019), 110 mg (06/20/2019), 110 mg (07/11/2019) fluorouracil (ADRUCIL) chemo injection 650 mg, 400 mg/m2 = 650 mg, Intravenous,  Once, 9 of 12 cycles Administration: 650 mg (03/01/2019), 650 mg (03/15/2019), 650 mg (03/27/2019), 650 mg (04/10/2019), 650 mg (05/09/2019),  650 mg (05/23/2019), 650 mg (06/06/2019), 650 mg (06/20/2019) fluorouracil (ADRUCIL) 3,850 mg in sodium chloride 0.9 % 73 mL chemo infusion, 2,400 mg/m2 = 3,850 mg, Intravenous, 1 Day/Dose, 9 of 12 cycles Administration: 3,850 mg (03/01/2019), 3,850 mg (03/15/2019), 3,850 mg (03/27/2019), 3,850 mg (04/10/2019), 3,850 mg (05/09/2019), 3,850 mg (05/23/2019), 3,850 mg (06/06/2019), 3,850 mg (06/20/2019)  for chemotherapy treatment.        INTERVAL HISTORY:  Ms. Leeth 63 y.o. female seen for follow-up of colon cancer.  Denies any nausea vomiting diarrhea or constipation.  Occasional palpitations noted last week.  Cough with clear sputum is stable.  Reports constant numbness in the fingertips which is very minimal.  She had enhanced cold sensitivity for the first 1 week.  Denies any fevers or infections.  Appetite is 75%.  Energy levels are 75%.  Denies any bleeding episodes.  REVIEW OF SYSTEMS:  Review of Systems  Respiratory: Positive for cough.   Cardiovascular: Positive for palpitations.  Neurological: Positive for numbness.  Psychiatric/Behavioral: Positive for sleep disturbance.  All other systems reviewed and are negative.    PAST MEDICAL/SURGICAL HISTORY:  Past Medical History:  Diagnosis Date  . Hyperlipidemia   . Multinodular goiter   . Prediabetes    Past Surgical History:  Procedure Laterality Date  . BIOPSY  12/07/2018   Procedure: BIOPSY;  Surgeon: Rogene Houston, MD;  [facility];  Service: Endoscopy;;  sigmoid colon  . COLON RESECTION N/A 01/09/2019   Procedure: LAPAROSCOPIC RIGHT HEMICOLECTOMY;  Surgeon: Virl Cagey, MD;  Location: [facility];  Service: General;  Laterality: N/A;  . COLONOSCOPY N/A 12/07/2018   Procedure: COLONOSCOPY;  Surgeon: Laural Golden,  Mechele Dawley, MD;  [facility]: [facility];  Service: Endoscopy;  Laterality: N/A;  12:45  . DILATION AND CURETTAGE OF UTERUS  2006  . FNA thyroid  2011 and 2019   benign  . POLYPECTOMY  12/07/2018   Procedure:  POLYPECTOMY;  Surgeon: Rogene Houston, MD;  [facility];  Service: Endoscopy;;  splenic flexure (CS x1)  . PORTACATH PLACEMENT Left 02/15/2019   Procedure: INSERTION PORT-A-CATH;  Surgeon: Virl Cagey, MD;  Location: [facility];  Service: General;  Laterality: Left;  . TUBAL LIGATION  1992     SOCIAL HISTORY:  Social History   Socioeconomic History  . Marital status: Married    Spouse name: Not on file  . Number of children: 1  . Years of education: Not on file  . Highest education level: Not on file  Occupational History  . Occupation: [employer]  . Financial resource strain: Not on file  . Food insecurity    Worry: Not on file    Inability: Not on file  . Transportation needs    Medical: Not on file    Non-medical: Not on file  Tobacco Use  . Smoking status: Current Every Day Smoker    Packs/day: 0.75    Years: 45.00    Pack years: 33.75    Types: Cigarettes  . Smokeless tobacco: Never Used  Substance and Sexual Activity  . Alcohol use: Yes    Alcohol/week: 0.0 standard drinks    Comment: occasionally  . Drug use: No  . Sexual activity: Yes  Lifestyle  . Physical activity    Days per week: Not on file    Minutes per session: Not on file  . Stress: Not on file  Relationships  . Social Herbalist on phone: Not on file    Gets together: Not on file    Attends religious service: Not on file    Active member of club or organization: Not on file    Attends meetings of clubs or organizations: Not on file    Relationship status: Not on file  . Intimate partner violence    Fear of current or ex partner: Not on file    Emotionally abused: Not on file    Physically abused: Not on file    Forced sexual activity: Not on file  Other Topics Concern  . Not on file  Social History Narrative  . Not on file    FAMILY HISTORY:  Family History  Problem Relation Age of Onset  . Hypertension Mother        AAA  .  Coronary artery disease Mother   . Hyperlipidemia Mother   . Cancer Mother        lung  . Hypertension Father   . Cancer Brother     CURRENT MEDICATIONS:  Outpatient Encounter Medications as of 07/11/2019  Medication Sig Note  . acetaminophen (TYLENOL) 500 MG tablet Take 1,000 mg by mouth every 6 (six) hours as needed for moderate pain.    Marland Kitchen ALPRAZolam (XANAX) 0.25 MG tablet Take one tablet at bedtime, as needed , for anxiety   . aspirin EC 81 MG tablet Take 1 tablet (81 mg total) by mouth every evening.   . Calcium Carbonate-Vitamin D (CALCIUM 600+D) 600-400 MG-UNIT tablet Take 1 tablet by mouth 2 (two) times a week. Sunday and Wednesday evenings   . docusate sodium (COLACE) 100 MG capsule Take 1 capsule (  100 mg total) by mouth 2 (two) times daily.   . ergocalciferol (VITAMIN D2) 1.25 MG (50000 UT) capsule Take 1 capsule (50,000 Units total) by mouth once a week.   . fluorouracil CALGB 65784 in sodium chloride 0.9 % 150 mL Inject into the vein 2 days. Every 14 days 02/02/2019: Has not started yet  . LEUCOVORIN CALCIUM IV Inject into the vein every 14 (fourteen) days. 02/02/2019: Has not started yet  . lidocaine-prilocaine (EMLA) cream Apply to port a cath site one hour prior to chemotherapy appointment to numb the skin over your port site. 02/02/2019: Has not started yet  . ondansetron (ZOFRAN-ODT) 4 MG disintegrating tablet Take 1 tablet (4 mg total) by mouth every 6 (six) hours as needed for nausea.   . OXALIPLATIN IV Inject into the vein every 14 (fourteen) days. 02/02/2019: Has not started yet  . pravastatin (PRAVACHOL) 40 MG tablet TAKE 1 TABLET BY MOUTH  DAILY IN THE EVENING (Patient taking differently: Take 40 mg by mouth 4 (four) times a week. )    Facility-Administered Encounter Medications as of 07/11/2019  Medication  . sodium chloride flush (NS) 0.9 % injection 10 mL    ALLERGIES:  No Known Allergies   PHYSICAL EXAM:  ECOG Performance status: 1  Vitals:   07/11/19 0753   BP: (!) 152/81  Pulse: 78  Resp: 16  Temp: (!) 97.1 F (36.2 C)  SpO2: 100%   Filed Weights   07/11/19 0753  Weight: 124 lb 6.4 oz (56.4 kg)    Physical Exam Constitutional:      Appearance: Normal appearance.  Neck:     Musculoskeletal: Normal range of motion.  Cardiovascular:     Rate and Rhythm: Normal rate and regular rhythm.     Pulses: Normal pulses.     Heart sounds: Normal heart sounds.  Pulmonary:     Effort: Pulmonary effort is normal.     Breath sounds: Normal breath sounds.  Abdominal:     General: There is no distension.     Palpations: Abdomen is soft. There is no mass.  Musculoskeletal: Normal range of motion.  Skin:    General: Skin is warm and dry.  Neurological:     General: No focal deficit present.     Mental Status: She is alert and oriented to person, place, and time.  Psychiatric:        Mood and Affect: Mood normal.        Behavior: Behavior normal.      LABORATORY DATA:  I have reviewed the labs as listed.  CBC    Component Value Date/Time   WBC 4.0 07/11/2019 0805   RBC 3.73 (L) 07/11/2019 0805   HGB 11.3 (L) 07/11/2019 0805   HGB 13.5 08/18/2018 0758   HCT 35.3 (L) 07/11/2019 0805   HCT 37.2 08/18/2018 0758   PLT 168 07/11/2019 0805   PLT 259 08/18/2018 0758   MCV 94.6 07/11/2019 0805   MCV 104 (H) 08/18/2018 0758   MCH 30.3 07/11/2019 0805   MCHC 32.0 07/11/2019 0805   RDW 18.1 (H) 07/11/2019 0805   RDW 13.0 08/18/2018 0758   LYMPHSABS 1.5 07/11/2019 0805   LYMPHSABS 3.5 (H) 08/31/2017 0955   MONOABS 0.4 07/11/2019 0805   EOSABS 0.0 07/11/2019 0805   EOSABS 0.1 08/31/2017 0955   BASOSABS 0.0 07/11/2019 0805   BASOSABS 0.0 08/31/2017 0955   CMP Latest Ref Rng & Units 07/11/2019 07/04/2019 06/20/2019  Glucose 70 - 99 mg/dL 122(H)  141(H) 132(H)  BUN 8 - 23 mg/dL 7(L) 10 9  Creatinine 0.44 - 1.00 mg/dL 0.59 0.69 0.67  Sodium 135 - 145 mmol/L 139 140 139  Potassium 3.5 - 5.1 mmol/L 3.5 3.6 3.5  Chloride 98 - 111 mmol/L 106  108 106  CO2 22 - 32 mmol/L _0 Calcium 8.9 - 10.3 mg/dL 9.4 9.5 9.4  Total Protein 6.5 - 8.1 g/dL 7.1 7.0 7.0  Total Bilirubin 0.3 - 1.2 mg/dL 1.2 1.0 0.8  Alkaline Phos 38 - 126 U/L 104 120 115  AST 15 - 41 U/L 35 34 29  ALT 0 - 44 U/L _1 I have independently reviewed her scans.    ASSESSMENT & PLAN:   Malignant neoplasm of transverse colon ([place]) 1.  Stage III (PT4BPN1A) transverse colon adenocarcinoma: - Laparoscopic right colectomy on 01/09/2019, pathology showing 4 cm moderately differentiated invasive adenocarcinoma, grade 2, 1/25 lymph nodes positive, no tumor deposits, no perineural invasion.  Tumor extends through serosa into adherent omentum.  Margins negative. - PET scan on 01/31/2019 shows focal hypermetabolic activity identified in the ileocolic anastomosis with SUV of 9.  This could be related to postsurgical changes but follow-up as needed. - 8 cycles of FOLFOX from 03/01/2019 through 06/20/2019 (oxaliplatin dose reduced to 68 mg per metered square starting cycle 5 secondary to laryngopharyngeal dysesthesia). - Her chemotherapy was held last week secondary to platelet count of 70.  Today platelet count improved to 168.  She may proceed with cycle 9 at the same dose level. -She has some minimal numbness in the fingers which is constant.  She also has enhanced cold sensitivity.  We will keep a close eye on it.  If there is any worsening, we will consider discontinuing oxaliplatin after cycle 10. -We will see her back in 2 weeks for follow-up.   2.  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  [place] was high by PCR.  BRAF testing was negative. - MLH1 hyper methylation was present.  This was thought to be a sporadic cancer.  3.  Vitamin D deficiency: - Vitamin D level on 05/23/2019 was 14.8. -She will continue vitamin D 50,000 units weekly.   Total time spent is 25 minutes with more than 50% of the time spent face-to-face discussing lab results,  treatment plan and coordination of care.   Derek Jack, MD  [place] [phone]

## 2019-07-13 ENCOUNTER — Other Ambulatory Visit: Payer: Self-pay

## 2019-07-13 ENCOUNTER — Encounter (HOSPITAL_COMMUNITY): Payer: Self-pay

## 2019-07-13 ENCOUNTER — Inpatient Hospital Stay (HOSPITAL_COMMUNITY): Payer: 59

## 2019-07-13 VITALS — BP 117/71 | HR 78 | Temp 97.5°F | Resp 18

## 2019-07-13 DIAGNOSIS — C184 Malignant neoplasm of transverse colon: Secondary | ICD-10-CM

## 2019-07-13 DIAGNOSIS — Z5189 Encounter for other specified aftercare: Secondary | ICD-10-CM | POA: Diagnosis not present

## 2019-07-13 MED ORDER — HEPARIN SOD (PORK) LOCK FLUSH 100 UNIT/ML IV SOLN
500.0000 [IU] | Freq: Once | INTRAVENOUS | Status: AC | PRN
  Administered 2019-07-13: 14:00:00 500 [IU]

## 2019-07-13 MED ORDER — SODIUM CHLORIDE 0.9% FLUSH
10.0000 mL | INTRAVENOUS | Status: DC | PRN
  Administered 2019-07-13: 10 mL
  Filled 2019-07-13: qty 10

## 2019-07-13 MED ORDER — PEGFILGRASTIM INJECTION 6 MG/0.6ML ~~LOC~~
6.0000 mg | PREFILLED_SYRINGE | Freq: Once | SUBCUTANEOUS | Status: AC
  Administered 2019-07-13: 6 mg via SUBCUTANEOUS
  Filled 2019-07-13: qty 0.6

## 2019-07-13 NOTE — Progress Notes (Signed)
Patients port flushed without difficulty.  Good blood return noted before and after flush. No complaints of pain with flush and no bruising or swelling noted at site.  Band aid applied.  VSS with discharge and left ambulatory with no s/s of distress noted.  

## 2019-07-18 ENCOUNTER — Ambulatory Visit (HOSPITAL_COMMUNITY): Payer: 59

## 2019-07-18 ENCOUNTER — Ambulatory Visit (HOSPITAL_COMMUNITY): Payer: 59 | Admitting: Hematology

## 2019-07-18 ENCOUNTER — Other Ambulatory Visit (HOSPITAL_COMMUNITY): Payer: 59

## 2019-07-20 ENCOUNTER — Encounter (HOSPITAL_COMMUNITY): Payer: 59

## 2019-07-25 ENCOUNTER — Inpatient Hospital Stay (HOSPITAL_COMMUNITY): Payer: 59

## 2019-07-25 ENCOUNTER — Other Ambulatory Visit: Payer: Self-pay | Admitting: Family Medicine

## 2019-07-25 ENCOUNTER — Inpatient Hospital Stay (HOSPITAL_BASED_OUTPATIENT_CLINIC_OR_DEPARTMENT_OTHER): Payer: 59 | Admitting: Hematology

## 2019-07-25 ENCOUNTER — Other Ambulatory Visit: Payer: Self-pay

## 2019-07-25 ENCOUNTER — Encounter (HOSPITAL_COMMUNITY): Payer: Self-pay

## 2019-07-25 ENCOUNTER — Inpatient Hospital Stay (HOSPITAL_COMMUNITY): Payer: 59 | Attending: Hematology

## 2019-07-25 ENCOUNTER — Encounter (HOSPITAL_COMMUNITY): Payer: Self-pay | Admitting: Hematology

## 2019-07-25 VITALS — BP 152/75 | HR 66 | Temp 97.5°F | Resp 18

## 2019-07-25 DIAGNOSIS — E559 Vitamin D deficiency, unspecified: Secondary | ICD-10-CM | POA: Diagnosis not present

## 2019-07-25 DIAGNOSIS — F17210 Nicotine dependence, cigarettes, uncomplicated: Secondary | ICD-10-CM | POA: Insufficient documentation

## 2019-07-25 DIAGNOSIS — R197 Diarrhea, unspecified: Secondary | ICD-10-CM | POA: Insufficient documentation

## 2019-07-25 DIAGNOSIS — C184 Malignant neoplasm of transverse colon: Secondary | ICD-10-CM

## 2019-07-25 DIAGNOSIS — R05 Cough: Secondary | ICD-10-CM | POA: Insufficient documentation

## 2019-07-25 DIAGNOSIS — Z7289 Other problems related to lifestyle: Secondary | ICD-10-CM | POA: Insufficient documentation

## 2019-07-25 DIAGNOSIS — R002 Palpitations: Secondary | ICD-10-CM | POA: Diagnosis not present

## 2019-07-25 DIAGNOSIS — G479 Sleep disorder, unspecified: Secondary | ICD-10-CM | POA: Diagnosis not present

## 2019-07-25 DIAGNOSIS — Z5189 Encounter for other specified aftercare: Secondary | ICD-10-CM | POA: Insufficient documentation

## 2019-07-25 DIAGNOSIS — R5383 Other fatigue: Secondary | ICD-10-CM | POA: Diagnosis not present

## 2019-07-25 DIAGNOSIS — Z5111 Encounter for antineoplastic chemotherapy: Secondary | ICD-10-CM | POA: Diagnosis not present

## 2019-07-25 DIAGNOSIS — Z79899 Other long term (current) drug therapy: Secondary | ICD-10-CM | POA: Diagnosis not present

## 2019-07-25 DIAGNOSIS — G629 Polyneuropathy, unspecified: Secondary | ICD-10-CM | POA: Diagnosis not present

## 2019-07-25 DIAGNOSIS — R110 Nausea: Secondary | ICD-10-CM | POA: Insufficient documentation

## 2019-07-25 LAB — CBC WITH DIFFERENTIAL/PLATELET
Abs Immature Granulocytes: 0.01 10*3/uL (ref 0.00–0.07)
Basophils Absolute: 0 10*3/uL (ref 0.0–0.1)
Basophils Relative: 1 %
Eosinophils Absolute: 0 10*3/uL (ref 0.0–0.5)
Eosinophils Relative: 1 %
HCT: 34.7 % — ABNORMAL LOW (ref 36.0–46.0)
Hemoglobin: 11.2 g/dL — ABNORMAL LOW (ref 12.0–15.0)
Immature Granulocytes: 0 %
Lymphocytes Relative: 32 %
Lymphs Abs: 1.1 10*3/uL (ref 0.7–4.0)
MCH: 29.8 pg (ref 26.0–34.0)
MCHC: 32.3 g/dL (ref 30.0–36.0)
MCV: 92.3 fL (ref 80.0–100.0)
Monocytes Absolute: 0.2 10*3/uL (ref 0.1–1.0)
Monocytes Relative: 7 %
Neutro Abs: 2 10*3/uL (ref 1.7–7.7)
Neutrophils Relative %: 59 %
Platelets: 96 10*3/uL — ABNORMAL LOW (ref 150–400)
RBC: 3.76 MIL/uL — ABNORMAL LOW (ref 3.87–5.11)
RDW: 17.7 % — ABNORMAL HIGH (ref 11.5–15.5)
WBC: 3.4 10*3/uL — ABNORMAL LOW (ref 4.0–10.5)
nRBC: 0 % (ref 0.0–0.2)

## 2019-07-25 LAB — COMPREHENSIVE METABOLIC PANEL
ALT: 16 U/L (ref 0–44)
AST: 28 U/L (ref 15–41)
Albumin: 4 g/dL (ref 3.5–5.0)
Alkaline Phosphatase: 110 U/L (ref 38–126)
Anion gap: 10 (ref 5–15)
BUN: 13 mg/dL (ref 8–23)
CO2: 24 mmol/L (ref 22–32)
Calcium: 9.4 mg/dL (ref 8.9–10.3)
Chloride: 105 mmol/L (ref 98–111)
Creatinine, Ser: 0.58 mg/dL (ref 0.44–1.00)
GFR calc Af Amer: >60 mL/min (ref >60)
GFR calc non Af Amer: >60 mL/min (ref >60)
Glucose, Bld: 152 mg/dL — ABNORMAL HIGH (ref 70–99)
Potassium: 3.7 mmol/L (ref 3.5–5.1)
Sodium: 139 mmol/L (ref 135–145)
Total Bilirubin: 0.8 mg/dL (ref 0.3–1.2)
Total Protein: 7.2 g/dL (ref 6.5–8.1)

## 2019-07-25 MED ORDER — FLUOROURACIL CHEMO INJECTION 2.5 GM/50ML
400.0000 mg/m2 | Freq: Once | INTRAVENOUS | Status: AC
  Administered 2019-07-25: 650 mg via INTRAVENOUS
  Filled 2019-07-25: qty 13

## 2019-07-25 MED ORDER — OXALIPLATIN CHEMO INJECTION 100 MG/20ML
51.0000 mg/m2 | Freq: Once | INTRAVENOUS | Status: AC
  Administered 2019-07-25: 80 mg via INTRAVENOUS
  Filled 2019-07-25: qty 16

## 2019-07-25 MED ORDER — PALONOSETRON HCL INJECTION 0.25 MG/5ML
0.2500 mg | Freq: Once | INTRAVENOUS | Status: AC
  Administered 2019-07-25: 0.25 mg via INTRAVENOUS
  Filled 2019-07-25: qty 5

## 2019-07-25 MED ORDER — ONDANSETRON HCL 4 MG PO TABS: 4.0000 mg | ORAL_TABLET | Freq: Three times a day (TID) | ORAL | 1 refills | Status: DC | PRN

## 2019-07-25 MED ORDER — DEXTROSE 5 % IV SOLN
Freq: Once | INTRAVENOUS | Status: AC
  Administered 2019-07-25: 09:00:00 via INTRAVENOUS

## 2019-07-25 MED ORDER — DEXTROSE 5 % IV SOLN
Freq: Once | INTRAVENOUS | Status: AC
  Administered 2019-07-25: 10:00:00 via INTRAVENOUS

## 2019-07-25 MED ORDER — SODIUM CHLORIDE 0.9% FLUSH
10.0000 mL | INTRAVENOUS | Status: DC | PRN
  Administered 2019-07-25: 10 mL
  Filled 2019-07-25: qty 10

## 2019-07-25 MED ORDER — LEUCOVORIN CALCIUM INJECTION 350 MG
400.0000 mg/m2 | Freq: Once | INTRAVENOUS | Status: AC
  Administered 2019-07-25: 640 mg via INTRAVENOUS
  Filled 2019-07-25: qty 32

## 2019-07-25 MED ORDER — SODIUM CHLORIDE 0.9 % IV SOLN
2400.0000 mg/m2 | INTRAVENOUS | Status: DC
  Administered 2019-07-25: 3850 mg via INTRAVENOUS
  Filled 2019-07-25: qty 77

## 2019-07-25 MED ORDER — SODIUM CHLORIDE 0.9 % IV SOLN
10.0000 mg | Freq: Once | INTRAVENOUS | Status: AC
  Administered 2019-07-25: 10 mg via INTRAVENOUS
  Filled 2019-07-25: qty 10

## 2019-07-25 NOTE — Progress Notes (Signed)
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:  CURRENT THERAPY: FOLFOX  BRIEF ONCOLOGIC HISTORY:  Oncology History  Colon cancer ([place])  01/09/2019 Initial Diagnosis   Colon cancer ([place])   03/01/2019 -  Chemotherapy   The patient had palonosetron (ALOXI) injection 0.25 mg, 0.25 mg, Intravenous,  Once, 9 of 12 cycles Administration: 0.25 mg (03/01/2019), 0.25 mg (03/15/2019), 0.25 mg (03/27/2019), 0.25 mg (04/10/2019), 0.25 mg (05/09/2019), 0.25 mg (05/23/2019), 0.25 mg (06/06/2019), 0.25 mg (06/20/2019), 0.25 mg (07/11/2019) pegfilgrastim (NEULASTA) injection 6 mg, 6 mg, Subcutaneous, Once, 5 of 8 cycles Administration: 6 mg (05/11/2019), 6 mg (05/25/2019), 6 mg (06/08/2019), 6 mg (06/22/2019), 6 mg (07/13/2019) leucovorin 600 mg in dextrose 5 % 250 mL infusion, 640 mg, Intravenous,  Once, 9 of 12 cycles Administration: 600 mg (03/01/2019), 600 mg (03/15/2019), 640 mg (03/27/2019), 640 mg (04/10/2019), 640 mg (05/09/2019), 640 mg (05/23/2019), 640 mg (06/06/2019), 640 mg (06/20/2019), 640 mg (07/11/2019) oxaliplatin (ELOXATIN) 135 mg in dextrose 5 % 500 mL chemo infusion, 85 mg/m2 = 135 mg, Intravenous,  Once, 9 of 12 cycles Dose modification: 68 mg/m2 (80 % of original dose 85 mg/m2, Cycle 5, Reason: Provider Judgment), 68 mg/m2 (original dose 85 mg/m2, Cycle 6, Reason: Provider Judgment) Administration: 135 mg (03/01/2019), 135 mg (03/15/2019), 135 mg (03/27/2019), 135 mg (04/10/2019), 110 mg (05/09/2019), 110 mg (05/23/2019), 110 mg (06/06/2019), 110 mg (06/20/2019), 110 mg (07/11/2019) fluorouracil (ADRUCIL) chemo injection 650 mg, 400 mg/m2 = 650 mg, Intravenous,  Once, 9 of 12 cycles Administration: 650 mg (03/01/2019), 650 mg (03/15/2019), 650 mg (03/27/2019), 650 mg (04/10/2019), 650  mg (05/09/2019), 650 mg (05/23/2019), 650 mg (06/06/2019), 650 mg (06/20/2019), 650 mg (07/11/2019) fluorouracil (ADRUCIL) 3,850 mg in sodium chloride 0.9 % 73 mL chemo infusion, 2,400 mg/m2 = 3,850 mg, Intravenous, 1 Day/Dose, 9 of 12 cycles Administration: 3,850 mg (03/01/2019), 3,850 mg (03/15/2019), 3,850 mg (03/27/2019), 3,850 mg (04/10/2019), 3,850 mg (05/09/2019), 3,850 mg (05/23/2019), 3,850 mg (06/06/2019), 3,850 mg (06/20/2019), 3,850 mg (07/11/2019)  for chemotherapy treatment.        INTERVAL HISTORY:  [name] 63 y.o. female seen for follow-up of colon cancer.  Denies any nausea vomiting diarrhea or constipation.  Occasional palpitations noted last week.  Cough with clear sputum is stable.  Reports constant numbness in the fingertips which is very minimal.  She had enhanced cold sensitivity for the first 1 week.  Denies any fevers or infections.  Appetite is 75%.  Energy levels are 75%.  Denies any bleeding episodes.  REVIEW OF SYSTEMS:  Review of Systems  Respiratory: Positive for cough.   Cardiovascular: Positive for palpitations.  Neurological: Positive for numbness.  Psychiatric/Behavioral: Positive for sleep disturbance.  All other systems reviewed and are negative.    PAST MEDICAL/SURGICAL HISTORY:  Past Medical History:  Diagnosis Date  . Hyperlipidemia   . Multinodular goiter   . Prediabetes    Past Surgical History:  Procedure Laterality Date  . BIOPSY  12/07/2018   Procedure: BIOPSY;  Surgeon: Andrea Houston, MD;  [facility];  Service: Endoscopy;;  sigmoid colon  . COLON RESECTION N/A 01/09/2019   Procedure: LAPAROSCOPIC RIGHT HEMICOLECTOMY;  Surgeon: Andrea Cagey, MD;  Location: [facility];  Service: General;  Laterality: N/A;  . COLONOSCOPY  N/A 12/07/2018   Procedure: COLONOSCOPY;  Surgeon: Andrea Houston, MD;  [facility];  Service: Endoscopy;  Laterality: N/A;  12:45  . DILATION AND CURETTAGE OF UTERUS  2006  . FNA thyroid  2011 and  2019   benign  . POLYPECTOMY  12/07/2018   Procedure: POLYPECTOMY;  Surgeon: Andrea Houston, MD;  [facility];  Service: Endoscopy;;  splenic flexure (CS x1)  . PORTACATH PLACEMENT Left 02/15/2019   Procedure: INSERTION PORT-A-CATH;  Surgeon: Andrea Cagey, MD;  Location: [facility];  Service: General;  Laterality: Left;  . TUBAL LIGATION  1992     SOCIAL HISTORY:  Social History   Socioeconomic History  . Marital status: Married    Spouse name: Not on file  . Number of children: 1  . Years of education: Not on file  . Highest education level: Not on file  Occupational History  . Occupation: [employer]  . Financial resource strain: Not on file  . Food insecurity    Worry: Not on file    Inability: Not on file  . Transportation needs    Medical: Not on file    Non-medical: Not on file  Tobacco Use  . Smoking status: Current Every Day Smoker    Packs/day: 0.75    Years: 45.00    Pack years: 33.75    Types: Cigarettes  . Smokeless tobacco: Never Used  Substance and Sexual Activity  . Alcohol use: Yes    Alcohol/week: 0.0 standard drinks    Comment: occasionally  . Drug use: No  . Sexual activity: Yes  Lifestyle  . Physical activity    Days per week: Not on file    Minutes per session: Not on file  . Stress: Not on file  Relationships  . Social Herbalist on phone: Not on file    Gets together: Not on file    Attends religious service: Not on file    Active member of club or organization: Not on file    Attends meetings of clubs or organizations: Not on file    Relationship status: Not on file  . Intimate partner violence    Fear of current or ex partner: Not on file    Emotionally abused: Not on file    Physically abused: Not on file    Forced sexual activity: Not on file  Other Topics Concern  . Not on file  Social History Narrative  . Not on file    FAMILY HISTORY:  Family History  Problem  Relation Age of Onset  . Hypertension Mother        AAA  . Coronary artery disease Mother   . Hyperlipidemia Mother   . Cancer Mother        lung  . Hypertension Father   . Cancer Brother     CURRENT MEDICATIONS:  Outpatient Encounter Medications as of 07/25/2019  Medication Sig Note  . acetaminophen (TYLENOL) 500 MG tablet Take 1,000 mg by mouth every 6 (six) hours as needed for moderate pain.    Marland Kitchen ALPRAZolam (XANAX) 0.25 MG tablet Take one tablet at bedtime, as needed , for anxiety   . aspirin EC 81 MG tablet Take 1 tablet (81 mg total) by mouth every evening.   . Calcium Carbonate-Vitamin D (CALCIUM 600+D) 600-400 MG-UNIT tablet Take 1 tablet by mouth 2 (two) times a week. Sunday and Wednesday evenings   .  docusate sodium (COLACE) 100 MG capsule Take 1 capsule (100 mg total) by mouth 2 (two) times daily.   . ergocalciferol (VITAMIN D2) 1.25 MG (50000 UT) capsule Take 1 capsule (50,000 Units total) by mouth once a week.   . fluorouracil CALGB 27782 in sodium chloride 0.9 % 150 mL Inject into the vein 2 days. Every 14 days 02/02/2019: Has not started yet  . LEUCOVORIN CALCIUM IV Inject into the vein every 14 (fourteen) days. 02/02/2019: Has not started yet  . lidocaine-prilocaine (EMLA) cream Apply to port a cath site one hour prior to chemotherapy appointment to numb the skin over your port site. 02/02/2019: Has not started yet  . ondansetron (ZOFRAN-ODT) 4 MG disintegrating tablet Take 1 tablet (4 mg total) by mouth every 6 (six) hours as needed for nausea.   . OXALIPLATIN IV Inject into the vein every 14 (fourteen) days. 02/02/2019: Has not started yet  . pravastatin (PRAVACHOL) 40 MG tablet TAKE 1 TABLET BY MOUTH  DAILY IN THE EVENING (Patient taking differently: Take 40 mg by mouth 4 (four) times a week. )    Facility-Administered Encounter Medications as of 07/25/2019  Medication  . sodium chloride flush (NS) 0.9 % injection 10 mL    ALLERGIES:  No Known Allergies   PHYSICAL EXAM:   ECOG Performance status: 1  Vitals:   07/25/19 0800  BP: 124/69  Pulse: 87  Resp: 18  Temp: 97.9 F (36.6 C)  SpO2: 100%   Filed Weights   07/25/19 0800  Weight: 120 lb 12.8 oz (54.8 kg)    Physical Exam Constitutional:      Appearance: Normal appearance.  Neck:     Musculoskeletal: Normal range of motion.  Cardiovascular:     Rate and Rhythm: Normal rate and regular rhythm.     Pulses: Normal pulses.     Heart sounds: Normal heart sounds.  Pulmonary:     Effort: Pulmonary effort is normal.     Breath sounds: Normal breath sounds.  Abdominal:     General: There is no distension.     Palpations: Abdomen is soft. There is no mass.  Musculoskeletal: Normal range of motion.  Skin:    General: Skin is warm and dry.  Neurological:     General: No focal deficit present.     Mental Status: She is alert and oriented to person, place, and time.  Psychiatric:        Mood and Affect: Mood normal.        Behavior: Behavior normal.      LABORATORY DATA:  I have reviewed the labs as listed.  CBC    Component Value Date/Time   WBC 4.0 07/11/2019 0805   RBC 3.73 (L) 07/11/2019 0805   HGB 11.3 (L) 07/11/2019 0805   HGB 13.5 08/18/2018 0758   HCT 35.3 (L) 07/11/2019 0805   HCT 37.2 08/18/2018 0758   PLT 168 07/11/2019 0805   PLT 259 08/18/2018 0758   MCV 94.6 07/11/2019 0805   MCV 104 (H) 08/18/2018 0758   MCH 30.3 07/11/2019 0805   MCHC 32.0 07/11/2019 0805   RDW 18.1 (H) 07/11/2019 0805   RDW 13.0 08/18/2018 0758   LYMPHSABS 1.5 07/11/2019 0805   LYMPHSABS 3.5 (H) 08/31/2017 0955   MONOABS 0.4 07/11/2019 0805   EOSABS 0.0 07/11/2019 0805   EOSABS 0.1 08/31/2017 0955   BASOSABS 0.0 07/11/2019 0805   BASOSABS 0.0 08/31/2017 0955   CMP Latest Ref Rng & Units 07/11/2019 07/04/2019 06/20/2019  Glucose 70 - 99 mg/dL 122(H) 141(H) 132(H)  BUN 8 - 23 mg/dL 7(L) 10 9  Creatinine 0.44 - 1.00 mg/dL 0.59 0.69 0.67  Sodium 135 - 145 mmol/L 139 140 139  Potassium 3.5 - 5.1  mmol/L 3.5 3.6 3.5  Chloride 98 - 111 mmol/L 106 108 106  CO2 22 - 32 mmol/L 25 23 25   Calcium 8.9 - 10.3 mg/dL 9.4 9.5 9.4  Total Protein 6.5 - 8.1 g/dL 7.1 7.0 7.0  Total Bilirubin 0.3 - 1.2 mg/dL 1.2 1.0 0.8  Alkaline Phos 38 - 126 U/L 104 120 115  AST 15 - 41 U/L 35 34 29  ALT 0 - 44 U/L 22 21 20    I have independently reviewed her scans.    ASSESSMENT & PLAN:   No problem-specific Assessment & Plan notes found for this encounter.   Total time spent is 25 minutes with more than 50% of the time spent face-to-face discussing lab results, treatment plan and coordination of care.   Donia Ast, [place] [phone]

## 2019-07-25 NOTE — Progress Notes (Signed)
[id number] Labs including platelets of 96, reviewed with and pt seen by Dr. Delton Coombes and pt approved for chemo tx today with Oxaliplatin dose reduced per MD                            Mikael Spray tolerated chemo tx well without complaints or incident. Pt discharged with 5FU pump infusing without issues. VSS upon discharge. Pt discharged self ambulatory in satisfactory condition

## 2019-07-25 NOTE — Patient Instructions (Addendum)
[facility] at [facility] Discharge Instructions  You were seen today by Dr. Katragadda. He went over your recent lab results. He will see you back in 2 weeks for labs and follow up.   Thank you for choosing [facility] at [facility] to provide your oncology and hematology care.  To afford each patient quality time with our provider, please arrive at least 15 minutes before your scheduled appointment time.   If you have a lab appointment with the Cancer Center please come in thru the  Main Entrance and check in at the main information desk  You need to re-schedule your appointment should you arrive 10 or more minutes late.  We strive to give you quality time with our providers, and arriving late affects you and other patients whose appointments are after yours.  Also, if you no show three or more times for appointments you may be dismissed from the clinic at the providers discretion.     Again, thank you for choosing [facility].  Our hope is that these requests will decrease the amount of time that you wait before being seen by our physicians.       _____________________________________________________________  Should you have questions after your visit to [facility], please contact our office at [phone] between the hours of 8:00 a.m. and 4:30 p.m.  Voicemails left after 4:00 p.m. will not be returned until the following business day.  For prescription refill requests, have your pharmacy contact our office and allow 72 hours.    Cancer Center Support Programs:   > Cancer Support Group  2nd Tuesday of the month 1pm-2pm, Journey Room    

## 2019-07-25 NOTE — Assessment & Plan Note (Addendum)
1.  Stage III (PT4BPN1A) transverse colon adenocarcinoma: -Laparoscopic right colectomy on 01/09/2019, pathology showing 4 cm moderately differentiated invasive adenocarcinoma, grade 2, 1/25 lymph nodes positive, no tumor deposits, no perineural invasion.  Tumor extends through serosa into adherent omentum.  Margins negative. - PET scan on 01/31/2019 shows focal hypermetabolic activity identified in the ileocolic anastomosis with SUV of 9.  This could be related to postsurgical changes, but follow-up is needed. - 9 cycles of FOLFOX from 03/01/2019 through 07/11/2019 (oxaliplatin dose reduced to 68 mg/m2 starting cycle 5 secondary to laryngopharyngeal dysesthesia). -She complained of mild numbness/decreased sensitivity in her feet which is new.  She had it in her fingertips prior to this. - I have recommended cutting back on oxaliplatin dose to 60% starting cycle 10 today.  We reviewed her blood work.  Platelet count is slightly low at 96.  We may proceed with cycle 10 today. -She will come back in 2 weeks for follow-up.  2.  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  MSI was high by PCR.  BRAF testing was negative. - MLH1 hyper methylation was present.  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  3.  Vitamin D deficiency: -Vitamin D level on 05/23/2019 was 14.8. - We will continue vitamin D 50,000 units weekly.  4.  Nausea: -She is taking Zofran ODT 4 mg every 6 hours as needed.  Apparently she does not like the cherry flavor which is causing her to feel nauseous. -We will change her Zofran to regular tablets 4 mg every 6 hours as needed.

## 2019-07-25 NOTE — Progress Notes (Signed)
[place] [place], [place] [address]   CLINIC:  Medical Oncology/Hematology  PCP:  Fayrene Helper, MD [address] [place] [address] [phone]   REASON FOR VISIT:  Follow-up for Stage III (pT4b pN1a) transverse colon adenocarcinoma:  CURRENT THERAPY: FOLFOX  BRIEF ONCOLOGIC HISTORY:  Oncology History  Colon cancer ([facility])  01/09/2019 Initial Diagnosis   Colon cancer ([facility])   03/01/2019 -  Chemotherapy   The patient had palonosetron (ALOXI) injection 0.25 mg, 0.25 mg, Intravenous,  Once, 10 of 12 cycles Administration: 0.25 mg (03/01/2019), 0.25 mg (03/15/2019), 0.25 mg (03/27/2019), 0.25 mg (04/10/2019), 0.25 mg (05/09/2019), 0.25 mg (05/23/2019), 0.25 mg (06/06/2019), 0.25 mg (06/20/2019), 0.25 mg (07/11/2019) pegfilgrastim (NEULASTA) injection 6 mg, 6 mg, Subcutaneous, Once, 6 of 8 cycles Administration: 6 mg (05/11/2019), 6 mg (05/25/2019), 6 mg (06/08/2019), 6 mg (06/22/2019), 6 mg (07/13/2019) leucovorin 600 mg in dextrose 5 % 250 mL infusion, 640 mg, Intravenous,  Once, 10 of 12 cycles Administration: 600 mg (03/01/2019), 600 mg (03/15/2019), 640 mg (03/27/2019), 640 mg (04/10/2019), 640 mg (05/09/2019), 640 mg (05/23/2019), 640 mg (06/06/2019), 640 mg (06/20/2019), 640 mg (07/11/2019) oxaliplatin (ELOXATIN) 135 mg in dextrose 5 % 500 mL chemo infusion, 85 mg/m2 = 135 mg, Intravenous,  Once, 10 of 12 cycles Dose modification: 68 mg/m2 (80 % of original dose 85 mg/m2, Cycle 5, Reason: Provider Judgment), 68 mg/m2 (original dose 85 mg/m2, Cycle 6, Reason: Provider Judgment), 51 mg/m2 (60 % of original dose 85 mg/m2, Cycle 10, Reason: Other (see comments), Comment: neuropathy) Administration: 135 mg (03/01/2019), 135 mg (03/15/2019), 135 mg (03/27/2019), 135 mg (04/10/2019), 110 mg (05/09/2019), 110 mg (05/23/2019), 110 mg (06/06/2019), 110 mg (06/20/2019), 110 mg (07/11/2019) fluorouracil (ADRUCIL) chemo injection 650 mg, 400 mg/m2 = 650 mg, Intravenous,  Once, 10 of  12 cycles Administration: 650 mg (03/01/2019), 650 mg (03/15/2019), 650 mg (03/27/2019), 650 mg (04/10/2019), 650 mg (05/09/2019), 650 mg (05/23/2019), 650 mg (06/06/2019), 650 mg (06/20/2019), 650 mg (07/11/2019) fluorouracil (ADRUCIL) 3,850 mg in sodium chloride 0.9 % 73 mL chemo infusion, 2,400 mg/m2 = 3,850 mg, Intravenous, 1 Day/Dose, 10 of 12 cycles Administration: 3,850 mg (03/01/2019), 3,850 mg (03/15/2019), 3,850 mg (03/27/2019), 3,850 mg (04/10/2019), 3,850 mg (05/09/2019), 3,850 mg (05/23/2019), 3,850 mg (06/06/2019), 3,850 mg (06/20/2019), 3,850 mg (07/11/2019)  for chemotherapy treatment.        INTERVAL HISTORY:  Ms. Baccari 63 y.o. female seen for follow-up and cycle 10 of chemotherapy.  After last cycle, she noticed decrease sensitivity in the feet and hands and mild numbness.  She has loose stools but not quite diarrhea.  She has nausea during the first week.  She is not able to tolerate cherry flavored Zofran ODT as it makes her more nauseous.  She is continuing vitamin D supplements.  Energy and appetite levels are 50%.  Mild fatigue is stable.  REVIEW OF SYSTEMS:  Review of Systems  Constitutional: Positive for fatigue.  Respiratory: Positive for cough.   Cardiovascular: Positive for palpitations.  Gastrointestinal: Positive for nausea.  Neurological: Positive for numbness.  Psychiatric/Behavioral: Positive for sleep disturbance.  All other systems reviewed and are negative.    PAST MEDICAL/SURGICAL HISTORY:  Past Medical History:  Diagnosis Date  . Hyperlipidemia   . Multinodular goiter   . Prediabetes    Past Surgical History:  Procedure Laterality Date  . BIOPSY  12/07/2018   Procedure: BIOPSY;  Surgeon: Rogene Houston, MD;  Location: [facility];  Service:  Endoscopy;;  sigmoid colon  . COLON RESECTION N/A 01/09/2019   Procedure: LAPAROSCOPIC RIGHT HEMICOLECTOMY;  Surgeon: Virl Cagey, MD;  Location: [facility] ORS;  Service: General;  Laterality: N/A;  . COLONOSCOPY N/A  12/07/2018   Procedure: COLONOSCOPY;  Surgeon: Rogene Houston, MD;  Location: [facility];  Service: Endoscopy;  Laterality: N/A;  12:45  . DILATION AND CURETTAGE OF UTERUS  2006  . FNA thyroid  2011 and 2019   benign  . POLYPECTOMY  12/07/2018   Procedure: POLYPECTOMY;  Surgeon: Rogene Houston, MD;  Location: [facility];  Service: Endoscopy;;  splenic flexure (CS x1)  . PORTACATH PLACEMENT Left 02/15/2019   Procedure: INSERTION PORT-A-CATH;  Surgeon: Virl Cagey, MD;  Location: [facility] ORS;  Service: General;  Laterality: Left;  . TUBAL LIGATION  1992     SOCIAL HISTORY:  Social History   Socioeconomic History  . Marital status: Married    Spouse name: Not on file  . Number of children: 1  . Years of education: Not on file  . Highest education level: Not on file  Occupational History  . Occupation: [employer]  . Financial resource strain: Not on file  . Food insecurity    Worry: Not on file    Inability: Not on file  . Transportation needs    Medical: Not on file    Non-medical: Not on file  Tobacco Use  . Smoking status: Current Every Day Smoker    Packs/day: 0.75    Years: 45.00    Pack years: 33.75    Types: Cigarettes  . Smokeless tobacco: Never Used  Substance and Sexual Activity  . Alcohol use: Yes    Alcohol/week: 0.0 standard drinks    Comment: occasionally  . Drug use: No  . Sexual activity: Yes  Lifestyle  . Physical activity    Days per week: Not on file    Minutes per session: Not on file  . Stress: Not on file  Relationships  . Social Herbalist on phone: Not on file    Gets together: Not on file    Attends religious service: Not on file    Active member of club or organization: Not on file    Attends meetings of clubs or organizations: Not on file    Relationship status: Not on file  . Intimate partner violence    Fear of current or ex partner: Not on file    Emotionally abused: Not on file     Physically abused: Not on file    Forced sexual activity: Not on file  Other Topics Concern  . Not on file  Social History Narrative  . Not on file    FAMILY HISTORY:  Family History  Problem Relation Age of Onset  . Hypertension Mother        AAA  . Coronary artery disease Mother   . Hyperlipidemia Mother   . Cancer Mother        lung  . Hypertension Father   . Cancer Brother     CURRENT MEDICATIONS:  Outpatient Encounter Medications as of 07/25/2019  Medication Sig Note  . acetaminophen (TYLENOL) 500 MG tablet Take 1,000 mg by mouth every 6 (six) hours as needed for moderate pain.    Marland Kitchen ALPRAZolam (XANAX) 0.25 MG tablet Take one tablet at bedtime, as needed , for anxiety   . aspirin EC 81 MG tablet Take 1 tablet (  81 mg total) by mouth every evening.   . Calcium Carbonate-Vitamin D (CALCIUM 600+D) 600-400 MG-UNIT tablet Take 1 tablet by mouth 2 (two) times a week. Sunday and Wednesday evenings   . docusate sodium (COLACE) 100 MG capsule Take 1 capsule (100 mg total) by mouth 2 (two) times daily.   . ergocalciferol (VITAMIN D2) 1.25 MG (50000 UT) capsule Take 1 capsule (50,000 Units total) by mouth once a week.   . fluorouracil CALGB 44967 in sodium chloride 0.9 % 150 mL Inject into the vein 2 days. Every 14 days 02/02/2019: Has not started yet  . LEUCOVORIN CALCIUM IV Inject into the vein every 14 (fourteen) days. 02/02/2019: Has not started yet  . lidocaine-prilocaine (EMLA) cream Apply to port a cath site one hour prior to chemotherapy appointment to numb the skin over your port site. 02/02/2019: Has not started yet  . ondansetron (ZOFRAN) 4 MG tablet Take 1 tablet (4 mg total) by mouth every 8 (eight) hours as needed for nausea or vomiting.   . OXALIPLATIN IV Inject into the vein every 14 (fourteen) days. 02/02/2019: Has not started yet  . pravastatin (PRAVACHOL) 40 MG tablet TAKE 1 TABLET BY MOUTH  DAILY IN THE EVENING (Patient taking differently: Take 40 mg by mouth 4 (four)  times a week. )   . [DISCONTINUED] ondansetron (ZOFRAN-ODT) 4 MG disintegrating tablet Take 1 tablet (4 mg total) by mouth every 6 (six) hours as needed for nausea.    Facility-Administered Encounter Medications as of 07/25/2019  Medication  . sodium chloride flush (NS) 0.9 % injection 10 mL    ALLERGIES:  No Known Allergies   PHYSICAL EXAM:  ECOG Performance status: 1  Vitals:   07/25/19 0800  BP: 124/69  Pulse: 87  Resp: 18  Temp: 97.9 F (36.6 C)  SpO2: 100%   Filed Weights   07/25/19 0800  Weight: 120 lb 12.8 oz (54.8 kg)    Physical Exam Constitutional:      Appearance: Normal appearance.  Neck:     Musculoskeletal: Normal range of motion.  Cardiovascular:     Rate and Rhythm: Normal rate and regular rhythm.     Pulses: Normal pulses.     Heart sounds: Normal heart sounds.  Pulmonary:     Effort: Pulmonary effort is normal.     Breath sounds: Normal breath sounds.  Abdominal:     General: There is no distension.     Palpations: Abdomen is soft. There is no mass.  Musculoskeletal: Normal range of motion.  Skin:    General: Skin is warm and dry.  Neurological:     General: No focal deficit present.     Mental Status: She is alert and oriented to person, place, and time.  Psychiatric:        Mood and Affect: Mood normal.        Behavior: Behavior normal.      LABORATORY DATA:  I have reviewed the labs as listed.  CBC    Component Value Date/Time   WBC 3.4 (L) 07/25/2019 0758   RBC 3.76 (L) 07/25/2019 0758   HGB 11.2 (L) 07/25/2019 0758   HGB 13.5 08/18/2018 0758   HCT 34.7 (L) 07/25/2019 0758   HCT 37.2 08/18/2018 0758   PLT 96 (L) 07/25/2019 0758   PLT 259 08/18/2018 0758   MCV 92.3 07/25/2019 0758   MCV 104 (H) 08/18/2018 0758   MCH 29.8 07/25/2019 0758   MCHC 32.3 07/25/2019 0758   RDW  17.7 (H) 07/25/2019 0758   RDW 13.0 08/18/2018 0758   LYMPHSABS 1.1 07/25/2019 0758   LYMPHSABS 3.5 (H) 08/31/2017 0955   MONOABS 0.2 07/25/2019 0758    EOSABS 0.0 07/25/2019 0758   EOSABS 0.1 08/31/2017 0955   BASOSABS 0.0 07/25/2019 0758   BASOSABS 0.0 08/31/2017 0955   CMP Latest Ref Rng & Units 07/25/2019 07/11/2019 07/04/2019  Glucose 70 - 99 mg/dL 152(H) 122(H) 141(H)  BUN 8 - 23 mg/dL 13 7(L) 10  Creatinine 0.44 - 1.00 mg/dL 0.58 0.59 0.69  Sodium 135 - 145 mmol/L 139 139 140  Potassium 3.5 - 5.1 mmol/L 3.7 3.5 3.6  Chloride 98 - 111 mmol/L 105 106 108  CO2 22 - 32 mmol/L 24 25 23  Calcium 8.9 - 10.3 mg/dL 9.4 9.4 9.5  Total Protein 6.5 - 8.1 g/dL 7.2 7.1 7.0  Total Bilirubin 0.3 - 1.2 mg/dL 0.8 1.2 1.0  Alkaline Phos 38 - 126 U/L 110 104 120  AST 15 - 41 U/L 28 35 34  ALT 0 - 44 U/L 16 22 21   I have independently reviewed her scans.    ASSESSMENT & PLAN:   Malignant neoplasm of transverse colon (HCC) 1.  Stage III (PT4BPN1A) transverse colon adenocarcinoma: -Laparoscopic right colectomy on 01/09/2019, pathology showing 4 cm moderately differentiated invasive adenocarcinoma, grade 2, 1/25 lymph nodes positive, no tumor deposits, no perineural invasion.  Tumor extends through serosa into adherent omentum.  Margins negative. - PET scan on 01/31/2019 shows focal hypermetabolic activity identified in the ileocolic anastomosis with SUV of 9.  This could be related to postsurgical changes, but follow-up is needed. - 9 cycles of FOLFOX from 03/01/2019 through 07/11/2019 (oxaliplatin dose reduced to 68 mg/m2 starting cycle 5 secondary to laryngopharyngeal dysesthesia). -She complained of mild numbness/decreased sensitivity in her feet which is new.  She had it in her fingertips prior to this. - I have recommended cutting back on oxaliplatin dose to 60% starting cycle 10 today.  We reviewed her blood work.  Platelet count is slightly low at 96.  We may proceed with cycle 10 today. -She will come back in 2 weeks for follow-up.  2.  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  MSI was high by PCR.  BRAF testing was negative. -  MLH1 hyper methylation was present.  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  3.  Vitamin D deficiency: -Vitamin D level on 05/23/2019 was 14.8. - We will continue vitamin D 50,000 units weekly.  4.  Nausea: -She is taking Zofran ODT 4 mg every 6 hours as needed.  Apparently she does not like the cherry flavor which is causing her to feel nauseous. -We will change her Zofran to regular tablets 4 mg every 6 hours as needed.   Total time spent is 25 minutes with more than 50% of the time spent face-to-face discussing lab results, treatment plan and coordination of care.   Sreedhar Katragadda, MD  [facility] [phone]   

## 2019-07-25 NOTE — Patient Instructions (Signed)
[facility] Discharge Instructions for Patients Receiving Chemotherapy   Beginning January 23rd 2017 lab work for the Cancer Center will be done in the  Main lab at [facility] on 1st floor. If you have a lab appointment with the Cancer Center please come in thru the  Main Entrance and check in at the main information desk   Today you received the following chemotherapy agents Oxaliplatin,Leucovorin and 5FU. Follow-up as scheduled. Call clinic for any questions or concerns  To help prevent nausea and vomiting after your treatment, we encourage you to take your nausea medication   If you develop nausea and vomiting, or diarrhea that is not controlled by your medication, call the clinic.  The clinic phone number is [phone]. Office hours are Monday-Friday 8:30am-5:00pm.  BELOW ARE SYMPTOMS THAT SHOULD BE REPORTED IMMEDIATELY:  *FEVER GREATER THAN 101.0 F  *CHILLS WITH OR WITHOUT FEVER  NAUSEA AND VOMITING THAT IS NOT CONTROLLED WITH YOUR NAUSEA MEDICATION  *UNUSUAL SHORTNESS OF BREATH  *UNUSUAL BRUISING OR BLEEDING  TENDERNESS IN MOUTH AND THROAT WITH OR WITHOUT PRESENCE OF ULCERS  *URINARY PROBLEMS  *BOWEL PROBLEMS  UNUSUAL RASH Items with * indicate a potential emergency and should be followed up as soon as possible. If you have an emergency after office hours please contact your primary care physician or go to the nearest emergency department.  Please call the clinic during office hours if you have any questions or concerns.   You may also contact the Patient Navigator at [phone] should you have any questions or need assistance in obtaining follow up care.      Resources For Cancer Patients and their Caregivers ? American Cancer Society: Can assist with transportation, wigs, general needs, runs Look Good Feel Better.        1-888-227-6333 ? Cancer Care: Provides financial assistance, online support groups, medication/co-pay assistance.   1-800-813-HOPE (4673) ? [facility] Assists [place] cancer patients and their families through emotional , educational and financial support.  336-427-4357 ? [place] DSS Where to apply for food stamps, Medicaid and utility assistance. 336-342-1394 ? [facility]: Transportation to medical appointments. 336-347-2287 ? Social Security Administration: May apply for disability if have a Stage IV cancer. 336-342-7796 1-800-772-1213 ? [place] Aging, Disability and Transit Services: Assists with nutrition, care and transit needs. 336-349-2343         

## 2019-07-25 NOTE — Patient Instructions (Signed)
[facility] at [facility] Discharge Instructions  Labs drawn from portacath today   Thank you for choosing [facility] at [facility] to provide your oncology and hematology care.  To afford each patient quality time with our provider, please arrive at least 15 minutes before your scheduled appointment time.   If you have a lab appointment with the Cancer Center please come in thru the  Main Entrance and check in at the main information desk  You need to re-schedule your appointment should you arrive 10 or more minutes late.  We strive to give you quality time with our providers, and arriving late affects you and other patients whose appointments are after yours.  Also, if you no show three or more times for appointments you may be dismissed from the clinic at the providers discretion.     Again, thank you for choosing [facility].  Our hope is that these requests will decrease the amount of time that you wait before being seen by our physicians.       _____________________________________________________________  Should you have questions after your visit to [facility], please contact our office at [phone] between the hours of 8:00 a.m. and 4:30 p.m.  Voicemails left after 4:00 p.m. will not be returned until the following business day.  For prescription refill requests, have your pharmacy contact our office and allow 72 hours.    Cancer Center Support Programs:   > Cancer Support Group  2nd Tuesday of the month 1pm-2pm, Journey Room   

## 2019-07-27 ENCOUNTER — Other Ambulatory Visit: Payer: Self-pay

## 2019-07-27 ENCOUNTER — Inpatient Hospital Stay (HOSPITAL_COMMUNITY): Payer: 59

## 2019-07-27 VITALS — BP 128/83 | HR 84 | Temp 97.8°F | Resp 16

## 2019-07-27 DIAGNOSIS — Z5111 Encounter for antineoplastic chemotherapy: Secondary | ICD-10-CM | POA: Diagnosis not present

## 2019-07-27 DIAGNOSIS — C184 Malignant neoplasm of transverse colon: Secondary | ICD-10-CM

## 2019-07-27 MED ORDER — PEGFILGRASTIM INJECTION 6 MG/0.6ML ~~LOC~~
6.0000 mg | PREFILLED_SYRINGE | Freq: Once | SUBCUTANEOUS | Status: AC
  Administered 2019-07-27: 6 mg via SUBCUTANEOUS

## 2019-07-27 MED ORDER — SODIUM CHLORIDE 0.9% FLUSH
10.0000 mL | INTRAVENOUS | Status: DC | PRN
  Administered 2019-07-27: 10 mL
  Filled 2019-07-27: qty 10

## 2019-07-27 MED ORDER — HEPARIN SOD (PORK) LOCK FLUSH 100 UNIT/ML IV SOLN
500.0000 [IU] | Freq: Once | INTRAVENOUS | Status: AC | PRN
  Administered 2019-07-27: 500 [IU]

## 2019-08-08 ENCOUNTER — Inpatient Hospital Stay (HOSPITAL_BASED_OUTPATIENT_CLINIC_OR_DEPARTMENT_OTHER): Payer: 59 | Admitting: Hematology

## 2019-08-08 ENCOUNTER — Inpatient Hospital Stay (HOSPITAL_COMMUNITY): Payer: 59

## 2019-08-08 ENCOUNTER — Other Ambulatory Visit: Payer: Self-pay

## 2019-08-08 ENCOUNTER — Encounter (HOSPITAL_COMMUNITY): Payer: Self-pay | Admitting: Hematology

## 2019-08-08 VITALS — BP 144/79 | HR 65 | Temp 96.9°F | Resp 18

## 2019-08-08 DIAGNOSIS — C184 Malignant neoplasm of transverse colon: Secondary | ICD-10-CM

## 2019-08-08 DIAGNOSIS — Z5111 Encounter for antineoplastic chemotherapy: Secondary | ICD-10-CM | POA: Diagnosis not present

## 2019-08-08 LAB — COMPREHENSIVE METABOLIC PANEL
ALT: 18 U/L (ref 0–44)
AST: 31 U/L (ref 15–41)
Albumin: 4 g/dL (ref 3.5–5.0)
Alkaline Phosphatase: 116 U/L (ref 38–126)
Anion gap: 9 (ref 5–15)
BUN: 8 mg/dL (ref 8–23)
CO2: 24 mmol/L (ref 22–32)
Calcium: 9.5 mg/dL (ref 8.9–10.3)
Chloride: 104 mmol/L (ref 98–111)
Creatinine, Ser: 0.56 mg/dL (ref 0.44–1.00)
GFR calc Af Amer: >60 mL/min (ref >60)
GFR calc non Af Amer: >60 mL/min (ref >60)
Glucose, Bld: 117 mg/dL — ABNORMAL HIGH (ref 70–99)
Potassium: 3.7 mmol/L (ref 3.5–5.1)
Sodium: 137 mmol/L (ref 135–145)
Total Bilirubin: 1.1 mg/dL (ref 0.3–1.2)
Total Protein: 7.4 g/dL (ref 6.5–8.1)

## 2019-08-08 LAB — CBC WITH DIFFERENTIAL/PLATELET
Abs Immature Granulocytes: 0.03 10*3/uL (ref 0.00–0.07)
Basophils Absolute: 0 10*3/uL (ref 0.0–0.1)
Basophils Relative: 1 %
Eosinophils Absolute: 0 10*3/uL (ref 0.0–0.5)
Eosinophils Relative: 1 %
HCT: 35 % — ABNORMAL LOW (ref 36.0–46.0)
Hemoglobin: 11.3 g/dL — ABNORMAL LOW (ref 12.0–15.0)
Immature Granulocytes: 1 %
Lymphocytes Relative: 29 %
Lymphs Abs: 1.2 10*3/uL (ref 0.7–4.0)
MCH: 30.2 pg (ref 26.0–34.0)
MCHC: 32.3 g/dL (ref 30.0–36.0)
MCV: 93.6 fL (ref 80.0–100.0)
Monocytes Absolute: 0.4 10*3/uL (ref 0.1–1.0)
Monocytes Relative: 9 %
Neutro Abs: 2.5 10*3/uL (ref 1.7–7.7)
Neutrophils Relative %: 59 %
Platelets: 79 10*3/uL — ABNORMAL LOW (ref 150–400)
RBC: 3.74 MIL/uL — ABNORMAL LOW (ref 3.87–5.11)
RDW: 17.5 % — ABNORMAL HIGH (ref 11.5–15.5)
WBC: 4.2 10*3/uL (ref 4.0–10.5)
nRBC: 0 % (ref 0.0–0.2)

## 2019-08-08 MED ORDER — SODIUM CHLORIDE 0.9% FLUSH
10.0000 mL | INTRAVENOUS | Status: DC | PRN
  Administered 2019-08-08: 10 mL
  Filled 2019-08-08: qty 10

## 2019-08-08 MED ORDER — PALONOSETRON HCL INJECTION 0.25 MG/5ML
0.2500 mg | Freq: Once | INTRAVENOUS | Status: AC
  Administered 2019-08-08: 10:00:00 0.25 mg via INTRAVENOUS
  Filled 2019-08-08: qty 5

## 2019-08-08 MED ORDER — FLUOROURACIL CHEMO INJECTION 2.5 GM/50ML
400.0000 mg/m2 | Freq: Once | INTRAVENOUS | Status: AC
  Administered 2019-08-08: 12:00:00 650 mg via INTRAVENOUS
  Filled 2019-08-08: qty 13

## 2019-08-08 MED ORDER — SODIUM CHLORIDE 0.9 % IV SOLN
2400.0000 mg/m2 | INTRAVENOUS | Status: DC
  Administered 2019-08-08: 3850 mg via INTRAVENOUS
  Filled 2019-08-08: qty 77

## 2019-08-08 MED ORDER — SODIUM CHLORIDE 0.9 % IV SOLN
10.0000 mg | Freq: Once | INTRAVENOUS | Status: AC
  Administered 2019-08-08: 10 mg via INTRAVENOUS
  Filled 2019-08-08: qty 10

## 2019-08-08 MED ORDER — SODIUM CHLORIDE 0.9 % IV SOLN
400.0000 mg/m2 | Freq: Once | INTRAVENOUS | Status: AC
  Administered 2019-08-08: 640 mg via INTRAVENOUS
  Filled 2019-08-08: qty 32

## 2019-08-08 MED ORDER — SODIUM CHLORIDE 0.9 % IV SOLN
INTRAVENOUS | Status: DC
  Administered 2019-08-08: 10:00:00 via INTRAVENOUS

## 2019-08-08 NOTE — Progress Notes (Signed)
Labs reviewed with MD today at office visit. MD noted platelets are 79,000. Will also hold oxaliplatin today due to numbness and tingling per MD. Proceed per MD.  Treatment given per orders. Patient tolerated it well without problems. Vitals stable and discharged home from clinic ambulatory. Follow up as scheduled.

## 2019-08-08 NOTE — Patient Instructions (Addendum)
[facility] at [facility] Discharge Instructions  You were seen today by Dr. Katragadda. He went over your recent lab results. He will see you back in 2 weeks for labs and follow up.   Thank you for choosing [facility] at [facility] to provide your oncology and hematology care.  To afford each patient quality time with our provider, please arrive at least 15 minutes before your scheduled appointment time.   If you have a lab appointment with the Cancer Center please come in thru the  Main Entrance and check in at the main information desk  You need to re-schedule your appointment should you arrive 10 or more minutes late.  We strive to give you quality time with our providers, and arriving late affects you and other patients whose appointments are after yours.  Also, if you no show three or more times for appointments you may be dismissed from the clinic at the providers discretion.     Again, thank you for choosing [facility].  Our hope is that these requests will decrease the amount of time that you wait before being seen by our physicians.       _____________________________________________________________  Should you have questions after your visit to [facility], please contact our office at [phone] between the hours of 8:00 a.m. and 4:30 p.m.  Voicemails left after 4:00 p.m. will not be returned until the following business day.  For prescription refill requests, have your pharmacy contact our office and allow 72 hours.    Cancer Center Support Programs:   > Cancer Support Group  2nd Tuesday of the month 1pm-2pm, Journey Room    

## 2019-08-08 NOTE — Progress Notes (Signed)
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:  CURRENT THERAPY: FOLFOX  BRIEF ONCOLOGIC HISTORY:  Oncology History  Colon cancer ([place])  01/09/2019 Initial Diagnosis   Colon cancer ([place])   03/01/2019 -  Chemotherapy   The patient had palonosetron (ALOXI) injection 0.25 mg, 0.25 mg, Intravenous,  Once, 10 of 12 cycles Administration: 0.25 mg (03/01/2019), 0.25 mg (03/15/2019), 0.25 mg (03/27/2019), 0.25 mg (04/10/2019), 0.25 mg (05/09/2019), 0.25 mg (05/23/2019), 0.25 mg (06/06/2019), 0.25 mg (06/20/2019), 0.25 mg (07/25/2019), 0.25 mg (07/11/2019) pegfilgrastim (NEULASTA) injection 6 mg, 6 mg, Subcutaneous, Once, 6 of 8 cycles Administration: 6 mg (05/11/2019), 6 mg (05/25/2019), 6 mg (06/08/2019), 6 mg (06/22/2019), 6 mg (07/13/2019) leucovorin 600 mg in dextrose 5 % 250 mL infusion, 640 mg, Intravenous,  Once, 10 of 12 cycles Administration: 600 mg (03/01/2019), 600 mg (03/15/2019), 640 mg (03/27/2019), 640 mg (04/10/2019), 640 mg (05/09/2019), 640 mg (05/23/2019), 640 mg (06/06/2019), 640 mg (06/20/2019), 640 mg (07/25/2019), 640 mg (07/11/2019) oxaliplatin (ELOXATIN) 135 mg in dextrose 5 % 500 mL chemo infusion, 85 mg/m2 = 135 mg, Intravenous,  Once, 10 of 12 cycles Dose modification: 68 mg/m2 (80 % of original dose 85 mg/m2, Cycle 5, Reason: Provider Judgment), 68 mg/m2 (original dose 85 mg/m2, Cycle 6, Reason: Provider Judgment), 51 mg/m2 (60 % of original dose 85 mg/m2, Cycle 10, Reason: Other (see comments), Comment: neuropathy) Administration: 135 mg (03/01/2019), 135 mg (03/15/2019), 135 mg (03/27/2019), 135 mg (04/10/2019), 110 mg (05/09/2019), 110 mg (05/23/2019), 110 mg (06/06/2019), 110 mg (06/20/2019), 80 mg (07/25/2019), 110 mg (07/11/2019) fluorouracil (ADRUCIL) chemo  injection 650 mg, 400 mg/m2 = 650 mg, Intravenous,  Once, 10 of 12 cycles Administration: 650 mg (03/01/2019), 650 mg (03/15/2019), 650 mg (03/27/2019), 650 mg (04/10/2019), 650 mg (05/09/2019), 650 mg (05/23/2019), 650 mg (06/06/2019), 650 mg (06/20/2019), 650 mg (07/25/2019), 650 mg (07/11/2019) fluorouracil (ADRUCIL) 3,850 mg in sodium chloride 0.9 % 73 mL chemo infusion, 2,400 mg/m2 = 3,850 mg, Intravenous, 1 Day/Dose, 10 of 12 cycles Administration: 3,850 mg (03/01/2019), 3,850 mg (03/15/2019), 3,850 mg (03/27/2019), 3,850 mg (04/10/2019), 3,850 mg (05/09/2019), 3,850 mg (05/23/2019), 3,850 mg (06/06/2019), 3,850 mg (06/20/2019), 3,850 mg (07/25/2019), 3,850 mg (07/11/2019)  for chemotherapy treatment.        INTERVAL HISTORY:  Ms. Cleckley 63 y.o. female seen for follow-up of cycle 11 of chemotherapy.  She reported continuous numbness in the fingertips and feet.  Denies any burning pain.  Denies dropping things.  Appetite and energy levels are 50%.  Mild fatigue is been stable.  Sleep problems are also stable.  She is taking Zofran as needed for nausea.  Denies any vomiting.  No diarrhea or constipation reported.  REVIEW OF SYSTEMS:  Review of Systems  Constitutional: Positive for fatigue.  Respiratory: Positive for cough.   Neurological: Positive for numbness.  Psychiatric/Behavioral: Positive for sleep disturbance.  All other systems reviewed and are negative.    PAST MEDICAL/SURGICAL HISTORY:  Past Medical History:  Diagnosis Date  . Hyperlipidemia   . Multinodular goiter   . Prediabetes    Past Surgical History:  Procedure Laterality Date  . BIOPSY  12/07/2018   Procedure: BIOPSY;  Surgeon: Rogene Houston, MD;  Location: [facility];  Service: Endoscopy;;  sigmoid colon  . COLON  RESECTION N/A 01/09/2019   Procedure: LAPAROSCOPIC RIGHT HEMICOLECTOMY;  Surgeon: Virl Cagey, MD;  Location: [facility];  Service: General;  Laterality: N/A;  . COLONOSCOPY N/A 12/07/2018   Procedure: COLONOSCOPY;   Surgeon: Rogene Houston, MD;  Location: [facility];  Service: Endoscopy;  Laterality: N/A;  12:45  . DILATION AND CURETTAGE OF UTERUS  2006  . FNA thyroid  2011 and 2019   benign  . POLYPECTOMY  12/07/2018   Procedure: POLYPECTOMY;  Surgeon: Rogene Houston, MD;  Location: [facility];  Service: Endoscopy;;  splenic flexure (CS x1)  . PORTACATH PLACEMENT Left 02/15/2019   Procedure: INSERTION PORT-A-CATH;  Surgeon: Virl Cagey, MD;  Location: [facility];  Service: General;  Laterality: Left;  . TUBAL LIGATION  1992     SOCIAL HISTORY:  Social History   Socioeconomic History  . Marital status: Married    Spouse name: Not on file  . Number of children: 1  . Years of education: Not on file  . Highest education level: Not on file  Occupational History  . Occupation: [employer]  . Financial resource strain: Not on file  . Food insecurity    Worry: Not on file    Inability: Not on file  . Transportation needs    Medical: Not on file    Non-medical: Not on file  Tobacco Use  . Smoking status: Current Every Day Smoker    Packs/day: 0.75    Years: 45.00    Pack years: 33.75    Types: Cigarettes  . Smokeless tobacco: Never Used  Substance and Sexual Activity  . Alcohol use: Yes    Alcohol/week: 0.0 standard drinks    Comment: occasionally  . Drug use: No  . Sexual activity: Yes  Lifestyle  . Physical activity    Days per week: Not on file    Minutes per session: Not on file  . Stress: Not on file  Relationships  . Social Herbalist on phone: Not on file    Gets together: Not on file    Attends religious service: Not on file    Active member of club or organization: Not on file    Attends meetings of clubs or organizations: Not on file    Relationship status: Not on file  . Intimate partner violence    Fear of current or ex partner: Not on file    Emotionally abused: Not on file    Physically abused: Not on file     Forced sexual activity: Not on file  Other Topics Concern  . Not on file  Social History Narrative  . Not on file    FAMILY HISTORY:  Family History  Problem Relation Age of Onset  . Hypertension Mother        AAA  . Coronary artery disease Mother   . Hyperlipidemia Mother   . Cancer Mother        lung  . Hypertension Father   . Cancer Brother     CURRENT MEDICATIONS:  Outpatient Encounter Medications as of 08/08/2019  Medication Sig  . acetaminophen (TYLENOL) 500 MG tablet Take 1,000 mg by mouth every 6 (six) hours as needed for moderate pain.   Marland Kitchen ALPRAZolam (XANAX) 0.25 MG tablet Take one tablet at bedtime, as needed , for anxiety  . aspirin EC 81 MG tablet Take 1 tablet (81 mg total) by mouth every evening.  . Calcium  Carbonate-Vitamin D (CALCIUM 600+D) 600-400 MG-UNIT tablet Take 1 tablet by mouth 2 (two) times a week. Sunday and Wednesday evenings  . docusate sodium (COLACE) 100 MG capsule Take 1 capsule (100 mg total) by mouth 2 (two) times daily.  . ergocalciferol (VITAMIN D2) 1.25 MG (50000 UT) capsule Take 1 capsule (50,000 Units total) by mouth once a week.  . fluorouracil CALGB 10626 in sodium chloride 0.9 % 150 mL Inject into the vein 2 days. Every 14 days  . LEUCOVORIN CALCIUM IV Inject into the vein every 14 (fourteen) days.  Marland Kitchen lidocaine-prilocaine (EMLA) cream Apply to port a cath site one hour prior to chemotherapy appointment to numb the skin over your port site.  . ondansetron (ZOFRAN) 4 MG tablet Take 1 tablet (4 mg total) by mouth every 8 (eight) hours as needed for nausea or vomiting.  . OXALIPLATIN IV Inject into the vein every 14 (fourteen) days.  . pravastatin (PRAVACHOL) 40 MG tablet TAKE 1 TABLET BY MOUTH IN  THE EVENING   Facility-Administered Encounter Medications as of 08/08/2019  Medication  . sodium chloride flush (NS) 0.9 % injection 10 mL    ALLERGIES:  No Known Allergies   PHYSICAL EXAM:  ECOG Performance status: 1  Vitals:   08/08/19  0800  BP: 130/75  Pulse: 87  Resp: 16  Temp: 97.9 F (36.6 C)  SpO2: 100%   Filed Weights   08/08/19 0800  Weight: 123 lb (55.8 kg)    Physical Exam Constitutional:      Appearance: Normal appearance.  Neck:     Musculoskeletal: Normal range of motion.  Cardiovascular:     Rate and Rhythm: Normal rate and regular rhythm.     Pulses: Normal pulses.     Heart sounds: Normal heart sounds.  Pulmonary:     Effort: Pulmonary effort is normal.     Breath sounds: Normal breath sounds.  Abdominal:     General: There is no distension.     Palpations: Abdomen is soft. There is no mass.  Musculoskeletal: Normal range of motion.  Skin:    General: Skin is warm and dry.  Neurological:     General: No focal deficit present.     Mental Status: She is alert and oriented to person, place, and time.  Psychiatric:        Mood and Affect: Mood normal.        Behavior: Behavior normal.      LABORATORY DATA:  I have reviewed the labs as listed.  CBC    Component Value Date/Time   WBC 4.2 08/08/2019 0806   RBC 3.74 (L) 08/08/2019 0806   HGB 11.3 (L) 08/08/2019 0806   HGB 13.5 08/18/2018 0758   HCT 35.0 (L) 08/08/2019 0806   HCT 37.2 08/18/2018 0758   PLT 79 (L) 08/08/2019 0806   PLT 259 08/18/2018 0758   MCV 93.6 08/08/2019 0806   MCV 104 (H) 08/18/2018 0758   MCH 30.2 08/08/2019 0806   MCHC 32.3 08/08/2019 0806   RDW 17.5 (H) 08/08/2019 0806   RDW 13.0 08/18/2018 0758   LYMPHSABS 1.2 08/08/2019 0806   LYMPHSABS 3.5 (H) 08/31/2017 0955   MONOABS 0.4 08/08/2019 0806   EOSABS 0.0 08/08/2019 0806   EOSABS 0.1 08/31/2017 0955   BASOSABS 0.0 08/08/2019 0806   BASOSABS 0.0 08/31/2017 0955   CMP Latest Ref Rng & Units 08/08/2019 07/25/2019 07/11/2019  Glucose 70 - 99 mg/dL 117(H) 152(H) 122(H)  BUN 8 - 23 mg/dL 8  13 7(L)  Creatinine 0.44 - 1.00 mg/dL 0.56 0.58 0.59  Sodium 135 - 145 mmol/L 137 139 139  Potassium 3.5 - 5.1 mmol/L 3.7 3.7 3.5  Chloride 98 - 111 mmol/L 104 105 106   CO2 22 - 32 mmol/L 24 24 25   Calcium 8.9 - 10.3 mg/dL 9.5 9.4 9.4  Total Protein 6.5 - 8.1 g/dL 7.4 7.2 7.1  Total Bilirubin 0.3 - 1.2 mg/dL 1.1 0.8 1.2  Alkaline Phos 38 - 126 U/L 116 110 104  AST 15 - 41 U/L 31 28 35  ALT 0 - 44 U/L 18 16 22    I have independently reviewed her scans.    ASSESSMENT & PLAN:   No problem-specific Assessment & Plan notes found for this encounter.   Total time spent is 25 minutes with more than 50% of the time spent face-to-face discussing lab results, treatment plan and coordination of care.   Donia Ast, [place] [phone]

## 2019-08-08 NOTE — Assessment & Plan Note (Signed)
1.  Stage III (PT4BPN1A) transverse colon adenocarcinoma: -Laparoscopic right colectomy on 01/09/2019, pathology showing 4 cm moderately differentiated invasive adenocarcinoma, grade 2, 1/25 lymph nodes positive, no tumor deposits, no perineural invasion.  Tumor extends through serosa into adherent omentum.  Margins negative. - PET scan on 01/31/2019 shows focal hypermetabolic activity identified in the ileocolic anastomosis with SUV of 9.  This could be related to postsurgical changes, but follow-up is needed. - 10 cycles of FOLFOX from 03/01/2019 through 07/25/2019 (oxaliplatin dose reduced to 68 mg per metered square starting cycle 5 secondary to laryngopharyngeal dysesthesia:)  -She has experienced continuous numbness in the fingertips and feet.  She is not having any difficulty doing day-to-day activities including putting buttons, tying shoes, using her cell phone.  She is not dropping things. - As her neuropathy has become continuous and she already received 10 cycles of oxaliplatin, I will discontinue oxaliplatin today. -I will reevaluate her in 2 weeks prior to her last cycle of chemotherapy.  2.  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  MSI was high by PCR.  BRAF testing was negative. - MLH1 hyper methylation was present.  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  3.  Vitamin D deficiency: -Vitamin D level on 05/23/2019 was 14.8. - We will continue vitamin D 50,000 units weekly.  4.  Nausea: -She will continue Zofran 4 mg every 6 hours as needed.Marland Kitchen

## 2019-08-08 NOTE — Patient Instructions (Signed)
[facility] Discharge Instructions for Patients Receiving Chemotherapy  Today you received the following chemotherapy agents   To help prevent nausea and vomiting after your treatment, we encourage you to take your nausea medication   If you develop nausea and vomiting that is not controlled by your nausea medication, call the clinic.   BELOW ARE SYMPTOMS THAT SHOULD BE REPORTED IMMEDIATELY:  *FEVER GREATER THAN 100.5 F  *CHILLS WITH OR WITHOUT FEVER  NAUSEA AND VOMITING THAT IS NOT CONTROLLED WITH YOUR NAUSEA MEDICATION  *UNUSUAL SHORTNESS OF BREATH  *UNUSUAL BRUISING OR BLEEDING  TENDERNESS IN MOUTH AND THROAT WITH OR WITHOUT PRESENCE OF ULCERS  *URINARY PROBLEMS  *BOWEL PROBLEMS  UNUSUAL RASH Items with * indicate a potential emergency and should be followed up as soon as possible.  Feel free to call the clinic should you have any questions or concerns. The clinic phone number is [phone].  Please show the CHEMO ALERT CARD at check-in to the Emergency Department and triage nurse.   

## 2019-08-10 ENCOUNTER — Inpatient Hospital Stay (HOSPITAL_COMMUNITY): Payer: 59

## 2019-08-10 ENCOUNTER — Other Ambulatory Visit: Payer: Self-pay

## 2019-08-10 VITALS — BP 122/74 | HR 64 | Temp 96.8°F | Resp 18

## 2019-08-10 DIAGNOSIS — Z5111 Encounter for antineoplastic chemotherapy: Secondary | ICD-10-CM | POA: Diagnosis not present

## 2019-08-10 DIAGNOSIS — C184 Malignant neoplasm of transverse colon: Secondary | ICD-10-CM

## 2019-08-10 MED ORDER — PEGFILGRASTIM INJECTION 6 MG/0.6ML ~~LOC~~
6.0000 mg | PREFILLED_SYRINGE | Freq: Once | SUBCUTANEOUS | Status: AC
  Administered 2019-08-10: 6 mg via SUBCUTANEOUS

## 2019-08-10 MED ORDER — SODIUM CHLORIDE 0.9% FLUSH
10.0000 mL | INTRAVENOUS | Status: DC | PRN
  Administered 2019-08-10: 11:00:00 10 mL
  Filled 2019-08-10: qty 10

## 2019-08-10 MED ORDER — HEPARIN SOD (PORK) LOCK FLUSH 100 UNIT/ML IV SOLN
500.0000 [IU] | Freq: Once | INTRAVENOUS | Status: AC | PRN
  Administered 2019-08-10: 500 [IU]

## 2019-08-10 NOTE — Patient Instructions (Signed)
[facility]  Discharge Instructions:   _______________________________________________________________  Thank you for choosing [facility] at [facility] to provide your oncology and hematology care.  To afford each patient quality time with our providers, please arrive at least 15 minutes before your scheduled appointment.  You need to re-schedule your appointment if you arrive 10 or more minutes late.  We strive to give you quality time with our providers, and arriving late affects you and other patients whose appointments are after yours.  Also, if you no show three or more times for appointments you may be dismissed from the clinic.  Again, thank you for choosing  [facility]. Our hope is that these requests will allow you access to exceptional care and in a timely manner. _______________________________________________________________  If you have questions after your visit, please contact our office at [phone] between the hours of 8:30 a.m. and 5:00 p.m. Voicemails left after 4:30 p.m. will not be returned until the following business day. _______________________________________________________________  For prescription refill requests, have your pharmacy contact our office. _______________________________________________________________  Recommendations made by the consultant and any test results will be sent to your referring physician. _______________________________________________________________ 

## 2019-08-22 ENCOUNTER — Other Ambulatory Visit: Payer: Self-pay

## 2019-08-22 ENCOUNTER — Encounter (HOSPITAL_COMMUNITY): Payer: Self-pay | Admitting: Hematology

## 2019-08-22 ENCOUNTER — Inpatient Hospital Stay (HOSPITAL_COMMUNITY): Payer: 59 | Attending: Hematology

## 2019-08-22 ENCOUNTER — Inpatient Hospital Stay (HOSPITAL_BASED_OUTPATIENT_CLINIC_OR_DEPARTMENT_OTHER): Payer: 59 | Admitting: Hematology

## 2019-08-22 ENCOUNTER — Inpatient Hospital Stay (HOSPITAL_COMMUNITY): Payer: 59

## 2019-08-22 VITALS — BP 149/67 | HR 66 | Temp 97.3°F | Resp 16 | Wt 123.2 lb

## 2019-08-22 DIAGNOSIS — F17210 Nicotine dependence, cigarettes, uncomplicated: Secondary | ICD-10-CM | POA: Insufficient documentation

## 2019-08-22 DIAGNOSIS — Z5111 Encounter for antineoplastic chemotherapy: Secondary | ICD-10-CM | POA: Diagnosis present

## 2019-08-22 DIAGNOSIS — Z801 Family history of malignant neoplasm of trachea, bronchus and lung: Secondary | ICD-10-CM | POA: Insufficient documentation

## 2019-08-22 DIAGNOSIS — K5900 Constipation, unspecified: Secondary | ICD-10-CM | POA: Insufficient documentation

## 2019-08-22 DIAGNOSIS — Z83438 Family history of other disorder of lipoprotein metabolism and other lipidemia: Secondary | ICD-10-CM | POA: Diagnosis not present

## 2019-08-22 DIAGNOSIS — G629 Polyneuropathy, unspecified: Secondary | ICD-10-CM | POA: Insufficient documentation

## 2019-08-22 DIAGNOSIS — Z809 Family history of malignant neoplasm, unspecified: Secondary | ICD-10-CM | POA: Insufficient documentation

## 2019-08-22 DIAGNOSIS — R200 Anesthesia of skin: Secondary | ICD-10-CM | POA: Diagnosis not present

## 2019-08-22 DIAGNOSIS — Z8249 Family history of ischemic heart disease and other diseases of the circulatory system: Secondary | ICD-10-CM | POA: Diagnosis not present

## 2019-08-22 DIAGNOSIS — C184 Malignant neoplasm of transverse colon: Secondary | ICD-10-CM

## 2019-08-22 DIAGNOSIS — Z5189 Encounter for other specified aftercare: Secondary | ICD-10-CM | POA: Insufficient documentation

## 2019-08-22 DIAGNOSIS — E559 Vitamin D deficiency, unspecified: Secondary | ICD-10-CM | POA: Diagnosis not present

## 2019-08-22 LAB — CBC WITH DIFFERENTIAL/PLATELET
Abs Immature Granulocytes: 0.01 10*3/uL (ref 0.00–0.07)
Basophils Absolute: 0 10*3/uL (ref 0.0–0.1)
Basophils Relative: 1 %
Eosinophils Absolute: 0 10*3/uL (ref 0.0–0.5)
Eosinophils Relative: 1 %
HCT: 35.7 % — ABNORMAL LOW (ref 36.0–46.0)
Hemoglobin: 11.4 g/dL — ABNORMAL LOW (ref 12.0–15.0)
Immature Granulocytes: 0 %
Lymphocytes Relative: 29 %
Lymphs Abs: 1 10*3/uL (ref 0.7–4.0)
MCH: 30.8 pg (ref 26.0–34.0)
MCHC: 31.9 g/dL (ref 30.0–36.0)
MCV: 96.5 fL (ref 80.0–100.0)
Monocytes Absolute: 0.2 10*3/uL (ref 0.1–1.0)
Monocytes Relative: 6 %
Neutro Abs: 2.2 10*3/uL (ref 1.7–7.7)
Neutrophils Relative %: 63 %
Platelets: 81 10*3/uL — ABNORMAL LOW (ref 150–400)
RBC: 3.7 MIL/uL — ABNORMAL LOW (ref 3.87–5.11)
RDW: 17.2 % — ABNORMAL HIGH (ref 11.5–15.5)
WBC: 3.5 10*3/uL — ABNORMAL LOW (ref 4.0–10.5)
nRBC: 0 % (ref 0.0–0.2)

## 2019-08-22 LAB — COMPREHENSIVE METABOLIC PANEL
ALT: 19 U/L (ref 0–44)
AST: 32 U/L (ref 15–41)
Albumin: 4.1 g/dL (ref 3.5–5.0)
Alkaline Phosphatase: 115 U/L (ref 38–126)
Anion gap: 11 (ref 5–15)
BUN: 10 mg/dL (ref 8–23)
CO2: 22 mmol/L (ref 22–32)
Calcium: 9.4 mg/dL (ref 8.9–10.3)
Chloride: 105 mmol/L (ref 98–111)
Creatinine, Ser: 0.66 mg/dL (ref 0.44–1.00)
GFR calc Af Amer: >60 mL/min (ref >60)
GFR calc non Af Amer: >60 mL/min (ref >60)
Glucose, Bld: 164 mg/dL — ABNORMAL HIGH (ref 70–99)
Potassium: 3.2 mmol/L — ABNORMAL LOW (ref 3.5–5.1)
Sodium: 138 mmol/L (ref 135–145)
Total Bilirubin: 0.9 mg/dL (ref 0.3–1.2)
Total Protein: 7.2 g/dL (ref 6.5–8.1)

## 2019-08-22 MED ORDER — SODIUM CHLORIDE 0.9% FLUSH: 10.0000 mL | INTRAVENOUS | Status: DC | PRN

## 2019-08-22 MED ORDER — POTASSIUM CHLORIDE CRYS ER 20 MEQ PO TBCR
40.0000 meq | EXTENDED_RELEASE_TABLET | Freq: Once | ORAL | Status: AC
  Administered 2019-08-22: 09:00:00 40 meq via ORAL
  Filled 2019-08-22: qty 2

## 2019-08-22 MED ORDER — SODIUM CHLORIDE 0.9 % IV SOLN
INTRAVENOUS | Status: DC
  Administered 2019-08-22: 09:00:00 via INTRAVENOUS

## 2019-08-22 MED ORDER — SODIUM CHLORIDE 0.9 % IV SOLN
400.0000 mg/m2 | Freq: Once | INTRAVENOUS | Status: AC
  Administered 2019-08-22: 640 mg via INTRAVENOUS
  Filled 2019-08-22: qty 32

## 2019-08-22 MED ORDER — PALONOSETRON HCL INJECTION 0.25 MG/5ML
0.2500 mg | Freq: Once | INTRAVENOUS | Status: AC
  Administered 2019-08-22: 0.25 mg via INTRAVENOUS
  Filled 2019-08-22: qty 5

## 2019-08-22 MED ORDER — FLUOROURACIL CHEMO INJECTION 2.5 GM/50ML
400.0000 mg/m2 | Freq: Once | INTRAVENOUS | Status: AC
  Administered 2019-08-22: 11:00:00 650 mg via INTRAVENOUS
  Filled 2019-08-22: qty 13

## 2019-08-22 MED ORDER — SODIUM CHLORIDE 0.9 % IV SOLN
10.0000 mg | Freq: Once | INTRAVENOUS | Status: AC
  Administered 2019-08-22: 09:00:00 10 mg via INTRAVENOUS
  Filled 2019-08-22: qty 10

## 2019-08-22 MED ORDER — SODIUM CHLORIDE 0.9 % IV SOLN
2400.0000 mg/m2 | INTRAVENOUS | Status: DC
  Administered 2019-08-22: 3850 mg via INTRAVENOUS
  Filled 2019-08-22: qty 77

## 2019-08-22 NOTE — Progress Notes (Signed)
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:  CURRENT THERAPY: FOLFOX  BRIEF ONCOLOGIC HISTORY:  Oncology History  Colon cancer (Andrea Simon)  01/09/2019 Initial Diagnosis   Colon cancer (Andrea Simon)   03/01/2019 -  Chemotherapy   The patient had palonosetron (ALOXI) injection 0.25 mg, 0.25 mg, Intravenous,  Once, 12 of 12 cycles Administration: 0.25 mg (03/01/2019), 0.25 mg (03/15/2019), 0.25 mg (03/27/2019), 0.25 mg (04/10/2019), 0.25 mg (05/09/2019), 0.25 mg (05/23/2019), 0.25 mg (06/06/2019), 0.25 mg (06/20/2019), 0.25 mg (07/25/2019), 0.25 mg (07/11/2019), 0.25 mg (08/08/2019), 0.25 mg (08/22/2019) pegfilgrastim (NEULASTA) injection 6 mg, 6 mg, Subcutaneous, Once, 8 of 8 cycles Administration: 6 mg (05/11/2019), 6 mg (05/25/2019), 6 mg (06/08/2019), 6 mg (06/22/2019), 6 mg (07/13/2019), 6 mg (07/27/2019), 6 mg (08/10/2019) leucovorin 600 mg in dextrose 5 % 250 mL infusion, 640 mg, Intravenous,  Once, 12 of 12 cycles Administration: 600 mg (03/01/2019), 600 mg (03/15/2019), 640 mg (03/27/2019), 640 mg (04/10/2019), 640 mg (05/09/2019), 640 mg (05/23/2019), 640 mg (06/06/2019), 640 mg (06/20/2019), 640 mg (07/25/2019), 640 mg (07/11/2019), 640 mg (08/08/2019), 640 mg (08/22/2019) oxaliplatin (ELOXATIN) 135 mg in dextrose 5 % 500 mL chemo infusion, 85 mg/m2 = 135 mg, Intravenous,  Once, 10 of 10 cycles Dose modification: 68 mg/m2 (80 % of original dose 85 mg/m2, Cycle 5, Reason: Provider Judgment), 68 mg/m2 (original dose 85 mg/m2, Cycle 6, Reason: Provider Judgment), 51 mg/m2 (60 % of original dose 85 mg/m2, Cycle 10, Reason: Other (see comments), Comment: neuropathy) Administration: 135 mg (03/01/2019), 135 mg (03/15/2019), 135 mg (03/27/2019), 135 mg (04/10/2019), 110 mg (05/09/2019), 110 mg (05/23/2019),  110 mg (06/06/2019), 110 mg (06/20/2019), 80 mg (07/25/2019), 110 mg (07/11/2019) fluorouracil (ADRUCIL) chemo injection 650 mg, 400 mg/m2 = 650 mg, Intravenous,  Once, 12 of 12 cycles Administration: 650 mg (03/01/2019), 650 mg (03/15/2019), 650 mg (03/27/2019), 650 mg (04/10/2019), 650 mg (05/09/2019), 650 mg (05/23/2019), 650 mg (06/06/2019), 650 mg (06/20/2019), 650 mg (07/25/2019), 650 mg (07/11/2019), 650 mg (08/08/2019), 650 mg (08/22/2019) fluorouracil (ADRUCIL) 3,850 mg in sodium chloride 0.9 % 73 mL chemo infusion, 2,400 mg/m2 = 3,850 mg, Intravenous, 1 Day/Dose, 12 of 12 cycles Administration: 3,850 mg (03/01/2019), 3,850 mg (03/15/2019), 3,850 mg (03/27/2019), 3,850 mg (04/10/2019), 3,850 mg (05/09/2019), 3,850 mg (05/23/2019), 3,850 mg (06/06/2019), 3,850 mg (06/20/2019), 3,850 mg (07/25/2019), 3,850 mg (07/11/2019), 3,850 mg (08/08/2019), 3,850 mg (08/22/2019)  for chemotherapy treatment.        INTERVAL HISTORY:  Andrea Simon 63 y.o. female seen for cycle 12 of chemotherapy.  She did well after cycle 11.  However her neuropathy in the fingertips and toes has slightly worsened.  Appetite is 100%.  Energy levels are 50%.  Constipation is under control.  Denies any fevers or night sweats.  Denies any nausea, vomiting or diarrhea.  REVIEW OF SYSTEMS:  Review of Systems  Gastrointestinal: Positive for constipation.  Neurological: Positive for numbness.  All other systems reviewed and are negative.    PAST MEDICAL/SURGICAL HISTORY:  Past Medical History:  Diagnosis Date  . Hyperlipidemia   . Multinodular goiter   . Prediabetes    Past Surgical History:  Procedure Laterality Date  . BIOPSY  12/07/2018   Procedure: BIOPSY;  Surgeon: Rogene Houston, MD;  [facility];  Service: Endoscopy;;  sigmoid colon  . COLON RESECTION N/A 01/09/2019   Procedure: LAPAROSCOPIC RIGHT HEMICOLECTOMY;  Surgeon: Virl Cagey, MD;  Location: [facility];  Service: General;  Laterality: N/A;  . COLONOSCOPY N/A 12/07/2018    Procedure: COLONOSCOPY;  Surgeon: Rogene Houston, MD;  Location: [facility];  Service: Endoscopy;  Laterality: N/A;  12:45  . DILATION AND CURETTAGE OF UTERUS  2006  . FNA thyroid  2011 and 2019   benign  . POLYPECTOMY  12/07/2018   Procedure: POLYPECTOMY;  Surgeon: Rogene Houston, MD;  Location: [facility];  Service: Endoscopy;;  splenic flexure (CS x1)  . PORTACATH PLACEMENT Left 02/15/2019   Procedure: INSERTION PORT-A-CATH;  Surgeon: Virl Cagey, MD;  Location: [facility];  Service: General;  Laterality: Left;  . TUBAL LIGATION  1992     SOCIAL HISTORY:  Social History   Socioeconomic History  . Marital status: Married    Spouse name: Not on file  . Number of children: 1  . Years of education: Not on file  . Highest education level: Not on file  Occupational History  . Occupation: [employer]  . Financial resource strain: Not on file  . Food insecurity    Worry: Not on file    Inability: Not on file  . Transportation needs    Medical: Not on file    Non-medical: Not on file  Tobacco Use  . Smoking status: Current Every Day Smoker    Packs/day: 0.75    Years: 45.00    Pack years: 33.75    Types: Cigarettes  . Smokeless tobacco: Never Used  Substance and Sexual Activity  . Alcohol use: Yes    Alcohol/week: 0.0 standard drinks    Comment: occasionally  . Drug use: No  . Sexual activity: Yes  Lifestyle  . Physical activity    Days per week: Not on file    Minutes per session: Not on file  . Stress: Not on file  Relationships  . Social Herbalist on phone: Not on file    Gets together: Not on file    Attends religious service: Not on file    Active member of club or organization: Not on file    Attends meetings of clubs or organizations: Not on file    Relationship status: Not on file  . Intimate partner violence    Fear of current or ex partner: Not on file    Emotionally abused: Not on file     Physically abused: Not on file    Forced sexual activity: Not on file  Other Topics Concern  . Not on file  Social History Narrative  . Not on file    FAMILY HISTORY:  Family History  Problem Relation Age of Onset  . Hypertension Mother        AAA  . Coronary artery disease Mother   . Hyperlipidemia Mother   . Cancer Mother        lung  . Hypertension Father   . Cancer Brother     CURRENT MEDICATIONS:  Outpatient Encounter Medications as of 08/22/2019  Medication Sig  . acetaminophen (TYLENOL) 500 MG tablet Take 1,000 mg by mouth every 6 (six) hours as needed for moderate pain.   Marland Kitchen ALPRAZolam (XANAX) 0.25 MG tablet Take one tablet at bedtime, as needed , for anxiety  . aspirin EC 81 MG tablet Take 1 tablet (81 mg total) by mouth  every evening.  . Calcium Carbonate-Vitamin D (CALCIUM 600+D) 600-400 MG-UNIT tablet Take 1 tablet by mouth 2 (two) times a week. Sunday and Wednesday evenings  . docusate sodium (COLACE) 100 MG capsule Take 1 capsule (100 mg total) by mouth 2 (two) times daily.  . ergocalciferol (VITAMIN D2) 1.25 MG (50000 UT) capsule Take 1 capsule (50,000 Units total) by mouth once a week.  . fluorouracil CALGB 09604 in sodium chloride 0.9 % 150 mL Inject into the vein 2 days. Every 14 days  . LEUCOVORIN CALCIUM IV Inject into the vein every 14 (fourteen) days.  Marland Kitchen lidocaine-prilocaine (EMLA) cream Apply to port a cath site one hour prior to chemotherapy appointment to numb the skin over your port site.  . ondansetron (ZOFRAN) 4 MG tablet Take 1 tablet (4 mg total) by mouth every 8 (eight) hours as needed for nausea or vomiting.  . OXALIPLATIN IV Inject into the vein every 14 (fourteen) days.  . pravastatin (PRAVACHOL) 40 MG tablet TAKE 1 TABLET BY MOUTH IN  THE EVENING   Facility-Administered Encounter Medications as of 08/22/2019  Medication  . sodium chloride flush (NS) 0.9 % injection 10 mL    ALLERGIES:  No Known Allergies   PHYSICAL EXAM:  ECOG Performance  status: 1  There were no vitals filed for this visit. There were no vitals filed for this visit.  Physical Exam Constitutional:      Appearance: Normal appearance.  Neck:     Musculoskeletal: Normal range of motion.  Cardiovascular:     Rate and Rhythm: Normal rate and regular rhythm.     Pulses: Normal pulses.     Heart sounds: Normal heart sounds.  Pulmonary:     Effort: Pulmonary effort is normal.     Breath sounds: Normal breath sounds.  Abdominal:     General: There is no distension.     Palpations: Abdomen is soft. There is no mass.  Musculoskeletal: Normal range of motion.  Skin:    General: Skin is warm and dry.  Neurological:     General: No focal deficit present.     Mental Status: She is alert and oriented to person, place, and time.  Psychiatric:        Mood and Affect: Mood normal.        Behavior: Behavior normal.      LABORATORY DATA:  I have reviewed the labs as listed.  CBC    Component Value Date/Time   WBC 3.5 (L) 08/22/2019 0756   RBC 3.70 (L) 08/22/2019 0756   HGB 11.4 (L) 08/22/2019 0756   HGB 13.5 08/18/2018 0758   HCT 35.7 (L) 08/22/2019 0756   HCT 37.2 08/18/2018 0758   PLT 81 (L) 08/22/2019 0756   PLT 259 08/18/2018 0758   MCV 96.5 08/22/2019 0756   MCV 104 (H) 08/18/2018 0758   MCH 30.8 08/22/2019 0756   MCHC 31.9 08/22/2019 0756   RDW 17.2 (H) 08/22/2019 0756   RDW 13.0 08/18/2018 0758   LYMPHSABS 1.0 08/22/2019 0756   LYMPHSABS 3.5 (H) 08/31/2017 0955   MONOABS 0.2 08/22/2019 0756   EOSABS 0.0 08/22/2019 0756   EOSABS 0.1 08/31/2017 0955   BASOSABS 0.0 08/22/2019 0756   BASOSABS 0.0 08/31/2017 0955   CMP Latest Ref Rng & Units 08/22/2019 08/08/2019 07/25/2019  Glucose 70 - 99 mg/dL 164(H) 117(H) 152(H)  BUN 8 - 23 mg/dL '10 8 13  ' Creatinine 0.44 - 1.00 mg/dL 0.66 0.56 0.58  Sodium 135 - 145 mmol/L  138 137 139  Potassium 3.5 - 5.1 mmol/L 3.2(L) 3.7 3.7  Chloride 98 - 111 mmol/L 105 104 105  CO2 22 - 32 mmol/L '22 24 24  ' Calcium  8.9 - 10.3 mg/dL 9.4 9.5 9.4  Total Protein 6.5 - 8.1 g/dL 7.2 7.4 7.2  Total Bilirubin 0.3 - 1.2 mg/dL 0.9 1.1 0.8  Alkaline Phos 38 - 126 U/L 115 116 110  AST 15 - 41 U/L 32 31 28  ALT 0 - 44 U/L '19 18 16   ' I have independently reviewed her scans.    ASSESSMENT & PLAN:   Malignant neoplasm of transverse colon ([place]) 1.  Stage III (PT 4 BPN 1A) transverse colon adenocarcinoma: - Laparoscopic right colectomy on 01/09/2019, 1/25 lymph nodes positive, no tumor deposits, no perineural invasion, grade 2, tumor extending through serosa into adherent omentum.  Margins negative. -PET scan on 01/31/2019 shows focal hypermetabolic activity identified in the ileocolic anastomosis with SUV of 9.  This could be related to postsurgical changes.  Follow-up as needed. - 11 cycles of FOLFOX from 03/01/2019 through 08/08/2019 (oxaliplatin discontinued after cycle 10). - She did well after last cycle without oxaliplatin. -We reviewed her blood work.  She will proceed with her next cycle without any changes.  I will continue to hold oxaliplatin. -We will see her back in 3 weeks for follow-up.  We will plan to repeat CT PET scan of the abdomen and pelvis as well as CEA level.  2.  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  MSI was high by PCR.Marland Kitchen  Testing was negative. -MLH1 hyper methylation was present.  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  3.  Peripheral neuropathy: -She has constant numbness in the fingertips and toes.  We have dose reduced oxaliplatin and discontinue it completely starting cycle 11. -If she has neuropathic pains, will consider adding gabapentin.  4.  Vitamin D deficiency: -Vitamin D level on 05/23/2019 was 14.8.  She is taking 50,000 units weekly. -We will consider rechecking it.   Total time spent is 25 minutes with more than 50% of the time spent face-to-face discussing lab results, treatment plan and coordination of care.   Derek Jack, MD  [place] [phone]

## 2019-08-22 NOTE — Patient Instructions (Signed)
[facility] at [facility] Discharge Instructions  Labs drawn from portacath today   Thank you for choosing [facility] at [facility] to provide your oncology and hematology care.  To afford each patient quality time with our provider, please arrive at least 15 minutes before your scheduled appointment time.   If you have a lab appointment with the Cancer Center please come in thru the Main Entrance and check in at the main information desk.  You need to re-schedule your appointment should you arrive 10 or more minutes late.  We strive to give you quality time with our providers, and arriving late affects you and other patients whose appointments are after yours.  Also, if you no show three or more times for appointments you may be dismissed from the clinic at the providers discretion.     Again, thank you for choosing [facility].  Our hope is that these requests will decrease the amount of time that you wait before being seen by our physicians.       _____________________________________________________________  Should you have questions after your visit to [facility], please contact our office at [phone] between the hours of 8:00 a.m. and 4:30 p.m.  Voicemails left after 4:00 p.m. will not be returned until the following business day.  For prescription refill requests, have your pharmacy contact our office and allow 72 hours.    Due to Covid, you will need to wear a mask upon entering the hospital. If you do not have a mask, a mask will be given to you at the Main Entrance upon arrival. For doctor visits, patients may have 1 support person with them. For treatment visits, patients can not have anyone with them due to social distancing guidelines and our immunocompromised population.     

## 2019-08-22 NOTE — Patient Instructions (Addendum)
[facility] at [facility] Discharge Instructions  You were seen today by Dr. Katragadda. He went over your recent lab results. He will see you back in 3 weeks for labs and follow up.   Thank you for choosing [facility] at [facility] to provide your oncology and hematology care.  To afford each patient quality time with our provider, please arrive at least 15 minutes before your scheduled appointment time.   If you have a lab appointment with the Cancer Center please come in thru the  Main Entrance and check in at the main information desk  You need to re-schedule your appointment should you arrive 10 or more minutes late.  We strive to give you quality time with our providers, and arriving late affects you and other patients whose appointments are after yours.  Also, if you no show three or more times for appointments you may be dismissed from the clinic at the providers discretion.     Again, thank you for choosing [facility].  Our hope is that these requests will decrease the amount of time that you wait before being seen by our physicians.       _____________________________________________________________  Should you have questions after your visit to [facility], please contact our office at [phone] between the hours of 8:00 a.m. and 4:30 p.m.  Voicemails left after 4:00 p.m. will not be returned until the following business day.  For prescription refill requests, have your pharmacy contact our office and allow 72 hours.    Cancer Center Support Programs:   > Cancer Support Group  2nd Tuesday of the month 1pm-2pm, Journey Room    

## 2019-08-22 NOTE — Assessment & Plan Note (Signed)
1.  Stage III (PT 4 BPN 1A) transverse colon adenocarcinoma: - Laparoscopic right colectomy on 01/09/2019, 1/25 lymph nodes positive, no tumor deposits, no perineural invasion, grade 2, tumor extending through serosa into adherent omentum.  Margins negative. -PET scan on 01/31/2019 shows focal hypermetabolic activity identified in the ileocolic anastomosis with SUV of 9.  This could be related to postsurgical changes.  Follow-up as needed. - 11 cycles of FOLFOX from 03/01/2019 through 08/08/2019 (oxaliplatin discontinued after cycle 10). - She did well after last cycle without oxaliplatin. -We reviewed her blood work.  She will proceed with her next cycle without any changes.  I will continue to hold oxaliplatin. -We will see her back in 3 weeks for follow-up.  We will plan to repeat CT PET scan of the abdomen and pelvis as well as CEA level.  2.  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  MSI was high by PCR.Marland Kitchen  Testing was negative. -MLH1 hyper methylation was present.  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  3.  Peripheral neuropathy: -She has constant numbness in the fingertips and toes.  We have dose reduced oxaliplatin and discontinue it completely starting cycle 11. -If she has neuropathic pains, will consider adding gabapentin.  4.  Vitamin D deficiency: -Vitamin D level on 05/23/2019 was 14.8.  She is taking 50,000 units weekly. -We will consider rechecking it.

## 2019-08-22 NOTE — Patient Instructions (Signed)
[facility] Discharge Instructions for Patients Receiving Chemotherapy  Today you received the following chemotherapy agents   To help prevent nausea and vomiting after your treatment, we encourage you to take your nausea medication   If you develop nausea and vomiting that is not controlled by your nausea medication, call the clinic.   BELOW ARE SYMPTOMS THAT SHOULD BE REPORTED IMMEDIATELY:  *FEVER GREATER THAN 100.5 F  *CHILLS WITH OR WITHOUT FEVER  NAUSEA AND VOMITING THAT IS NOT CONTROLLED WITH YOUR NAUSEA MEDICATION  *UNUSUAL SHORTNESS OF BREATH  *UNUSUAL BRUISING OR BLEEDING  TENDERNESS IN MOUTH AND THROAT WITH OR WITHOUT PRESENCE OF ULCERS  *URINARY PROBLEMS  *BOWEL PROBLEMS  UNUSUAL RASH Items with * indicate a potential emergency and should be followed up as soon as possible.  Feel free to call the clinic should you have any questions or concerns. The clinic phone number is [phone].  Please show the CHEMO ALERT CARD at check-in to the Emergency Department and triage nurse.   

## 2019-08-22 NOTE — Progress Notes (Signed)
Labs reviewed with MD today at office visit, platelets 81 and K+ 3.2. proceed with treatment per MD.

## 2019-08-24 ENCOUNTER — Inpatient Hospital Stay (HOSPITAL_COMMUNITY): Payer: 59

## 2019-08-24 ENCOUNTER — Encounter (HOSPITAL_COMMUNITY): Payer: 59

## 2019-08-24 ENCOUNTER — Other Ambulatory Visit: Payer: Self-pay

## 2019-08-24 VITALS — BP 141/69 | HR 76 | Temp 97.1°F | Resp 18

## 2019-08-24 DIAGNOSIS — C184 Malignant neoplasm of transverse colon: Secondary | ICD-10-CM

## 2019-08-24 DIAGNOSIS — Z5111 Encounter for antineoplastic chemotherapy: Secondary | ICD-10-CM | POA: Diagnosis not present

## 2019-08-24 MED ORDER — PEGFILGRASTIM INJECTION 6 MG/0.6ML ~~LOC~~
6.0000 mg | PREFILLED_SYRINGE | Freq: Once | SUBCUTANEOUS | Status: AC
  Administered 2019-08-24: 6 mg via SUBCUTANEOUS

## 2019-08-24 MED ORDER — PEGFILGRASTIM INJECTION 6 MG/0.6ML ~~LOC~~
PREFILLED_SYRINGE | SUBCUTANEOUS | Status: AC
  Filled 2019-08-24: qty 0.6

## 2019-08-24 MED ORDER — SODIUM CHLORIDE 0.9% FLUSH
10.0000 mL | INTRAVENOUS | Status: DC | PRN
  Administered 2019-08-24: 10 mL
  Filled 2019-08-24: qty 10

## 2019-08-24 MED ORDER — HEPARIN SOD (PORK) LOCK FLUSH 100 UNIT/ML IV SOLN
500.0000 [IU] | Freq: Once | INTRAVENOUS | Status: AC | PRN
  Administered 2019-08-24: 13:00:00 500 [IU]

## 2019-08-24 NOTE — Patient Instructions (Signed)
[facility] at [facility]  Discharge Instructions:   _______________________________________________________________  Thank you for choosing [facility] at [facility] to provide your oncology and hematology care.  To afford each patient quality time with our providers, please arrive at least 15 minutes before your scheduled appointment.  You need to re-schedule your appointment if you arrive 10 or more minutes late.  We strive to give you quality time with our providers, and arriving late affects you and other patients whose appointments are after yours.  Also, if you no show three or more times for appointments you may be dismissed from the clinic.  Again, thank you for choosing [facility] at [facility]. Our hope is that these requests will allow you access to exceptional care and in a timely manner. _______________________________________________________________  If you have questions after your visit, please contact our office at [phone] between the hours of 8:30 a.m. and 5:00 p.m. Voicemails left after 4:30 p.m. will not be returned until the following business day. _______________________________________________________________  For prescription refill requests, have your pharmacy contact our office. _______________________________________________________________  Recommendations made by the consultant and any test results will be sent to your referring physician. _______________________________________________________________ 

## 2019-08-24 NOTE — Progress Notes (Signed)
Andrea Simon presents today for 5FU pump discontinuation. Pump discontinued and port flushed with NS and heparin per protocol. Port deaccessed and bandaid applied. VSS. Discharged in satisfactory condition with follow up instructions.

## 2019-09-04 ENCOUNTER — Ambulatory Visit (HOSPITAL_COMMUNITY)
Admission: RE | Admit: 2019-09-04 | Discharge: 2019-09-04 | Disposition: A | Discharge status: Home / Self Care | Payer: 59 | Source: Ambulatory Visit | Attending: Family Medicine | Admitting: Family Medicine

## 2019-09-04 ENCOUNTER — Other Ambulatory Visit: Payer: Self-pay

## 2019-09-04 DIAGNOSIS — Z1231 Encounter for screening mammogram for malignant neoplasm of breast: Secondary | ICD-10-CM | POA: Insufficient documentation

## 2019-09-04 DIAGNOSIS — Z1239 Encounter for other screening for malignant neoplasm of breast: Secondary | ICD-10-CM

## 2019-09-12 ENCOUNTER — Inpatient Hospital Stay (HOSPITAL_COMMUNITY): Payer: 59

## 2019-09-12 ENCOUNTER — Other Ambulatory Visit (HOSPITAL_COMMUNITY): Payer: Self-pay | Admitting: *Deleted

## 2019-09-12 ENCOUNTER — Other Ambulatory Visit: Payer: Self-pay

## 2019-09-12 ENCOUNTER — Ambulatory Visit (HOSPITAL_COMMUNITY)
Admission: RE | Admit: 2019-09-12 | Discharge: 2019-09-12 | Disposition: A | Discharge status: Home / Self Care | Payer: 59 | Source: Ambulatory Visit | Attending: Hematology | Admitting: Hematology

## 2019-09-12 DIAGNOSIS — C184 Malignant neoplasm of transverse colon: Secondary | ICD-10-CM

## 2019-09-12 DIAGNOSIS — Z5111 Encounter for antineoplastic chemotherapy: Secondary | ICD-10-CM | POA: Diagnosis not present

## 2019-09-12 LAB — CBC WITH DIFFERENTIAL/PLATELET
Abs Immature Granulocytes: 0.01 10*3/uL (ref 0.00–0.07)
Basophils Absolute: 0 10*3/uL (ref 0.0–0.1)
Basophils Relative: 1 %
Eosinophils Absolute: 0 10*3/uL (ref 0.0–0.5)
Eosinophils Relative: 1 %
HCT: 41.7 % (ref 36.0–46.0)
Hemoglobin: 13.3 g/dL (ref 12.0–15.0)
Immature Granulocytes: 0 %
Lymphocytes Relative: 36 %
Lymphs Abs: 1.4 10*3/uL (ref 0.7–4.0)
MCH: 31.4 pg (ref 26.0–34.0)
MCHC: 31.9 g/dL (ref 30.0–36.0)
MCV: 98.6 fL (ref 80.0–100.0)
Monocytes Absolute: 0.3 10*3/uL (ref 0.1–1.0)
Monocytes Relative: 8 %
Neutro Abs: 2 10*3/uL (ref 1.7–7.7)
Neutrophils Relative %: 54 %
Platelets: 148 10*3/uL — ABNORMAL LOW (ref 150–400)
RBC: 4.23 MIL/uL (ref 3.87–5.11)
RDW: 16.7 % — ABNORMAL HIGH (ref 11.5–15.5)
WBC: 3.8 10*3/uL — ABNORMAL LOW (ref 4.0–10.5)
nRBC: 0 % (ref 0.0–0.2)

## 2019-09-12 LAB — COMPREHENSIVE METABOLIC PANEL
ALT: 20 U/L (ref 0–44)
AST: 32 U/L (ref 15–41)
Albumin: 4.7 g/dL (ref 3.5–5.0)
Alkaline Phosphatase: 120 U/L (ref 38–126)
Anion gap: 10 (ref 5–15)
BUN: 9 mg/dL (ref 8–23)
CO2: 26 mmol/L (ref 22–32)
Calcium: 10.1 mg/dL (ref 8.9–10.3)
Chloride: 104 mmol/L (ref 98–111)
Creatinine, Ser: 0.71 mg/dL (ref 0.44–1.00)
GFR calc Af Amer: >60 mL/min (ref >60)
GFR calc non Af Amer: >60 mL/min (ref >60)
Glucose, Bld: 99 mg/dL (ref 70–99)
Potassium: 3.8 mmol/L (ref 3.5–5.1)
Sodium: 140 mmol/L (ref 135–145)
Total Bilirubin: 1.7 mg/dL — ABNORMAL HIGH (ref 0.3–1.2)
Total Protein: 8 g/dL (ref 6.5–8.1)

## 2019-09-13 LAB — CEA: CEA: 7.5 ng/mL — ABNORMAL HIGH (ref 0.0–4.7)

## 2019-09-14 ENCOUNTER — Encounter: Payer: Self-pay | Admitting: Family Medicine

## 2019-09-14 ENCOUNTER — Ambulatory Visit (HOSPITAL_COMMUNITY): Payer: 59 | Admitting: Hematology

## 2019-09-18 ENCOUNTER — Encounter (HOSPITAL_COMMUNITY): Payer: 59

## 2019-09-18 ENCOUNTER — Ambulatory Visit (HOSPITAL_COMMUNITY)
Admission: RE | Admit: 2019-09-18 | Discharge: 2019-09-18 | Disposition: A | Discharge status: Home / Self Care | Payer: 59 | Source: Ambulatory Visit | Attending: Hematology | Admitting: Hematology

## 2019-09-18 ENCOUNTER — Other Ambulatory Visit: Payer: Self-pay

## 2019-09-18 DIAGNOSIS — C184 Malignant neoplasm of transverse colon: Secondary | ICD-10-CM | POA: Insufficient documentation

## 2019-09-18 MED ORDER — IOHEXOL 300 MG/ML  SOLN
100.0000 mL | Freq: Once | INTRAMUSCULAR | Status: AC | PRN
  Administered 2019-09-18: 100 mL via INTRAVENOUS

## 2019-09-21 ENCOUNTER — Inpatient Hospital Stay (HOSPITAL_COMMUNITY): Payer: 59 | Attending: Hematology | Admitting: Hematology

## 2019-09-21 ENCOUNTER — Encounter (HOSPITAL_COMMUNITY): Payer: Self-pay | Admitting: Hematology

## 2019-09-21 ENCOUNTER — Other Ambulatory Visit: Payer: Self-pay

## 2019-09-21 DIAGNOSIS — F17210 Nicotine dependence, cigarettes, uncomplicated: Secondary | ICD-10-CM | POA: Insufficient documentation

## 2019-09-21 DIAGNOSIS — R200 Anesthesia of skin: Secondary | ICD-10-CM | POA: Diagnosis not present

## 2019-09-21 DIAGNOSIS — Z809 Family history of malignant neoplasm, unspecified: Secondary | ICD-10-CM | POA: Insufficient documentation

## 2019-09-21 DIAGNOSIS — G479 Sleep disorder, unspecified: Secondary | ICD-10-CM | POA: Insufficient documentation

## 2019-09-21 DIAGNOSIS — G629 Polyneuropathy, unspecified: Secondary | ICD-10-CM | POA: Insufficient documentation

## 2019-09-21 DIAGNOSIS — E538 Deficiency of other specified B group vitamins: Secondary | ICD-10-CM | POA: Diagnosis not present

## 2019-09-21 DIAGNOSIS — Z23 Encounter for immunization: Secondary | ICD-10-CM | POA: Diagnosis not present

## 2019-09-21 DIAGNOSIS — Z801 Family history of malignant neoplasm of trachea, bronchus and lung: Secondary | ICD-10-CM | POA: Diagnosis not present

## 2019-09-21 DIAGNOSIS — Z8249 Family history of ischemic heart disease and other diseases of the circulatory system: Secondary | ICD-10-CM | POA: Diagnosis not present

## 2019-09-21 DIAGNOSIS — Z83438 Family history of other disorder of lipoprotein metabolism and other lipidemia: Secondary | ICD-10-CM | POA: Diagnosis not present

## 2019-09-21 DIAGNOSIS — C184 Malignant neoplasm of transverse colon: Secondary | ICD-10-CM

## 2019-09-21 DIAGNOSIS — Z79899 Other long term (current) drug therapy: Secondary | ICD-10-CM | POA: Diagnosis not present

## 2019-09-21 MED ORDER — INFLUENZA VAC SPLIT QUAD 0.5 ML IM SUSY
0.5000 mL | PREFILLED_SYRINGE | Freq: Once | INTRAMUSCULAR | Status: AC
  Administered 2019-09-21: 0.5 mL via INTRAMUSCULAR
  Filled 2019-09-21: qty 0.5

## 2019-09-21 NOTE — Patient Instructions (Addendum)
[facility] at [facility] Discharge Instructions  You were seen today by Dr. Katragadda. He went over your recent lab results. He will see you back in 3 months for labs and follow up.   Thank you for choosing [facility] at [facility] to provide your oncology and hematology care.  To afford each patient quality time with our provider, please arrive at least 15 minutes before your scheduled appointment time.   If you have a lab appointment with the Cancer Center please come in thru the  Main Entrance and check in at the main information desk  You need to re-schedule your appointment should you arrive 10 or more minutes late.  We strive to give you quality time with our providers, and arriving late affects you and other patients whose appointments are after yours.  Also, if you no show three or more times for appointments you may be dismissed from the clinic at the providers discretion.     Again, thank you for choosing [facility].  Our hope is that these requests will decrease the amount of time that you wait before being seen by our physicians.       _____________________________________________________________  Should you have questions after your visit to [facility], please contact our office at [phone] between the hours of 8:00 a.m. and 4:30 p.m.  Voicemails left after 4:00 p.m. will not be returned until the following business day.  For prescription refill requests, have your pharmacy contact our office and allow 72 hours.    Cancer Center Support Programs:   > Cancer Support Group  2nd Tuesday of the month 1pm-2pm, Journey Room    

## 2019-09-21 NOTE — Assessment & Plan Note (Addendum)
1.  Stage III (PT4BPN1A) transverse colon adenocarcinoma: -Laparoscopic right colectomy on 01/09/2019, 1/25 lymph nodes positive, no tumor deposits, no perineural invasion, grade 2, tumor extending through serosa into adherent omentum, margins negative. -PET scan on 01/31/2019 shows focal hypermetabolic activity identified in the ileocolic anastomosis with SUV of 9.  This could be related to postsurgical changes.  Follow-up as needed. -12 cycles of FOLFOX from 03/01/2019 through 08/22/2019 (oxaliplatin discontinued after cycle 10). - CEA on 09/12/2019 was elevated at 7.5.  She is a current active smoker. - We reviewed results of the CTAP from 09/18/2019.  No definite signs of residual or recurrent disease.  Trace fluid/fascial thickening just beneath the right hemiliver not significantly changed based on comparison with PET scan. -I have recommended follow-up visit in 3 months with repeat CEA level.  If there is significant increase in CEA level, will consider doing a PET CT scan.  If CEA stable, will consider another scan 3 months from next visit.  2.  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  MSI was high by PCR.  BRAF testing was negative. -MLH1 hyper methylation was present.  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  3.  Peripheral neuropathy: -Numbness in the finger tips is stable.  However numbness in the toes has spread to the feet.  No pains reported.  Hence we will hold off on gabapentin.  4.  Vitamin D deficiency: -Vitamin D level on 05/23/2019 was 14.8. -She will continue 50,000 units weekly.  5.  Health maintenance: - Mammogram on 09/04/2019 was BI-RADS Category 1.

## 2019-09-21 NOTE — Progress Notes (Signed)
Andrea Simon, Andrea Simon [id number]   CLINIC:  Medical Oncology/Hematology  PCP:  Fayrene Helper, MD 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  Currently on surveillance.  BRIEF ONCOLOGIC HISTORY:  Oncology History  Colon cancer ([place])  01/09/2019 Initial Diagnosis   Colon cancer ([place])   03/01/2019 -  Chemotherapy   The patient had palonosetron (ALOXI) injection 0.25 mg, 0.25 mg, Intravenous,  Once, 12 of 12 cycles Administration: 0.25 mg (03/01/2019), 0.25 mg (03/15/2019), 0.25 mg (03/27/2019), 0.25 mg (04/10/2019), 0.25 mg (05/09/2019), 0.25 mg (05/23/2019), 0.25 mg (06/06/2019), 0.25 mg (06/20/2019), 0.25 mg (07/25/2019), 0.25 mg (07/11/2019), 0.25 mg (08/08/2019), 0.25 mg (08/22/2019) pegfilgrastim (NEULASTA) injection 6 mg, 6 mg, Subcutaneous, Once, 8 of 8 cycles Administration: 6 mg (05/11/2019), 6 mg (05/25/2019), 6 mg (06/08/2019), 6 mg (06/22/2019), 6 mg (07/13/2019), 6 mg (07/27/2019), 6 mg (08/10/2019) leucovorin 600 mg in dextrose 5 % 250 mL infusion, 640 mg, Intravenous,  Once, 12 of 12 cycles Administration: 600 mg (03/01/2019), 600 mg (03/15/2019), 640 mg (03/27/2019), 640 mg (04/10/2019), 640 mg (05/09/2019), 640 mg (05/23/2019), 640 mg (06/06/2019), 640 mg (06/20/2019), 640 mg (07/25/2019), 640 mg (07/11/2019), 640 mg (08/08/2019), 640 mg (08/22/2019) oxaliplatin (ELOXATIN) 135 mg in dextrose 5 % 500 mL chemo infusion, 85 mg/m2 = 135 mg, Intravenous,  Once, 10 of 10 cycles Dose modification: 68 mg/m2 (80 % of original dose 85 mg/m2, Cycle 5, Reason: Provider Judgment), 68 mg/m2 (original dose 85 mg/m2, Cycle 6, Reason: Provider Judgment), 51 mg/m2 (60 % of original dose 85 mg/m2, Cycle 10, Reason: Other (see comments), Comment: neuropathy) Administration: 135 mg (03/01/2019), 135 mg (03/15/2019), 135 mg  (03/27/2019), 135 mg (04/10/2019), 110 mg (05/09/2019), 110 mg (05/23/2019), 110 mg (06/06/2019), 110 mg (06/20/2019), 80 mg (07/25/2019), 110 mg (07/11/2019) fluorouracil (ADRUCIL) chemo injection 650 mg, 400 mg/m2 = 650 mg, Intravenous,  Once, 12 of 12 cycles Administration: 650 mg (03/01/2019), 650 mg (03/15/2019), 650 mg (03/27/2019), 650 mg (04/10/2019), 650 mg (05/09/2019), 650 mg (05/23/2019), 650 mg (06/06/2019), 650 mg (06/20/2019), 650 mg (07/25/2019), 650 mg (07/11/2019), 650 mg (08/08/2019), 650 mg (08/22/2019) fluorouracil (ADRUCIL) 3,850 mg in sodium chloride 0.9 % 73 mL chemo infusion, 2,400 mg/m2 = 3,850 mg, Intravenous, 1 Day/Dose, 12 of 12 cycles Administration: 3,850 mg (03/01/2019), 3,850 mg (03/15/2019), 3,850 mg (03/27/2019), 3,850 mg (04/10/2019), 3,850 mg (05/09/2019), 3,850 mg (05/23/2019), 3,850 mg (06/06/2019), 3,850 mg (06/20/2019), 3,850 mg (07/25/2019), 3,850 mg (07/11/2019), 3,850 mg (08/08/2019), 3,850 mg (08/22/2019)  for chemotherapy treatment.        INTERVAL HISTORY:  [name] 63 y.o. female seen for follow-up of colon cancer.  She completed cycle 12 on 08/22/2019.  She reported a slight worsening of her neuropathy with numbness in the feet getting worse.  But denies any pins-and-needles sensation.  Energy levels are coming back to normal.  She is continuing vitamin D supplements.  Denies any nausea, vomiting or diarrhea or constipation.  REVIEW OF SYSTEMS:  Review of Systems  Neurological: Positive for numbness.  Psychiatric/Behavioral: Positive for sleep disturbance.  All other systems reviewed and are negative.    PAST MEDICAL/SURGICAL HISTORY:  Past Medical History:  Diagnosis Date  . Hyperlipidemia   . Multinodular goiter   . Prediabetes    Past Surgical History:  Procedure Laterality Date  . BIOPSY  12/07/2018  Procedure: BIOPSY;  Surgeon: Rogene Houston, MD;  Location: [facility];  Service: Endoscopy;;  sigmoid colon  . COLON RESECTION N/A 01/09/2019   Procedure: LAPAROSCOPIC  RIGHT HEMICOLECTOMY;  Surgeon: Virl Cagey, MD;  Location: [facility];  Service: General;  Laterality: N/A;  . COLONOSCOPY N/A 12/07/2018   Procedure: COLONOSCOPY;  Surgeon: Rogene Houston, MD;  Location: [facility];  Service: Endoscopy;  Laterality: N/A;  12:45  . DILATION AND CURETTAGE OF UTERUS  2006  . FNA thyroid  2011 and 2019   benign  . POLYPECTOMY  12/07/2018   Procedure: POLYPECTOMY;  Surgeon: Rogene Houston, MD;  Location: [facility];  Service: Endoscopy;;  splenic flexure (CS x1)  . PORTACATH PLACEMENT Left 02/15/2019   Procedure: INSERTION PORT-A-CATH;  Surgeon: Virl Cagey, MD;  Location: [facility];  Service: General;  Laterality: Left;  . TUBAL LIGATION  1992     SOCIAL HISTORY:  Social History   Socioeconomic History  . Marital status: Married    Spouse name: Not on file  . Number of children: 1  . Years of education: Not on file  . Highest education level: Not on file  Occupational History  . Occupation: [employer]  . Financial resource strain: Not on file  . Food insecurity    Worry: Not on file    Inability: Not on file  . Transportation needs    Medical: Not on file    Non-medical: Not on file  Tobacco Use  . Smoking status: Current Every Day Smoker    Packs/day: 0.75    Years: 45.00    Pack years: 33.75    Types: Cigarettes  . Smokeless tobacco: Never Used  Substance and Sexual Activity  . Alcohol use: Yes    Alcohol/week: 0.0 standard drinks    Comment: occasionally  . Drug use: No  . Sexual activity: Yes  Lifestyle  . Physical activity    Days per week: Not on file    Minutes per session: Not on file  . Stress: Not on file  Relationships  . Social Herbalist on phone: Not on file    Gets together: Not on file    Attends religious service: Not on file    Active member of club or organization: Not on file    Attends meetings of clubs or organizations: Not on file    Relationship  status: Not on file  . Intimate partner violence    Fear of current or ex partner: Not on file    Emotionally abused: Not on file    Physically abused: Not on file    Forced sexual activity: Not on file  Other Topics Concern  . Not on file  Social History Narrative  . Not on file    FAMILY HISTORY:  Family History  Problem Relation Age of Onset  . Hypertension Mother        AAA  . Coronary artery disease Mother   . Hyperlipidemia Mother   . Cancer Mother        lung  . Hypertension Father   . Cancer Brother     CURRENT MEDICATIONS:  Outpatient Encounter Medications as of 09/21/2019  Medication Sig  . acetaminophen (TYLENOL) 500 MG tablet Take 1,000 mg by mouth every 6 (six) hours as needed for moderate pain.   Marland Kitchen ALPRAZolam (XANAX) 0.25 MG tablet Take one tablet at bedtime, as needed ,  for anxiety  . aspirin EC 81 MG tablet Take 1 tablet (81 mg total) by mouth every evening.  . Calcium Carbonate-Vitamin D (CALCIUM 600+D) 600-400 MG-UNIT tablet Take 1 tablet by mouth 2 (two) times a week. Sunday and Wednesday evenings  . docusate sodium (COLACE) 100 MG capsule Take 1 capsule (100 mg total) by mouth 2 (two) times daily.  . ergocalciferol (VITAMIN D2) 1.25 MG (50000 UT) capsule Take 1 capsule (50,000 Units total) by mouth once a week.  . fluorouracil CALGB 36144 in sodium chloride 0.9 % 150 mL Inject into the vein 2 days. Every 14 days  . LEUCOVORIN CALCIUM IV Inject into the vein every 14 (fourteen) days.  Marland Kitchen lidocaine-prilocaine (EMLA) cream Apply to port a cath site one hour prior to chemotherapy appointment to numb the skin over your port site.  . ondansetron (ZOFRAN) 4 MG tablet Take 1 tablet (4 mg total) by mouth every 8 (eight) hours as needed for nausea or vomiting.  . OXALIPLATIN IV Inject into the vein every 14 (fourteen) days.  . pravastatin (PRAVACHOL) 40 MG tablet TAKE 1 TABLET BY MOUTH IN  THE EVENING   Facility-Administered Encounter Medications as of 09/21/2019   Medication  . [COMPLETED] influenza vac split quadrivalent PF (FLUARIX) injection 0.5 mL  . sodium chloride flush (NS) 0.9 % injection 10 mL    ALLERGIES:  No Known Allergies   PHYSICAL EXAM:  ECOG Performance status: 1  Vitals:   09/21/19 0813  BP: 140/89  Pulse: 72  Resp: 14  Temp: (!) 97.1 F (36.2 C)  SpO2: 100%   Filed Weights   09/21/19 0813  Weight: 123 lb 14.4 oz (56.2 kg)    Physical Exam Constitutional:      Appearance: Normal appearance.  Neck:     Musculoskeletal: Normal range of motion.  Cardiovascular:     Rate and Rhythm: Normal rate and regular rhythm.     Pulses: Normal pulses.     Heart sounds: Normal heart sounds.  Pulmonary:     Effort: Pulmonary effort is normal.     Breath sounds: Normal breath sounds.  Abdominal:     General: There is no distension.     Palpations: Abdomen is soft. There is no mass.  Musculoskeletal: Normal range of motion.  Skin:    General: Skin is warm and dry.  Neurological:     General: No focal deficit present.     Mental Status: She is alert and oriented to person, place, and time.  Psychiatric:        Mood and Affect: Mood normal.        Behavior: Behavior normal.      LABORATORY DATA:  I have reviewed the labs as listed.  CBC    Component Value Date/Time   WBC 3.8 (L) 09/12/2019 0819   RBC 4.23 09/12/2019 0819   HGB 13.3 09/12/2019 0819   HGB 13.5 08/18/2018 0758   HCT 41.7 09/12/2019 0819   HCT 37.2 08/18/2018 0758   PLT 148 (L) 09/12/2019 0819   PLT 259 08/18/2018 0758   MCV 98.6 09/12/2019 0819   MCV 104 (H) 08/18/2018 0758   MCH 31.4 09/12/2019 0819   MCHC 31.9 09/12/2019 0819   RDW 16.7 (H) 09/12/2019 0819   RDW 13.0 08/18/2018 0758   LYMPHSABS 1.4 09/12/2019 0819   LYMPHSABS 3.5 (H) 08/31/2017 0955   MONOABS 0.3 09/12/2019 0819   EOSABS 0.0 09/12/2019 0819   EOSABS 0.1 08/31/2017 0955   BASOSABS 0.0  09/12/2019 0819   BASOSABS 0.0 08/31/2017 0955   CMP Latest Ref Rng & Units  09/12/2019 08/22/2019 08/08/2019  Glucose 70 - 99 mg/dL 99 164(H) 117(H)  BUN 8 - 23 mg/dL '9 10 8  ' Creatinine 0.44 - 1.00 mg/dL 0.71 0.66 0.56  Sodium 135 - 145 mmol/L 140 138 137  Potassium 3.5 - 5.1 mmol/L 3.8 3.2(L) 3.7  Chloride 98 - 111 mmol/L 104 105 104  CO2 22 - 32 mmol/L '26 22 24  ' Calcium 8.9 - 10.3 mg/dL 10.1 9.4 9.5  Total Protein 6.5 - 8.1 g/dL 8.0 7.2 7.4  Total Bilirubin 0.3 - 1.2 mg/dL 1.7(H) 0.9 1.1  Alkaline Phos 38 - 126 U/L 120 115 116  AST 15 - 41 U/L 32 32 31  ALT 0 - 44 U/L '20 19 18   ' I have independently reviewed her scans.    ASSESSMENT & PLAN:   Malignant neoplasm of transverse colon ([place]) 1.  Stage III (PT4BPN1A) transverse colon adenocarcinoma: -Laparoscopic right colectomy on 01/09/2019, 1/25 lymph nodes positive, no tumor deposits, no perineural invasion, grade 2, tumor extending through serosa into adherent omentum, margins negative. -PET scan on 01/31/2019 shows focal hypermetabolic activity identified in the ileocolic anastomosis with SUV of 9.  This could be related to postsurgical changes.  Follow-up as needed. -12 cycles of FOLFOX from 03/01/2019 through 08/22/2019 (oxaliplatin discontinued after cycle 10). - CEA on 09/12/2019 was elevated at 7.5.  She is a current active smoker. - We reviewed results of the CTAP from 09/18/2019.  No definite signs of residual or recurrent disease.  Trace fluid/fascial thickening just beneath the right hemiliver not significantly changed based on comparison with PET scan. -I have recommended follow-up visit in 3 months with repeat CEA level.  If there is significant increase in CEA level, will consider doing a PET CT scan.  If CEA stable, will consider another scan 3 months from next visit.  2.  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  MSI was high by PCR.  BRAF testing was negative. -MLH1 hyper methylation was present.  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  3.  Peripheral neuropathy: -Numbness in the  finger tips is stable.  However numbness in the toes has spread to the feet.  No pains reported.  Hence we will hold off on gabapentin.  4.  Vitamin D deficiency: -Vitamin D level on 05/23/2019 was 14.8. -She will continue 50,000 units weekly.  5.  Health maintenance: - Mammogram on 09/04/2019 was BI-RADS Category 1.   Total time spent is 25 minutes with more than 50% of the time spent face-to-face discussing lab results, treatment plan and coordination of care.   Derek Jack, MD  [place] [phone]

## 2019-10-11 ENCOUNTER — Other Ambulatory Visit: Payer: Self-pay

## 2019-10-11 ENCOUNTER — Encounter: Payer: Self-pay | Admitting: Family Medicine

## 2019-10-11 ENCOUNTER — Ambulatory Visit (INDEPENDENT_AMBULATORY_CARE_PROVIDER_SITE_OTHER): Payer: 59 | Admitting: Family Medicine

## 2019-10-11 VITALS — BP 122/84 | HR 78 | Temp 97.7°F | Resp 15 | Ht 63.0 in | Wt 127.0 lb

## 2019-10-11 DIAGNOSIS — Z23 Encounter for immunization: Secondary | ICD-10-CM

## 2019-10-11 DIAGNOSIS — Z122 Encounter for screening for malignant neoplasm of respiratory organs: Secondary | ICD-10-CM

## 2019-10-11 DIAGNOSIS — H43399 Other vitreous opacities, unspecified eye: Secondary | ICD-10-CM

## 2019-10-11 DIAGNOSIS — E8881 Metabolic syndrome: Secondary | ICD-10-CM

## 2019-10-11 DIAGNOSIS — E782 Mixed hyperlipidemia: Secondary | ICD-10-CM | POA: Diagnosis not present

## 2019-10-11 DIAGNOSIS — E1169 Type 2 diabetes mellitus with other specified complication: Secondary | ICD-10-CM | POA: Diagnosis not present

## 2019-10-11 DIAGNOSIS — F17200 Nicotine dependence, unspecified, uncomplicated: Secondary | ICD-10-CM

## 2019-10-11 DIAGNOSIS — H547 Unspecified visual loss: Secondary | ICD-10-CM

## 2019-10-11 DIAGNOSIS — Z0000 Encounter for general adult medical examination without abnormal findings: Secondary | ICD-10-CM

## 2019-10-11 NOTE — Progress Notes (Signed)
    Andrea Simon     MRN: [id number]      DOB: 15-Aug-1956  HPI: Patient is in for annual physical exam. No other health concerns are expressed or addressed at the visit. Recent labs, if available are reviewed. Immunization is reviewed , and  updated if needed.   PE:BP 122/84   Pulse 78   Temp 97.7 F (36.5 C) (Temporal)   Resp 15   Ht 5\' 3"  (1.6 m)   Wt 127 lb (57.6 kg)   SpO2 98%   BMI 22.50 kg/m   Pleasant  female, alert and oriented x 3, in no cardio-pulmonary distress. Afebrile. HEENT No facial trauma or asymetry. Sinuses non tender.  Extra occullar muscles intact.. External ears normal, . Neck: supple, no adenopathy,JVD or thyromegaly.No bruits.  Chest: Clear to ascultation bilaterally.No crackles or wheezes. Non tender to palpation  Breast: No asymetry,no masses or lumps. No tenderness. No nipple discharge or inversion. No axillary or supraclavicular adenopathy  Cardiovascular system; Heart sounds normal,  S1 and  S2 ,no S3.  No murmur, or thrill. Apical beat not displaced Peripheral pulses normal.  Abdomen: Soft, non tender, no organomegaly or masses. No bruits. Bowel sounds normal. No guarding, tenderness or rebound.   GU: External genitalia normal female genitalia , normal female distribution of hair. No lesions. Urethral meatus normal in size, no  Prolapse, no lesions visibly  Present. Bladder non tender. Vagina pink and moist , with no visible lesions , discharge present . Adequate pelvic support no  cystocele or rectocele noted Cervix pink and appears healthy, no lesions or ulcerations noted, no discharge noted from os Uterus normal size, no adnexal masses, no cervical motion or adnexal tenderness.   Musculoskeletal exam: Full ROM of spine, hips , shoulders and knees. No deformity ,swelling or crepitus noted. No muscle wasting or atrophy.   Neurologic: Cranial nerves 2 to 12 intact. Power, tone ,sensation and reflexes normal  throughout. No disturbance in gait. No tremor.  Skin: Intact, no ulceration, erythema , scaling or rash noted. Pigmentation normal throughout  Psych; Normal mood and affect. Judgement and concentration normal   Assessment & Plan:  Annual physical exam Annual exam as documented. Counseling done  re healthy lifestyle involving commitment to 150 minutes exercise per week, heart healthy diet, and attaining healthy weight.The importance of adequate sleep also discussed. Regular seat belt use and home safety, is also discussed. Changes in health habits are decided on by the patient with goals and time frames  set for achieving them. Immunization and cancer screening needs are specifically addressed at this visit.   NICOTINE ADDICTION Asked:confirms currently smokes cigarettes. Assess:wants to attempt cutting backting back, unwilling to set quit date : needs to QUIT to reduce risk of cancer, cardio and cerebrovascular disease Assist: counseled for 5 minutes and literature provided Arrange: follow up in 3 months   Need for 23-polyvalent pneumococcal polysaccharide vaccine After obtaining informed consent, the vaccine is  administered , with no adverse effect noted at the time of administration.

## 2019-10-11 NOTE — Assessment & Plan Note (Signed)

## 2019-10-11 NOTE — Patient Instructions (Addendum)
F/u in office with mD in April 2021, call if you need me sooner  Pneumonmia 23 today    You are being referred for screening chest scan in December , please let us know your available dated  Fasting lipid, cmp and EeGFr, HBA1C and Vit D 2nd week in December, we will contact you with results  You will be referred for eye exam in January  Please continue to think about stopping smoking cigarettes and start cutting back slowly. [employer]QuitNOW IS A GOOD RESOURCE , also medication including patches or inhalers can be prescribed to help with this  Thankful that you have successfully completed chemotherapy and are feeling better over time

## 2019-10-12 ENCOUNTER — Encounter: Payer: Self-pay | Admitting: Family Medicine

## 2019-10-12 NOTE — Assessment & Plan Note (Signed)
After obtaining informed consent, the vaccine is  administered , with no adverse effect noted at the time of administration.  

## 2019-10-12 NOTE — Assessment & Plan Note (Signed)
Asked:confirms currently smokes cigarettes. Assess:wants to attempt cutting backting back, unwilling to set quit date : needs to QUIT to reduce risk of cancer, cardio and cerebrovascular disease Assist: counseled for 5 minutes and literature provided Arrange: follow up in 3 months

## 2019-10-16 DIAGNOSIS — Z23 Encounter for immunization: Secondary | ICD-10-CM | POA: Diagnosis not present

## 2019-10-16 NOTE — Addendum Note (Signed)
Addended by: Eual Fines on: 10/16/2019 04:32 PM   Modules accepted: Orders

## 2019-11-15 ENCOUNTER — Telehealth: Payer: Self-pay | Admitting: *Deleted

## 2019-11-15 NOTE — Telephone Encounter (Signed)
Curtis Are with the [employer] center called about a cat scan scheduled for the patient at [facility] Dec 1. [employer] requires a prior authorization for this and there has not been one done. He can be reached at [phone] ext [phone]

## 2019-11-20 ENCOUNTER — Telehealth: Payer: Self-pay | Admitting: Family Medicine

## 2019-11-20 NOTE — Telephone Encounter (Signed)
Precert submitted- waiting on decision

## 2019-11-21 ENCOUNTER — Ambulatory Visit (HOSPITAL_COMMUNITY): Payer: 59

## 2019-11-21 ENCOUNTER — Encounter (HOSPITAL_COMMUNITY): Payer: Self-pay

## 2019-11-21 ENCOUNTER — Telehealth: Payer: Self-pay

## 2019-11-21 NOTE — Telephone Encounter (Signed)
Pt is calling, she states that something is wrong with the Prior Auth for the Cancer Screening ,,please call the pt

## 2019-11-21 NOTE — Telephone Encounter (Signed)
precert approved and message sent to patient via mychart and I see she rescheduled her appt until 12/15

## 2019-11-21 NOTE — Telephone Encounter (Signed)
mychart message sent to pt letting her know its been approved

## 2019-12-05 ENCOUNTER — Ambulatory Visit (HOSPITAL_COMMUNITY)
Admission: RE | Admit: 2019-12-05 | Discharge: 2019-12-05 | Disposition: A | Discharge status: Home / Self Care | Payer: 59 | Source: Ambulatory Visit | Attending: Family Medicine | Admitting: Family Medicine

## 2019-12-05 ENCOUNTER — Other Ambulatory Visit: Payer: Self-pay

## 2019-12-05 DIAGNOSIS — Z122 Encounter for screening for malignant neoplasm of respiratory organs: Secondary | ICD-10-CM | POA: Insufficient documentation

## 2019-12-06 ENCOUNTER — Encounter: Payer: Self-pay | Admitting: Family Medicine

## 2019-12-18 ENCOUNTER — Encounter (HOSPITAL_COMMUNITY): Payer: Self-pay

## 2019-12-18 ENCOUNTER — Other Ambulatory Visit: Payer: Self-pay

## 2019-12-18 ENCOUNTER — Inpatient Hospital Stay (HOSPITAL_COMMUNITY): Payer: 59 | Attending: Hematology

## 2019-12-18 DIAGNOSIS — C184 Malignant neoplasm of transverse colon: Secondary | ICD-10-CM

## 2019-12-18 LAB — COMPREHENSIVE METABOLIC PANEL
ALT: 16 U/L (ref 0–44)
AST: 27 U/L (ref 15–41)
Albumin: 4.3 g/dL (ref 3.5–5.0)
Alkaline Phosphatase: 86 U/L (ref 38–126)
Anion gap: 12 (ref 5–15)
BUN: 6 mg/dL — ABNORMAL LOW (ref 8–23)
CO2: 26 mmol/L (ref 22–32)
Calcium: 9.8 mg/dL (ref 8.9–10.3)
Chloride: 102 mmol/L (ref 98–111)
Creatinine, Ser: 0.63 mg/dL (ref 0.44–1.00)
GFR calc Af Amer: >60 mL/min (ref >60)
GFR calc non Af Amer: >60 mL/min (ref >60)
Glucose, Bld: 121 mg/dL — ABNORMAL HIGH (ref 70–99)
Potassium: 3.2 mmol/L — ABNORMAL LOW (ref 3.5–5.1)
Sodium: 140 mmol/L (ref 135–145)
Total Bilirubin: 1.2 mg/dL (ref 0.3–1.2)
Total Protein: 7.3 g/dL (ref 6.5–8.1)

## 2019-12-18 LAB — CBC WITH DIFFERENTIAL/PLATELET
Abs Immature Granulocytes: 0.02 10*3/uL (ref 0.00–0.07)
Basophils Absolute: 0 10*3/uL (ref 0.0–0.1)
Basophils Relative: 1 %
Eosinophils Absolute: 0 10*3/uL (ref 0.0–0.5)
Eosinophils Relative: 1 %
HCT: 35.8 % — ABNORMAL LOW (ref 36.0–46.0)
Hemoglobin: 12.4 g/dL (ref 12.0–15.0)
Immature Granulocytes: 1 %
Lymphocytes Relative: 48 %
Lymphs Abs: 1.9 10*3/uL (ref 0.7–4.0)
MCH: 34.7 pg — ABNORMAL HIGH (ref 26.0–34.0)
MCHC: 34.6 g/dL (ref 30.0–36.0)
MCV: 100.3 fL — ABNORMAL HIGH (ref 80.0–100.0)
Monocytes Absolute: 0.2 10*3/uL (ref 0.1–1.0)
Monocytes Relative: 5 %
Neutro Abs: 1.6 10*3/uL — ABNORMAL LOW (ref 1.7–7.7)
Neutrophils Relative %: 44 %
Platelets: 179 10*3/uL (ref 150–400)
RBC: 3.57 MIL/uL — ABNORMAL LOW (ref 3.87–5.11)
RDW: 17.1 % — ABNORMAL HIGH (ref 11.5–15.5)
WBC: 3.7 10*3/uL — ABNORMAL LOW (ref 4.0–10.5)
nRBC: 0 % (ref 0.0–0.2)

## 2019-12-18 MED ORDER — SODIUM CHLORIDE 0.9% FLUSH
10.0000 mL | Freq: Once | INTRAVENOUS | Status: AC
  Administered 2019-12-18: 10:00:00 10 mL

## 2019-12-18 MED ORDER — HEPARIN SOD (PORK) LOCK FLUSH 100 UNIT/ML IV SOLN
500.0000 [IU] | Freq: Once | INTRAVENOUS | Status: AC
  Administered 2019-12-18: 500 [IU] via INTRAVENOUS

## 2019-12-18 NOTE — Progress Notes (Signed)
Andrea Simon presented for Portacath access and flush. Portacath located in the right chest wall accessed with  H 20 needle. Clean, Dry and Intact Good blood return present. Labs drawn.  Portacath flushed with 92ml NS and 500U/83ml Heparin per protocol and needle removed intact. Procedure without incident. Patient tolerated procedure well.

## 2019-12-19 LAB — CEA: CEA: 7.7 ng/mL — ABNORMAL HIGH (ref 0.0–4.7)

## 2019-12-25 ENCOUNTER — Other Ambulatory Visit: Payer: Self-pay

## 2019-12-25 ENCOUNTER — Encounter (HOSPITAL_COMMUNITY): Payer: Self-pay | Admitting: Hematology

## 2019-12-25 ENCOUNTER — Inpatient Hospital Stay (HOSPITAL_COMMUNITY): Payer: 59 | Attending: Hematology | Admitting: Hematology

## 2019-12-25 VITALS — BP 142/82 | HR 78 | Temp 97.9°F | Resp 18 | Wt 133.6 lb

## 2019-12-25 DIAGNOSIS — F17210 Nicotine dependence, cigarettes, uncomplicated: Secondary | ICD-10-CM | POA: Insufficient documentation

## 2019-12-25 DIAGNOSIS — R200 Anesthesia of skin: Secondary | ICD-10-CM | POA: Insufficient documentation

## 2019-12-25 DIAGNOSIS — E785 Hyperlipidemia, unspecified: Secondary | ICD-10-CM | POA: Diagnosis not present

## 2019-12-25 DIAGNOSIS — Z83438 Family history of other disorder of lipoprotein metabolism and other lipidemia: Secondary | ICD-10-CM | POA: Insufficient documentation

## 2019-12-25 DIAGNOSIS — Z79899 Other long term (current) drug therapy: Secondary | ICD-10-CM | POA: Diagnosis not present

## 2019-12-25 DIAGNOSIS — Z801 Family history of malignant neoplasm of trachea, bronchus and lung: Secondary | ICD-10-CM | POA: Diagnosis not present

## 2019-12-25 DIAGNOSIS — E559 Vitamin D deficiency, unspecified: Secondary | ICD-10-CM | POA: Diagnosis not present

## 2019-12-25 DIAGNOSIS — G479 Sleep disorder, unspecified: Secondary | ICD-10-CM | POA: Diagnosis not present

## 2019-12-25 DIAGNOSIS — G629 Polyneuropathy, unspecified: Secondary | ICD-10-CM | POA: Insufficient documentation

## 2019-12-25 DIAGNOSIS — Z8249 Family history of ischemic heart disease and other diseases of the circulatory system: Secondary | ICD-10-CM | POA: Insufficient documentation

## 2019-12-25 DIAGNOSIS — C184 Malignant neoplasm of transverse colon: Secondary | ICD-10-CM | POA: Insufficient documentation

## 2019-12-25 MED ORDER — GABAPENTIN 100 MG PO CAPS: 100.0000 mg | ORAL_CAPSULE | Freq: Three times a day (TID) | ORAL | 2 refills | Status: DC

## 2019-12-25 NOTE — Assessment & Plan Note (Signed)
1.  Stage III (PT4BPN1A) transverse colon adenocarcinoma: -Laparoscopic right colectomy on 01/09/2019, 1/25 lymph nodes positive, no tumor deposits, no perineural invasion, grade 2, tumor extending through serosa into adherent omentum, margins negative. -PET scan on 01/31/2019 shows focal hypermetabolic activity identified in the ileocolic anastomosis with SUV of 9.  This could be related to postsurgical changes.  Follow-up as needed. -12 cycles of FOLFOX from 03/01/2019 through 08/22/2019 (oxaliplatin discontinued after cycle 10). -CT abdomen and pelvis on 09/10/2019 did not show any evidence of residual or recurrent disease. -Her CEA was 7.53 months ago.  We discussed the labs today.  CEA is 7.7. -She does not have any clinical signs or symptoms of recurrence at this time.  Physical examination was normal. -I have recommended close surveillance in 3 months with repeat CEA level.  We will also repeat a CT scan of the abdomen and pelvis prior to next visit.  If there is significant elevation of the CEA, will consider PET scan if CT scans are nondiagnostic.  2.  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  MSI was high by PCR.  BRAF testing was negative. -MLH1 hyper methylation was present.  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  3.  Peripheral neuropathy: -This is oxaliplatin induced. -She reports numbness has gotten worse in the fingertips.  They feel like sandpaper.  Numbness in the feet has been stable. -We will start her on gabapentin 100 mg 3 times a day.  We discussed the side effects in detail.  We will titrate up as tolerated.  4.  Vitamin D deficiency: -Vitamin D level in June was low at 14.8.  She is taking vitamin D 50,000 units weekly.  We will check another level prior to next visit.  5.  Smoking history: -She is a 40+ pack year smoker. -CT low-dose on 12/05/2019 shows lung RADS 2 with emphysema.

## 2019-12-25 NOTE — Progress Notes (Signed)
Andrea Simon, [place] [address]   CLINIC:  Medical Oncology/Hematology  PCP:  Fayrene Helper, MD [address] [place] [address] [phone]   REASON FOR VISIT:  Follow-up for Stage III (pT4b pN1a) transverse colon adenocarcinoma:  CURRENT THERAPY: 12 cycles of adjuvant FOLFOX completed.  Currently on surveillance.  BRIEF ONCOLOGIC HISTORY:  Oncology History  Colon cancer ([place])  01/09/2019 Initial Diagnosis   Colon cancer ([place])   03/01/2019 -  Chemotherapy   The patient had palonosetron (ALOXI) injection 0.25 mg, 0.25 mg, Intravenous,  Once, 12 of 12 cycles Administration: 0.25 mg (03/01/2019), 0.25 mg (03/15/2019), 0.25 mg (03/27/2019), 0.25 mg (04/10/2019), 0.25 mg (05/09/2019), 0.25 mg (05/23/2019), 0.25 mg (06/06/2019), 0.25 mg (06/20/2019), 0.25 mg (07/25/2019), 0.25 mg (07/11/2019), 0.25 mg (08/08/2019), 0.25 mg (08/22/2019) pegfilgrastim (NEULASTA) injection 6 mg, 6 mg, Subcutaneous, Once, 8 of 8 cycles Administration: 6 mg (05/11/2019), 6 mg (05/25/2019), 6 mg (06/08/2019), 6 mg (06/22/2019), 6 mg (07/13/2019), 6 mg (07/27/2019), 6 mg (08/10/2019) leucovorin 600 mg in dextrose 5 % 250 mL infusion, 640 mg, Intravenous,  Once, 12 of 12 cycles Administration: 600 mg (03/01/2019), 600 mg (03/15/2019), 640 mg (03/27/2019), 640 mg (04/10/2019), 640 mg (05/09/2019), 640 mg (05/23/2019), 640 mg (06/06/2019), 640 mg (06/20/2019), 640 mg (07/25/2019), 640 mg (07/11/2019), 640 mg (08/08/2019), 640 mg (08/22/2019) oxaliplatin (ELOXATIN) 135 mg in dextrose 5 % 500 mL chemo infusion, 85 mg/m2 = 135 mg, Intravenous,  Once, 10 of 10 cycles Dose modification: 68 mg/m2 (80 % of original dose 85 mg/m2, Cycle 5, Reason: Provider Judgment), 68 mg/m2 (original dose 85 mg/m2, Cycle 6, Reason: Provider Judgment), 51 mg/m2 (60 % of original dose 85 mg/m2, Cycle 10, Reason: Other (see comments), Comment: neuropathy) Administration: 135 mg (03/01/2019), 135 mg (03/15/2019), 135 mg  (03/27/2019), 135 mg (04/10/2019), 110 mg (05/09/2019), 110 mg (05/23/2019), 110 mg (06/06/2019), 110 mg (06/20/2019), 80 mg (07/25/2019), 110 mg (07/11/2019) fluorouracil (ADRUCIL) chemo injection 650 mg, 400 mg/m2 = 650 mg, Intravenous,  Once, 12 of 12 cycles Administration: 650 mg (03/01/2019), 650 mg (03/15/2019), 650 mg (03/27/2019), 650 mg (04/10/2019), 650 mg (05/09/2019), 650 mg (05/23/2019), 650 mg (06/06/2019), 650 mg (06/20/2019), 650 mg (07/25/2019), 650 mg (07/11/2019), 650 mg (08/08/2019), 650 mg (08/22/2019) fluorouracil (ADRUCIL) 3,850 mg in sodium chloride 0.9 % 73 mL chemo infusion, 2,400 mg/m2 = 3,850 mg, Intravenous, 1 Day/Dose, 12 of 12 cycles Administration: 3,850 mg (03/01/2019), 3,850 mg (03/15/2019), 3,850 mg (03/27/2019), 3,850 mg (04/10/2019), 3,850 mg (05/09/2019), 3,850 mg (05/23/2019), 3,850 mg (06/06/2019), 3,850 mg (06/20/2019), 3,850 mg (07/25/2019), 3,850 mg (07/11/2019), 3,850 mg (08/08/2019), 3,850 mg (08/22/2019)  for chemotherapy treatment.        INTERVAL HISTORY:  Andrea Simon 64 y.o. female seen for follow-up of her colon cancer.  She reported worsening of numbness in the hands.  Her fingertips feel like sandpaper.  Numbness in the feet has been stable.  She also reports feeling tight in her extremities.  Denies any pins-and-needles sensation.  Denies any change in bowel habits.  No bleeding per rectum or melena noted.  No new onset abdominal pains.  REVIEW OF SYSTEMS:  Review of Systems  Neurological: Positive for numbness.  Psychiatric/Behavioral: Positive for sleep disturbance.  All other systems reviewed and are negative.    PAST MEDICAL/SURGICAL HISTORY:  Past Medical History:  Diagnosis Date  . Hyperlipidemia   . Insomnia due to anxiety and fear 01/27/2019  . Multinodular goiter   . Prediabetes  Past Surgical History:  Procedure Laterality Date  . BIOPSY  12/07/2018   Procedure: BIOPSY;  Surgeon: Rogene Houston, MD;  [facility];  Service: Endoscopy;;  sigmoid colon    . COLON RESECTION N/A 01/09/2019   Procedure: LAPAROSCOPIC RIGHT HEMICOLECTOMY;  Surgeon: Virl Cagey, MD;  Location: [facility];  Service: General;  Laterality: N/A;  . COLONOSCOPY N/A 12/07/2018   Procedure: COLONOSCOPY;  Surgeon: Rogene Houston, MD;  Location: [facility];  Service: Endoscopy;  Laterality: N/A;  12:45  . DILATION AND CURETTAGE OF UTERUS  2006  . FNA thyroid  2011 and 2019   benign  . POLYPECTOMY  12/07/2018   Procedure: POLYPECTOMY;  Surgeon: Rogene Houston, MD;  Location: [facility];  Service: Endoscopy;;  splenic flexure (CS x1)  . PORTACATH PLACEMENT Left 02/15/2019   Procedure: INSERTION PORT-A-CATH;  Surgeon: Virl Cagey, MD;  Location: [facility];  Service: General;  Laterality: Left;  . TUBAL LIGATION  1992     SOCIAL HISTORY:  Social History   Socioeconomic History  . Marital status: Married    Spouse name: Not on file  . Number of children: 1  . Years of education: Not on file  . Highest education level: Not on file  Occupational History  . Occupation: Theme park manager - [employer]   Tobacco Use  . Smoking status: Current Every Day Smoker    Packs/day: 0.75    Years: 45.00    Pack years: 33.75    Types: Cigarettes  . Smokeless tobacco: Never Used  Substance and Sexual Activity  . Alcohol use: Yes    Alcohol/week: 0.0 standard drinks    Comment: occasionally  . Drug use: No  . Sexual activity: Yes  Other Topics Concern  . Not on file  Social History Narrative  . Not on file   Social Determinants of Health   Financial Resource Strain:   . Difficulty of Paying Living Expenses: Not on file  Food Insecurity:   . Worried About Charity fundraiser in the Last Year: Not on file  . Ran Out of Food in the Last Year: Not on file  Transportation Needs:   . Lack of Transportation (Medical): Not on file  . Lack of Transportation (Non-Medical): Not on file  Physical Activity:   . Days of Exercise per Week: Not on file  . Minutes of  Exercise per Session: Not on file  Stress:   . Feeling of Stress : Not on file  Social Connections:   . Frequency of Communication with Friends and Family: Not on file  . Frequency of Social Gatherings with Friends and Family: Not on file  . Attends Religious Services: Not on file  . Active Member of Clubs or Organizations: Not on file  . Attends Archivist Meetings: Not on file  . Marital Status: Not on file  Intimate Partner Violence:   . Fear of Current or Ex-Partner: Not on file  . Emotionally Abused: Not on file  . Physically Abused: Not on file  . Sexually Abused: Not on file    FAMILY HISTORY:  Family History  Problem Relation Age of Onset  . Hypertension Mother        AAA  . Coronary artery disease Mother   . Hyperlipidemia Mother   . Cancer Mother        lung  . Hypertension Father   . Cancer Brother     CURRENT MEDICATIONS:  Outpatient  Encounter Medications as of 12/25/2019  Medication Sig Note  . aspirin EC 81 MG tablet Take 1 tablet (81 mg total) by mouth every evening.   . Calcium Carbonate-Vitamin D (CALCIUM 600+D) 600-400 MG-UNIT tablet Take 1 tablet by mouth 2 (two) times a week. Sunday and Wednesday evenings   . pravastatin (PRAVACHOL) 40 MG tablet TAKE 1 TABLET BY MOUTH IN  THE EVENING 10/11/2019: Takes 4 days per week  . acetaminophen (TYLENOL) 500 MG tablet Take 1,000 mg by mouth every 6 (six) hours as needed for moderate pain.    Marland Kitchen gabapentin (NEURONTIN) 100 MG capsule Take 1 capsule (100 mg total) by mouth 3 (three) times daily.   . [DISCONTINUED] ergocalciferol (VITAMIN D2) 1.25 MG (50000 UT) capsule Take 1 capsule (50,000 Units total) by mouth once a week.    Facility-Administered Encounter Medications as of 12/25/2019  Medication  . sodium chloride flush (NS) 0.9 % injection 10 mL    ALLERGIES:  No Known Allergies   PHYSICAL EXAM:  ECOG Performance status: 1  Vitals:   12/25/19 1117  BP: (!) 142/82  Pulse: 78  Resp: 18  Temp:  97.9 F (36.6 C)  SpO2: 100%   Filed Weights   12/25/19 1117  Weight: 133 lb 9.6 oz (60.6 kg)    Physical Exam Constitutional:      Appearance: Normal appearance.  Cardiovascular:     Rate and Rhythm: Normal rate and regular rhythm.     Pulses: Normal pulses.     Heart sounds: Normal heart sounds.  Pulmonary:     Effort: Pulmonary effort is normal.     Breath sounds: Normal breath sounds.  Abdominal:     General: There is no distension.     Palpations: Abdomen is soft. There is no mass.  Musculoskeletal:        General: Normal range of motion.     Cervical back: Normal range of motion.  Skin:    General: Skin is warm and dry.  Neurological:     General: No focal deficit present.     Mental Status: She is alert and oriented to person, place, and time.  Psychiatric:        Mood and Affect: Mood normal.        Behavior: Behavior normal.      LABORATORY DATA:  I have reviewed the labs as listed.  CBC    Component Value Date/Time   WBC 3.7 (L) 12/18/2019 1018   RBC 3.57 (L) 12/18/2019 1018   HGB 12.4 12/18/2019 1018   HGB 13.5 08/18/2018 0758   HCT 35.8 (L) 12/18/2019 1018   HCT 37.2 08/18/2018 0758   PLT 179 12/18/2019 1018   PLT 259 08/18/2018 0758   MCV 100.3 (H) 12/18/2019 1018   MCV 104 (H) 08/18/2018 0758   MCH 34.7 (H) 12/18/2019 1018   MCHC 34.6 12/18/2019 1018   RDW 17.1 (H) 12/18/2019 1018   RDW 13.0 08/18/2018 0758   LYMPHSABS 1.9 12/18/2019 1018   LYMPHSABS 3.5 (H) 08/31/2017 0955   MONOABS 0.2 12/18/2019 1018   EOSABS 0.0 12/18/2019 1018   EOSABS 0.1 08/31/2017 0955   BASOSABS 0.0 12/18/2019 1018   BASOSABS 0.0 08/31/2017 0955   CMP Latest Ref Rng & Units 12/18/2019 09/12/2019 08/22/2019  Glucose 70 - 99 mg/dL 121(H) 99 164(H)  BUN 8 - 23 mg/dL 6(L) 9 10  Creatinine 0.44 - 1.00 mg/dL 0.63 0.71 0.66  Sodium 135 - 145 mmol/L 140 140 138  Potassium 3.5 -  5.1 mmol/L 3.2(L) 3.8 3.2(L)  Chloride 98 - 111 mmol/L 102 104 105  CO2 22 - 32 mmol/L '26  26 22  ' Calcium 8.9 - 10.3 mg/dL 9.8 10.1 9.4  Total Protein 6.5 - 8.1 g/dL 7.3 8.0 7.2  Total Bilirubin 0.3 - 1.2 mg/dL 1.2 1.7(H) 0.9  Alkaline Phos 38 - 126 U/L 86 120 115  AST 15 - 41 U/L 27 32 32  ALT 0 - 44 U/L '16 20 19   ' I have independently reviewed her scans.    ASSESSMENT & PLAN:   Colon cancer ([place]) 1.  Stage III (PT4BPN1A) transverse colon adenocarcinoma: -Laparoscopic right colectomy on 01/09/2019, 1/25 lymph nodes positive, no tumor deposits, no perineural invasion, grade 2, tumor extending through serosa into adherent omentum, margins negative. -PET scan on 01/31/2019 shows focal hypermetabolic activity identified in the ileocolic anastomosis with SUV of 9.  This could be related to postsurgical changes.  Follow-up as needed. -12 cycles of FOLFOX from 03/01/2019 through 08/22/2019 (oxaliplatin discontinued after cycle 10). -CT abdomen and pelvis on 09/10/2019 did not show any evidence of residual or recurrent disease. -Her CEA was 7.53 months ago.  We discussed the labs today.  CEA is 7.7. -She does not have any clinical signs or symptoms of recurrence at this time.  Physical examination was normal. -I have recommended close surveillance in 3 months with repeat CEA level.  We will also repeat a CT scan of the abdomen and pelvis prior to next visit.  If there is significant elevation of the CEA, will consider PET scan if CT scans are nondiagnostic.  2.  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  MSI was high by PCR.  BRAF testing was negative. -MLH1 hyper methylation was present.  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  3.  Peripheral neuropathy: -This is oxaliplatin induced. -She reports numbness has gotten worse in the fingertips.  They feel like sandpaper.  Numbness in the feet has been stable. -We will start her on gabapentin 100 mg 3 times a day.  We discussed the side effects in detail.  We will titrate up as tolerated.  4.  Vitamin D deficiency: -Vitamin  D level in June was low at 14.8.  She is taking vitamin D 50,000 units weekly.  We will check another level prior to next visit.  5.  Smoking history: -She is a 40+ pack year smoker. -CT low-dose on 12/05/2019 shows lung RADS 2 with emphysema.    Derek Jack, MD  [place] [phone]

## 2019-12-25 NOTE — Patient Instructions (Addendum)
[place] at [facility] Discharge Instructions  You were seen today by Dr. Delton Coombes. He went over your recent lab results. He will see you back in 3 months for labs, CT scan and follow up.  He will send in a new prescription for Gabapentin to help with the hand and foot discomfort. He will start you on the lowest dose and you can take it 3 times a day.  Thank you for choosing [place] at [facility] to provide your oncology and hematology care.  To afford each patient quality time with our provider, please arrive at least 15 minutes before your scheduled appointment time.   If you have a lab appointment with the [facility] please come in thru the  Main Entrance and check in at the main information desk  You need to re-schedule your appointment should you arrive 10 or more minutes late.  We strive to give you quality time with our providers, and arriving late affects you and other patients whose appointments are after yours.  Also, if you no show three or more times for appointments you may be dismissed from the clinic at the providers discretion.     Again, thank you for choosing [facility].  Our hope is that these requests will decrease the amount of time that you wait before being seen by our physicians.       _____________________________________________________________  Should you have questions after your visit to [facility], please contact our office at [phone] between the hours of 8:00 a.m. and 4:30 p.m.  Voicemails left after 4:00 p.m. will not be returned until the following business day.  For prescription refill requests, have your pharmacy contact our office and allow 72 hours.    Cancer Center Support Programs:   > Cancer Support Group  2nd Tuesday of the month 1pm-2pm, Journey Room

## 2020-03-06 IMAGING — CT CT CHEST LUNG CANCER SCREENING LOW DOSE W/O CM
1 of 2 series · 10 of 20 positions shown, 13 images · non-contrast
Comparison: 09/02/2018.

CLINICAL DATA: Current smoker 41 pack-year history.

EXAM:
CT CHEST WITHOUT CONTRAST LOW-DOSE FOR LUNG CANCER SCREENING
TECHNIQUE: Multidetector CT imaging of the chest was performed following the
standard protocol without IV contrast.

[ct lung segmentation data · axial · 0.55mm/px · z∈[-287,-287]mm · 10 of 249 frames shown]
[frame 1/249  mediastinal]
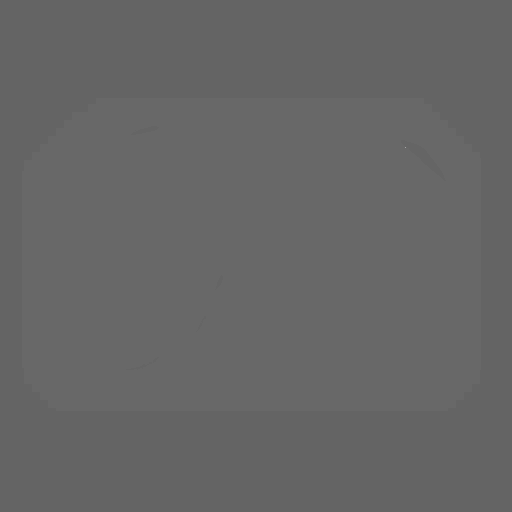
[frame 1/249  lung]
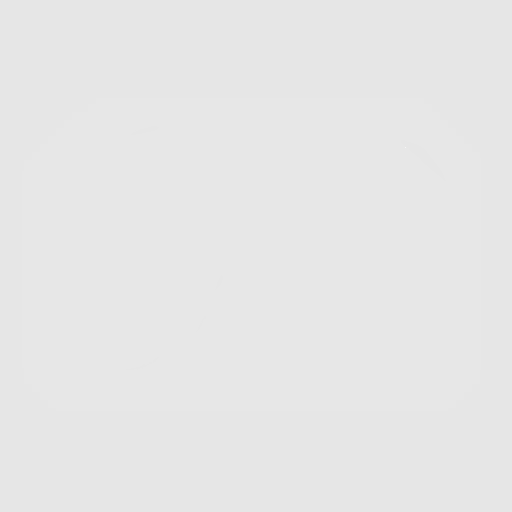
[frame 28/249  lung]
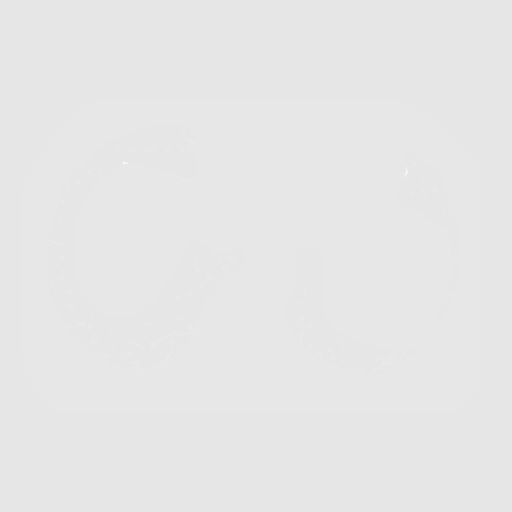
[frame 56/249  lung]
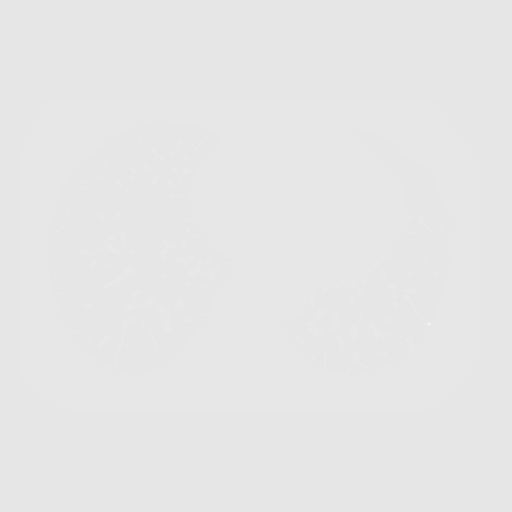
[frame 83/249  lung]
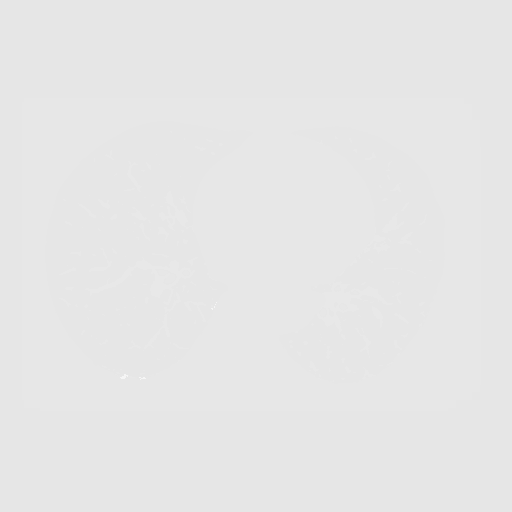
[frame 111/249  mediastinal]
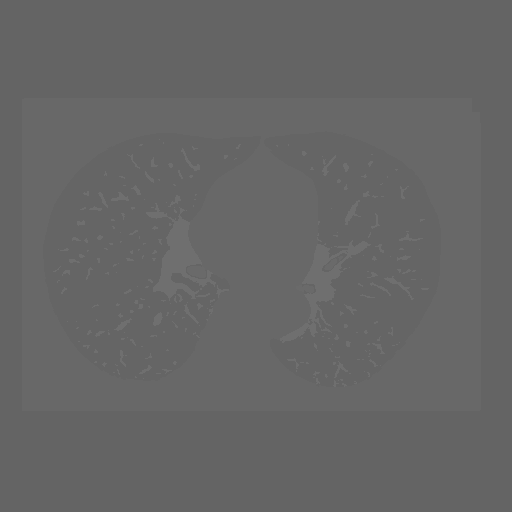
[frame 111/249  lung]
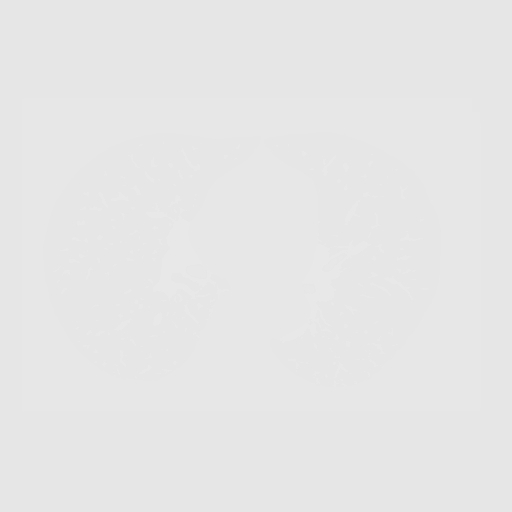
[frame 138/249  lung]
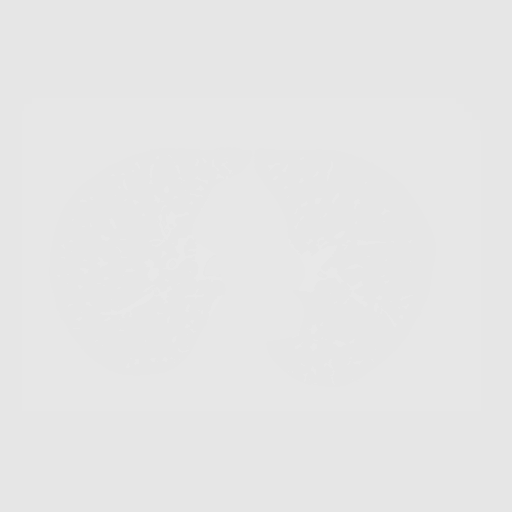
[frame 166/249  lung]
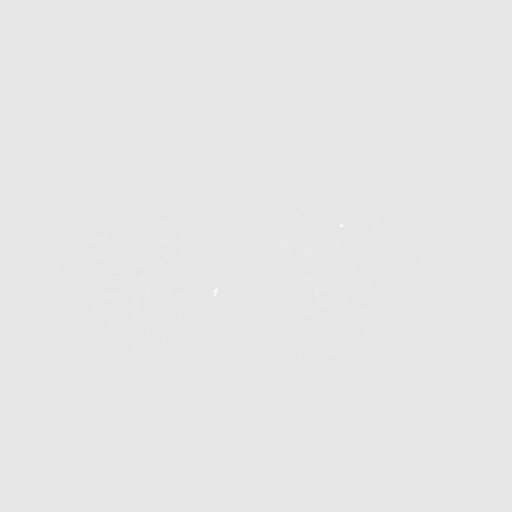
[frame 193/249  lung]
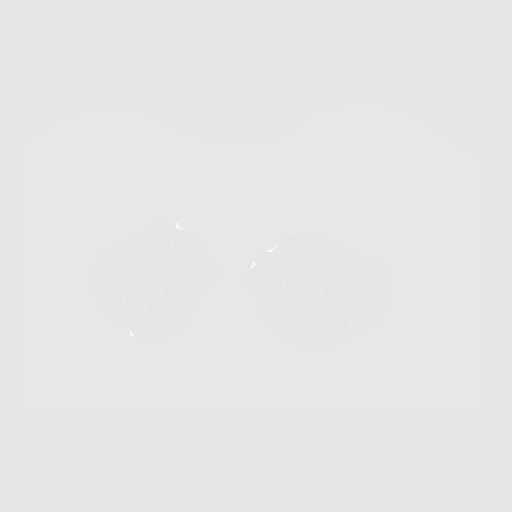
[frame 221/249  mediastinal]
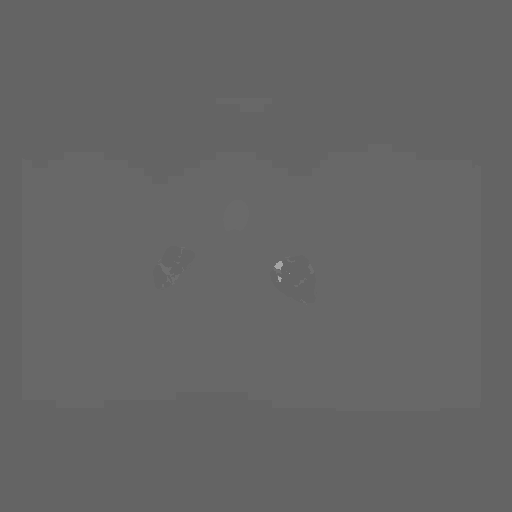
[frame 221/249  lung]
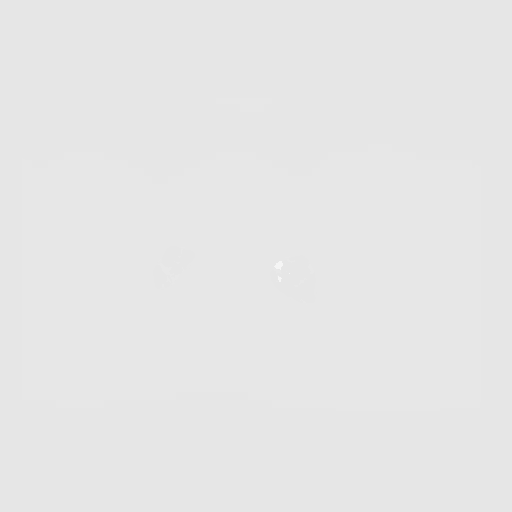
[frame 249/249  lung]
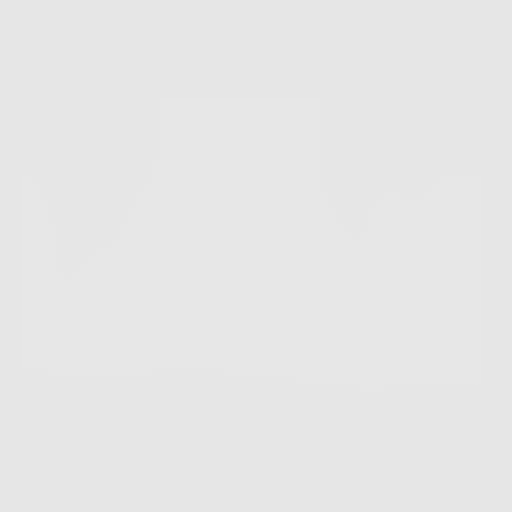

[10 of 20 positions shown; findings below may reference images not displayed]

FINDINGS: Cardiovascular: Left IJ Port-A-Cath terminates in the SVC.
Atherosclerotic calcification of the aorta and coronary arteries.
Heart is at the upper limits of normal in size. No pericardial
effusion.

Mediastinum/Nodes: No pathologically enlarged mediastinal or
axillary lymph nodes. Hilar regions are difficult to definitively
evaluate without IV contrast but appear grossly unremarkable.
Esophagus is grossly unremarkable.

Lungs/Pleura: Biapical pleuroparenchymal scarring. Centrilobular and
paraseptal emphysema. Mild scattered pulmonary parenchymal scarring.
Pulmonary nodules measure 5.2 mm or less in size, as before. No new
or worrisome pulmonary nodules. No pleural fluid. Airway is
unremarkable.

Upper Abdomen: Visualized portions of the liver, adrenal glands,
kidneys, spleen, pancreas, stomach and bowel are grossly
unremarkable.

Musculoskeletal: No worrisome lytic or sclerotic lesions.
IMPRESSION: 1. Lung-RADS 2, benign appearance or behavior. Continue annual
screening with low-dose chest CT without contrast in 12 months.
2. Aortic atherosclerosis (UIT9J-170.0). Coronary artery
calcification.
3.  Emphysema ([id number].C).

## 2020-03-19 ENCOUNTER — Other Ambulatory Visit (HOSPITAL_COMMUNITY): Payer: Self-pay | Admitting: *Deleted

## 2020-03-19 DIAGNOSIS — C184 Malignant neoplasm of transverse colon: Secondary | ICD-10-CM

## 2020-03-25 ENCOUNTER — Ambulatory Visit (HOSPITAL_COMMUNITY)
Admission: RE | Admit: 2020-03-25 | Discharge: 2020-03-25 | Disposition: A | Discharge status: Home / Self Care | Payer: 59 | Source: Ambulatory Visit | Attending: Hematology | Admitting: Hematology

## 2020-03-25 ENCOUNTER — Encounter (HOSPITAL_COMMUNITY): Payer: Self-pay

## 2020-03-25 ENCOUNTER — Inpatient Hospital Stay (HOSPITAL_COMMUNITY): Payer: 59

## 2020-03-25 ENCOUNTER — Other Ambulatory Visit: Payer: Self-pay

## 2020-03-25 VITALS — BP 165/87 | HR 89 | Temp 97.7°F | Resp 18

## 2020-03-25 DIAGNOSIS — Z596 Low income: Secondary | ICD-10-CM | POA: Insufficient documentation

## 2020-03-25 DIAGNOSIS — E559 Vitamin D deficiency, unspecified: Secondary | ICD-10-CM | POA: Insufficient documentation

## 2020-03-25 DIAGNOSIS — C184 Malignant neoplasm of transverse colon: Secondary | ICD-10-CM | POA: Insufficient documentation

## 2020-03-25 DIAGNOSIS — Z8349 Family history of other endocrine, nutritional and metabolic diseases: Secondary | ICD-10-CM | POA: Diagnosis not present

## 2020-03-25 DIAGNOSIS — Z95828 Presence of other vascular implants and grafts: Secondary | ICD-10-CM

## 2020-03-25 DIAGNOSIS — R05 Cough: Secondary | ICD-10-CM | POA: Insufficient documentation

## 2020-03-25 DIAGNOSIS — Z8249 Family history of ischemic heart disease and other diseases of the circulatory system: Secondary | ICD-10-CM | POA: Insufficient documentation

## 2020-03-25 DIAGNOSIS — F17210 Nicotine dependence, cigarettes, uncomplicated: Secondary | ICD-10-CM | POA: Insufficient documentation

## 2020-03-25 DIAGNOSIS — G629 Polyneuropathy, unspecified: Secondary | ICD-10-CM | POA: Insufficient documentation

## 2020-03-25 DIAGNOSIS — Z79899 Other long term (current) drug therapy: Secondary | ICD-10-CM | POA: Insufficient documentation

## 2020-03-25 DIAGNOSIS — R200 Anesthesia of skin: Secondary | ICD-10-CM | POA: Insufficient documentation

## 2020-03-25 DIAGNOSIS — R110 Nausea: Secondary | ICD-10-CM | POA: Diagnosis not present

## 2020-03-25 DIAGNOSIS — Z801 Family history of malignant neoplasm of trachea, bronchus and lung: Secondary | ICD-10-CM | POA: Diagnosis not present

## 2020-03-25 DIAGNOSIS — Z809 Family history of malignant neoplasm, unspecified: Secondary | ICD-10-CM | POA: Diagnosis not present

## 2020-03-25 LAB — COMPREHENSIVE METABOLIC PANEL
ALT: 20 U/L (ref 0–44)
AST: 31 U/L (ref 15–41)
Albumin: 4.7 g/dL (ref 3.5–5.0)
Alkaline Phosphatase: 75 U/L (ref 38–126)
Anion gap: 9 (ref 5–15)
BUN: 8 mg/dL (ref 8–23)
CO2: 25 mmol/L (ref 22–32)
Calcium: 9.7 mg/dL (ref 8.9–10.3)
Chloride: 104 mmol/L (ref 98–111)
Creatinine, Ser: 0.67 mg/dL (ref 0.44–1.00)
GFR calc Af Amer: >60 mL/min (ref >60)
GFR calc non Af Amer: >60 mL/min (ref >60)
Glucose, Bld: 118 mg/dL — ABNORMAL HIGH (ref 70–99)
Potassium: 3.6 mmol/L (ref 3.5–5.1)
Sodium: 138 mmol/L (ref 135–145)
Total Bilirubin: 1.7 mg/dL — ABNORMAL HIGH (ref 0.3–1.2)
Total Protein: 7.4 g/dL (ref 6.5–8.1)

## 2020-03-25 LAB — CBC WITH DIFFERENTIAL/PLATELET
Abs Immature Granulocytes: 0.01 10*3/uL (ref 0.00–0.07)
Basophils Absolute: 0 10*3/uL (ref 0.0–0.1)
Basophils Relative: 0 %
Eosinophils Absolute: 0 10*3/uL (ref 0.0–0.5)
Eosinophils Relative: 1 %
HCT: 34.9 % — ABNORMAL LOW (ref 36.0–46.0)
Hemoglobin: 12.6 g/dL (ref 12.0–15.0)
Immature Granulocytes: 0 %
Lymphocytes Relative: 51 %
Lymphs Abs: 2.6 10*3/uL (ref 0.7–4.0)
MCH: 40.4 pg — ABNORMAL HIGH (ref 26.0–34.0)
MCHC: 36.1 g/dL — ABNORMAL HIGH (ref 30.0–36.0)
MCV: 111.9 fL — ABNORMAL HIGH (ref 80.0–100.0)
Monocytes Absolute: 0.2 10*3/uL (ref 0.1–1.0)
Monocytes Relative: 4 %
Neutro Abs: 2.3 10*3/uL (ref 1.7–7.7)
Neutrophils Relative %: 44 %
Platelets: 163 10*3/uL (ref 150–400)
RBC: 3.12 MIL/uL — ABNORMAL LOW (ref 3.87–5.11)
RDW: 13.7 % (ref 11.5–15.5)
WBC: 5.2 10*3/uL (ref 4.0–10.5)
nRBC: 0 % (ref 0.0–0.2)

## 2020-03-25 LAB — VITAMIN D 25 HYDROXY (VIT D DEFICIENCY, FRACTURES): Vit D, 25-Hydroxy: 24.96 ng/mL — ABNORMAL LOW (ref 30–100)

## 2020-03-25 MED ORDER — HEPARIN SOD (PORK) LOCK FLUSH 100 UNIT/ML IV SOLN
500.0000 [IU] | Freq: Once | INTRAVENOUS | Status: AC
  Administered 2020-03-25: 12:00:00 500 [IU] via INTRAVENOUS

## 2020-03-25 MED ORDER — IOHEXOL 300 MG/ML  SOLN
100.0000 mL | Freq: Once | INTRAMUSCULAR | Status: AC | PRN
  Administered 2020-03-25: 14:00:00 100 mL via INTRAVENOUS

## 2020-03-25 MED ORDER — SODIUM CHLORIDE 0.9% FLUSH
10.0000 mL | INTRAVENOUS | Status: DC | PRN
  Administered 2020-03-25: 10 mL via INTRAVENOUS

## 2020-03-26 LAB — CEA: CEA: 7.2 ng/mL — ABNORMAL HIGH (ref 0.0–4.7)

## 2020-03-27 ENCOUNTER — Other Ambulatory Visit: Payer: Self-pay

## 2020-03-27 ENCOUNTER — Inpatient Hospital Stay (HOSPITAL_COMMUNITY): Payer: 59 | Attending: Hematology | Admitting: Hematology

## 2020-03-27 ENCOUNTER — Encounter (HOSPITAL_COMMUNITY): Payer: Self-pay | Admitting: Hematology

## 2020-03-27 VITALS — BP 115/72 | HR 86 | Temp 97.3°F | Resp 14 | Wt 134.9 lb

## 2020-03-27 DIAGNOSIS — C184 Malignant neoplasm of transverse colon: Secondary | ICD-10-CM

## 2020-03-27 NOTE — Progress Notes (Signed)
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  BRIEF ONCOLOGIC HISTORY:  Oncology History  Colon cancer ([place])  01/09/2019 Initial Diagnosis   Colon cancer ([place])   03/01/2019 -  Chemotherapy   The patient had palonosetron (ALOXI) injection 0.25 mg, 0.25 mg, Intravenous,  Once, 12 of 12 cycles Administration: 0.25 mg (03/01/2019), 0.25 mg (03/15/2019), 0.25 mg (03/27/2019), 0.25 mg (04/10/2019), 0.25 mg (05/09/2019), 0.25 mg (05/23/2019), 0.25 mg (06/06/2019), 0.25 mg (06/20/2019), 0.25 mg (07/25/2019), 0.25 mg (07/11/2019), 0.25 mg (08/08/2019), 0.25 mg (08/22/2019) pegfilgrastim (NEULASTA) injection 6 mg, 6 mg, Subcutaneous, Once, 8 of 8 cycles Administration: 6 mg (05/11/2019), 6 mg (05/25/2019), 6 mg (06/08/2019), 6 mg (06/22/2019), 6 mg (07/13/2019), 6 mg (07/27/2019), 6 mg (08/10/2019) leucovorin 600 mg in dextrose 5 % 250 mL infusion, 640 mg, Intravenous,  Once, 12 of 12 cycles Administration: 600 mg (03/01/2019), 600 mg (03/15/2019), 640 mg (03/27/2019), 640 mg (04/10/2019), 640 mg (05/09/2019), 640 mg (05/23/2019), 640 mg (06/06/2019), 640 mg (06/20/2019), 640 mg (07/25/2019), 640 mg (07/11/2019), 640 mg (08/08/2019), 640 mg (08/22/2019) oxaliplatin (ELOXATIN) 135 mg in dextrose 5 % 500 mL chemo infusion, 85 mg/m2 = 135 mg, Intravenous,  Once, 10 of 10 cycles Dose modification: 68 mg/m2 (80 % of original dose 85 mg/m2, Cycle 5, Reason: Provider Judgment), 68 mg/m2 (original dose 85 mg/m2, Cycle 6, Reason: Provider Judgment), 51 mg/m2 (60 % of original dose 85 mg/m2, Cycle 10, Reason: Other (see comments), Comment: neuropathy) Administration: 135 mg (03/01/2019), 135 mg (03/15/2019), 135 mg (03/27/2019), 135 mg (04/10/2019), 110  mg (05/09/2019), 110 mg (05/23/2019), 110 mg (06/06/2019), 110 mg (06/20/2019), 80 mg (07/25/2019), 110 mg (07/11/2019) fluorouracil (ADRUCIL) chemo injection 650 mg, 400 mg/m2 = 650 mg, Intravenous,  Once, 12 of 12 cycles Administration: 650 mg (03/01/2019), 650 mg (03/15/2019), 650 mg (03/27/2019), 650 mg (04/10/2019), 650 mg (05/09/2019), 650 mg (05/23/2019), 650 mg (06/06/2019), 650 mg (06/20/2019), 650 mg (07/25/2019), 650 mg (07/11/2019), 650 mg (08/08/2019), 650 mg (08/22/2019) fluorouracil (ADRUCIL) 3,850 mg in sodium chloride 0.9 % 73 mL chemo infusion, 2,400 mg/m2 = 3,850 mg, Intravenous, 1 Day/Dose, 12 of 12 cycles Administration: 3,850 mg (03/01/2019), 3,850 mg (03/15/2019), 3,850 mg (03/27/2019), 3,850 mg (04/10/2019), 3,850 mg (05/09/2019), 3,850 mg (05/23/2019), 3,850 mg (06/06/2019), 3,850 mg (06/20/2019), 3,850 mg (07/25/2019), 3,850 mg (07/11/2019), 3,850 mg (08/08/2019), 3,850 mg (08/22/2019)  for chemotherapy treatment.        INTERVAL HISTORY:  Andrea Simon 64 y.o. female seen for follow-up of colon cancer.  Denies any bleeding per rectum or melena.  No change in bowel habits.  She still has occasional nausea.  She is not vomiting.  Numbness in the feet slightly better.  In the fingers it has improved.  She has not taken gabapentin and she is very afraid of side effects.  REVIEW OF SYSTEMS:  Review of Systems  Respiratory: Positive for cough.   Gastrointestinal: Positive for nausea.  Neurological: Positive for numbness.  All other systems reviewed and are negative.    PAST MEDICAL/SURGICAL HISTORY:  Past Medical History:  Diagnosis Date  . Hyperlipidemia   . Insomnia due to anxiety and fear 01/27/2019  . Multinodular goiter   . Prediabetes    Past Surgical History:  Procedure Laterality  Date  . BIOPSY  12/07/2018   Procedure: BIOPSY;  Surgeon: Rogene Houston, MD;  [facility];  Service: Endoscopy;;  sigmoid colon  . COLON RESECTION N/A 01/09/2019   Procedure: LAPAROSCOPIC RIGHT  HEMICOLECTOMY;  Surgeon: Virl Cagey, MD;  Location: [facility] ORS;  Service: General;  Laterality: N/A;  . COLONOSCOPY N/A 12/07/2018   Procedure: COLONOSCOPY;  Surgeon: Rogene Houston, MD;  Location: [facility];  Service: Endoscopy;  Laterality: N/A;  12:45  . DILATION AND CURETTAGE OF UTERUS  2006  . FNA thyroid  2011 and 2019   benign  . POLYPECTOMY  12/07/2018   Procedure: POLYPECTOMY;  Surgeon: Rogene Houston, MD;  Location: [facility];  Service: Endoscopy;;  splenic flexure (CS x1)  . PORTACATH PLACEMENT Left 02/15/2019   Procedure: INSERTION PORT-A-CATH;  Surgeon: Virl Cagey, MD;  Location: [facility];  Service: General;  Laterality: Left;  . TUBAL LIGATION  1992     SOCIAL HISTORY:  Social History   Socioeconomic History  . Marital status: Married    Spouse name: Not on file  . Number of children: 1  . Years of education: Not on file  . Highest education level: Not on file  Occupational History  . Occupation: Theme park manager - [employer]   Tobacco Use  . Smoking status: Current Every Day Smoker    Packs/day: 0.75    Years: 45.00    Pack years: 33.75    Types: Cigarettes  . Smokeless tobacco: Never Used  Substance and Sexual Activity  . Alcohol use: Yes    Alcohol/week: 0.0 standard drinks    Comment: occasionally  . Drug use: No  . Sexual activity: Yes  Other Topics Concern  . Not on file  Social History Narrative  . Not on file   Social Determinants of Health   Financial Resource Strain:   . Difficulty of Paying Living Expenses:   Food Insecurity:   . Worried About Charity fundraiser in the Last Year:   . Arboriculturist in the Last Year:   Transportation Needs:   . Film/video editor (Medical):   Marland Kitchen Lack of Transportation (Non-Medical):   Physical Activity:   . Days of Exercise per Week:   . Minutes of Exercise per Session:   Stress:   . Feeling of Stress :   Social Connections:   . Frequency of Communication with Friends and  Family:   . Frequency of Social Gatherings with Friends and Family:   . Attends Religious Services:   . Active Member of Clubs or Organizations:   . Attends Archivist Meetings:   Marland Kitchen Marital Status:   Intimate Partner Violence:   . Fear of Current or Ex-Partner:   . Emotionally Abused:   Marland Kitchen Physically Abused:   . Sexually Abused:     FAMILY HISTORY:  Family History  Problem Relation Age of Onset  . Hypertension Mother        AAA  . Coronary artery disease Mother   . Hyperlipidemia Mother   . Cancer Mother        lung  . Hypertension Father   . Cancer Brother     CURRENT MEDICATIONS:  Outpatient Encounter Medications as of 03/27/2020  Medication Sig Note  . acetaminophen (TYLENOL) 500 MG tablet Take 1,000 mg by mouth every 6 (six) hours as needed for moderate pain.    Marland Kitchen aspirin EC 81 MG tablet Take 1 tablet (81  mg total) by mouth every evening.   . Calcium Carbonate-Vitamin D (CALCIUM 600+D) 600-400 MG-UNIT tablet Take 1 tablet by mouth 2 (two) times a week. Sunday and Wednesday evenings   . gabapentin (NEURONTIN) 100 MG capsule Take 1 capsule (100 mg total) by mouth 3 (three) times daily. (Patient not taking: Reported on 03/27/2020)   . pravastatin (PRAVACHOL) 40 MG tablet TAKE 1 TABLET BY MOUTH IN  THE EVENING 10/11/2019: Takes 4 days per week   Facility-Administered Encounter Medications as of 03/27/2020  Medication  . sodium chloride flush (NS) 0.9 % injection 10 mL    ALLERGIES:  No Known Allergies   PHYSICAL EXAM:  ECOG Performance status: 1  Vitals:   03/27/20 1422  BP: 115/72  Pulse: 86  Resp: 14  Temp: (!) 97.3 F (36.3 C)  SpO2: 100%   Filed Weights   03/27/20 1422  Weight: 134 lb 14.4 oz (61.2 kg)    Physical Exam Constitutional:      Appearance: Normal appearance.  Cardiovascular:     Rate and Rhythm: Normal rate and regular rhythm.     Pulses: Normal pulses.     Heart sounds: Normal heart sounds.  Pulmonary:     Effort: Pulmonary  effort is normal.     Breath sounds: Normal breath sounds.  Abdominal:     General: There is no distension.     Palpations: Abdomen is soft. There is no mass.  Musculoskeletal:        General: Normal range of motion.     Cervical back: Normal range of motion.  Skin:    General: Skin is warm and dry.  Neurological:     General: No focal deficit present.     Mental Status: She is alert and oriented to person, place, and time.  Psychiatric:        Mood and Affect: Mood normal.        Behavior: Behavior normal.      LABORATORY DATA:  I have reviewed the labs as listed.  CBC    Component Value Date/Time   WBC 5.2 03/25/2020 1216   RBC 3.12 (L) 03/25/2020 1216   HGB 12.6 03/25/2020 1216   HGB 13.5 08/18/2018 0758   HCT 34.9 (L) 03/25/2020 1216   HCT 37.2 08/18/2018 0758   PLT 163 03/25/2020 1216   PLT 259 08/18/2018 0758   MCV 111.9 (H) 03/25/2020 1216   MCV 104 (H) 08/18/2018 0758   MCH 40.4 (H) 03/25/2020 1216   MCHC 36.1 (H) 03/25/2020 1216   RDW 13.7 03/25/2020 1216   RDW 13.0 08/18/2018 0758   LYMPHSABS 2.6 03/25/2020 1216   LYMPHSABS 3.5 (H) 08/31/2017 0955   MONOABS 0.2 03/25/2020 1216   EOSABS 0.0 03/25/2020 1216   EOSABS 0.1 08/31/2017 0955   BASOSABS 0.0 03/25/2020 1216   BASOSABS 0.0 08/31/2017 0955   CMP Latest Ref Rng & Units 03/25/2020 12/18/2019 09/12/2019  Glucose 70 - 99 mg/dL 118(H) 121(H) 99  BUN 8 - 23 mg/dL 8 6(L) 9  Creatinine 0.44 - 1.00 mg/dL 0.67 0.63 0.71  Sodium 135 - 145 mmol/L 138 140 140  Potassium 3.5 - 5.1 mmol/L 3.6 3.2(L) 3.8  Chloride 98 - 111 mmol/L 104 102 104  CO2 22 - 32 mmol/L _0 Calcium 8.9 - 10.3 mg/dL 9.7 9.8 10.1  Total Protein 6.5 - 8.1 g/dL 7.4 7.3 8.0  Total Bilirubin 0.3 - 1.2 mg/dL 1.7(H) 1.2 1.7(H)  Alkaline Phos 38 - 126 U/L 75  86 120  AST 15 - 41 U/L 31 27 32  ALT 0 - 44 U/L _0 I have reviewed scans.    ASSESSMENT & PLAN:   Colon cancer ([facility]) 1.  Stage III (PT4BPN1A) transverse colon  adenocarcinoma: -Laparoscopic right colectomy on 01/09/2019, 1/25 lymph nodes positive, no tumor deposits, no perineural invasion, grade 2, tumor extending through serosa into adherent omentum, margins negative. -PET scan on 01/31/2019 shows focal hypermetabolic activity identified in the ileocolic anastomosis with SUV of 9. -12 cycles of FOLFOX from 03/01/2019 through 08/22/2019. -We reviewed results of the CTAP from 03/25/2020 which did not show any evidence of recurrence or metastatic disease in the abdomen or pelvis.  Trace fluid around the liver has resolved. -We reviewed her labs.  CEA improved to 7.2.  This has improved from 7.7 previously.  LFTs are normal except total bilirubin elevated at 1.7. -I have recommended surveillance every 3 months during first 2 years.  She will come back in 3 months with repeat CEA and physical exam.  2.  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  MSI was high by PCR.  BRAF testing was negative. -MLH1 hyper methylation was present.  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  3.  Peripheral neuropathy: -These oxaliplatin induced.  I gave her gabapentin at last visit.  However she did not take it as she was afraid of side effects. -Her neuropathy in the fingers has improved.  In the feet she has mild neuropathy with numbness.  No pains.  4.  Vitamin D deficiency: -She is taking calcium and vitamin D twice weekly.  Her vitamin D level was 24.  I have told her to take vitamin D 1000 units daily.  5.  Smoking history: -She is a 40+ pack year smoker. -CT low-dose on 12/05/2019 shows lung RADS 2 with emphysema.   Derek Jack, MD  [place] [phone]

## 2020-03-27 NOTE — Patient Instructions (Addendum)
[place] at [facility] Discharge Instructions  You were seen today by Dr. Delton Coombes. He went over your recent lab results.  Dr. Delton Coombes recommends taking OTC Vitamin D daily instead of just twice weekly. He will see you back in 3 months for labs and follow up.   Thank you for choosing [place] at [facility] to provide your oncology and hematology care.  To afford each patient quality time with our provider, please arrive at least 15 minutes before your scheduled appointment time.   If you have a lab appointment with the [facility] please come in thru the  Main Entrance and check in at the main information desk  You need to re-schedule your appointment should you arrive 10 or more minutes late.  We strive to give you quality time with our providers, and arriving late affects you and other patients whose appointments are after yours.  Also, if you no show three or more times for appointments you may be dismissed from the clinic at the providers discretion.     Again, thank you for choosing [facility].  Our hope is that these requests will decrease the amount of time that you wait before being seen by our physicians.       _____________________________________________________________  Should you have questions after your visit to [facility], please contact our office at [phone] between the hours of 8:00 a.m. and 4:30 p.m.  Voicemails left after 4:00 p.m. will not be returned until the following business day.  For prescription refill requests, have your pharmacy contact our office and allow 72 hours.    Cancer Center Support Programs:   > Cancer Support Group  2nd Tuesday of the month 1pm-2pm, Journey Room

## 2020-03-27 NOTE — Assessment & Plan Note (Signed)
1.  Stage III (PT4BPN1A) transverse colon adenocarcinoma: -Laparoscopic right colectomy on 01/09/2019, 1/25 lymph nodes positive, no tumor deposits, no perineural invasion, grade 2, tumor extending through serosa into adherent omentum, margins negative. -PET scan on 01/31/2019 shows focal hypermetabolic activity identified in the ileocolic anastomosis with SUV of 9. -12 cycles of FOLFOX from 03/01/2019 through 08/22/2019. -We reviewed results of the CTAP from 03/25/2020 which did not show any evidence of recurrence or metastatic disease in the abdomen or pelvis.  Trace fluid around the liver has resolved. -We reviewed her labs.  CEA improved to 7.2.  This has improved from 7.7 previously.  LFTs are normal except total bilirubin elevated at 1.7. -I have recommended surveillance every 3 months during first 2 years.  She will come back in 3 months with repeat CEA and physical exam.  2.  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  MSI was high by PCR.  BRAF testing was negative. -MLH1 hyper methylation was present.  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  3.  Peripheral neuropathy: -These oxaliplatin induced.  I gave her gabapentin at last visit.  However she did not take it as she was afraid of side effects. -Her neuropathy in the fingers has improved.  In the feet she has mild neuropathy with numbness.  No pains.  4.  Vitamin D deficiency: -She is taking calcium and vitamin D twice weekly.  Her vitamin D level was 24.  I have told her to take vitamin D 1000 units daily.  5.  Smoking history: -She is a 40+ pack year smoker. -CT low-dose on 12/05/2019 shows lung RADS 2 with emphysema.

## 2020-04-10 ENCOUNTER — Ambulatory Visit: Payer: 59 | Admitting: Family Medicine

## 2020-06-26 ENCOUNTER — Other Ambulatory Visit: Payer: Self-pay

## 2020-06-26 ENCOUNTER — Encounter (HOSPITAL_COMMUNITY): Payer: Self-pay

## 2020-06-26 ENCOUNTER — Inpatient Hospital Stay (HOSPITAL_COMMUNITY): Payer: 59 | Attending: Hematology

## 2020-06-26 DIAGNOSIS — Z801 Family history of malignant neoplasm of trachea, bronchus and lung: Secondary | ICD-10-CM | POA: Diagnosis not present

## 2020-06-26 DIAGNOSIS — Z8349 Family history of other endocrine, nutritional and metabolic diseases: Secondary | ICD-10-CM | POA: Diagnosis not present

## 2020-06-26 DIAGNOSIS — Z79899 Other long term (current) drug therapy: Secondary | ICD-10-CM | POA: Insufficient documentation

## 2020-06-26 DIAGNOSIS — G629 Polyneuropathy, unspecified: Secondary | ICD-10-CM | POA: Insufficient documentation

## 2020-06-26 DIAGNOSIS — R5383 Other fatigue: Secondary | ICD-10-CM | POA: Insufficient documentation

## 2020-06-26 DIAGNOSIS — Z809 Family history of malignant neoplasm, unspecified: Secondary | ICD-10-CM | POA: Diagnosis not present

## 2020-06-26 DIAGNOSIS — E559 Vitamin D deficiency, unspecified: Secondary | ICD-10-CM | POA: Insufficient documentation

## 2020-06-26 DIAGNOSIS — Z9049 Acquired absence of other specified parts of digestive tract: Secondary | ICD-10-CM | POA: Insufficient documentation

## 2020-06-26 DIAGNOSIS — C184 Malignant neoplasm of transverse colon: Secondary | ICD-10-CM | POA: Diagnosis present

## 2020-06-26 DIAGNOSIS — E785 Hyperlipidemia, unspecified: Secondary | ICD-10-CM | POA: Insufficient documentation

## 2020-06-26 DIAGNOSIS — F17210 Nicotine dependence, cigarettes, uncomplicated: Secondary | ICD-10-CM | POA: Insufficient documentation

## 2020-06-26 DIAGNOSIS — Z8249 Family history of ischemic heart disease and other diseases of the circulatory system: Secondary | ICD-10-CM | POA: Insufficient documentation

## 2020-06-26 LAB — CBC WITH DIFFERENTIAL/PLATELET
Abs Immature Granulocytes: 0.02 10*3/uL (ref 0.00–0.07)
Basophils Absolute: 0 10*3/uL (ref 0.0–0.1)
Basophils Relative: 1 %
Eosinophils Absolute: 0 10*3/uL (ref 0.0–0.5)
Eosinophils Relative: 1 %
HCT: 33.9 % — ABNORMAL LOW (ref 36.0–46.0)
Hemoglobin: 12.1 g/dL (ref 12.0–15.0)
Immature Granulocytes: 0 %
Lymphocytes Relative: 48 %
Lymphs Abs: 2.7 10*3/uL (ref 0.7–4.0)
MCH: 41.3 pg — ABNORMAL HIGH (ref 26.0–34.0)
MCHC: 35.7 g/dL (ref 30.0–36.0)
MCV: 115.7 fL — ABNORMAL HIGH (ref 80.0–100.0)
Monocytes Absolute: 0.2 10*3/uL (ref 0.1–1.0)
Monocytes Relative: 4 %
Neutro Abs: 2.5 10*3/uL (ref 1.7–7.7)
Neutrophils Relative %: 46 %
Platelets: 191 10*3/uL (ref 150–400)
RBC: 2.93 MIL/uL — ABNORMAL LOW (ref 3.87–5.11)
RDW: 13.3 % (ref 11.5–15.5)
WBC: 5.4 10*3/uL (ref 4.0–10.5)
nRBC: 0 % (ref 0.0–0.2)

## 2020-06-26 LAB — COMPREHENSIVE METABOLIC PANEL
ALT: 28 U/L (ref 0–44)
AST: 37 U/L (ref 15–41)
Albumin: 4.5 g/dL (ref 3.5–5.0)
Alkaline Phosphatase: 65 U/L (ref 38–126)
Anion gap: 10 (ref 5–15)
BUN: 10 mg/dL (ref 8–23)
CO2: 25 mmol/L (ref 22–32)
Calcium: 9.4 mg/dL (ref 8.9–10.3)
Chloride: 101 mmol/L (ref 98–111)
Creatinine, Ser: 0.69 mg/dL (ref 0.44–1.00)
GFR calc Af Amer: >60 mL/min (ref >60)
GFR calc non Af Amer: >60 mL/min (ref >60)
Glucose, Bld: 132 mg/dL — ABNORMAL HIGH (ref 70–99)
Potassium: 3.2 mmol/L — ABNORMAL LOW (ref 3.5–5.1)
Sodium: 136 mmol/L (ref 135–145)
Total Bilirubin: 1.8 mg/dL — ABNORMAL HIGH (ref 0.3–1.2)
Total Protein: 7.3 g/dL (ref 6.5–8.1)

## 2020-06-26 MED ORDER — HEPARIN SOD (PORK) LOCK FLUSH 100 UNIT/ML IV SOLN
500.0000 [IU] | Freq: Once | INTRAVENOUS | Status: AC
  Administered 2020-06-26: 500 [IU] via INTRAVENOUS

## 2020-06-26 MED ORDER — SODIUM CHLORIDE 0.9% FLUSH
10.0000 mL | INTRAVENOUS | Status: DC | PRN
  Administered 2020-06-26: 10 mL via INTRAVENOUS

## 2020-06-26 NOTE — Progress Notes (Signed)
Andrea Simon presented for Portacath access and flush.  Portacath located left chest wall accessed with 20gneedle. Blood return noted. Portacath flushed with 65ml NS and 500U/2ml Heparin and needle removed intact.  Procedure tolerated well and without incident.   Pt d/c clinic ambulatory.

## 2020-06-27 LAB — CEA: CEA: 6.9 ng/mL — ABNORMAL HIGH (ref 0.0–4.7)

## 2020-07-03 ENCOUNTER — Other Ambulatory Visit (HOSPITAL_COMMUNITY): Payer: Self-pay | Admitting: Family Medicine

## 2020-07-03 ENCOUNTER — Encounter: Payer: Self-pay | Admitting: Family Medicine

## 2020-07-03 ENCOUNTER — Ambulatory Visit (INDEPENDENT_AMBULATORY_CARE_PROVIDER_SITE_OTHER): Payer: 59 | Admitting: Family Medicine

## 2020-07-03 ENCOUNTER — Other Ambulatory Visit: Payer: Self-pay

## 2020-07-03 VITALS — BP 142/84 | HR 88 | Resp 16 | Ht 63.0 in | Wt 141.0 lb

## 2020-07-03 DIAGNOSIS — R7303 Prediabetes: Secondary | ICD-10-CM

## 2020-07-03 DIAGNOSIS — G629 Polyneuropathy, unspecified: Secondary | ICD-10-CM

## 2020-07-03 DIAGNOSIS — E8881 Metabolic syndrome: Secondary | ICD-10-CM | POA: Diagnosis not present

## 2020-07-03 DIAGNOSIS — I10 Essential (primary) hypertension: Secondary | ICD-10-CM

## 2020-07-03 DIAGNOSIS — F17200 Nicotine dependence, unspecified, uncomplicated: Secondary | ICD-10-CM | POA: Diagnosis not present

## 2020-07-03 DIAGNOSIS — Z1231 Encounter for screening mammogram for malignant neoplasm of breast: Secondary | ICD-10-CM

## 2020-07-03 DIAGNOSIS — E559 Vitamin D deficiency, unspecified: Secondary | ICD-10-CM

## 2020-07-03 DIAGNOSIS — E782 Mixed hyperlipidemia: Secondary | ICD-10-CM

## 2020-07-03 LAB — POCT GLYCOSYLATED HEMOGLOBIN (HGB A1C): Hemoglobin A1C: 5.7 % — AB (ref 4.0–5.6)

## 2020-07-03 MED ORDER — AMLODIPINE BESYLATE 2.5 MG PO TABS: 2.5000 mg | ORAL_TABLET | Freq: Every day | ORAL | 3 refills | Status: DC

## 2020-07-03 NOTE — Patient Instructions (Addendum)
Annual physical exam with Pap in 2 months with MD call if you need me sooner.  New for blood pressure is amlodipine 2.5 mg daily.  Mammogram to be scheduled at checkout, 4 pm appt requested  Now you're smoking 10 cigarettes/day.  Please cut down by 1 cigarette each week as able so that and then over the next 8 weeks you should be down by 6 to 8 cigarettes.  Stopping smoking would help to reduce your risk of heart disease stroke and all types of cancer as well as lung failure.  It is good that you are still trying to quit.  Please start committing to physical activity 30 for 30 minutes total 5 days/week.  Breaking this up into 310-minute sessions is effective.  Please continue to commit to reducing obvious sugar intake in any form other than natural sugar as in fresh fruit, and continue  portion control of starches, eg  potatoes rice pasta and bread.  Fasting lipid, cmp and eGFR, tSH, vit D 1 weeek before your next visit  GlycoHb in office today to assess blood sugar  Thanks for choosing [facility], we consider it a privelige to serve you.

## 2020-07-04 ENCOUNTER — Inpatient Hospital Stay (HOSPITAL_BASED_OUTPATIENT_CLINIC_OR_DEPARTMENT_OTHER): Payer: 59 | Admitting: Hematology

## 2020-07-04 ENCOUNTER — Encounter (HOSPITAL_COMMUNITY): Payer: Self-pay | Admitting: Hematology

## 2020-07-04 VITALS — BP 140/79 | HR 78 | Temp 96.9°F | Resp 18 | Wt 141.1 lb

## 2020-07-04 DIAGNOSIS — C184 Malignant neoplasm of transverse colon: Secondary | ICD-10-CM

## 2020-07-04 NOTE — Patient Instructions (Addendum)
[place] at [facility] Discharge Instructions  You were seen today by Dr. Delton Coombes. He went over your recent results. Increase your daily intake of fruits to increase your potassium levels. You will be scheduled for a CT scan of your abdomen before your next visit, as well as a colonoscopy. Dr. Delton Coombes will see you back in 3 months for labs and follow up.   Thank you for choosing [place] at [facility] to provide your oncology and hematology care.  To afford each patient quality time with our provider, please arrive at least 15 minutes before your scheduled appointment time.   If you have a lab appointment with the [facility] please come in thru the Main Entrance and check in at the main information desk  You need to re-schedule your appointment should you arrive 10 or more minutes late.  We strive to give you quality time with our providers, and arriving late affects you and other patients whose appointments are after yours.  Also, if you no show three or more times for appointments you may be dismissed from the clinic at the providers discretion.     Again, thank you for choosing [facility].  Our hope is that these requests will decrease the amount of time that you wait before being seen by our physicians.       _____________________________________________________________  Should you have questions after your visit to [facility], please contact our office at [phone] between the hours of 8:00 a.m. and 4:30 p.m.  Voicemails left after 4:00 p.m. will not be returned until the following business day.  For prescription refill requests, have your pharmacy contact our office and allow 72 hours.    Cancer Center Support Programs:   > Cancer Support Group  2nd Tuesday of the month 1pm-2pm, Journey Room

## 2020-07-04 NOTE — Progress Notes (Signed)
[place], [place] [address]   CLINIC:  Medical Oncology/Hematology  PCP:  Fayrene Helper, MD [address] / [place] [address] [phone]   REASON FOR VISIT:  Follow-up for stage III transverse colon adenocarcinoma  PRIOR THERAPY:  1. Laparoscopic right colectomy on 01/09/2019 2. FOLFOX x 12 cycles from 03/01/2019 to 08/22/2019  NGS Results: Not done  CURRENT THERAPY: Observation  BRIEF ONCOLOGIC HISTORY:  Oncology History  Colon cancer ([facility])  01/09/2019 Initial Diagnosis   Colon cancer ([facility])   03/01/2019 -  Chemotherapy   The patient had palonosetron (ALOXI) injection 0.25 mg, 0.25 mg, Intravenous,  Once, 12 of 12 cycles Administration: 0.25 mg (03/01/2019), 0.25 mg (03/15/2019), 0.25 mg (03/27/2019), 0.25 mg (04/10/2019), 0.25 mg (05/09/2019), 0.25 mg (05/23/2019), 0.25 mg (06/06/2019), 0.25 mg (06/20/2019), 0.25 mg (07/25/2019), 0.25 mg (07/11/2019), 0.25 mg (08/08/2019), 0.25 mg (08/22/2019) pegfilgrastim (NEULASTA) injection 6 mg, 6 mg, Subcutaneous, Once, 8 of 8 cycles Administration: 6 mg (05/11/2019), 6 mg (05/25/2019), 6 mg (06/08/2019), 6 mg (06/22/2019), 6 mg (07/13/2019), 6 mg (07/27/2019), 6 mg (08/10/2019) leucovorin 600 mg in dextrose 5 % 250 mL infusion, 640 mg, Intravenous,  Once, 12 of 12 cycles Administration: 600 mg (03/01/2019), 600 mg (03/15/2019), 640 mg (03/27/2019), 640 mg (04/10/2019), 640 mg (05/09/2019), 640 mg (05/23/2019), 640 mg (06/06/2019), 640 mg (06/20/2019), 640 mg (07/25/2019), 640 mg (07/11/2019), 640 mg (08/08/2019), 640 mg (08/22/2019) oxaliplatin (ELOXATIN) 135 mg in dextrose 5 % 500 mL chemo infusion, 85 mg/m2 = 135 mg, Intravenous,  Once, 10 of 10 cycles Dose modification: 68 mg/m2 (80 % of original dose 85 mg/m2, Cycle 5, Reason: Provider Judgment), 68 mg/m2 (original dose 85 mg/m2, Cycle 6, Reason: Provider Judgment), 51 mg/m2 (60 % of original dose 85 mg/m2, Cycle 10, Reason: Other (see comments), Comment:  neuropathy) Administration: 135 mg (03/01/2019), 135 mg (03/15/2019), 135 mg (03/27/2019), 135 mg (04/10/2019), 110 mg (05/09/2019), 110 mg (05/23/2019), 110 mg (06/06/2019), 110 mg (06/20/2019), 80 mg (07/25/2019), 110 mg (07/11/2019) fluorouracil (ADRUCIL) chemo injection 650 mg, 400 mg/m2 = 650 mg, Intravenous,  Once, 12 of 12 cycles Administration: 650 mg (03/01/2019), 650 mg (03/15/2019), 650 mg (03/27/2019), 650 mg (04/10/2019), 650 mg (05/09/2019), 650 mg (05/23/2019), 650 mg (06/06/2019), 650 mg (06/20/2019), 650 mg (07/25/2019), 650 mg (07/11/2019), 650 mg (08/08/2019), 650 mg (08/22/2019) fluorouracil (ADRUCIL) 3,850 mg in sodium chloride 0.9 % 73 mL chemo infusion, 2,400 mg/m2 = 3,850 mg, Intravenous, 1 Day/Dose, 12 of 12 cycles Administration: 3,850 mg (03/01/2019), 3,850 mg (03/15/2019), 3,850 mg (03/27/2019), 3,850 mg (04/10/2019), 3,850 mg (05/09/2019), 3,850 mg (05/23/2019), 3,850 mg (06/06/2019), 3,850 mg (06/20/2019), 3,850 mg (07/25/2019), 3,850 mg (07/11/2019), 3,850 mg (08/08/2019), 3,850 mg (08/22/2019)  for chemotherapy treatment.      CANCER STAGING: Cancer Staging No matching staging information was found for the patient.  INTERVAL HISTORY:  Ms. ELAJAH KUNSMAN, a 64 y.o. female, returns for routine follow-up of her stage III transverse colon adenocarcinoma. Rihanna was last seen on 03/27/2020.  Today she reports having no issues since her last visit, and she denies melena, hematochezia, or hematuria. The numbness in her fingertips is very mild, while the numbness in her feet is greatly improved. She is taking vitamin D.  Her last colonoscopy was in 12/07/2018.   REVIEW OF SYSTEMS:  Review of Systems  Constitutional: Positive for fatigue (mild). Negative for appetite change.  Gastrointestinal: Negative for blood in stool.  Genitourinary: Negative for hematuria.   Neurological: Positive  for numbness (fingertips & feet, improving).  All other systems reviewed and are negative.   PAST MEDICAL/SURGICAL  HISTORY:  Past Medical History:  Diagnosis Date  . Hyperlipidemia   . Insomnia due to anxiety and fear 01/27/2019  . Multinodular goiter   . Prediabetes    Past Surgical History:  Procedure Laterality Date  . BIOPSY  12/07/2018   Procedure: BIOPSY;  Surgeon: Rogene Houston, MD;  [facility];  Service: Endoscopy;;  sigmoid colon  . COLON RESECTION N/A 01/09/2019   Procedure: LAPAROSCOPIC RIGHT HEMICOLECTOMY;  Surgeon: Virl Cagey, MD;  Location: [facility];  Service: General;  Laterality: N/A;  . COLONOSCOPY N/A 12/07/2018   Procedure: COLONOSCOPY;  Surgeon: Rogene Houston, MD;  [facility]: [facility];  Service: Endoscopy;  Laterality: N/A;  12:45  . DILATION AND CURETTAGE OF UTERUS  2006  . FNA thyroid  2011 and 2019   benign  . POLYPECTOMY  12/07/2018   Procedure: POLYPECTOMY;  Surgeon: Rogene Houston, MD;  Location: [facility];  Service: Endoscopy;;  splenic flexure (CS x1)  . PORTACATH PLACEMENT Left 02/15/2019   Procedure: INSERTION PORT-A-CATH;  Surgeon: Virl Cagey, MD;  Location: [facility];  Service: General;  Laterality: Left;  . TUBAL LIGATION  1992    SOCIAL HISTORY:  Social History   Socioeconomic History  . Marital status: Married    Spouse name: Not on file  . Number of children: 1  . Years of education: Not on file  . Highest education level: Not on file  Occupational History  . Occupation: Theme park manager - [employer]   Tobacco Use  . Smoking status: Current Every Day Smoker    Packs/day: 0.75    Years: 45.00    Pack years: 33.75    Types: Cigarettes  . Smokeless tobacco: Never Used  Vaping Use  . Vaping Use: Never used  Substance and Sexual Activity  . Alcohol use: Yes    Alcohol/week: 0.0 standard drinks    Comment: occasionally  . Drug use: No  . Sexual activity: Yes  Other Topics Concern  . Not on file  Social History Narrative  . Not on file   Social Determinants of Health   Financial Resource Strain:   .  Difficulty of Paying Living Expenses:   Food Insecurity:   . Worried About Charity fundraiser in the Last Year:   . Arboriculturist in the Last Year:   Transportation Needs:   . Film/video editor (Medical):   Marland Kitchen Lack of Transportation (Non-Medical):   Physical Activity:   . Days of Exercise per Week:   . Minutes of Exercise per Session:   Stress:   . Feeling of Stress :   Social Connections:   . Frequency of Communication with Friends and Family:   . Frequency of Social Gatherings with Friends and Family:   . Attends Religious Services:   . Active Member of Clubs or Organizations:   . Attends Archivist Meetings:   Marland Kitchen Marital Status:   Intimate Partner Violence:   . Fear of Current or Ex-Partner:   . Emotionally Abused:   Marland Kitchen Physically Abused:   . Sexually Abused:     FAMILY HISTORY:  Family History  Problem Relation Age of Onset  . Hypertension Mother        AAA  . Coronary artery disease Mother   . Hyperlipidemia Mother   . Cancer Mother  lung  . Hypertension Father   . Cancer Brother     CURRENT MEDICATIONS:  Current Outpatient Medications  Medication Sig Dispense Refill  . acetaminophen (TYLENOL) 500 MG tablet Take 1,000 mg by mouth every 6 (six) hours as needed for moderate pain.     Marland Kitchen amLODipine (NORVASC) 2.5 MG tablet Take 1 tablet (2.5 mg total) by mouth daily. 30 tablet 3  . Calcium Carbonate-Vitamin D (CALCIUM 600+D) 600-400 MG-UNIT tablet Take 1 tablet by mouth 2 (two) times a week. Sunday and Wednesday evenings    . pravastatin (PRAVACHOL) 40 MG tablet TAKE 1 TABLET BY MOUTH IN  THE EVENING 90 tablet 3  . aspirin EC 81 MG tablet Take 1 tablet (81 mg total) by mouth every evening. (Patient not taking: Reported on 07/04/2020)     No current facility-administered medications for this visit.   Facility-Administered Medications Ordered in Other Visits  Medication Dose Route Frequency Provider Last Rate Last Admin  . sodium chloride flush  (NS) 0.9 % injection 10 mL  10 mL Intracatheter PRN Derek Jack, MD   10 mL at 04/26/19 0928    ALLERGIES:  No Known Allergies  PHYSICAL EXAM:  Performance status (ECOG): 1 - Symptomatic but completely ambulatory  Vitals:   07/04/20 1447  BP: 140/79  Pulse: 78  Resp: 18  Temp: (!) 96.9 F (36.1 C)  SpO2: 100%   Wt Readings from Last 3 Encounters:  07/04/20 141 lb 1.6 oz (64 kg)  07/03/20 141 lb (64 kg)  03/27/20 134 lb 14.4 oz (61.2 kg)   Physical Exam Vitals reviewed.  Constitutional:      Appearance: Normal appearance.  Cardiovascular:     Rate and Rhythm: Normal rate and regular rhythm.     Pulses: Normal pulses.     Heart sounds: Normal heart sounds.  Pulmonary:     Effort: Pulmonary effort is normal.     Breath sounds: Normal breath sounds.  Chest:     Comments: Port on L chest Abdominal:     Palpations: Abdomen is soft. There is no hepatomegaly, splenomegaly or mass.     Tenderness: There is no abdominal tenderness.  Musculoskeletal:     Right lower leg: No edema.     Left lower leg: No edema.  Neurological:     General: No focal deficit present.     Mental Status: She is alert and oriented to person, place, and time.  Psychiatric:        Mood and Affect: Mood normal.        Behavior: Behavior normal.      LABORATORY DATA:  I have reviewed the labs as listed.  CBC Latest Ref Rng & Units 06/26/2020 03/25/2020 12/18/2019  WBC 4.0 - 10.5 K/uL 5.4 5.2 3.7(L)  Hemoglobin 12.0 - 15.0 g/dL 12.1 12.6 12.4  Hematocrit 36 - 46 % 33.9(L) 34.9(L) 35.8(L)  Platelets 150 - 400 K/uL 191 163 179   CMP Latest Ref Rng & Units 06/26/2020 03/25/2020 12/18/2019  Glucose 70 - 99 mg/dL 132(H) 118(H) 121(H)  BUN 8 - 23 mg/dL 10 8 6(L)  Creatinine 0.44 - 1.00 mg/dL 0.69 0.67 0.63  Sodium 135 - 145 mmol/L 136 138 140  Potassium 3.5 - 5.1 mmol/L 3.2(L) 3.6 3.2(L)  Chloride 98 - 111 mmol/L 101 104 102  CO2 22 - 32 mmol/L _0 Calcium 8.9 - 10.3 mg/dL 9.4 9.7 9.8    Total Protein 6.5 - 8.1 g/dL 7.3 7.4 7.3  Total  Bilirubin 0.3 - 1.2 mg/dL 1.8(H) 1.7(H) 1.2  Alkaline Phos 38 - 126 U/L 65 75 86  AST 15 - 41 U/L 37 31 27  ALT 0 - 44 U/L _0 Lab Results  Component Value Date   VD25OH 24.96 (L) 03/25/2020   VD25OH 14.8 (L) 05/23/2019   VD25OH 14.4 (L) 08/18/2018   Lab Results  Component Value Date   CEA1 6.9 (H) 06/26/2020   CEA1 7.2 (H) 03/25/2020   CEA1 7.7 (H) 12/18/2019    DIAGNOSTIC IMAGING:  I have independently reviewed the scans and discussed with the patient. No results found.   ASSESSMENT:  1.  Stage III (PT4BPN1A) transverse colon adenocarcinoma: -Right colectomy on 01/09/2019, 1/24 lymph nodes positive, no tumor deposits, no perineural invasion, grade 2, tumor extending through serosa into adherent omentum, margins negative. -PET scan on 01/31/2019 shows focal hypermetabolic activity identified in the ileocecal anastomosis with SUV 9. -12 cycles of FOLFOX from 03/01/2019 through 08/22/2019. -CTAP on 03/26/2019 did not show any evidence of recurrence or metastatic disease.  2.  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  MSI was high by PCR.  BRAF testing was negative. -MLH1 hyper methylation was present.  Thought to be sporadic cancer.  3.  Smoking history: -40+ pack year smoker. -CT low-dose on 12/05/2019 was lung RADS 2 with emphysema.   PLAN:  1.  Stage III (PT4BPN1A) transverse colon adenocarcinoma: -Denies any change in bowel habits. -CEA 6.9.  Hemoglobin was 12.1.  LFTs are normal except elevated total bilirubin of 1.8. -I have recommended follow-up in 3 months.  We will plan to do CT of the abdomen and pelvis along with CEA and chemistries.  2.  Peripheral neuropathy: -She has mild neuropathy in the feet from prior oxaliplatin therapy with no pain.  3.  Vitamin D deficiency: -Continue vitamin D 1000 units daily.  4.  Smoking history: -Consider low-dose CT scan in December.    Orders placed this  encounter:  No orders of the defined types were placed in this encounter.    Derek Jack, MD [place] [phone]   I, Milinda Antis, am acting as a scribe for Dr. Sanda Linger.  I, Derek Jack MD, have reviewed the above documentation for accuracy and completeness, and I agree with the above.

## 2020-07-05 ENCOUNTER — Encounter: Payer: Self-pay | Admitting: Family Medicine

## 2020-07-05 DIAGNOSIS — I10 Essential (primary) hypertension: Secondary | ICD-10-CM | POA: Insufficient documentation

## 2020-07-05 NOTE — Assessment & Plan Note (Signed)
Patient educated about the importance of limiting  Carbohydrate intake , the need to commit to daily physical activity for a minimum of 30 minutes , and to commit weight loss. The fact that changes in all these areas will reduce or eliminate all together the development of diabetes is stressed.   Diabetic Labs Latest Ref Rng & Units 07/03/2020 06/26/2020 03/25/2020 12/18/2019 09/12/2019  HbA1c 4.0 - 5.6 % 5.7(A) - - - -  Microalbumin Not Estab. ug/mL - - - - -  Micro/Creat Ratio 0.0 - 30.0 mg/g creat - - - - -  Chol 0 - 200 mg/dL - - - - -  HDL >40 mg/dL - - - - -  Calc LDL 0 - 99 mg/dL - - - - -  Triglycerides <150 mg/dL - - - - -  Creatinine 0.44 - 1.00 mg/dL - 0.69 0.67 0.63 0.71   BP/Weight 07/04/2020 07/03/2020 06/26/2020 03/27/2020 03/25/2020 12/25/2019 26/71/2458  Systolic BP 099 833 825 053 976 734 193  Diastolic BP 79 84 82 72 87 82 78  Wt. (Lbs) 141.1 141 - 134.9 - 133.6 -  BMI 24.99 24.98 - 23.9 - 23.67 -   Foot/eye exam completion dates Latest Ref Rng & Units 11/08/2018 08/18/2016  Eye Exam No Retinopathy No Retinopathy -  Foot Form Completion - - Done

## 2020-07-05 NOTE — Assessment & Plan Note (Signed)
Asked:confirms currently smokes cigarettes, 10/day Assess: Unwilling to quit but cutting back Advise: needs to QUIT to reduce risk of cancer, cardio and cerebrovascular disease Assist: counseled for 5 minutes and literature provided Arrange: follow up in 3 months

## 2020-07-05 NOTE — Assessment & Plan Note (Signed)
Markedly reduced over time

## 2020-07-05 NOTE — Assessment & Plan Note (Signed)
Hyperlipidemia:Low fat diet discussed and encouraged.   Lipid Panel  Lab Results  Component Value Date   CHOL 179 05/23/2019   HDL 37 (L) 05/23/2019   LDLCALC 106 (H) 05/23/2019   TRIG 182 (H) 05/23/2019   CHOLHDL 4.8 05/23/2019   Updated lab needed at/ before next visit. Not at goal when lasr checked

## 2020-07-05 NOTE — Progress Notes (Signed)
Andrea Simon     MRN: [id number]      DOB: 1956/05/01   HPI [name] is here for follow up and re-evaluation of chronic medical conditions, medication management and review of any available recent lab and radiology data.  Preventive health is updated, specifically  Cancer screening and Immunization.   Questions or concerns regarding consultations or procedures which the PT has had in the interim are  addressed. The PT denies any adverse reactions to current medications since the last visit.  There are no new concerns.  There are no specific complaints   ROS Denies recent fever or chills. Denies sinus pressure, nasal congestion, ear pain or sore throat. Denies chest congestion, productive cough or wheezing. Denies chest pains, palpitations and leg swelling Denies abdominal pain, nausea, vomiting,diarrhea or constipation.   Denies dysuria, frequency, hesitancy or incontinence. Denies joint pain, swelling and limitation in mobility. Denies headaches, seizures, numbness, or tingling. Denies depression, anxiety or insomnia. Denies skin break down or rash.   PE  BP (!) 142/84   Pulse 88   Resp 16   Ht 5\' 3"  (1.6 m)   Wt 141 lb (64 kg)   SpO2 95%   BMI 24.98 kg/m   Patient alert and oriented and in no cardiopulmonary distress.  HEENT: No facial asymmetry, EOMI,     Neck supple .  Chest: Clear to auscultation bilaterally.  CVS: S1, S2 no murmurs, no S3.Regular rate.  ABD: Soft non tender.   Ext: No edema  MS: Adequate ROM spine, shoulders, hips and knees.  Skin: Intact, no ulcerations or rash noted.  Psych: Good eye contact, normal affect. Memory intact not anxious or depressed appearing.  CNS: CN 2-12 intact, power,  normal throughout.no focal deficits noted.   Assessment & Plan  Hyperlipemia Hyperlipidemia:Low fat diet discussed and encouraged.   Lipid Panel  Lab Results  Component Value Date   CHOL 179 05/23/2019   HDL 37 (L) 05/23/2019    LDLCALC 106 (H) 05/23/2019   TRIG 182 (H) 05/23/2019   CHOLHDL 4.8 05/23/2019   Updated lab needed at/ before next visit. Not at goal when lasr checked    Essential hypertension Start amlodipine DASH diet and commitment to daily physical activity for a minimum of 30 minutes discussed and encouraged, as a part of hypertension management. The importance of attaining a healthy weight is also discussed.  BP/Weight 07/04/2020 07/03/2020 06/26/2020 03/27/2020 03/25/2020 12/25/2019 18/29/9371  Systolic BP 696 789 381 017 510 258 527  Diastolic BP 79 84 82 72 87 82 78  Wt. (Lbs) 141.1 141 - 134.9 - 133.6 -  BMI 24.99 24.98 - 23.9 - 23.67 -       Neuropathy Markedly reduced over time  Prediabetes Patient educated about the importance of limiting  Carbohydrate intake , the need to commit to daily physical activity for a minimum of 30 minutes , and to commit weight loss. The fact that changes in all these areas will reduce or eliminate all together the development of diabetes is stressed.   Diabetic Labs Latest Ref Rng & Units 07/03/2020 06/26/2020 03/25/2020 12/18/2019 09/12/2019  HbA1c 4.0 - 5.6 % 5.7(A) - - - -  Microalbumin Not Estab. ug/mL - - - - -  Micro/Creat Ratio 0.0 - 30.0 mg/g creat - - - - -  Chol 0 - 200 mg/dL - - - - -  HDL >40 mg/dL - - - - -  Calc LDL 0 - 99 mg/dL - - - - -  Triglycerides <150 mg/dL - - - - -  Creatinine 0.44 - 1.00 mg/dL - 0.69 0.67 0.63 0.71   BP/Weight 07/04/2020 07/03/2020 06/26/2020 03/27/2020 03/25/2020 12/25/2019 31/54/0086  Systolic BP 761 950 932 671 245 809 983  Diastolic BP 79 84 82 72 87 82 78  Wt. (Lbs) 141.1 141 - 134.9 - 133.6 -  BMI 24.99 24.98 - 23.9 - 23.67 -   Foot/eye exam completion dates Latest Ref Rng & Units 11/08/2018 08/18/2016  Eye Exam No Retinopathy No Retinopathy -  Foot Form Completion - - Done      NICOTINE ADDICTION Asked:confirms currently smokes cigarettes, 10/day Assess: Unwilling to quit but cutting back Advise: needs to QUIT  to reduce risk of cancer, cardio and cerebrovascular disease Assist: counseled for 5 minutes and literature provided Arrange: follow up in 3 months

## 2020-07-05 NOTE — Assessment & Plan Note (Signed)
Start amlodipine DASH diet and commitment to daily physical activity for a minimum of 30 minutes discussed and encouraged, as a part of hypertension management. The importance of attaining a healthy weight is also discussed.  BP/Weight 07/04/2020 07/03/2020 06/26/2020 03/27/2020 03/25/2020 12/25/2019 19/12/2222  Systolic BP 114 643 142 767 011 003 496  Diastolic BP 79 84 82 72 87 82 78  Wt. (Lbs) 141.1 141 - 134.9 - 133.6 -  BMI 24.99 24.98 - 23.9 - 23.67 -

## 2020-07-08 ENCOUNTER — Encounter (INDEPENDENT_AMBULATORY_CARE_PROVIDER_SITE_OTHER): Payer: Self-pay | Admitting: *Deleted

## 2020-07-26 ENCOUNTER — Other Ambulatory Visit: Payer: Self-pay | Admitting: Family Medicine

## 2020-08-06 ENCOUNTER — Other Ambulatory Visit (INDEPENDENT_AMBULATORY_CARE_PROVIDER_SITE_OTHER): Payer: Self-pay | Admitting: *Deleted

## 2020-08-06 DIAGNOSIS — Z85038 Personal history of other malignant neoplasm of large intestine: Secondary | ICD-10-CM

## 2020-08-06 DIAGNOSIS — Z86010 Personal history of colonic polyps: Secondary | ICD-10-CM

## 2020-09-09 ENCOUNTER — Other Ambulatory Visit: Payer: Self-pay

## 2020-09-09 ENCOUNTER — Ambulatory Visit (HOSPITAL_COMMUNITY)
Admission: RE | Admit: 2020-09-09 | Discharge: 2020-09-09 | Disposition: A | Discharge status: Home / Self Care | Payer: 59 | Source: Ambulatory Visit | Attending: Family Medicine | Admitting: Family Medicine

## 2020-09-09 DIAGNOSIS — Z1231 Encounter for screening mammogram for malignant neoplasm of breast: Secondary | ICD-10-CM

## 2020-09-13 LAB — CMP14+EGFR
ALT: 23 IU/L (ref 0–32)
AST: 27 IU/L (ref 0–40)
Albumin/Globulin Ratio: 1.9 (ref 1.2–2.2)
Albumin: 4.7 g/dL (ref 3.8–4.8)
Alkaline Phosphatase: 79 IU/L (ref 44–121)
BUN/Creatinine Ratio: 11 — ABNORMAL LOW (ref 12–28)
BUN: 9 mg/dL (ref 8–27)
Bilirubin Total: 1.6 mg/dL — ABNORMAL HIGH (ref 0.0–1.2)
CO2: 26 mmol/L (ref 20–29)
Calcium: 9.7 mg/dL (ref 8.7–10.3)
Chloride: 102 mmol/L (ref 96–106)
Creatinine, Ser: 0.81 mg/dL (ref 0.57–1.00)
GFR calc Af Amer: 90 mL/min/{1.73_m2} (ref >59)
GFR calc non Af Amer: 78 mL/min/{1.73_m2} (ref >59)
Globulin, Total: 2.5 g/dL (ref 1.5–4.5)
Glucose: 113 mg/dL — ABNORMAL HIGH (ref 65–99)
Potassium: 3.9 mmol/L (ref 3.5–5.2)
Sodium: 140 mmol/L (ref 134–144)
Total Protein: 7.2 g/dL (ref 6.0–8.5)

## 2020-09-13 LAB — LIPID PANEL
Chol/HDL Ratio: 5.2 ratio — ABNORMAL HIGH (ref 0.0–4.4)
Cholesterol, Total: 187 mg/dL (ref 100–199)
HDL: 36 mg/dL — ABNORMAL LOW (ref >39)
LDL Chol Calc (NIH): 122 mg/dL — ABNORMAL HIGH (ref 0–99)
Triglycerides: 161 mg/dL — ABNORMAL HIGH (ref 0–149)
VLDL Cholesterol Cal: 29 mg/dL (ref 5–40)

## 2020-09-13 LAB — TSH: TSH: 2.02 u[IU]/mL (ref 0.450–4.500)

## 2020-09-13 LAB — VITAMIN D 25 HYDROXY (VIT D DEFICIENCY, FRACTURES): Vit D, 25-Hydroxy: 32.3 ng/mL (ref 30.0–100.0)

## 2020-09-17 ENCOUNTER — Telehealth (INDEPENDENT_AMBULATORY_CARE_PROVIDER_SITE_OTHER): Payer: Self-pay | Admitting: *Deleted

## 2020-09-17 ENCOUNTER — Encounter (INDEPENDENT_AMBULATORY_CARE_PROVIDER_SITE_OTHER): Payer: Self-pay | Admitting: *Deleted

## 2020-09-17 MED ORDER — SUTAB 1479-225-188 MG PO TABS
1.0000 | ORAL_TABLET | Freq: Once | ORAL | 0 refills | Status: AC
Start: 2020-09-17 — End: 2020-09-17

## 2020-09-17 NOTE — Telephone Encounter (Signed)
Patient needs Sutab (copay card) ° °

## 2020-09-17 NOTE — Telephone Encounter (Signed)
Referring MD/PCP: simpson   Procedure: tcs w mac  Reason/Indication:  Hx colon ca, hx polyps  Has patient had this procedure before?  Yes, 2019  If so, when, by whom and where?    Is there a family history of colon cancer?  no  Who?  What age when diagnosed?    Is patient diabetic?   no      Does patient have prosthetic heart valve or mechanical valve?  no  Do you have a pacemaker/defibrillator?  no  Has patient ever had endocarditis/atrial fibrillation? no  Does patient use oxygen? no  Has patient had joint replacement within last 12 months?  no  Is patient constipated or do they take laxatives? no  Does patient have a history of alcohol/drug use?  no  Is patient on blood thinner such as Coumadin, Plavix and/or Aspirin? yes  Medications: tylenol prn, asa 81 mg daily, amlodipine 2.5 mg daily, calcium daily, pravastatin 40 mg daily  Allergies: nkda  Medication Adjustment per Dr Rehman/Dr Jenetta Downer asa 2 days  Procedure date & time: 10/16/20

## 2020-09-19 ENCOUNTER — Other Ambulatory Visit (HOSPITAL_COMMUNITY)
Admission: RE | Admit: 2020-09-19 | Discharge: 2020-09-19 | Disposition: A | Discharge status: Home / Self Care | Payer: 59 | Source: Ambulatory Visit | Attending: Family Medicine | Admitting: Family Medicine

## 2020-09-19 ENCOUNTER — Other Ambulatory Visit: Payer: Self-pay

## 2020-09-19 ENCOUNTER — Ambulatory Visit (INDEPENDENT_AMBULATORY_CARE_PROVIDER_SITE_OTHER): Payer: 59 | Admitting: Family Medicine

## 2020-09-19 ENCOUNTER — Encounter: Payer: Self-pay | Admitting: Family Medicine

## 2020-09-19 VITALS — BP 126/77 | HR 85 | Resp 16 | Ht 63.0 in | Wt 144.0 lb

## 2020-09-19 DIAGNOSIS — F17218 Nicotine dependence, cigarettes, with other nicotine-induced disorders: Secondary | ICD-10-CM | POA: Diagnosis not present

## 2020-09-19 DIAGNOSIS — E785 Hyperlipidemia, unspecified: Secondary | ICD-10-CM

## 2020-09-19 DIAGNOSIS — T733XXA Exhaustion due to excessive exertion, initial encounter: Secondary | ICD-10-CM

## 2020-09-19 DIAGNOSIS — F321 Major depressive disorder, single episode, moderate: Secondary | ICD-10-CM

## 2020-09-19 DIAGNOSIS — Z124 Encounter for screening for malignant neoplasm of cervix: Secondary | ICD-10-CM | POA: Diagnosis present

## 2020-09-19 DIAGNOSIS — Z0001 Encounter for general adult medical examination with abnormal findings: Secondary | ICD-10-CM

## 2020-09-19 DIAGNOSIS — F17200 Nicotine dependence, unspecified, uncomplicated: Secondary | ICD-10-CM

## 2020-09-19 DIAGNOSIS — Z0000 Encounter for general adult medical examination without abnormal findings: Secondary | ICD-10-CM

## 2020-09-19 DIAGNOSIS — Z23 Encounter for immunization: Secondary | ICD-10-CM

## 2020-09-19 DIAGNOSIS — R9389 Abnormal findings on diagnostic imaging of other specified body structures: Secondary | ICD-10-CM

## 2020-09-19 DIAGNOSIS — Z122 Encounter for screening for malignant neoplasm of respiratory organs: Secondary | ICD-10-CM

## 2020-09-19 MED ORDER — BUPROPION HCL ER (XL) 150 MG PO TB24: 150.0000 mg | ORAL_TABLET | Freq: Every day | ORAL | 3 refills | Status: DC

## 2020-09-19 NOTE — Patient Instructions (Addendum)
F/u in office with MD end  December, re evaluate chronic illness, call if you need me before  Flu vaccine today  EKG In office and referral to Cardiology because of 3 week h/o exertional fatigue and increased risk factors for heart disease. Your eKG shows no signs of heart damage , but it is important that you do still get cardiology evaluation   New is wellbutrin 150 mg daily mainly to treat your depression  PLease reduce fried and fatty foods, and start taking pravstatin 40 mg EVERY day Screening  Chest scan to be scheduled for 12/16 or after  Please work on cutting back on cigarettes with a plan to quit smoking         Preventing High Cholesterol Cholesterol is a white, waxy substance similar to fat that the human body needs to help build cells. The liver makes all the cholesterol that a person's body needs. Having high cholesterol (hypercholesterolemia) increases a person's risk for heart disease and stroke. Extra (excess) cholesterol comes from the food the person eats. High cholesterol can often be prevented with diet and lifestyle changes. If you already have high cholesterol, you can control it with diet and lifestyle changes and with medicine. How can high cholesterol affect me? If you have high cholesterol, deposits (plaques) may build up on the walls of your arteries. The arteries are the blood vessels that carry blood away from your heart. Plaques make the arteries narrower and stiffer. This can limit or block blood flow and cause blood clots to form. Blood clots:  Are tiny balls of cells that form in your blood.  Can move to the heart or brain, causing a heart attack or stroke. Plaques in arteries greatly increase your risk for heart attack and stroke.Making diet and lifestyle changes can reduce your risk for these conditions that may threaten your life. What can increase my risk? This condition is more likely to develop in people who:  Eat foods that are high in  saturated fat or cholesterol. Saturated fat is mostly found in: ? Foods that contain animal fat, such as red meat and some dairy products. ? Certain fatty foods made from plants, such as tropical oils.  Are overweight.  Are not getting enough exercise.  Have a family history of high cholesterol. What actions can I take to prevent this? Nutrition   Eat less saturated fat.  Avoid trans fats (partially hydrogenated oils). These are often found in margarine and in some baked goods, fried foods, and snacks bought in packages.  Avoid precooked or cured meat, such as sausages or meat loaves.  Avoid foods and drinks that have added sugars.  Eat more fruits, vegetables, and whole grains.  Choose healthy sources of protein, such as fish, poultry, lean cuts of red meat, beans, peas, lentils, and nuts.  Choose healthy sources of fat, such as: ? Nuts. ? Vegetable oils, especially olive oil. ? Fish that have healthy fats (omega-3 fatty acids), such as mackerel or salmon. The items listed above may not be a complete list of recommended foods and beverages. Contact a dietitian for more information. Lifestyle  Lose weight if you are overweight. Losing 5-10 lb (2.3-4.5 kg) can help prevent or control high cholesterol. It can also lower your risk for diabetes and high blood pressure. Ask your health care provider to help you with a diet and exercise plan to lose weight safely.  Do not use any products that contain nicotine or tobacco, such as cigarettes, e-cigarettes, and  chewing tobacco. If you need help quitting, ask your health care provider.  Limit your alcohol intake. ? Do not drink alcohol if:  Your health care provider tells you not to drink.  You are pregnant, may be pregnant, or are planning to become pregnant. ? If you drink alcohol:  Limit how much you use to:  0-1 drink a day for women.  0-2 drinks a day for men.  Be aware of how much alcohol is in your drink. In the [place]  one drink equals one 12 oz bottle of beer (355 mL), one 5 oz glass of wine (148 mL), or one 1 oz glass of hard liquor (44 mL). Activity   Get enough exercise. Each week, do at least 150 minutes of exercise that takes a medium level of effort (moderate-intensity exercise). ? This is exercise that:  Makes your heart beat faster and makes you breathe harder than usual.  Allows you to still be able to talk. ? You could exercise in short sessions several times a day or longer sessions a few times a week. For example, on 5 days each week, you could walk fast or ride your bike 3 times a day for 10 minutes each time.  Do exercises as told by your health care provider. Medicines  In addition to diet and lifestyle changes, your health care provider may recommend medicines to help lower cholesterol. This may be a medicine to lower the amount of cholesterol your liver makes. You may need medicine if: ? Diet and lifestyle changes do not lower your cholesterol enough. ? You have high cholesterol and other risk factors for heart disease or stroke.  Take over-the-counter and prescription medicines only as told by your health care provider. General information  Manage your risk factors for high cholesterol. Talk with your health care provider about all your risk factors and how to lower your risk.  Manage other conditions that you have, such as diabetes or high blood pressure (hypertension).  Have blood tests to check your cholesterol levels at regular points in time as told by your health care provider.  Keep all follow-up visits as told by your health care provider. This is important. Where to find more information  American Heart Association: www.heart.org  National Heart, Lung, and Blood Institute: https://wilson-eaton.com/ Summary  High cholesterol increases your risk for heart disease and stroke. By keeping your cholesterol level low, you can reduce your risk for these conditions.  High  cholesterol can often be prevented with diet and lifestyle changes.  Work with your health care provider to manage your risk factors, and have your blood tested regularly. This information is not intended to replace advice given to you by your health care provider. Make sure you discuss any questions you have with your health care provider. Document Revised: 03/31/2019 Document Reviewed: 08/15/2016 Elsevier Patient Education  2020 [employer].

## 2020-09-19 NOTE — Progress Notes (Addendum)
    Andrea Simon     MRN: [id number]      DOB: 18-Oct-1956  HPI: Patient is in for annual physical exam. 3 week h/o exertional fatigue with increased CAD risk. Recent labs, if available are reviewed. Immunization is reviewed , and  updated   PE: BP 126/77   Pulse 85   Resp 16   Ht 5\' 3"  (1.6 m)   Wt 144 lb (65.3 kg)   SpO2 96%   BMI 25.51 kg/m   Pleasant  female, alert and oriented x 3, in no cardio-pulmonary distress. Afebrile. HEENT No facial trauma or asymetry. Sinuses non tender.  Extra occullar muscles intact.. External ears normal, . Neck: supple, no adenopathy,JVD or thyromegaly.No bruits.  Chest: Clear to ascultation bilaterally.No crackles or wheezes. Non tender to palpation. Decreased though adequate air entry  Breast: No asymetry,no masses or lumps. No tenderness. No nipple discharge or inversion. No axillary or supraclavicular adenopathy  Cardiovascular system; Heart sounds normal,  S1 and  S2 ,no S3.  No murmur, or thrill. Apical beat not displaced Peripheral pulses normal. EKG: NSR, no ischemia, no LVH  Abdomen: Soft, non tender, no organomegaly or masses. No bruits. Bowel sounds normal. No guarding, tenderness or rebound.   GU: External genitalia normal female genitalia , normal female distribution of hair. No lesions. Urethral meatus normal in size, no  Prolapse, no lesions visibly  Present. Bladder non tender. Vagina pink and moist , with no visible lesions , discharge present . Adequate pelvic support no  cystocele or rectocele noted Cervix pink and appears healthy, no lesions or ulcerations noted, no discharge noted from os Uterus normal size, no adnexal masses, no cervical motion or adnexal tenderness.   Musculoskeletal exam: Full ROM of spine, hips , shoulders and knees. No deformity ,swelling or crepitus noted. No muscle wasting or atrophy.   Neurologic: Cranial nerves 2 to 12 intact. Power, tone ,sensation and reflexes  normal throughout. No disturbance in gait. No tremor.  Skin: Intact, no ulceration, erythema , scaling or rash noted. Pigmentation normal throughout  Psych; depressed mood and affect. Judgement and concentration normal   Assessment & Plan:  Annual physical exam Annual exam as documented. Counseling done  re healthy lifestyle involving commitment to 150 minutes exercise per week, heart healthy diet, and attaining healthy weight.The importance of adequate sleep also discussed. Regular seat belt use and home safety, is also discussed. Changes in health habits are decided on by the patient with goals and time frames  set for achieving them. Immunization and cancer screening needs are specifically addressed at this visit.   NICOTINE ADDICTION Asked:confirms currently smokes cigarettes Assess: Unwilling to set a quit date, but is cutting back Advise: needs to QUIT to reduce risk of cancer, cardio and cerebrovascular disease Assist: counseled for 5 minutes and literature provided Arrange: follow up in 2 to 4 months   Fatigue due to excessive exertion 3 week h/o excessive exertional fatigue Increased CV risk due to family h/o CAD, nicotine use , hyperlipidemia  Office EKG NSR and no ischemia, cardiology still to evaluate  Depression, major, single episode, moderate (HCC) Score of 13 in 08/2020, not suicidal or homicidal, start wellbutrin re eval in 8 weesk. Declines therapy

## 2020-09-24 ENCOUNTER — Encounter: Payer: Self-pay | Admitting: Family Medicine

## 2020-09-24 DIAGNOSIS — F321 Major depressive disorder, single episode, moderate: Secondary | ICD-10-CM

## 2020-09-24 DIAGNOSIS — T733XXA Exhaustion due to excessive exertion, initial encounter: Secondary | ICD-10-CM | POA: Insufficient documentation

## 2020-09-24 HISTORY — DX: Major depressive disorder, single episode, moderate: F32.1

## 2020-09-24 LAB — CYTOLOGY - PAP
Comment: NEGATIVE
Diagnosis: NEGATIVE
High risk HPV: NEGATIVE

## 2020-09-24 NOTE — Assessment & Plan Note (Signed)
Asked:confirms currently smokes cigarettes °Assess: Unwilling to set a quit date, but is cutting back °Advise: needs to QUIT to reduce risk of cancer, cardio and cerebrovascular disease °Assist: counseled for 5 minutes and literature provided °Arrange: follow up in 2 to 4 months ° °

## 2020-09-24 NOTE — Assessment & Plan Note (Signed)
3 week h/o excessive exertional fatigue Increased CV risk due to family h/o CAD, nicotine use , hyperlipidemia  Office EKG NSR and no ischemia, cardiology still to evaluate

## 2020-09-24 NOTE — Assessment & Plan Note (Signed)
Score of 13 in 08/2020, not suicidal or homicidal, start wellbutrin re eval in 8 weesk. Declines therapy

## 2020-09-24 NOTE — Assessment & Plan Note (Signed)

## 2020-09-27 ENCOUNTER — Encounter (HOSPITAL_COMMUNITY): Payer: Self-pay

## 2020-10-01 ENCOUNTER — Inpatient Hospital Stay (HOSPITAL_COMMUNITY): Payer: 59 | Attending: Hematology

## 2020-10-01 ENCOUNTER — Encounter (HOSPITAL_COMMUNITY): Payer: Self-pay

## 2020-10-01 ENCOUNTER — Other Ambulatory Visit: Payer: Self-pay

## 2020-10-01 ENCOUNTER — Other Ambulatory Visit (HOSPITAL_COMMUNITY): Payer: 59

## 2020-10-01 DIAGNOSIS — R450 Nervousness: Secondary | ICD-10-CM | POA: Insufficient documentation

## 2020-10-01 DIAGNOSIS — D259 Leiomyoma of uterus, unspecified: Secondary | ICD-10-CM | POA: Diagnosis not present

## 2020-10-01 DIAGNOSIS — C184 Malignant neoplasm of transverse colon: Secondary | ICD-10-CM | POA: Diagnosis present

## 2020-10-01 DIAGNOSIS — R5383 Other fatigue: Secondary | ICD-10-CM | POA: Insufficient documentation

## 2020-10-01 DIAGNOSIS — Z79899 Other long term (current) drug therapy: Secondary | ICD-10-CM | POA: Insufficient documentation

## 2020-10-01 DIAGNOSIS — Z809 Family history of malignant neoplasm, unspecified: Secondary | ICD-10-CM | POA: Insufficient documentation

## 2020-10-01 DIAGNOSIS — F17210 Nicotine dependence, cigarettes, uncomplicated: Secondary | ICD-10-CM | POA: Diagnosis not present

## 2020-10-01 DIAGNOSIS — Z801 Family history of malignant neoplasm of trachea, bronchus and lung: Secondary | ICD-10-CM | POA: Insufficient documentation

## 2020-10-01 DIAGNOSIS — Z9221 Personal history of antineoplastic chemotherapy: Secondary | ICD-10-CM | POA: Diagnosis not present

## 2020-10-01 DIAGNOSIS — E785 Hyperlipidemia, unspecified: Secondary | ICD-10-CM | POA: Insufficient documentation

## 2020-10-01 DIAGNOSIS — G629 Polyneuropathy, unspecified: Secondary | ICD-10-CM | POA: Insufficient documentation

## 2020-10-01 DIAGNOSIS — M5136 Other intervertebral disc degeneration, lumbar region: Secondary | ICD-10-CM | POA: Diagnosis not present

## 2020-10-01 DIAGNOSIS — F32A Depression, unspecified: Secondary | ICD-10-CM | POA: Diagnosis not present

## 2020-10-01 DIAGNOSIS — E559 Vitamin D deficiency, unspecified: Secondary | ICD-10-CM | POA: Insufficient documentation

## 2020-10-01 DIAGNOSIS — K8020 Calculus of gallbladder without cholecystitis without obstruction: Secondary | ICD-10-CM | POA: Insufficient documentation

## 2020-10-01 DIAGNOSIS — Z9049 Acquired absence of other specified parts of digestive tract: Secondary | ICD-10-CM | POA: Diagnosis not present

## 2020-10-01 DIAGNOSIS — Z8249 Family history of ischemic heart disease and other diseases of the circulatory system: Secondary | ICD-10-CM | POA: Insufficient documentation

## 2020-10-01 DIAGNOSIS — M5137 Other intervertebral disc degeneration, lumbosacral region: Secondary | ICD-10-CM | POA: Diagnosis not present

## 2020-10-01 DIAGNOSIS — Z8349 Family history of other endocrine, nutritional and metabolic diseases: Secondary | ICD-10-CM | POA: Diagnosis not present

## 2020-10-01 LAB — CBC WITH DIFFERENTIAL/PLATELET
Abs Immature Granulocytes: 0.02 10*3/uL (ref 0.00–0.07)
Basophils Absolute: 0 10*3/uL (ref 0.0–0.1)
Basophils Relative: 1 %
Eosinophils Absolute: 0 10*3/uL (ref 0.0–0.5)
Eosinophils Relative: 0 %
HCT: 34.9 % — ABNORMAL LOW (ref 36.0–46.0)
Hemoglobin: 12.8 g/dL (ref 12.0–15.0)
Immature Granulocytes: 0 %
Lymphocytes Relative: 49 %
Lymphs Abs: 2.8 10*3/uL (ref 0.7–4.0)
MCH: 41.2 pg — ABNORMAL HIGH (ref 26.0–34.0)
MCHC: 36.7 g/dL — ABNORMAL HIGH (ref 30.0–36.0)
MCV: 112.2 fL — ABNORMAL HIGH (ref 80.0–100.0)
Monocytes Absolute: 0.3 10*3/uL (ref 0.1–1.0)
Monocytes Relative: 4 %
Neutro Abs: 2.6 10*3/uL (ref 1.7–7.7)
Neutrophils Relative %: 46 %
Platelets: 170 10*3/uL (ref 150–400)
RBC: 3.11 MIL/uL — ABNORMAL LOW (ref 3.87–5.11)
RDW: 13.3 % (ref 11.5–15.5)
WBC: 5.7 10*3/uL (ref 4.0–10.5)
nRBC: 0 % (ref 0.0–0.2)

## 2020-10-01 LAB — COMPREHENSIVE METABOLIC PANEL
ALT: 22 U/L (ref 0–44)
AST: 29 U/L (ref 15–41)
Albumin: 4.5 g/dL (ref 3.5–5.0)
Alkaline Phosphatase: 66 U/L (ref 38–126)
Anion gap: 10 (ref 5–15)
BUN: 6 mg/dL — ABNORMAL LOW (ref 8–23)
CO2: 26 mmol/L (ref 22–32)
Calcium: 9.5 mg/dL (ref 8.9–10.3)
Chloride: 100 mmol/L (ref 98–111)
Creatinine, Ser: 0.78 mg/dL (ref 0.44–1.00)
GFR, Estimated: >60 mL/min (ref >60)
Glucose, Bld: 160 mg/dL — ABNORMAL HIGH (ref 70–99)
Potassium: 3.2 mmol/L — ABNORMAL LOW (ref 3.5–5.1)
Sodium: 136 mmol/L (ref 135–145)
Total Bilirubin: 1.7 mg/dL — ABNORMAL HIGH (ref 0.3–1.2)
Total Protein: 7.4 g/dL (ref 6.5–8.1)

## 2020-10-01 LAB — VITAMIN D 25 HYDROXY (VIT D DEFICIENCY, FRACTURES): Vit D, 25-Hydroxy: 26.05 ng/mL — ABNORMAL LOW (ref 30–100)

## 2020-10-01 MED ORDER — SODIUM CHLORIDE 0.9% FLUSH
20.0000 mL | INTRAVENOUS | Status: DC | PRN
  Administered 2020-10-01: 20 mL via INTRAVENOUS

## 2020-10-01 MED ORDER — HEPARIN SOD (PORK) LOCK FLUSH 100 UNIT/ML IV SOLN
500.0000 [IU] | Freq: Once | INTRAVENOUS | Status: AC
  Administered 2020-10-01: 500 [IU] via INTRAVENOUS

## 2020-10-01 NOTE — Patient Instructions (Signed)
[facility] at [facility] Discharge Instructions  Labs drawn from portacath today with flush. Follow-up as scheduled   Thank you for choosing [facility] at [facility] to provide your oncology and hematology care.  To afford each patient quality time with our provider, please arrive at least 15 minutes before your scheduled appointment time.   If you have a lab appointment with the Cancer Center please come in thru the Main Entrance and check in at the main information desk.  You need to re-schedule your appointment should you arrive 10 or more minutes late.  We strive to give you quality time with our providers, and arriving late affects you and other patients whose appointments are after yours.  Also, if you no show three or more times for appointments you may be dismissed from the clinic at the providers discretion.     Again, thank you for choosing [facility].  Our hope is that these requests will decrease the amount of time that you wait before being seen by our physicians.       _____________________________________________________________  Should you have questions after your visit to [facility], please contact our office at [phone] and follow the prompts.  Our office hours are 8:00 a.m. and 4:30 p.m. Monday - Friday.  Please note that voicemails left after 4:00 p.m. may not be returned until the following business day.  We are closed weekends and major holidays.  You do have access to a nurse 24-7, just call the main number to the clinic [phone] and do not press any options, hold on the line and a nurse will answer the phone.    For prescription refill requests, have your pharmacy contact our office and allow 72 hours.    Due to Covid, you will need to wear a mask upon entering the hospital. If you do not have a mask, a mask will be given to you at the Main Entrance upon arrival. For doctor visits,  patients may have 1 support person age 18 or older with them. For treatment visits, patients can not have anyone with them due to social distancing guidelines and our immunocompromised population.     

## 2020-10-01 NOTE — Progress Notes (Signed)
Andrea Simon tolerated portacath lab draw with flush well without complaints or incident. Port accessed with 20 gauge needle with blood drawn for labs ordered then flushed easily per protocol and de-accessed. VSS Pt discharged self ambulatory in satisfactory condition

## 2020-10-02 LAB — CEA: CEA: 6.7 ng/mL — ABNORMAL HIGH (ref 0.0–4.7)

## 2020-10-03 ENCOUNTER — Ambulatory Visit (HOSPITAL_COMMUNITY)
Admission: RE | Admit: 2020-10-03 | Discharge: 2020-10-03 | Disposition: A | Discharge status: Home / Self Care | Payer: 59 | Source: Ambulatory Visit | Attending: Hematology | Admitting: Hematology

## 2020-10-03 ENCOUNTER — Other Ambulatory Visit: Payer: Self-pay

## 2020-10-03 DIAGNOSIS — C184 Malignant neoplasm of transverse colon: Secondary | ICD-10-CM | POA: Insufficient documentation

## 2020-10-03 MED ORDER — IOHEXOL 300 MG/ML  SOLN
100.0000 mL | Freq: Once | INTRAMUSCULAR | Status: AC | PRN
  Administered 2020-10-03: 100 mL via INTRAVENOUS

## 2020-10-09 ENCOUNTER — Ambulatory Visit (INDEPENDENT_AMBULATORY_CARE_PROVIDER_SITE_OTHER): Payer: 59 | Admitting: Cardiology

## 2020-10-09 ENCOUNTER — Other Ambulatory Visit (INDEPENDENT_AMBULATORY_CARE_PROVIDER_SITE_OTHER): Payer: Self-pay | Admitting: *Deleted

## 2020-10-09 VITALS — BP 120/74 | HR 76 | Ht 63.0 in | Wt 141.0 lb

## 2020-10-09 DIAGNOSIS — I2510 Atherosclerotic heart disease of native coronary artery without angina pectoris: Secondary | ICD-10-CM | POA: Diagnosis not present

## 2020-10-09 DIAGNOSIS — I10 Essential (primary) hypertension: Secondary | ICD-10-CM

## 2020-10-09 DIAGNOSIS — E785 Hyperlipidemia, unspecified: Secondary | ICD-10-CM

## 2020-10-09 DIAGNOSIS — R5383 Other fatigue: Secondary | ICD-10-CM | POA: Diagnosis not present

## 2020-10-09 MED ORDER — ROSUVASTATIN CALCIUM 10 MG PO TABS: 10.0000 mg | ORAL_TABLET | Freq: Every day | ORAL | 3 refills | Status: DC

## 2020-10-09 NOTE — Progress Notes (Signed)
Cardiology Office Note:    Date:  10/09/2020   ID:  Andrea Simon, DOB [birth year], MRN [id number]  PCP:  Fayrene Helper, MD  Cardiologist:  No primary care provider on file.  Electrophysiologist:  None   Referring MD: Fayrene Helper, MD   Chief Complaint  Patient presents with  . Fatigue    History of Present Illness:    Andrea Simon is a 64 y.o. female with a hx of colon cancer, tobacco use, depression, hypertension, hyperlipidemia who is referred by Dr. Moshe Cipro for evaluation of fatigue.  Reports that she has been having fatigue with minimal exertion.  States that she will tire out with just vacuuming.  She denies any chest pain or difficulty breathing.  Denies any lightheadedness, syncope, palpitations, or lower extremity edema.  Reports she has not been exercising.  She smokes 0.5 packs/day.  Recently started on Wellbutrin.  Family history includes mother had PCI in 47s.  She was diagnosed last year with stage III colon cancer and underwent right colectomy in January 2020, completed 12 cycles of FOLFOX chemotherapy in September 2020.  Coronary calcifications noted on CT chest 12/05/2019.   Past Medical History:  Diagnosis Date  . Cancer ([place])    Phreesia 09/16/2020  . Hyperlipidemia   . Insomnia due to anxiety and fear 01/27/2019  . Multinodular goiter   . Prediabetes     Past Surgical History:  Procedure Laterality Date  . BIOPSY  12/07/2018   Procedure: BIOPSY;  Surgeon: Rogene Houston, MD;  Location: [facility];  Service: Endoscopy;;  sigmoid colon  . COLON RESECTION N/A 01/09/2019   Procedure: LAPAROSCOPIC RIGHT HEMICOLECTOMY;  Surgeon: Virl Cagey, MD;  Location: [facility] ORS;  Service: General;  Laterality: N/A;  . COLON SURGERY N/A    Phreesia 09/16/2020  . COLONOSCOPY N/A 12/07/2018   Procedure: COLONOSCOPY;  Surgeon: Rogene Houston, MD;  Location: [facility];  Service: Endoscopy;  Laterality: N/A;  12:45  . DILATION AND  CURETTAGE OF UTERUS  2006  . FNA thyroid  2011 and 2019   benign  . POLYPECTOMY  12/07/2018   Procedure: POLYPECTOMY;  Surgeon: Rogene Houston, MD;  Location: [facility];  Service: Endoscopy;;  splenic flexure (CS x1)  . PORTACATH PLACEMENT Left 02/15/2019   Procedure: INSERTION PORT-A-CATH;  Surgeon: Virl Cagey, MD;  Location: [facility] ORS;  Service: General;  Laterality: Left;  . TUBAL LIGATION  1992    Current Medications: No outpatient medications have been marked as taking for the 10/09/20 encounter (Office Visit) with Donato Heinz, MD.     Allergies:   Patient has no known allergies.   Social History   Socioeconomic History  . Marital status: Married    Spouse name: Not on file  . Number of children: 1  . Years of education: Not on file  . Highest education level: Not on file  Occupational History  . Occupation: [employer] manager - [employer]   Tobacco Use  . Smoking status: Current Every Day Smoker    Packs/day: 0.75    Years: 45.00    Pack years: 33.75    Types: Cigarettes  . Smokeless tobacco: Never Used  Vaping Use  . Vaping Use: Never used  Substance and Sexual Activity  . Alcohol use: Yes    Alcohol/week: 0.0 standard drinks    Comment: occasionally  . Drug use: No  . Sexual activity: Yes  Other Topics Concern  . Not on file  Social History Narrative  . Not on file   Social Determinants of Health   Financial Resource Strain:   . Difficulty of Paying Living Expenses: Not on file  Food Insecurity:   . Worried About Charity fundraiser in the Last Year: Not on file  . Ran Out of Food in the Last Year: Not on file  Transportation Needs:   . Lack of Transportation (Medical): Not on file  . Lack of Transportation (Non-Medical): Not on file  Physical Activity:   . Days of Exercise per Week: Not on file  . Minutes of Exercise per Session: Not on file  Stress:   . Feeling of Stress : Not on file  Social Connections:   . Frequency of  Communication with Friends and Family: Not on file  . Frequency of Social Gatherings with Friends and Family: Not on file  . Attends Religious Services: Not on file  . Active Member of Clubs or Organizations: Not on file  . Attends Archivist Meetings: Not on file  . Marital Status: Not on file     Family History: [name] family history includes Cancer in her brother and mother; Coronary artery disease in her mother; Hyperlipidemia in her mother; Hypertension in her father and mother.  ROS:   Please see the history of present illness.     All other systems reviewed and are negative.  EKGs/Labs/Other Studies Reviewed:    The following studies were reviewed today:  EKG:  EKG is ordered today.  The ekg ordered today demonstrates normal sinus rhythm, rate 76, nonspecific T wave flattening  Recent Labs: 09/12/2020: TSH 2.020 10/01/2020: ALT 22; BUN 6; Creatinine, Ser 0.78; Hemoglobin 12.8; Platelets 170; Potassium 3.2; Sodium 136  Recent Lipid Panel    Component Value Date/Time   CHOL 187 09/12/2020 1025   TRIG 161 (H) 09/12/2020 1025   HDL 36 (L) 09/12/2020 1025   CHOLHDL 5.2 (H) 09/12/2020 1025   CHOLHDL 4.8 05/23/2019 0758   VLDL 36 05/23/2019 0758   LDLCALC 122 (H) 09/12/2020 1025    Physical Exam:    VS:  BP 120/74   Pulse 76   Ht 5\' 3"  (1.6 m)   Wt 141 lb (64 kg)   SpO2 97%   BMI 24.98 kg/m     Wt Readings from Last 3 Encounters:  10/09/20 141 lb (64 kg)  09/19/20 144 lb (65.3 kg)  07/04/20 141 lb 1.6 oz (64 kg)     GEN:  Well nourished, well developed in no acute distress HEENT: Normal NECK: No JVD; No carotid bruits LYMPHATICS: No lymphadenopathy CARDIAC: RRR, no murmurs, rubs, gallops RESPIRATORY:  Clear to auscultation without rales, wheezing or rhonchi  ABDOMEN: Soft, non-tender, non-distended MUSCULOSKELETAL:  No edema; No deformity  SKIN: Warm and dry NEUROLOGIC:  Alert and oriented x 3 PSYCHIATRIC:  Normal affect   ASSESSMENT:      1. CAD in native artery   2. Fatigue, unspecified type   3. Essential hypertension   4. Hyperlipidemia, unspecified hyperlipidemia type    PLAN:    Fatigue: Reports fatigue/weakness with minimal exertion, and CT chest last year shows LAD calcifications.  Will evaluate for ischemia with Lexiscan Myoview.  Will check echocardiogram to evaluate for structural heart disease  Hypertension: On amlodipine 2.5 mg daily.  Appears controlled  Hyperlipidemia: On pravastatin 40 mg daily, LDL 122 on 09/12/2020.  Given known coronary calcifications, recommend switching to high intensity statin.  Will start rosuvastatin 10 mg daily.  RTC in 3 months   Medication Adjustments/Labs and Tests Ordered: Current medicines are reviewed at length with the patient today.  Concerns regarding medicines are outlined above.  Orders Placed This Encounter  Procedures  . MYOCARDIAL PERFUSION IMAGING  . EKG 12-Lead  . ECHOCARDIOGRAM COMPLETE   Meds ordered this encounter  Medications  . rosuvastatin (CRESTOR) 10 MG tablet    Sig: Take 1 tablet (10 mg total) by mouth daily.    Dispense:  90 tablet    Refill:  3    Stop pravastatin    Patient Instructions  Medication Instructions:  STOP pravastatin START rosuvastatin (Crestor) 10 mg daily  *If you need a refill on your cardiac medications before your next appointment, please call your pharmacy*  Testing/Procedures: Your physician has requested that you have an echocardiogram. Echocardiography is a painless test that uses sound waves to create images of your heart. It provides your doctor with information about the size and shape of your heart and how well your heart's chambers and valves are working. This procedure takes approximately one hour. There are no restrictions for this procedure.  Your physician has requested that you have a lexiscan myoview. For further information please visit [email or web address]. Please follow instruction sheet, as  given.  Follow-Up: At [facility], you and your health needs are our priority.  As part of our continuing mission to provide you with exceptional heart care, we have created designated Provider Care Teams.  These Care Teams include your primary Cardiologist (physician) and Advanced Practice Providers (APPs -  Physician Assistants and Nurse Practitioners) who all work together to provide you with the care you need, when you need it.  We recommend signing up for the patient portal called "MyChart".  Sign up information is provided on this After Visit Summary.  MyChart is used to connect with patients for Virtual Visits (Telemedicine).  Patients are able to view lab/test results, encounter notes, upcoming appointments, etc.  Non-urgent messages can be sent to your provider as well.   To learn more about what you can do with MyChart, go to [email or web address].    Your next appointment:   3 month(s)  The format for your next appointment:   In Person  Provider:   Oswaldo Milian, MD       Signed, Donato Heinz, MD  10/09/2020 5:44 PM    [place]

## 2020-10-09 NOTE — Patient Instructions (Signed)
Medication Instructions:  STOP pravastatin START rosuvastatin (Crestor) 10 mg daily  *If you need a refill on your cardiac medications before your next appointment, please call your pharmacy*  Testing/Procedures: Your physician has requested that you have an echocardiogram. Echocardiography is a painless test that uses sound waves to create images of your heart. It provides your doctor with information about the size and shape of your heart and how well your heart's chambers and valves are working. This procedure takes approximately one hour. There are no restrictions for this procedure.  Your physician has requested that you have a lexiscan myoview. For further information please visit [email or web address]. Please follow instruction sheet, as given.  Follow-Up: At [facility], you and your health needs are our priority.  As part of our continuing mission to provide you with exceptional heart care, we have created designated Provider Care Teams.  These Care Teams include your primary Cardiologist (physician) and Advanced Practice Providers (APPs -  Physician Assistants and Nurse Practitioners) who all work together to provide you with the care you need, when you need it.  We recommend signing up for the patient portal called "MyChart".  Sign up information is provided on this After Visit Summary.  MyChart is used to connect with patients for Virtual Visits (Telemedicine).  Patients are able to view lab/test results, encounter notes, upcoming appointments, etc.  Non-urgent messages can be sent to your provider as well.   To learn more about what you can do with MyChart, go to [email or web address].    Your next appointment:   3 month(s)  The format for your next appointment:   In Person  Provider:   Oswaldo Milian, MD

## 2020-10-10 ENCOUNTER — Other Ambulatory Visit: Payer: Self-pay

## 2020-10-10 ENCOUNTER — Inpatient Hospital Stay (HOSPITAL_BASED_OUTPATIENT_CLINIC_OR_DEPARTMENT_OTHER): Payer: 59 | Admitting: Hematology

## 2020-10-10 VITALS — BP 116/80 | HR 78 | Temp 97.3°F | Resp 17 | Wt 138.6 lb

## 2020-10-10 DIAGNOSIS — C184 Malignant neoplasm of transverse colon: Secondary | ICD-10-CM

## 2020-10-10 NOTE — Patient Instructions (Signed)
[place] at [facility] Discharge Instructions  You were seen today by Dr. Delton Coombes. He went over your recent results. Keep your appointment to get the CT scan of chest in December. Dr. Delton Coombes will see you back in 3 months for labs and follow up.   Thank you for choosing [place] at [facility] to provide your oncology and hematology care.  To afford each patient quality time with our provider, please arrive at least 15 minutes before your scheduled appointment time.   If you have a lab appointment with the [facility] please come in thru the Main Entrance and check in at the main information desk  You need to re-schedule your appointment should you arrive 10 or more minutes late.  We strive to give you quality time with our providers, and arriving late affects you and other patients whose appointments are after yours.  Also, if you no show three or more times for appointments you may be dismissed from the clinic at the providers discretion.     Again, thank you for choosing [facility].  Our hope is that these requests will decrease the amount of time that you wait before being seen by our physicians.       _____________________________________________________________  Should you have questions after your visit to [facility], please contact our office at [phone] between the hours of 8:00 a.m. and 4:30 p.m.  Voicemails left after 4:00 p.m. will not be returned until the following business day.  For prescription refill requests, have your pharmacy contact our office and allow 72 hours.    Cancer Center Support Programs:   > Cancer Support Group  2nd Tuesday of the month 1pm-2pm, Journey Room

## 2020-10-10 NOTE — Patient Instructions (Signed)
Andrea Simon  10/10/2020     @PREFPERIOPPHARMACY @   Your procedure is scheduled on  10/16/2020  Report to [facility] at  0800 A.M.  Call this number if you have problems the morning of surgery:  [phone]   Remember:  Follow the diet and prep instructions given to you by the office.                    Take these medicines the morning of surgery with A SIP OF WATER  Amlodipine, wellbutrin.    Do not wear jewelry, make-up or nail polish.  Do not wear lotions, powders, or perfumes. Please wear deodorant and brush your teeth.  Do not shave 48 hours prior to surgery.  Men may shave face and neck.  Do not bring valuables to the hospital.  [facility] is not responsible for any belongings or valuables.  Contacts, dentures or bridgework may not be worn into surgery.  Leave your suitcase in the car.  After surgery it may be brought to your room.  For patients admitted to the hospital, discharge time will be determined by your treatment team.  Patients discharged the day of surgery will not be allowed to drive home.   Name and phone number of your driver:   family Special instructions:  DO NOT smoke the morning of your procedure.  Please read over the following fact sheets that you were given. Anesthesia Post-op Instructions and Care and Recovery After Surgery       Colonoscopy, Adult, Care After This sheet gives you information about how to care for yourself after your procedure. Your health care provider may also give you more specific instructions. If you have problems or questions, contact your health care provider. What can I expect after the procedure? After the procedure, it is common to have:  A small amount of blood in your stool for 24 hours after the procedure.  Some gas.  Mild cramping or bloating of your abdomen. Follow these instructions at home: Eating and drinking   Drink enough fluid to keep your urine pale yellow.  Follow instructions  from your health care provider about eating or drinking restrictions.  Resume your normal diet as instructed by your health care provider. Avoid heavy or fried foods that are hard to digest. Activity  Rest as told by your health care provider.  Avoid sitting for a long time without moving. Get up to take short walks every 1-2 hours. This is important to improve blood flow and breathing. Ask for help if you feel weak or unsteady.  Return to your normal activities as told by your health care provider. Ask your health care provider what activities are safe for you. Managing cramping and bloating   Try walking around when you have cramps or feel bloated.  Apply heat to your abdomen as told by your health care provider. Use the heat source that your health care provider recommends, such as a moist heat pack or a heating pad. ? Place a towel between your skin and the heat source. ? Leave the heat on for 20-30 minutes. ? Remove the heat if your skin turns bright red. This is especially important if you are unable to feel pain, heat, or cold. You may have a greater risk of getting burned. General instructions  For the first 24 hours after the procedure: ? Do not drive or use machinery. ? Do not sign important documents. ? Do  not drink alcohol. ? Do your regular daily activities at a slower pace than normal. ? Eat soft foods that are easy to digest.  Take over-the-counter and prescription medicines only as told by your health care provider.  Keep all follow-up visits as told by your health care provider. This is important. Contact a health care provider if:  You have blood in your stool 2-3 days after the procedure. Get help right away if you have:  More than a small spotting of blood in your stool.  Large blood clots in your stool.  Swelling of your abdomen.  Nausea or vomiting.  A fever.  Increasing pain in your abdomen that is not relieved with medicine. Summary  After the  procedure, it is common to have a small amount of blood in your stool. You may also have mild cramping and bloating of your abdomen.  For the first 24 hours after the procedure, do not drive or use machinery, sign important documents, or drink alcohol.  Get help right away if you have a lot of blood in your stool, nausea or vomiting, a fever, or increased pain in your abdomen. This information is not intended to replace advice given to you by your health care provider. Make sure you discuss any questions you have with your health care provider. Document Revised: 07/03/2019 Document Reviewed: 07/03/2019 Elsevier Patient Education  [place] After These instructions provide you with information about caring for yourself after your procedure. Your health care provider may also give you more specific instructions. Your treatment has been planned according to current medical practices, but problems sometimes occur. Call your health care provider if you have any problems or questions after your procedure. What can I expect after the procedure? After your procedure, you may:  Feel sleepy for several hours.  Feel clumsy and have poor balance for several hours.  Feel forgetful about what happened after the procedure.  Have poor judgment for several hours.  Feel nauseous or vomit.  Have a sore throat if you had a breathing tube during the procedure. Follow these instructions at home: For at least 24 hours after the procedure:      Have a responsible adult stay with you. It is important to have someone help care for you until you are awake and alert.  Rest as needed.  Do not: ? Participate in activities in which you could fall or become injured. ? Drive. ? Use heavy machinery. ? Drink alcohol. ? Take sleeping pills or medicines that cause drowsiness. ? Make important decisions or sign legal documents. ? Take care of children on your own. Eating  and drinking  Follow the diet that is recommended by your health care provider.  If you vomit, drink water, juice, or soup when you can drink without vomiting.  Make sure you have little or no nausea before eating solid foods. General instructions  Take over-the-counter and prescription medicines only as told by your health care provider.  If you have sleep apnea, surgery and certain medicines can increase your risk for breathing problems. Follow instructions from your health care provider about wearing your sleep device: ? Anytime you are sleeping, including during daytime naps. ? While taking prescription pain medicines, sleeping medicines, or medicines that make you drowsy.  If you smoke, do not smoke without supervision.  Keep all follow-up visits as told by your health care provider. This is important. Contact a health care provider if:  You keep feeling  nauseous or you keep vomiting.  You feel light-headed.  You develop a rash.  You have a fever. Get help right away if:  You have trouble breathing. Summary  For several hours after your procedure, you may feel sleepy and have poor judgment.  Have a responsible adult stay with you for at least 24 hours or until you are awake and alert. This information is not intended to replace advice given to you by your health care provider. Make sure you discuss any questions you have with your health care provider. Document Revised: 03/07/2018 Document Reviewed: 03/29/2016 Elsevier Patient Education  [place].

## 2020-10-10 NOTE — Progress Notes (Signed)
[place], [place] [address]   CLINIC:  Medical Oncology/Hematology  PCP:  Fayrene Helper, MD [address], [address] [place] / [place] [address] [phone]   REASON FOR VISIT:  Follow-up for stage III transverse colon adenocarcinoma  PRIOR THERAPY:  1. Laparoscopic right colectomy on 01/09/2019. 2. FOLFOX x 12 cycles from 03/01/2019 to 08/22/2019.  NGS Results: Not done  CURRENT THERAPY: Observation  BRIEF ONCOLOGIC HISTORY:  Oncology History  Colon cancer ([place])  01/09/2019 Initial Diagnosis   Colon cancer ([place])   03/01/2019 -  Chemotherapy   The patient had palonosetron (ALOXI) injection 0.25 mg, 0.25 mg, Intravenous,  Once, 12 of 12 cycles Administration: 0.25 mg (03/01/2019), 0.25 mg (03/15/2019), 0.25 mg (03/27/2019), 0.25 mg (04/10/2019), 0.25 mg (05/09/2019), 0.25 mg (05/23/2019), 0.25 mg (06/06/2019), 0.25 mg (06/20/2019), 0.25 mg (07/25/2019), 0.25 mg (07/11/2019), 0.25 mg (08/08/2019), 0.25 mg (08/22/2019) pegfilgrastim (NEULASTA) injection 6 mg, 6 mg, Subcutaneous, Once, 8 of 8 cycles Administration: 6 mg (05/11/2019), 6 mg (05/25/2019), 6 mg (06/08/2019), 6 mg (06/22/2019), 6 mg (07/13/2019), 6 mg (07/27/2019), 6 mg (08/10/2019), 6 mg (08/24/2019) leucovorin 600 mg in dextrose 5 % 250 mL infusion, 640 mg, Intravenous,  Once, 12 of 12 cycles Administration: 600 mg (03/01/2019), 600 mg (03/15/2019), 640 mg (03/27/2019), 640 mg (04/10/2019), 640 mg (05/09/2019), 640 mg (05/23/2019), 640 mg (06/06/2019), 640 mg (06/20/2019), 640 mg (07/25/2019), 640 mg (07/11/2019), 640 mg (08/08/2019), 640 mg (08/22/2019) oxaliplatin (ELOXATIN) 135 mg in dextrose 5 % 500 mL chemo infusion, 85 mg/m2 = 135 mg, Intravenous,  Once, 10 of 10 cycles Dose modification: 68 mg/m2 (80 % of original dose 85 mg/m2, Cycle 5, Reason: Provider Judgment), 68 mg/m2 (original dose 85 mg/m2, Cycle 6, Reason: Provider Judgment), 51 mg/m2 (60 % of original dose 85 mg/m2, Cycle 10, Reason: Other (see comments),  Comment: neuropathy) Administration: 135 mg (03/01/2019), 135 mg (03/15/2019), 135 mg (03/27/2019), 135 mg (04/10/2019), 110 mg (05/09/2019), 110 mg (05/23/2019), 110 mg (06/06/2019), 110 mg (06/20/2019), 80 mg (07/25/2019), 110 mg (07/11/2019) fluorouracil (ADRUCIL) chemo injection 650 mg, 400 mg/m2 = 650 mg, Intravenous,  Once, 12 of 12 cycles Administration: 650 mg (03/01/2019), 650 mg (03/15/2019), 650 mg (03/27/2019), 650 mg (04/10/2019), 650 mg (05/09/2019), 650 mg (05/23/2019), 650 mg (06/06/2019), 650 mg (06/20/2019), 650 mg (07/25/2019), 650 mg (07/11/2019), 650 mg (08/08/2019), 650 mg (08/22/2019) fluorouracil (ADRUCIL) 3,850 mg in sodium chloride 0.9 % 73 mL chemo infusion, 2,400 mg/m2 = 3,850 mg, Intravenous, 1 Day/Dose, 12 of 12 cycles Administration: 3,850 mg (03/01/2019), 3,850 mg (03/15/2019), 3,850 mg (03/27/2019), 3,850 mg (04/10/2019), 3,850 mg (05/09/2019), 3,850 mg (05/23/2019), 3,850 mg (06/06/2019), 3,850 mg (06/20/2019), 3,850 mg (07/25/2019), 3,850 mg (07/11/2019), 3,850 mg (08/08/2019), 3,850 mg (08/22/2019)  for chemotherapy treatment.      CANCER STAGING: Cancer Staging No matching staging information was found for the patient.  INTERVAL HISTORY:  Ms. EVALEIGH MCCAMY, a 64 y.o. female, returns for routine follow-up of her stage III transverse colon adenocarcinoma. Foye was last seen on 07/04/2020.   Today she reports feeling well. She denies changes in her BM's and denies hematochezia or hematuria. Her numbness in her feet is stable and it does bother her while she sleeps.  She will get a CT chest scan in December from Dr. Moshe Cipro. She already got her flu shot but has not received her COVID booster shot yet.   REVIEW OF SYSTEMS:  Review of Systems  Constitutional: Positive for fatigue (75%). Negative for appetite  change.  Neurological: Positive for numbness (feet).  Psychiatric/Behavioral: Positive for depression (occasional). The patient is nervous/anxious (occasional).   All other systems reviewed  and are negative.   PAST MEDICAL/SURGICAL HISTORY:  Past Medical History:  Diagnosis Date  . Cancer ([place])    Phreesia 09/16/2020  . Hyperlipidemia   . Insomnia due to anxiety and fear 01/27/2019  . Multinodular goiter   . Prediabetes    Past Surgical History:  Procedure Laterality Date  . BIOPSY  12/07/2018   Procedure: BIOPSY;  Surgeon: Rogene Houston, MD;  Location: [facility];  Service: Endoscopy;;  sigmoid colon  . COLON RESECTION N/A 01/09/2019   Procedure: LAPAROSCOPIC RIGHT HEMICOLECTOMY;  Surgeon: Virl Cagey, MD;  Location: [facility] ORS;  Service: General;  Laterality: N/A;  . COLON SURGERY N/A    Phreesia 09/16/2020  . COLONOSCOPY N/A 12/07/2018   Procedure: COLONOSCOPY;  Surgeon: Rogene Houston, MD;  Location: [facility] END[facility];  Service: Endoscopy;  Laterality: N/A;  12:45  . DILATION AND CURETTAGE OF UTERUS  2006  . FNA thyroid  2011 and 2019   benign  . POLYPECTOMY  12/07/2018   Procedure: POLYPECTOMY;  Surgeon: Rogene Houston, MD;  Location: [facility];  Service: Endoscopy;;  splenic flexure (CS x1)  . PORTACATH PLACEMENT Left 02/15/2019   Procedure: INSERTION PORT-A-CATH;  Surgeon: Virl Cagey, MD;  Location: [facility] ORS;  Service: General;  Laterality: Left;  . TUBAL LIGATION  1992    SOCIAL HISTORY:  Social History   Socioeconomic History  . Marital status: Married    Spouse name: Not on file  . Number of children: 1  . Years of education: Not on file  . Highest education level: Not on file  Occupational History  . Occupation: Theme park manager - [employer]   Tobacco Use  . Smoking status: Current Every Day Smoker    Packs/day: 0.75    Years: 45.00    Pack years: 33.75    Types: Cigarettes  . Smokeless tobacco: Never Used  Vaping Use  . Vaping Use: Never used  Substance and Sexual Activity  . Alcohol use: Yes    Alcohol/week: 0.0 standard drinks    Comment: occasionally  . Drug use: No  . Sexual activity: Yes  Other Topics Concern  .  Not on file  Social History Narrative  . Not on file   Social Determinants of Health   Financial Resource Strain:   . Difficulty of Paying Living Expenses: Not on file  Food Insecurity:   . Worried About Charity fundraiser in the Last Year: Not on file  . Ran Out of Food in the Last Year: Not on file  Transportation Needs:   . Lack of Transportation (Medical): Not on file  . Lack of Transportation (Non-Medical): Not on file  Physical Activity:   . Days of Exercise per Week: Not on file  . Minutes of Exercise per Session: Not on file  Stress:   . Feeling of Stress : Not on file  Social Connections:   . Frequency of Communication with Friends and Family: Not on file  . Frequency of Social Gatherings with Friends and Family: Not on file  . Attends Religious Services: Not on file  . Active Member of Clubs or Organizations: Not on file  . Attends Archivist Meetings: Not on file  . Marital Status: Not on file  Intimate Partner Violence:   . Fear of Current or Ex-Partner: Not on  file  . Emotionally Abused: Not on file  . Physically Abused: Not on file  . Sexually Abused: Not on file    FAMILY HISTORY:  Family History  Problem Relation Age of Onset  . Hypertension Mother        AAA  . Coronary artery disease Mother   . Hyperlipidemia Mother   . Cancer Mother        lung  . Hypertension Father   . Cancer Brother     CURRENT MEDICATIONS:  Current Outpatient Medications  Medication Sig Dispense Refill  . acetaminophen (TYLENOL) 500 MG tablet Take 1,000 mg by mouth every 6 (six) hours as needed for moderate pain.     Marland Kitchen amLODipine (NORVASC) 2.5 MG tablet Take 1 tablet (2.5 mg total) by mouth daily. 30 tablet 3  . aspirin EC 81 MG tablet Take 81 mg by mouth every evening.     Marland Kitchen buPROPion (WELLBUTRIN XL) 150 MG 24 hr tablet Take 1 tablet (150 mg total) by mouth daily. 30 tablet 3  . Calcium Carbonate-Vitamin D (CALCIUM 600+D) 600-400 MG-UNIT tablet Take 1 tablet by  mouth daily.     Marland Kitchen lidocaine-prilocaine (EMLA) cream Apply 1 application topically daily as needed (prior to port flush).    . rosuvastatin (CRESTOR) 10 MG tablet Take 1 tablet (10 mg total) by mouth daily. 90 tablet 3  . SUTAB [phone] MG TABS TAKE 1 KIT BY MOUTH ONCE FOR 1 DOSE     No current facility-administered medications for this visit.   Facility-Administered Medications Ordered in Other Visits  Medication Dose Route Frequency Provider Last Rate Last Admin  . sodium chloride flush (NS) 0.9 % injection 10 mL  10 mL Intracatheter PRN Derek Jack, MD   10 mL at 04/26/19 0928    ALLERGIES:  No Known Allergies  PHYSICAL EXAM:  Performance status (ECOG): 1 - Symptomatic but completely ambulatory  Vitals:   10/10/20 1507  BP: 116/80  Pulse: 78  Resp: 17  Temp: (!) 97.3 F (36.3 C)  SpO2: 100%   Wt Readings from Last 3 Encounters:  10/10/20 138 lb 9.6 oz (62.9 kg)  10/09/20 141 lb (64 kg)  09/19/20 144 lb (65.3 kg)   Physical Exam Vitals reviewed.  Constitutional:      Appearance: Normal appearance.  Cardiovascular:     Rate and Rhythm: Normal rate and regular rhythm.     Pulses: Normal pulses.     Heart sounds: Normal heart sounds.  Pulmonary:     Effort: Pulmonary effort is normal.     Breath sounds: Normal breath sounds.  Abdominal:     Palpations: Abdomen is soft. There is no hepatomegaly, splenomegaly or mass.     Tenderness: There is no abdominal tenderness.     Hernia: No hernia is present.  Neurological:     General: No focal deficit present.     Mental Status: She is alert and oriented to person, place, and time.  Psychiatric:        Mood and Affect: Mood normal.        Behavior: Behavior normal.      LABORATORY DATA:  I have reviewed the labs as listed.  CBC Latest Ref Rng & Units 10/01/2020 06/26/2020 03/25/2020  WBC 4.0 - 10.5 K/uL 5.7 5.4 5.2  Hemoglobin 12.0 - 15.0 g/dL 12.8 12.1 12.6  Hematocrit 36 - 46 % 34.9(L) 33.9(L) 34.9(L)    Platelets 150 - 400 K/uL 170 191 163   CMP Latest Ref  Rng & Units 10/01/2020 09/12/2020 06/26/2020  Glucose 70 - 99 mg/dL 160(H) 113(H) 132(H)  BUN 8 - 23 mg/dL 6(L) 9 10  Creatinine 0.44 - 1.00 mg/dL 0.78 0.81 0.69  Sodium 135 - 145 mmol/L 136 140 136  Potassium 3.5 - 5.1 mmol/L 3.2(L) 3.9 3.2(L)  Chloride 98 - 111 mmol/L 100 102 101  CO2 22 - 32 mmol/L '26 26 25  ' Calcium 8.9 - 10.3 mg/dL 9.5 9.7 9.4  Total Protein 6.5 - 8.1 g/dL 7.4 7.2 7.3  Total Bilirubin 0.3 - 1.2 mg/dL 1.7(H) 1.6(H) 1.8(H)  Alkaline Phos 38 - 126 U/L 66 79 65  AST 15 - 41 U/L 29 27 37  ALT 0 - 44 U/L '22 23 28   ' Lab Results  Component Value Date   VD25OH 26.05 (L) 10/01/2020   VD25OH 32.3 09/12/2020   VD25OH 24.96 (L) 03/25/2020   Lab Results  Component Value Date   CEA1 6.7 (H) 10/01/2020   CEA1 6.9 (H) 06/26/2020   CEA1 7.2 (H) 03/25/2020    DIAGNOSTIC IMAGING:  I have independently reviewed the scans and discussed with the patient. CT Abdomen Pelvis W Contrast  Result Date: 10/03/2020 CLINICAL DATA:  Follow-up of stage III transverse colon adenocarcinoma. Asymptomatic. Status post right colectomy 01/09/2019. Status post chemotherapy finishing in September of 2020. EXAM: CT ABDOMEN AND PELVIS WITH CONTRAST TECHNIQUE: Multidetector CT imaging of the abdomen and pelvis was performed using the standard protocol following bolus administration of intravenous contrast. CONTRAST:  129m OMNIPAQUE IOHEXOL 300 MG/ML  SOLN COMPARISON:  03/25/2020 FINDINGS: Lower chest: Clear lung bases. Normal heart size without pericardial or pleural effusion. Hepatobiliary: Normal liver. 8 mm gallstone, without acute cholecystitis or biliary duct dilatation. Pancreas: Normal, without mass or ductal dilatation. Spleen: Normal in size, without focal abnormality. Adrenals/Urinary Tract: Normal adrenal glands. Normal kidneys, without hydronephrosis. Normal urinary bladder. Stomach/Bowel: Normal stomach, without wall thickening. Colonic  stool burden suggests constipation. Partial right hemicolectomy. Normal small bowel. Vascular/Lymphatic: Advanced aortic and branch vessel atherosclerosis. No abdominopelvic adenopathy. Reproductive: Fibroid uterus. Dominant left-sided body/fundal lesion of maximally 6.0 cm. No gross adnexal mass with limitations secondary to uterine enlargement. Other: No significant free fluid. No evidence of omental or peritoneal disease. Mild fat containing ventral abdominal wall laxity including on 34/3. Musculoskeletal: No acute osseous abnormality. Degenerative disc disease at L5-S1 with disc bulges at this level and L4-5. IMPRESSION: 1. Status post right hemicolectomy, without recurrent or metastatic disease. 2. Fibroid uterus. 3. Cholelithiasis. 4. Aortic Atherosclerosis (ICD10-I70.0). Electronically Signed   By: KAbigail MiyamotoM.D.   On: 10/03/2020 15:51     ASSESSMENT:  1. Stage III (PT4BPN1A) transverse colon adenocarcinoma: -Right colectomy on 01/09/2019, 1/24 lymph nodes positive, no tumor deposits, no perineural invasion, grade 2, tumor extending through serosa into adherent omentum, margins negative. -PET scan on 01/31/2019 shows focal hypermetabolic activity identified in the ileocecal anastomosis with SUV 9. -12 cycles of FOLFOX from 03/01/2019 through 08/22/2019. -CTAP on 03/26/2019 did not show any evidence of recurrence or metastatic disease. -CTAP from 10/03/2020 shows no evidence of recurrence or metastatic disease.  2. 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  MSI was high by PCR.  BRAF testing was negative. -MLH1 hyper methylation was present.  Thought to be sporadic cancer.  3. Smoking history: -40+ pack year smoker. -CT low-dose on 12/05/2019 was lung RADS 2 with emphysema.   PLAN:  1. Stage III (PT4BPN1A) transverse colon adenocarcinoma: -Denies any change in bowel habits or bleeding per  rectum. -Reviewed CT scan from 10/03/2020 which did not show any evidence of  metastatic disease or recurrence.  CEA was 6.7 and in her normal range. -We will plan to see her back in 3 months with repeat CEA level.  2. Peripheral neuropathy: -Mild neuropathy in the feet from prior oxaliplatin with no pain.  3. Vitamin D deficiency: -Vitamin D is 26.05.  I have reinforced her to take vitamin D daily.  4. Smoking history: -She has an upcoming low-dose CT scan in December 2021.   Orders placed this encounter:  No orders of the defined types were placed in this encounter.    Derek Jack, MD [place] [phone]   I, Milinda Antis, am acting as a scribe for Dr. Sanda Linger.  I, Derek Jack MD, have reviewed the above documentation for accuracy and completeness, and I agree with the above.

## 2020-10-14 ENCOUNTER — Other Ambulatory Visit (HOSPITAL_COMMUNITY)
Admission: RE | Admit: 2020-10-14 | Discharge: 2020-10-14 | Disposition: A | Discharge status: Home / Self Care | Payer: 59 | Source: Ambulatory Visit | Attending: Internal Medicine | Admitting: Internal Medicine

## 2020-10-14 ENCOUNTER — Encounter (HOSPITAL_COMMUNITY): Payer: Self-pay

## 2020-10-14 ENCOUNTER — Encounter (HOSPITAL_COMMUNITY)
Admission: RE | Admit: 2020-10-14 | Discharge: 2020-10-14 | Disposition: A | Discharge status: Home / Self Care | Payer: 59 | Source: Ambulatory Visit | Attending: Internal Medicine | Admitting: Internal Medicine

## 2020-10-14 ENCOUNTER — Other Ambulatory Visit: Payer: Self-pay

## 2020-10-14 DIAGNOSIS — Z01812 Encounter for preprocedural laboratory examination: Secondary | ICD-10-CM | POA: Diagnosis not present

## 2020-10-14 DIAGNOSIS — Z85038 Personal history of other malignant neoplasm of large intestine: Secondary | ICD-10-CM

## 2020-10-14 DIAGNOSIS — Z20822 Contact with and (suspected) exposure to covid-19: Secondary | ICD-10-CM | POA: Diagnosis not present

## 2020-10-14 DIAGNOSIS — Z86010 Personal history of colonic polyps: Secondary | ICD-10-CM

## 2020-10-15 LAB — SARS CORONAVIRUS 2 (TAT 6-24 HRS): SARS Coronavirus 2: NEGATIVE

## 2020-10-16 ENCOUNTER — Encounter (HOSPITAL_COMMUNITY)
Admission: RE | Disposition: A | Discharge status: Home / Self Care | Payer: Self-pay | Source: Home / Self Care | Attending: Internal Medicine

## 2020-10-16 ENCOUNTER — Other Ambulatory Visit: Payer: Self-pay

## 2020-10-16 ENCOUNTER — Encounter (HOSPITAL_COMMUNITY): Payer: Self-pay | Admitting: Internal Medicine

## 2020-10-16 ENCOUNTER — Ambulatory Visit (HOSPITAL_COMMUNITY)
Admission: RE | Admit: 2020-10-16 | Discharge: 2020-10-16 | Disposition: A | Discharge status: Home / Self Care | Payer: 59 | Attending: Internal Medicine | Admitting: Internal Medicine

## 2020-10-16 ENCOUNTER — Ambulatory Visit (HOSPITAL_COMMUNITY): Payer: 59 | Admitting: Anesthesiology

## 2020-10-16 DIAGNOSIS — K635 Polyp of colon: Secondary | ICD-10-CM | POA: Insufficient documentation

## 2020-10-16 DIAGNOSIS — Z809 Family history of malignant neoplasm, unspecified: Secondary | ICD-10-CM | POA: Diagnosis not present

## 2020-10-16 DIAGNOSIS — F17200 Nicotine dependence, unspecified, uncomplicated: Secondary | ICD-10-CM | POA: Diagnosis not present

## 2020-10-16 DIAGNOSIS — K6289 Other specified diseases of anus and rectum: Secondary | ICD-10-CM | POA: Diagnosis not present

## 2020-10-16 DIAGNOSIS — K644 Residual hemorrhoidal skin tags: Secondary | ICD-10-CM | POA: Diagnosis not present

## 2020-10-16 DIAGNOSIS — Z85038 Personal history of other malignant neoplasm of large intestine: Secondary | ICD-10-CM | POA: Diagnosis not present

## 2020-10-16 DIAGNOSIS — Z08 Encounter for follow-up examination after completed treatment for malignant neoplasm: Secondary | ICD-10-CM | POA: Diagnosis present

## 2020-10-16 DIAGNOSIS — Z980 Intestinal bypass and anastomosis status: Secondary | ICD-10-CM

## 2020-10-16 DIAGNOSIS — Z86010 Personal history of colonic polyps: Secondary | ICD-10-CM

## 2020-10-16 DIAGNOSIS — D123 Benign neoplasm of transverse colon: Secondary | ICD-10-CM | POA: Diagnosis not present

## 2020-10-16 HISTORY — PX: POLYPECTOMY: SHX5525

## 2020-10-16 HISTORY — PX: COLONOSCOPY WITH PROPOFOL: SHX5780

## 2020-10-16 LAB — GLUCOSE, CAPILLARY: Glucose-Capillary: 111 mg/dL — ABNORMAL HIGH (ref 70–99)

## 2020-10-16 LAB — HM COLONOSCOPY

## 2020-10-16 SURGERY — COLONOSCOPY WITH PROPOFOL
Anesthesia: General

## 2020-10-16 MED ORDER — PROPOFOL 10 MG/ML IV BOLUS
INTRAVENOUS | Status: DC | PRN
  Administered 2020-10-16 (×2): 50 mg via INTRAVENOUS

## 2020-10-16 MED ORDER — STERILE WATER FOR IRRIGATION IR SOLN
Status: DC | PRN
  Administered 2020-10-16: 100 mL

## 2020-10-16 MED ORDER — PROPOFOL 500 MG/50ML IV EMUL
INTRAVENOUS | Status: DC | PRN
  Administered 2020-10-16: 100 ug/kg/min via INTRAVENOUS

## 2020-10-16 MED ORDER — CHLORHEXIDINE GLUCONATE CLOTH 2 % EX PADS: 6.0000 | MEDICATED_PAD | Freq: Once | CUTANEOUS | Status: DC

## 2020-10-16 MED ORDER — LACTATED RINGERS IV SOLN: INTRAVENOUS | Status: DC

## 2020-10-16 MED ORDER — LACTATED RINGERS IV SOLN: INTRAVENOUS | Status: DC | PRN

## 2020-10-16 NOTE — Transfer of Care (Signed)
Immediate Anesthesia Transfer of Care Note  Patient: Andrea Simon  Procedure(s) Performed: COLONOSCOPY WITH PROPOFOL (N/A ) POLYPECTOMY  Patient Location: PACU  Anesthesia Type:General  Level of Consciousness: awake, alert , oriented and patient cooperative  Airway & Oxygen Therapy: Patient Spontanous Breathing  Post-op Assessment: Report given to RN, Post -op Vital signs reviewed and stable and Patient moving all extremities X 4  Post vital signs: Reviewed and stable  Last Vitals:  Vitals Value Taken Time  BP    Temp    Pulse    Resp 20 10/16/20 0937  SpO2    Vitals shown include unvalidated device data.  Last Pain:  Vitals:   10/16/20 0915  TempSrc:   PainSc: 0-No pain         Complications: No complications documented.

## 2020-10-16 NOTE — H&P (Signed)
Andrea Simon is an 64 y.o. female.   Chief Complaint: Andrea Simon is here for colonoscopy. HPI: Andrea Simon is 64 year old African-American female who underwent colonoscopy in December 2019 for positive Cologuard and was found to have adenocarcinoma of ascending colon pT4b, pN1a.  She received 12 cycles of chemotherapy.  She is doing well.  She is here for surveillance colonoscopy.  She denies abdominal pain change in bowel habits or rectal bleeding.  Her appetite is good and weight stable. History is negative for CRC.  Past Medical History:  Diagnosis Date  . Cancer ([place]) 11/2018   Phreesia 09/16/2020  . Hyperlipidemia   . Insomnia due to anxiety and fear 01/27/2019  . Multinodular goiter   . Prediabetes     Past Surgical History:  Procedure Laterality Date  . BIOPSY  12/07/2018   Procedure: BIOPSY;  Surgeon: Rogene Houston, MD;  Location: [facility];  Service: Endoscopy;;  sigmoid colon  . COLON RESECTION N/A 01/09/2019   Procedure: LAPAROSCOPIC RIGHT HEMICOLECTOMY;  Surgeon: Virl Cagey, MD;  Location: [facility];  Service: General;  Laterality: N/A;  . COLON SURGERY N/A    Phreesia 09/16/2020  . COLONOSCOPY N/A 12/07/2018   Procedure: COLONOSCOPY;  Surgeon: Rogene Houston, MD;  Location: [facility];  Service: Endoscopy;  Laterality: N/A;  12:45  . DILATION AND CURETTAGE OF UTERUS  2006  . FNA thyroid  2011 and 2019   benign  . POLYPECTOMY  12/07/2018   Procedure: POLYPECTOMY;  Surgeon: Rogene Houston, MD;  Location: [facility];  Service: Endoscopy;;  splenic flexure (CS x1)  . PORTACATH PLACEMENT Left 02/15/2019   Procedure: INSERTION PORT-A-CATH;  Surgeon: Virl Cagey, MD;  Location: [facility];  Service: General;  Laterality: Left;  . TUBAL LIGATION  1992    Family History  Problem Relation Age of Onset  . Hypertension Mother        AAA  . Coronary artery disease Mother   . Hyperlipidemia Mother   . Cancer Mother        lung  . Hypertension Father   .  Cancer Brother    Social History:  reports that she has been smoking cigarettes. She has a 33.75 pack-year smoking history. She has never used smokeless tobacco. She reports current alcohol use. She reports that she does not use drugs.  Allergies: No Known Allergies  Medications Prior to Admission  Medication Sig Dispense Refill  . acetaminophen (TYLENOL) 500 MG tablet Take 1,000 mg by mouth every 6 (six) hours as needed for moderate pain.     Marland Kitchen amLODipine (NORVASC) 2.5 MG tablet Take 1 tablet (2.5 mg total) by mouth daily. 30 tablet 3  . aspirin EC 81 MG tablet Take 81 mg by mouth every evening.     Marland Kitchen buPROPion (WELLBUTRIN XL) 150 MG 24 hr tablet Take 1 tablet (150 mg total) by mouth daily. 30 tablet 3  . Calcium Carbonate-Vitamin D (CALCIUM 600+D) 600-400 MG-UNIT tablet Take 1 tablet by mouth daily.     Marland Kitchen lidocaine-prilocaine (EMLA) cream Apply 1 application topically daily as needed (prior to port flush).    . rosuvastatin (CRESTOR) 10 MG tablet Take 1 tablet (10 mg total) by mouth daily. 90 tablet 3  . SUTAB [id number] MG TABS TAKE 1 KIT BY MOUTH ONCE FOR 1 DOSE      Results for orders placed or performed during the hospital encounter of 10/16/20 (from the past 48 hour(s))  Glucose, capillary  Status: Abnormal   Collection Time: 10/16/20  8:49 AM  Result Value Ref Range   Glucose-Capillary 111 (H) 70 - 99 mg/dL    Comment: Glucose reference range applies only to samples taken after fasting for at least 8 hours.   No results found.  Review of Systems  Blood pressure 127/80, pulse 78, temperature 99 F (37.2 C), temperature source Oral, resp. rate 19, height _0  (1.6 m), weight 63.5 kg, SpO2 99 %. Physical Exam HENT:     Mouth/Throat:     Mouth: Mucous membranes are moist.     Pharynx: Oropharynx is clear.  Eyes:     Conjunctiva/sclera: Conjunctivae normal.  Cardiovascular:     Rate and Rhythm: Normal rate and regular rhythm.     Heart sounds: Normal heart sounds. No  murmur heard.   Pulmonary:     Effort: Pulmonary effort is normal.     Breath sounds: Normal breath sounds.  Abdominal:     General: There is no distension.     Palpations: Abdomen is soft. There is no mass.     Tenderness: There is no abdominal tenderness.     Comments: Spleen tip is palpable.  Musculoskeletal:     Cervical back: Neck supple.  Lymphadenopathy:     Cervical: No cervical adenopathy.  Skin:    General: Skin is warm and dry.  Neurological:     Mental Status: She is alert.      Assessment/Plan  History of colon carcinoma. Surveillance colonoscopy.  Hildred Laser, MD 10/16/2020, 9:10 AM

## 2020-10-16 NOTE — Anesthesia Preprocedure Evaluation (Signed)
Anesthesia Evaluation  Patient identified by MRN, date of birth, ID band Patient awake    Reviewed: Allergy & Precautions, NPO status , Patient's Chart, lab work & pertinent test results, reviewed documented beta blocker date and time   History of Anesthesia Complications Negative for: history of anesthetic complications  Airway Mallampati: II  TM Distance: >3 FB Neck ROM: Full    Dental no notable dental hx.    Pulmonary neg pulmonary ROS, Current Smoker,    Pulmonary exam normal breath sounds clear to auscultation       Cardiovascular hypertension, Normal cardiovascular exam Rhythm:Regular Rate:Normal     Neuro/Psych    GI/Hepatic negative GI ROS, Neg liver ROS,   Endo/Other    Renal/GU      Musculoskeletal negative musculoskeletal ROS (+)   Abdominal   Peds  Hematology negative hematology ROS (+)   Anesthesia Other Findings   Reproductive/Obstetrics                             Anesthesia Physical Anesthesia Plan  ASA: II  Anesthesia Plan: General   Post-op Pain Management:    Induction: Intravenous  PONV Risk Score and Plan:   Airway Management Planned: Nasal Cannula  Additional Equipment:   Intra-op Plan:   Post-operative Plan:   Informed Consent: I have reviewed the patients History and Physical, chart, labs and discussed the procedure including the risks, benefits and alternatives for the proposed anesthesia with the patient or authorized representative who has indicated his/her understanding and acceptance.     Dental advisory given  Plan Discussed with: CRNA  Anesthesia Plan Comments:         Anesthesia Quick Evaluation

## 2020-10-16 NOTE — Discharge Instructions (Signed)
Resume aspirin on 10/17/2020. Resume other medications and diet as before. No driving for 24 hours. Physician will call with biopsy results.   Colon Polyps  Polyps are tissue growths inside the body. Polyps can grow in many places, including the large intestine (colon). A polyp may be a round bump or a mushroom-shaped growth. You could have one polyp or several. Most colon polyps are noncancerous (benign). However, some colon polyps can become cancerous over time. Finding and removing the polyps early can help prevent this. What are the causes? The exact cause of colon polyps is not known. What increases the risk? You are more likely to develop this condition if you:  Have a family history of colon cancer or colon polyps.  Are older than 72 or older than 45 if you are African American.  Have inflammatory bowel disease, such as ulcerative colitis or Crohn's disease.  Have certain hereditary conditions, such as: ? Familial adenomatous polyposis. ? Lynch syndrome. ? Turcot syndrome. ? Peutz-Jeghers syndrome.  Are overweight.  Smoke cigarettes.  Do not get enough exercise.  Drink too much alcohol.  Eat a diet that is high in fat and red meat and low in fiber.  Had childhood cancer that was treated with abdominal radiation. What are the signs or symptoms? Most polyps do not cause symptoms. If you have symptoms, they may include:  Blood coming from your rectum when having a bowel movement.  Blood in your stool. The stool may look dark red or black.  Abdominal pain.  A change in bowel habits, such as constipation or diarrhea. How is this diagnosed? This condition is diagnosed with a colonoscopy. This is a procedure in which a lighted, flexible scope is inserted into the anus and then passed into the colon to examine the area. Polyps are sometimes found when a colonoscopy is done as part of routine cancer screening tests. How is this treated? Treatment for this condition  involves removing any polyps that are found. Most polyps can be removed during a colonoscopy. Those polyps will then be tested for cancer. Additional treatment may be needed depending on the results of testing. Follow these instructions at home: Lifestyle  Maintain a healthy weight, or lose weight if recommended by your health care provider.  Exercise every day or as told by your health care provider.  Do not use any products that contain nicotine or tobacco, such as cigarettes and e-cigarettes. If you need help quitting, ask your health care provider.  If you drink alcohol, limit how much you have: ? 0-1 drink a day for women. ? 0-2 drinks a day for men.  Be aware of how much alcohol is in your drink. In the [place] one drink equals one 12 oz bottle of beer (355 mL), one 5 oz glass of wine (148 mL), or one 1 oz shot of hard liquor (44 mL). Eating and drinking   Eat foods that are high in fiber, such as fruits, vegetables, and whole grains.  Eat foods that are high in calcium and vitamin D, such as milk, cheese, yogurt, eggs, liver, fish, and broccoli.  Limit foods that are high in fat, such as fried foods and desserts.  Limit the amount of red meat and processed meat you eat, such as hot dogs, sausage, bacon, and lunch meats. General instructions  Keep all follow-up visits as told by your health care provider. This is important. ? This includes having regularly scheduled colonoscopies. ? Talk to your health care provider about  when you need a colonoscopy. Contact a health care provider if:  You have new or worsening bleeding during a bowel movement.  You have new or increased blood in your stool.  You have a change in bowel habits.  You lose weight for no known reason. Summary  Polyps are tissue growths inside the body. Polyps can grow in many places, including the colon.  Most colon polyps are noncancerous (benign), but some can become cancerous over time.  This  condition is diagnosed with a colonoscopy.  Treatment for this condition involves removing any polyps that are found. Most polyps can be removed during a colonoscopy. This information is not intended to replace advice given to you by your health care provider. Make sure you discuss any questions you have with your health care provider. Document Revised: 03/24/2018 Document Reviewed: 03/24/2018 Elsevier Patient Education  [place].  Hemorrhoids Hemorrhoids are swollen veins that may develop:  In the butt (rectum). These are called internal hemorrhoids.  Around the opening of the butt (anus). These are called external hemorrhoids. Hemorrhoids can cause pain, itching, or bleeding. Most of the time, they do not cause serious problems. They usually get better with diet changes, lifestyle changes, and other home treatments. What are the causes? This condition may be caused by:  Having trouble pooping (constipation).  Pushing hard (straining) to poop.  Watery poop (diarrhea).  Pregnancy.  Being very overweight (obese).  Sitting for long periods of time.  Heavy lifting or other activity that causes you to strain.  Anal sex.  Riding a bike for a long period of time. What are the signs or symptoms? Symptoms of this condition include:  Pain.  Itching or soreness in the butt.  Bleeding from the butt.  Leaking poop.  Swelling in the area.  One or more lumps around the opening of your butt. How is this diagnosed? A doctor can often diagnose this condition by looking at the affected area. The doctor may also:  Do an exam that involves feeling the area with a gloved hand (digital rectal exam).  Examine the area inside your butt using a small tube (anoscope).  Order blood tests. This may be done if you have lost a lot of blood.  Have you get a test that involves looking inside the colon using a flexible tube with a camera on the end (sigmoidoscopy or  colonoscopy). How is this treated? This condition can usually be treated at home. Your doctor may tell you to change what you eat, make lifestyle changes, or try home treatments. If these do not help, procedures can be done to remove the hemorrhoids or make them smaller. These may involve:  Placing rubber bands at the base of the hemorrhoids to cut off their blood supply.  Injecting medicine into the hemorrhoids to shrink them.  Shining a type of light energy onto the hemorrhoids to cause them to fall off.  Doing surgery to remove the hemorrhoids or cut off their blood supply. Follow these instructions at home: Eating and drinking   Eat foods that have a lot of fiber in them. These include whole grains, beans, nuts, fruits, and vegetables.  Ask your doctor about taking products that have added fiber (fibersupplements).  Reduce the amount of fat in your diet. You can do this by: ? Eating low-fat dairy products. ? Eating less red meat. ? Avoiding processed foods.  Drink enough fluid to keep your pee (urine) pale yellow. Managing pain and swelling  Take a warm-water bath (sitz bath) for 20 minutes to ease pain. Do this 3-4 times a day. You may do this in a bathtub or using a portable sitz bath that fits over the toilet.  If told, put ice on the painful area. It may be helpful to use ice between your warm baths. ? Put ice in a plastic bag. ? Place a towel between your skin and the bag. ? Leave the ice on for 20 minutes, 2-3 times a day. General instructions  Take over-the-counter and prescription medicines only as told by your doctor. ? Medicated creams and medicines may be used as told.  Exercise often. Ask your doctor how much and what kind of exercise is best for you.  Go to the bathroom when you have the urge to poop. Do not wait.  Avoid pushing too hard when you poop.  Keep your butt dry and clean. Use wet toilet paper or moist towelettes after pooping.  Do not sit on  the toilet for a long time.  Keep all follow-up visits as told by your doctor. This is important. Contact a doctor if you:  Have pain and swelling that do not get better with treatment or medicine.  Have trouble pooping.  Cannot poop.  Have pain or swelling outside the area of the hemorrhoids. Get help right away if you have:  Bleeding that will not stop. Summary  Hemorrhoids are swollen veins in the butt or around the opening of the butt.  They can cause pain, itching, or bleeding.  Eat foods that have a lot of fiber in them. These include whole grains, beans, nuts, fruits, and vegetables.  Take a warm-water bath (sitz bath) for 20 minutes to ease pain. Do this 3-4 times a day. This information is not intended to replace advice given to you by your health care provider. Make sure you discuss any questions you have with your health care provider. Document Revised: 12/15/2018 Document Reviewed: 04/28/2018 Elsevier Patient Education  [place].

## 2020-10-16 NOTE — Op Note (Signed)
[facility] Patient Name: Andrea Simon Procedure Date: 10/16/2020 8:56 AM MRN: [id number] Date of Birth: 12-Sep-1956 Attending MD: Hildred Laser , MD CSN: [id number] Age: 64 Admit Type: Outpatient Procedure:                Colonoscopy Indications:              High risk colon cancer surveillance: Personal                            history of colon cancer Providers:                Hildred Laser, MD, Lambert Mody, Lurline Del,                            RN, Kristine L. Risa Grill, Technician, Nelma Rothman,                            [employer] officer Referring MD:             Norwood Levo. Moshe Cipro, MD and Derek Jack, MD Medicines:                Propofol per Anesthesia Complications:            No immediate complications. Estimated Blood Loss:     Estimated blood loss was minimal. Procedure:                Pre-Anesthesia Assessment:                           - Prior to the procedure, a History and Physical                            was performed, and patient medications and                            allergies were reviewed. [name] tolerance of                            previous anesthesia was also reviewed. The risks                            and benefits of the procedure and the sedation                            options and risks were discussed with the patient.                            All questions were answered, and informed consent                            was obtained. Prior Anticoagulants: The patient has                            taken no previous anticoagulant or antiplatelet  agents except for aspirin. ASA Grade Assessment: II                            - A patient with mild systemic disease. After                            reviewing the risks and benefits, the patient was                            deemed in satisfactory condition to undergo the                            procedure.                           After  obtaining informed consent, the colonoscope                            was passed under direct vision. Throughout the                            procedure, the patient's blood pressure, pulse, and                            oxygen saturations were monitored continuously. The                            [id number]-[id number] ([id number]) scope was introduced through                            the anus and advanced to the the ileocolonic                            anastomosis. The colonoscopy was performed without                            difficulty. The patient tolerated the procedure                            well. The quality of the bowel preparation was                            excellent. The terminal ileum and the rectum were                            photographed. Scope In: 9:17:10 AM Scope Out: 9:31:59 AM Scope Withdrawal Time: 0 hours 10 minutes 14 seconds  Total Procedure Duration: 0 hours 14 minutes 49 seconds  Findings:      The perianal and digital rectal examinations were normal.      The terminal ileum appeared normal.      There was evidence of a prior end-to-side ileo-colonic anastomosis at       the hepatic flexure. This was patent and was characterized by healthy       appearing mucosa. The anastomosis  was traversed.      A small polyp was found in the splenic flexure. The polyp was removed       with a cold snare. Resection and retrieval were complete.      The exam was otherwise normal throughout the examined colon.      External hemorrhoids were found during retroflexion. The hemorrhoids       were medium-sized.      Anal papilla(e) were hypertrophied. Impression:               - The examined portion of the ileum was normal.                           - Patent end-to-side ileo-colonic anastomosis,                            characterized by healthy appearing mucosa.                           - One small polyp at the splenic flexure, removed                            with a  cold snare. Resected and retrieved.                           - External hemorrhoids.                           - Anal papilla(e) were hypertrophied. Moderate Sedation:      Per Anesthesia Care Recommendation:           - Patient has a contact number available for                            emergencies. The signs and symptoms of potential                            delayed complications were discussed with the                            patient. Return to normal activities tomorrow.                            Written discharge instructions were provided to the                            patient.                           - Resume previous diet today.                           - Continue present medications.                           - No aspirin, ibuprofen, naproxen, or other  non-steroidal anti-inflammatory drugs for 1 day.                           - Await pathology results.                           - Repeat colonoscopy in 3 years for surveillance. Procedure Code(s):        --- Professional ---                           [phone], Colonoscopy, flexible; with removal of                            tumor(s), polyp(s), or other lesion(s) by snare                            technique Diagnosis Code(s):        --- Professional ---                           M25.003, Personal history of other malignant                            neoplasm of large intestine                           K64.4, Residual hemorrhoidal skin tags                           Z98.0, Intestinal bypass and anastomosis status                           K63.5, Polyp of colon                           K62.89, Other specified diseases of anus and rectum CPT copyright 2019 American Medical Association. All rights reserved. The codes documented in this report are preliminary and upon coder review may  be revised to meet current compliance requirements. Hildred Laser, MD Hildred Laser, MD 10/16/2020 9:41:07  AM This report has been signed electronically. Number of Addenda: 0

## 2020-10-16 NOTE — Addendum Note (Signed)
Addendum  created 10/16/20 1216 by Orlie Dakin, CRNA   Charge Capture section accepted

## 2020-10-16 NOTE — Anesthesia Postprocedure Evaluation (Signed)
Anesthesia Post Note  Patient: KAREN HUHTA  Procedure(s) Performed: COLONOSCOPY WITH PROPOFOL (N/A ) POLYPECTOMY  Patient location during evaluation: PACU Anesthesia Type: General Level of consciousness: awake, awake and alert, oriented and patient cooperative Pain management: pain level controlled Vital Signs Assessment: post-procedure vital signs reviewed and stable Respiratory status: spontaneous breathing, nonlabored ventilation and respiratory function stable Cardiovascular status: stable Postop Assessment: no apparent nausea or vomiting Anesthetic complications: no   No complications documented.   Last Vitals:  Vitals:   10/16/20 0851  BP: 127/80  Pulse: 78  Resp: 19  Temp: 37.2 C  SpO2: 99%    Last Pain:  Vitals:   10/16/20 0915  TempSrc:   PainSc: 0-No pain                 Harla Mensch

## 2020-10-17 LAB — SURGICAL PATHOLOGY

## 2020-10-21 ENCOUNTER — Encounter (HOSPITAL_COMMUNITY): Payer: Self-pay | Admitting: Internal Medicine

## 2020-10-23 ENCOUNTER — Telehealth (HOSPITAL_COMMUNITY): Payer: Self-pay

## 2020-10-23 NOTE — Telephone Encounter (Signed)
Spoke with the patient, detailed instructions given. She stated that she understood and would be here for her test. Asked to call back with any questions. S.Ellyn Rubiano EMTP 

## 2020-10-31 ENCOUNTER — Ambulatory Visit (HOSPITAL_BASED_OUTPATIENT_CLINIC_OR_DEPARTMENT_OTHER): Payer: 59

## 2020-10-31 ENCOUNTER — Other Ambulatory Visit: Payer: Self-pay

## 2020-10-31 ENCOUNTER — Other Ambulatory Visit: Payer: Self-pay | Admitting: Family Medicine

## 2020-10-31 ENCOUNTER — Ambulatory Visit (HOSPITAL_COMMUNITY): Payer: 59 | Attending: Internal Medicine

## 2020-10-31 DIAGNOSIS — R5383 Other fatigue: Secondary | ICD-10-CM | POA: Diagnosis present

## 2020-10-31 DIAGNOSIS — I2510 Atherosclerotic heart disease of native coronary artery without angina pectoris: Secondary | ICD-10-CM

## 2020-10-31 DIAGNOSIS — I10 Essential (primary) hypertension: Secondary | ICD-10-CM

## 2020-10-31 LAB — ECHOCARDIOGRAM COMPLETE
Area-P 1/2: 3.21 cm2
S' Lateral: 2.9 cm

## 2020-10-31 MED ORDER — TECHNETIUM TC 99M TETROFOSMIN IV KIT
10.2000 | PACK | Freq: Once | INTRAVENOUS | Status: AC | PRN
  Administered 2020-10-31: 10.2 via INTRAVENOUS
  Filled 2020-10-31: qty 11

## 2020-11-06 ENCOUNTER — Telehealth (HOSPITAL_COMMUNITY): Payer: Self-pay | Admitting: *Deleted

## 2020-11-06 NOTE — Telephone Encounter (Signed)
Patient given detailed instructions per Myocardial Perfusion Study Information Sheet for the test on 11/11/20 at 8:30. Patient notified to arrive 15 minutes early and that it is imperative to arrive on time for appointment to keep from having the test rescheduled.  If you need to cancel or reschedule your appointment, please call the office within 24 hours of your appointment. . Patient verbalized understanding.Andrea Simon

## 2020-11-07 ENCOUNTER — Encounter: Payer: Self-pay | Admitting: *Deleted

## 2020-11-11 ENCOUNTER — Ambulatory Visit (HOSPITAL_COMMUNITY): Payer: 59 | Attending: Cardiology

## 2020-11-11 ENCOUNTER — Other Ambulatory Visit: Payer: Self-pay

## 2020-11-11 DIAGNOSIS — R5383 Other fatigue: Secondary | ICD-10-CM | POA: Diagnosis present

## 2020-11-11 DIAGNOSIS — I2510 Atherosclerotic heart disease of native coronary artery without angina pectoris: Secondary | ICD-10-CM | POA: Diagnosis not present

## 2020-11-11 LAB — MYOCARDIAL PERFUSION IMAGING
LV dias vol: 45 mL (ref 46–106)
LV sys vol: 15 mL
Peak HR: 105 {beats}/min
Rest HR: 80 {beats}/min
SDS: 3
SRS: 0
SSS: 3
TID: 0.92

## 2020-11-11 MED ORDER — TECHNETIUM TC 99M TETROFOSMIN IV KIT
30.4000 | PACK | Freq: Once | INTRAVENOUS | Status: AC | PRN
  Administered 2020-11-11: 30.4 via INTRAVENOUS
  Filled 2020-11-11: qty 31

## 2020-11-11 MED ORDER — REGADENOSON 0.4 MG/5ML IV SOLN
0.4000 mg | Freq: Once | INTRAVENOUS | Status: AC
  Administered 2020-11-11: 0.4 mg via INTRAVENOUS

## 2020-11-28 ENCOUNTER — Other Ambulatory Visit: Payer: Self-pay | Admitting: Family Medicine

## 2020-11-28 ENCOUNTER — Other Ambulatory Visit (HOSPITAL_COMMUNITY): Payer: Self-pay

## 2020-11-28 DIAGNOSIS — I10 Essential (primary) hypertension: Secondary | ICD-10-CM

## 2020-11-28 DIAGNOSIS — Z87891 Personal history of nicotine dependence: Secondary | ICD-10-CM

## 2020-11-28 DIAGNOSIS — Z122 Encounter for screening for malignant neoplasm of respiratory organs: Secondary | ICD-10-CM

## 2020-11-28 NOTE — Progress Notes (Signed)
Patient LDCT scheduled for 12/26/19 at 1545. Patient aware.

## 2020-11-29 ENCOUNTER — Encounter (INDEPENDENT_AMBULATORY_CARE_PROVIDER_SITE_OTHER): Payer: Self-pay | Admitting: *Deleted

## 2020-12-17 ENCOUNTER — Ambulatory Visit: Payer: 59 | Admitting: Family Medicine

## 2020-12-23 ENCOUNTER — Ambulatory Visit: Payer: 59 | Admitting: Cardiovascular Disease

## 2020-12-25 ENCOUNTER — Ambulatory Visit (HOSPITAL_COMMUNITY)
Admission: RE | Admit: 2020-12-25 | Discharge: 2020-12-25 | Disposition: A | Discharge status: Home / Self Care | Payer: 59 | Source: Ambulatory Visit | Attending: Oncology | Admitting: Oncology

## 2020-12-25 ENCOUNTER — Other Ambulatory Visit: Payer: Self-pay

## 2020-12-25 DIAGNOSIS — Z87891 Personal history of nicotine dependence: Secondary | ICD-10-CM

## 2020-12-25 DIAGNOSIS — Z122 Encounter for screening for malignant neoplasm of respiratory organs: Secondary | ICD-10-CM | POA: Insufficient documentation

## 2020-12-26 ENCOUNTER — Encounter (HOSPITAL_COMMUNITY): Payer: Self-pay

## 2020-12-26 NOTE — Progress Notes (Signed)
Patient notified of LDCT Lung Cancer Screening Results via mail with the recommendation to follow-up in 12 months. Patient's referring provider has been sent a copy of results. Results are as follows:   IMPRESSION: 1. Lung-RADS 2, benign appearance or behavior. Continue annual screening with low-dose chest CT without contrast in 12 months. 2.  Emphysema (ICD10-J43.9) and Aortic Atherosclerosis (ICD10-170.0) 

## 2020-12-31 ENCOUNTER — Other Ambulatory Visit: Payer: Self-pay | Admitting: Family Medicine

## 2020-12-31 DIAGNOSIS — I10 Essential (primary) hypertension: Secondary | ICD-10-CM

## 2021-01-07 ENCOUNTER — Ambulatory Visit: Payer: 59 | Admitting: Family Medicine

## 2021-01-08 ENCOUNTER — Other Ambulatory Visit: Payer: Self-pay

## 2021-01-08 ENCOUNTER — Telehealth (INDEPENDENT_AMBULATORY_CARE_PROVIDER_SITE_OTHER): Payer: 59 | Admitting: Family Medicine

## 2021-01-08 ENCOUNTER — Encounter: Payer: Self-pay | Admitting: Family Medicine

## 2021-01-08 DIAGNOSIS — F321 Major depressive disorder, single episode, moderate: Secondary | ICD-10-CM | POA: Diagnosis not present

## 2021-01-08 DIAGNOSIS — F17200 Nicotine dependence, unspecified, uncomplicated: Secondary | ICD-10-CM

## 2021-01-08 DIAGNOSIS — R7301 Impaired fasting glucose: Secondary | ICD-10-CM

## 2021-01-08 DIAGNOSIS — R7303 Prediabetes: Secondary | ICD-10-CM

## 2021-01-08 DIAGNOSIS — I10 Essential (primary) hypertension: Secondary | ICD-10-CM

## 2021-01-08 DIAGNOSIS — E782 Mixed hyperlipidemia: Secondary | ICD-10-CM

## 2021-01-08 NOTE — Patient Instructions (Addendum)
F/U in 5 months, call if you need me sooner  Continue to work on smoking cessation  Thankful that you are much improved, no medication changes  Nurse visit for TdAP end February, on the same day for  fasting lipid, cmp and EGFr and HBA1C  It is important that you exercise regularly at least 30 minutes 5 times a week. If you develop chest pain, have severe difficulty breathing, or feel very tired, stop exercising immediately and seek medical attention  Think about what you will eat, plan ahead. Choose " clean, green, fresh or frozen" over canned, processed or packaged foods which are more sugary, salty and fatty. 70 to 75% of food eaten should be vegetables and fruit. Three meals at set times with snacks allowed between meals, but they must be fruit or vegetables. Aim to eat over a 12 hour period , example 7 am to 7 pm, and STOP after  your last meal of the day. Drink water,generally about 64 ounces per day, no other drink is as healthy. Fruit juice is best enjoyed in a healthy way, by EATING the fruit.  Thanks for choosing [facility], we consider it a privelige to serve you.

## 2021-01-08 NOTE — Progress Notes (Unsigned)
Virtual Visit via Telephone Note  I connected with Andrea Simon on 01/08/21 at  2:00 PM EST by telephone and verified that I am speaking with the correct person using two identifiers.  Location: Patient: home Provider: work   I discussed the limitations, risks, security and privacy concerns of performing an evaluation and management service by telephone and the availability of in person appointments. I also discussed with the patient that there may be a patient responsible charge related to this service. The patient expressed understanding and agreed to proceed.   History of Present Illness: F/U chronic problems and depression andcigarette smoking Feels better overall and has reduced nicotine dependence Wants to review cardiology evaluation, which is re assuring for CAD Denies recent fever or chills. Denies sinus pressure, nasal congestion, ear pain or sore throat. Denies chest congestion, productive cough or wheezing. Denies chest pains, palpitations and leg swelling Denies abdominal pain, nausea, vomiting,diarrhea or constipation.   Denies dysuria, frequency, hesitancy or incontinence. Denies joint pain, swelling and limitation in mobility. Denies headaches, seizures, numbness, or tingling. Denies uncontrolled  depression, anxiety or insomnia. Denies skin break down or rash.       Observations/Objective: There were no vitals taken for this visit. Good communication with no confusion and intact memory. Alert and oriented x 3 No signs of respiratory distress during speech    Assessment and Plan: Depression, major, single episode, moderate (HCC) Excellent response to medication , continue same. No interest in therapy  Essential hypertension DASH diet and commitment to daily physical activity for a minimum of 30 minutes discussed and encouraged, as a part of hypertension management. The importance of attaining a healthy weight is also discussed.  BP/Weight 10/16/2020  10/10/2020 10/09/2020 10/01/2020 09/19/2020 07/04/2020 6/57/8469  Systolic BP 629 528 413 244 010 272 536  Diastolic BP 86 80 74 84 77 79 84  Wt. (Lbs) 139.99 138.6 141 - 144 141.1 141  BMI 24.8 24.55 24.98 - 25.51 24.99 24.98       NICOTINE ADDICTION Asked:confirms currently smokes cigarettes, has reduced cigarette use Assess: Unwilling to set a quit date, but is cutting back Advise: needs to QUIT to reduce risk of cancer, cardio and cerebrovascular disease Assist: counseled for 5 minutes and literature provided Arrange: follow up in 2 to 4 months   Hyperlipemia Hyperlipidemia:Low fat diet discussed and encouraged.   Lipid Panel  Lab Results  Component Value Date   CHOL 187 09/12/2020   HDL 36 (L) 09/12/2020   LDLCALC 122 (H) 09/12/2020   TRIG 161 (H) 09/12/2020   CHOLHDL 5.2 (H) 09/12/2020  not at goal Updated lab needed at/ before next visit.      Prediabetes Patient educated about the importance of limiting  Carbohydrate intake , the need to commit to daily physical activity for a minimum of 30 minutes , and to commit weight loss. The fact that changes in all these areas will reduce or eliminate all together the development of diabetes is stressed.   Diabetic Labs Latest Ref Rng & Units 01/09/2021 10/01/2020 09/12/2020 07/03/2020 06/26/2020  HbA1c 4.0 - 5.6 % - - - 5.7(A) -  Microalbumin Not Estab. ug/mL - - - - -  Micro/Creat Ratio 0.0 - 30.0 mg/g creat - - - - -  Chol 100 - 199 mg/dL - - 187 - -  HDL >39 mg/dL - - 36(L) - -  Calc LDL 0 - 99 mg/dL - - 122(H) - -  Triglycerides 0 - 149 mg/dL - -  161(H) - -  Creatinine 0.44 - 1.00 mg/dL 0.83 0.78 0.81 - 0.69   BP/Weight 10/16/2020 10/10/2020 10/09/2020 10/01/2020 09/19/2020 07/04/2020 5/63/1497  Systolic BP 026 378 588 502 774 128 786  Diastolic BP 86 80 74 84 77 79 84  Wt. (Lbs) 139.99 138.6 141 - 144 141.1 141  BMI 24.8 24.55 24.98 - 25.51 24.99 24.98   Foot/eye exam completion dates Latest Ref Rng & Units  11/08/2018 08/18/2016  Eye Exam No Retinopathy No Retinopathy -  Foot Form Completion - - Done   Updated lab needed at/ before next visit.      Follow Up Instructions:    I discussed the assessment and treatment plan with the patient. The patient was provided an opportunity to ask questions and all were answered. The patient agreed with the plan and demonstrated an understanding of the instructions.   The patient was advised to call back or seek an in-person evaluation if the symptoms worsen or if the condition fails to improve as anticipated.  I provided 22 minutes of non-face-to-face time during this encounter.   Tula Nakayama, MD

## 2021-01-09 ENCOUNTER — Inpatient Hospital Stay (HOSPITAL_COMMUNITY): Payer: 59

## 2021-01-09 ENCOUNTER — Inpatient Hospital Stay (HOSPITAL_COMMUNITY): Payer: 59 | Attending: Hematology

## 2021-01-09 DIAGNOSIS — Z809 Family history of malignant neoplasm, unspecified: Secondary | ICD-10-CM | POA: Diagnosis not present

## 2021-01-09 DIAGNOSIS — E559 Vitamin D deficiency, unspecified: Secondary | ICD-10-CM | POA: Diagnosis not present

## 2021-01-09 DIAGNOSIS — G629 Polyneuropathy, unspecified: Secondary | ICD-10-CM | POA: Diagnosis not present

## 2021-01-09 DIAGNOSIS — Z8349 Family history of other endocrine, nutritional and metabolic diseases: Secondary | ICD-10-CM | POA: Diagnosis not present

## 2021-01-09 DIAGNOSIS — C184 Malignant neoplasm of transverse colon: Secondary | ICD-10-CM

## 2021-01-09 DIAGNOSIS — E785 Hyperlipidemia, unspecified: Secondary | ICD-10-CM | POA: Insufficient documentation

## 2021-01-09 DIAGNOSIS — F17210 Nicotine dependence, cigarettes, uncomplicated: Secondary | ICD-10-CM | POA: Insufficient documentation

## 2021-01-09 DIAGNOSIS — Z8249 Family history of ischemic heart disease and other diseases of the circulatory system: Secondary | ICD-10-CM | POA: Insufficient documentation

## 2021-01-09 DIAGNOSIS — I700 Atherosclerosis of aorta: Secondary | ICD-10-CM | POA: Diagnosis not present

## 2021-01-09 DIAGNOSIS — J439 Emphysema, unspecified: Secondary | ICD-10-CM | POA: Insufficient documentation

## 2021-01-09 DIAGNOSIS — R200 Anesthesia of skin: Secondary | ICD-10-CM | POA: Insufficient documentation

## 2021-01-09 DIAGNOSIS — R5383 Other fatigue: Secondary | ICD-10-CM | POA: Insufficient documentation

## 2021-01-09 DIAGNOSIS — Z801 Family history of malignant neoplasm of trachea, bronchus and lung: Secondary | ICD-10-CM | POA: Insufficient documentation

## 2021-01-09 DIAGNOSIS — Z95828 Presence of other vascular implants and grafts: Secondary | ICD-10-CM

## 2021-01-09 DIAGNOSIS — Z79899 Other long term (current) drug therapy: Secondary | ICD-10-CM | POA: Diagnosis not present

## 2021-01-09 DIAGNOSIS — M549 Dorsalgia, unspecified: Secondary | ICD-10-CM | POA: Diagnosis not present

## 2021-01-09 LAB — COMPREHENSIVE METABOLIC PANEL
ALT: 33 U/L (ref 0–44)
AST: 33 U/L (ref 15–41)
Albumin: 4.6 g/dL (ref 3.5–5.0)
Alkaline Phosphatase: 59 U/L (ref 38–126)
Anion gap: 8 (ref 5–15)
BUN: 11 mg/dL (ref 8–23)
CO2: 28 mmol/L (ref 22–32)
Calcium: 9.7 mg/dL (ref 8.9–10.3)
Chloride: 102 mmol/L (ref 98–111)
Creatinine, Ser: 0.83 mg/dL (ref 0.44–1.00)
GFR, Estimated: >60 mL/min (ref >60)
Glucose, Bld: 101 mg/dL — ABNORMAL HIGH (ref 70–99)
Potassium: 3.4 mmol/L — ABNORMAL LOW (ref 3.5–5.1)
Sodium: 138 mmol/L (ref 135–145)
Total Bilirubin: 1.4 mg/dL — ABNORMAL HIGH (ref 0.3–1.2)
Total Protein: 7.6 g/dL (ref 6.5–8.1)

## 2021-01-09 LAB — CBC WITH DIFFERENTIAL/PLATELET
Abs Immature Granulocytes: 0.04 10*3/uL (ref 0.00–0.07)
Basophils Absolute: 0 10*3/uL (ref 0.0–0.1)
Basophils Relative: 1 %
Eosinophils Absolute: 0.1 10*3/uL (ref 0.0–0.5)
Eosinophils Relative: 1 %
HCT: 37.4 % (ref 36.0–46.0)
Hemoglobin: 13.1 g/dL (ref 12.0–15.0)
Immature Granulocytes: 1 %
Lymphocytes Relative: 45 %
Lymphs Abs: 2.7 10*3/uL (ref 0.7–4.0)
MCH: 40.3 pg — ABNORMAL HIGH (ref 26.0–34.0)
MCHC: 35 g/dL (ref 30.0–36.0)
MCV: 115.1 fL — ABNORMAL HIGH (ref 80.0–100.0)
Monocytes Absolute: 0.4 10*3/uL (ref 0.1–1.0)
Monocytes Relative: 7 %
Neutro Abs: 2.7 10*3/uL (ref 1.7–7.7)
Neutrophils Relative %: 45 %
Platelets: 244 10*3/uL (ref 150–400)
RBC: 3.25 MIL/uL — ABNORMAL LOW (ref 3.87–5.11)
RDW: 13.2 % (ref 11.5–15.5)
WBC: 6 10*3/uL (ref 4.0–10.5)
nRBC: 0 % (ref 0.0–0.2)

## 2021-01-09 LAB — VITAMIN D 25 HYDROXY (VIT D DEFICIENCY, FRACTURES): Vit D, 25-Hydroxy: 46.85 ng/mL (ref 30–100)

## 2021-01-09 MED ORDER — SODIUM CHLORIDE 0.9% FLUSH
10.0000 mL | INTRAVENOUS | Status: AC | PRN
  Administered 2021-01-09: 10 mL via INTRAVENOUS

## 2021-01-09 MED ORDER — HEPARIN SOD (PORK) LOCK FLUSH 100 UNIT/ML IV SOLN
500.0000 [IU] | Freq: Once | INTRAVENOUS | Status: AC
  Administered 2021-01-09: 500 [IU] via INTRAVENOUS

## 2021-01-10 LAB — CEA: CEA: 6 ng/mL — ABNORMAL HIGH (ref 0.0–4.7)

## 2021-01-11 ENCOUNTER — Encounter: Payer: Self-pay | Admitting: Family Medicine

## 2021-01-11 NOTE — Assessment & Plan Note (Signed)
Excellent response to medication , continue same. No interest in therapy

## 2021-01-11 NOTE — Assessment & Plan Note (Addendum)
Asked:confirms currently smokes cigarettes, has reduced cigarette use Assess: Unwilling to set a quit date, but is cutting back Advise: needs to QUIT to reduce risk of cancer, cardio and cerebrovascular disease Assist: counseled for 5 minutes and literature provided Arrange: follow up in 2 to 4 months

## 2021-01-11 NOTE — Assessment & Plan Note (Signed)
DASH diet and commitment to daily physical activity for a minimum of 30 minutes discussed and encouraged, as a part of hypertension management. The importance of attaining a healthy weight is also discussed.  BP/Weight 10/16/2020 10/10/2020 10/09/2020 10/01/2020 09/19/2020 07/04/2020 5/91/6384  Systolic BP 665 993 570 177 939 030 092  Diastolic BP 86 80 74 84 77 79 84  Wt. (Lbs) 139.99 138.6 141 - 144 141.1 141  BMI 24.8 24.55 24.98 - 25.51 24.99 24.98

## 2021-01-11 NOTE — Assessment & Plan Note (Signed)
Patient educated about the importance of limiting  Carbohydrate intake , the need to commit to daily physical activity for a minimum of 30 minutes , and to commit weight loss. The fact that changes in all these areas will reduce or eliminate all together the development of diabetes is stressed.   Diabetic Labs Latest Ref Rng & Units 01/09/2021 10/01/2020 09/12/2020 07/03/2020 06/26/2020  HbA1c 4.0 - 5.6 % - - - 5.7(A) -  Microalbumin Not Estab. ug/mL - - - - -  Micro/Creat Ratio 0.0 - 30.0 mg/g creat - - - - -  Chol 100 - 199 mg/dL - - 187 - -  HDL >39 mg/dL - - 36(L) - -  Calc LDL 0 - 99 mg/dL - - 122(H) - -  Triglycerides 0 - 149 mg/dL - - 161(H) - -  Creatinine 0.44 - 1.00 mg/dL 0.83 0.78 0.81 - 0.69   BP/Weight 10/16/2020 10/10/2020 10/09/2020 10/01/2020 09/19/2020 07/04/2020 1/74/9449  Systolic BP 675 916 384 665 993 570 177  Diastolic BP 86 80 74 84 77 79 84  Wt. (Lbs) 139.99 138.6 141 - 144 141.1 141  BMI 24.8 24.55 24.98 - 25.51 24.99 24.98   Foot/eye exam completion dates Latest Ref Rng & Units 11/08/2018 08/18/2016  Eye Exam No Retinopathy No Retinopathy -  Foot Form Completion - - Done   Updated lab needed at/ before next visit.

## 2021-01-11 NOTE — Assessment & Plan Note (Signed)
Hyperlipidemia:Low fat diet discussed and encouraged.   Lipid Panel  Lab Results  Component Value Date   CHOL 187 09/12/2020   HDL 36 (L) 09/12/2020   LDLCALC 122 (H) 09/12/2020   TRIG 161 (H) 09/12/2020   CHOLHDL 5.2 (H) 09/12/2020  not at goal Updated lab needed at/ before next visit.

## 2021-01-13 ENCOUNTER — Ambulatory Visit: Payer: 59 | Admitting: Cardiology

## 2021-01-16 ENCOUNTER — Other Ambulatory Visit: Payer: Self-pay

## 2021-01-16 ENCOUNTER — Inpatient Hospital Stay (HOSPITAL_BASED_OUTPATIENT_CLINIC_OR_DEPARTMENT_OTHER): Payer: 59 | Admitting: Hematology

## 2021-01-16 ENCOUNTER — Encounter (HOSPITAL_COMMUNITY): Payer: Self-pay | Admitting: Hematology

## 2021-01-16 VITALS — BP 120/72 | HR 82 | Temp 97.2°F | Resp 18 | Wt 147.1 lb

## 2021-01-16 DIAGNOSIS — C184 Malignant neoplasm of transverse colon: Secondary | ICD-10-CM

## 2021-01-16 NOTE — Progress Notes (Addendum)
Andrea Simon, [place] [address]   CLINIC:  Medical Oncology/Hematology  PCP:  Fayrene Helper, MD [address], [address] / [place] [address] [phone]   REASON FOR VISIT:  Follow-up for stage III transverse colon adenocarcinoma  PRIOR THERAPY:  1. Laparoscopic right colectomy on 01/09/2019. 2. FOLFOX x 12 cycles from 03/01/2019 to 08/22/2019.  NGS Results: Not done  CURRENT THERAPY: Observation  BRIEF ONCOLOGIC HISTORY:  Oncology History  Colon cancer ([place])  01/09/2019 Initial Diagnosis   Colon cancer ([place])   03/01/2019 -  Chemotherapy   The patient had palonosetron (ALOXI) injection 0.25 mg, 0.25 mg, Intravenous,  Once, 12 of 12 cycles Administration: 0.25 mg (03/01/2019), 0.25 mg (03/15/2019), 0.25 mg (03/27/2019), 0.25 mg (04/10/2019), 0.25 mg (05/09/2019), 0.25 mg (05/23/2019), 0.25 mg (06/06/2019), 0.25 mg (06/20/2019), 0.25 mg (07/25/2019), 0.25 mg (07/11/2019), 0.25 mg (08/08/2019), 0.25 mg (08/22/2019) pegfilgrastim (NEULASTA) injection 6 mg, 6 mg, Subcutaneous, Once, 8 of 8 cycles Administration: 6 mg (05/11/2019), 6 mg (05/25/2019), 6 mg (06/08/2019), 6 mg (06/22/2019), 6 mg (07/13/2019), 6 mg (07/27/2019), 6 mg (08/10/2019), 6 mg (08/24/2019) leucovorin 600 mg in dextrose 5 % 250 mL infusion, 640 mg, Intravenous,  Once, 12 of 12 cycles Administration: 600 mg (03/01/2019), 600 mg (03/15/2019), 640 mg (03/27/2019), 640 mg (04/10/2019), 640 mg (05/09/2019), 640 mg (05/23/2019), 640 mg (06/06/2019), 640 mg (06/20/2019), 640 mg (07/25/2019), 640 mg (07/11/2019), 640 mg (08/08/2019), 640 mg (08/22/2019) oxaliplatin (ELOXATIN) 135 mg in dextrose 5 % 500 mL chemo infusion, 85 mg/m2 = 135 mg, Intravenous,  Once, 10 of 10 cycles Dose modification: 68 mg/m2 (80 % of original dose 85 mg/m2, Cycle 5, Reason: Provider Judgment), 68 mg/m2 (original dose 85 mg/m2, Cycle 6, Reason: Provider Judgment), 51 mg/m2 (60 % of original dose 85 mg/m2, Cycle 10, Reason: Other (see comments),  Comment: neuropathy) Administration: 135 mg (03/01/2019), 135 mg (03/15/2019), 135 mg (03/27/2019), 135 mg (04/10/2019), 110 mg (05/09/2019), 110 mg (05/23/2019), 110 mg (06/06/2019), 110 mg (06/20/2019), 80 mg (07/25/2019), 110 mg (07/11/2019) fluorouracil (ADRUCIL) chemo injection 650 mg, 400 mg/m2 = 650 mg, Intravenous,  Once, 12 of 12 cycles Administration: 650 mg (03/01/2019), 650 mg (03/15/2019), 650 mg (03/27/2019), 650 mg (04/10/2019), 650 mg (05/09/2019), 650 mg (05/23/2019), 650 mg (06/06/2019), 650 mg (06/20/2019), 650 mg (07/25/2019), 650 mg (07/11/2019), 650 mg (08/08/2019), 650 mg (08/22/2019) fluorouracil (ADRUCIL) 3,850 mg in sodium chloride 0.9 % 73 mL chemo infusion, 2,400 mg/m2 = 3,850 mg, Intravenous, 1 Day/Dose, 12 of 12 cycles Administration: 3,850 mg (03/01/2019), 3,850 mg (03/15/2019), 3,850 mg (03/27/2019), 3,850 mg (04/10/2019), 3,850 mg (05/09/2019), 3,850 mg (05/23/2019), 3,850 mg (06/06/2019), 3,850 mg (06/20/2019), 3,850 mg (07/25/2019), 3,850 mg (07/11/2019), 3,850 mg (08/08/2019), 3,850 mg (08/22/2019)  for chemotherapy treatment.      CANCER STAGING: Cancer Staging No matching staging information was found for the patient.  INTERVAL HISTORY:  [name], a 65 y.o. female, returns for routine follow-up of her stage III transverse colon adenocarcinoma. Andrea Simon was last seen on 10/10/2020.   Today she reports feeling well. She denies having any abdominal pain, melena or hematochezia. She continues having numbness in the soles of her feet and toes; the numbness in her hands has resolved. She denies having any recent infections, F/C or night sweats. She continues taking vitamin D and ASA 81.   REVIEW OF SYSTEMS:  Review of Systems  Constitutional: Positive for fatigue (75%). Negative for appetite change, chills, diaphoresis and fever.  Gastrointestinal: Negative  for abdominal pain and blood in stool.  Musculoskeletal: Positive for back pain (2/10 upper back pain).  Neurological: Positive for  numbness (soles of feet).  All other systems reviewed and are negative.   PAST MEDICAL/SURGICAL HISTORY:  Past Medical History:  Diagnosis Date  . Cancer ([facility]) 11/2018   Phreesia 09/16/2020  . Hyperlipidemia   . Insomnia due to anxiety and fear 01/27/2019  . Multinodular goiter   . Prediabetes    Past Surgical History:  Procedure Laterality Date  . BIOPSY  12/07/2018   Procedure: BIOPSY;  Surgeon: Rogene Houston, MD;  [facility];  Service: Endoscopy;;  sigmoid colon  . COLON RESECTION N/A 01/09/2019   Procedure: LAPAROSCOPIC RIGHT HEMICOLECTOMY;  Surgeon: Virl Cagey, MD;  Location: [facility];  Service: General;  Laterality: N/A;  . COLON SURGERY N/A    Phreesia 09/16/2020  . COLONOSCOPY N/A 12/07/2018   Procedure: COLONOSCOPY;  Surgeon: Rogene Houston, MD;  Location: [facility];  Service: Endoscopy;  Laterality: N/A;  12:45  . COLONOSCOPY WITH PROPOFOL N/A 10/16/2020   Procedure: COLONOSCOPY WITH PROPOFOL;  Surgeon: Rogene Houston, MD;  [facility]: [facility];  Service: Endoscopy;  Laterality: N/A;  955  . DILATION AND CURETTAGE OF UTERUS  2006  . FNA thyroid  2011 and 2019   benign  . POLYPECTOMY  12/07/2018   Procedure: POLYPECTOMY;  Surgeon: Rogene Houston, MD;  [facility];  Service: Endoscopy;;  splenic flexure (CS x1)  . POLYPECTOMY  10/16/2020   Procedure: POLYPECTOMY;  Surgeon: Rogene Houston, MD;  [facility];  Service: Endoscopy;;  . PORTACATH PLACEMENT Left 02/15/2019   Procedure: INSERTION PORT-A-CATH;  Surgeon: Virl Cagey, MD;  Location: [facility];  Service: General;  Laterality: Left;  . TUBAL LIGATION  1992    SOCIAL HISTORY:  Social History   Socioeconomic History  . Marital status: Married    Spouse name: Not on file  . Number of children: 1  . Years of education: Not on file  . Highest education level: Not on file  Occupational History  . Occupation: Theme [employer] manager [employer]   Tobacco Use  .  Smoking status: Current Every Day Smoker    Packs/day: 0.75    Years: 45.00    Pack years: 33.75    Types: Cigarettes  . Smokeless tobacco: Never Used  Vaping Use  . Vaping Use: Never used  Substance and Sexual Activity  . Alcohol use: Yes    Alcohol/week: 0.0 standard drinks    Comment: occasionally  . Drug use: No  . Sexual activity: Yes  Other Topics Concern  . Not on file  Social History Narrative  . Not on file   Social Determinants of Health   Financial Resource Strain: Not on file  Food Insecurity: Not on file  Transportation Needs: Not on file  Physical Activity: Not on file  Stress: Not on file  Social Connections: Not on file  Intimate Partner Violence: Not on file    FAMILY HISTORY:  Family History  Problem Relation Age of Onset  . Hypertension Mother        AAA  . Coronary artery disease Mother   . Hyperlipidemia Mother   . Cancer Mother        lung  . Hypertension Father   . Cancer Brother     CURRENT MEDICATIONS:  Current Outpatient Medications  Medication Sig Dispense Refill  . acetaminophen (TYLENOL) 500 MG  tablet Take 1,000 mg by mouth every 6 (six) hours as needed for moderate pain.     Marland Kitchen amLODipine (NORVASC) 2.5 MG tablet Take 1 tablet by mouth once daily 30 tablet 0  . aspirin EC 81 MG tablet Take 1 tablet (81 mg total) by mouth every evening. 30 tablet 11  . buPROPion (WELLBUTRIN XL) 150 MG 24 hr tablet Take 1 tablet (150 mg total) by mouth daily. 30 tablet 3  . Calcium Carbonate-Vitamin D 600-400 MG-UNIT tablet Take 1 tablet by mouth daily.     Marland Kitchen lidocaine-prilocaine (EMLA) cream Apply 1 application topically daily as needed (prior to port flush).    . rosuvastatin (CRESTOR) 10 MG tablet Take 1 tablet (10 mg total) by mouth daily. 90 tablet 3   No current facility-administered medications for this visit.   Facility-Administered Medications Ordered in Other Visits  Medication Dose Route Frequency Provider Last Rate Last Admin  . sodium  chloride flush (NS) 0.9 % injection 10 mL  10 mL Intracatheter PRN Derek Jack, MD   10 mL at 04/26/19 0928  . sodium chloride flush (NS) 0.9 % injection 10 mL  10 mL Intravenous PRN Derek Jack, MD   10 mL at 01/09/21 1430    ALLERGIES:  No Known Allergies  PHYSICAL EXAM:  Performance status (ECOG): 1 - Symptomatic but completely ambulatory  Vitals:   01/16/21 1519  BP: 120/72  Pulse: 82  Resp: 18  Temp: (!) 97.2 F (36.2 C)  SpO2: 100%   Wt Readings from Last 3 Encounters:  01/16/21 147 lb 1.6 oz (66.7 kg)  11/11/20 141 lb (64 kg)  10/16/20 139 lb 15.9 oz (63.5 kg)   Physical Exam Vitals reviewed.  Constitutional:      Appearance: Normal appearance.  Cardiovascular:     Rate and Rhythm: Normal rate and regular rhythm.     Pulses: Normal pulses.     Heart sounds: Normal heart sounds.  Pulmonary:     Effort: Pulmonary effort is normal.     Breath sounds: Normal breath sounds.  Abdominal:     Palpations: Abdomen is soft. There is no hepatomegaly or mass.     Tenderness: There is no abdominal tenderness.     Hernia: No hernia is present.  Musculoskeletal:     Right lower leg: No edema.     Left lower leg: No edema.  Neurological:     General: No focal deficit present.     Mental Status: She is alert and oriented to person, place, and time.  Psychiatric:        Mood and Affect: Mood normal.        Behavior: Behavior normal.      LABORATORY DATA:  I have reviewed the labs as listed.  CBC Latest Ref Rng & Units 01/09/2021 10/01/2020 06/26/2020  WBC 4.0 - 10.5 K/uL 6.0 5.7 5.4  Hemoglobin 12.0 - 15.0 g/dL 13.1 12.8 12.1  Hematocrit 36.0 - 46.0 % 37.4 34.9(L) 33.9(L)  Platelets 150 - 400 K/uL 244 170 191   CMP Latest Ref Rng & Units 01/09/2021 10/01/2020 09/12/2020  Glucose 70 - 99 mg/dL 101(H) 160(H) 113(H)  BUN 8 - 23 mg/dL 11 6(L) 9  Creatinine 0.44 - 1.00 mg/dL 0.83 0.78 0.81  Sodium 135 - 145 mmol/L 138 136 140  Potassium 3.5 - 5.1 mmol/L  3.4(L) 3.2(L) 3.9  Chloride 98 - 111 mmol/L 102 100 102  CO2 22 - 32 mmol/L '28 26 26  ' Calcium 8.9 - 10.3  mg/dL 9.7 9.5 9.7  Total Protein 6.5 - 8.1 g/dL 7.6 7.4 7.2  Total Bilirubin 0.3 - 1.2 mg/dL 1.4(H) 1.7(H) 1.6(H)  Alkaline Phos 38 - 126 U/L 59 66 79  AST 15 - 41 U/L 33 29 27  ALT 0 - 44 U/L 33 22 23   Lab Results  Component Value Date   VD25OH 46.85 01/09/2021   VD25OH 26.05 (L) 10/01/2020   VD25OH 32.3 09/12/2020   Lab Results  Component Value Date   CEA1 6.0 (H) 01/09/2021   CEA1 6.7 (H) 10/01/2020   CEA1 6.9 (H) 06/26/2020    DIAGNOSTIC IMAGING:  I have independently reviewed the scans and discussed with the patient. CT CHEST LUNG CA SCREEN LOW DOSE W/O CM  Result Date: 12/26/2020 CLINICAL DATA:  65 year old female with greater than 30 pack-year history of smoking. Lung cancer screening. EXAM: CT CHEST WITHOUT CONTRAST LOW-DOSE FOR LUNG CANCER SCREENING TECHNIQUE: Multidetector CT imaging of the chest was performed following the standard protocol without IV contrast. COMPARISON:  12/05/2019 FINDINGS: Cardiovascular: The heart size is normal. No substantial pericardial effusion. Coronary artery calcification is evident. Atherosclerotic calcification is noted in the wall of the thoracic aorta. Left Port-A-Cath again noted. Mediastinum/Nodes: No mediastinal lymphadenopathy. No evidence for gross hilar lymphadenopathy although assessment is limited by the lack of intravenous contrast on today's study. The esophagus has normal imaging features. There is no axillary lymphadenopathy. Lungs/Pleura: Centrilobular emphsyema noted. Previously identified tiny pulmonary nodules are stable. No new suspicious nodule or mass. No focal airspace consolidation. No pleural effusion. Upper Abdomen: Unremarkable. Musculoskeletal: No worrisome lytic or sclerotic osseous abnormality. IMPRESSION: 1. Lung-RADS 2, benign appearance or behavior. Continue annual screening with low-dose chest CT without  contrast in 12 months. 2.  Emphysema (ICD10-J43.9) and Aortic Atherosclerosis (ICD10-170.0) Electronically Signed   By: Misty Stanley M.D.   On: 12/26/2020 08:24     ASSESSMENT:  1. Stage III (PT4BPN1A) transverse colon adenocarcinoma: -Right colectomy on 01/09/2019, 1/24 lymph nodes positive, no tumor deposits, no perineural invasion, grade 2, tumor extending through serosa into adherent omentum, margins negative. -PET scan on 01/31/2019 shows focal hypermetabolic activity identified in the ileocecal anastomosis with SUV 9. -12 cycles of FOLFOX from 03/01/2019 through 08/22/2019. -CTAP on 03/26/2019 did not show any evidence of recurrence or metastatic disease. -CTAP from 10/03/2020 shows no evidence of recurrence or metastatic disease.  2. 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 MSI was high by PCR. BRAF testing was negative. -MLH1 hyper methylation was present. Thought to be sporadic cancer.  3. Smoking history: -40+ pack year smoker. -CT low-dose on 12/05/2019 was lung RADS 2 with emphysema. -CT chest on 12/25/2020 was lung RADS 2.   PLAN:  1. Stage III (PT4BPN1A) transverse colon adenocarcinoma: -No change in bowel habits.  No bleeding per rectum or melena. -Reviewed labs from 01/09/2021.  CEA is slightly elevated at 6 but improved from previous value.  LFTs are normal except total bilirubin of 1.4. -Had colonoscopy on 10/16/2020 which did not show any major abnormalities. -RTC 3 months with repeat CEA, LFTs and CT scan of the abdomen and pelvis. -She reports no blood return during recent port flush.  We will plan for Cathflo at next flush.  2. Peripheral neuropathy: -Neuropathy in the hands improved.  Mild neuropathy in the feet from prior oxaliplatin is stable.  No medications necessary.  3. Vitamin D deficiency: -Vitamin D is 46.85.  Continue vitamin D supplements.  4. Smoking history: -Reviewed results of the  CT chest lung cancer screening scan which  showed lung RADS 2.  Repeat CT chest without contrast in 12 months.   Orders placed this encounter:  Orders Placed This Encounter  Procedures  . CT Abdomen Pelvis W Contrast  . CBC with Differential/Platelet  . Comprehensive metabolic panel  . CEA  . VITAMIN D 25 Hydroxy (Vit-D Deficiency, Fractures)     Derek Jack, MD [facility] [phone]   I, Milinda Antis, am acting as a scribe for Dr. Sanda Linger.  I, Derek Jack MD, have reviewed the above documentation for accuracy and completeness, and I agree with the above.

## 2021-01-16 NOTE — Patient Instructions (Signed)
[facility] at [facility] °Discharge Instructions ° °You were seen today by Dr. Katragadda. He went over your recent results. You will be scheduled for a CT scan of your abdomen before your next visit. Dr. Katragadda will see you back in 3 months for labs and follow up. ° ° °Thank you for choosing [facility] at [facility] to provide your oncology and hematology care.  To afford each patient quality time with our provider, please arrive at least 15 minutes before your scheduled appointment time.  ° °If you have a lab appointment with the Cancer Center please come in thru the Main Entrance and check in at the main information desk ° °You need to re-schedule your appointment should you arrive 10 or more minutes late.  We strive to give you quality time with our providers, and arriving late affects you and other patients whose appointments are after yours.  Also, if you no show three or more times for appointments you may be dismissed from the clinic at the providers discretion.     °Again, thank you for choosing [facility].  Our hope is that these requests will decrease the amount of time that you wait before being seen by our physicians.       °_____________________________________________________________ ° °Should you have questions after your visit to  Cancer Center, please contact our office at [phone] between the hours of 8:00 a.m. and 4:30 p.m.  Voicemails left after 4:00 p.m. will not be returned until the following business day.  For prescription refill requests, have your pharmacy contact our office and allow 72 hours.   ° °Cancer Center Support Programs:  ° °> Cancer Support Group  °2nd Tuesday of the month 1pm-2pm, Journey Room  ° ° °

## 2021-01-17 ENCOUNTER — Other Ambulatory Visit: Payer: Self-pay | Admitting: Family Medicine

## 2021-01-31 ENCOUNTER — Other Ambulatory Visit: Payer: Self-pay | Admitting: Family Medicine

## 2021-01-31 DIAGNOSIS — I10 Essential (primary) hypertension: Secondary | ICD-10-CM

## 2021-03-06 ENCOUNTER — Other Ambulatory Visit: Payer: Self-pay | Admitting: Family Medicine

## 2021-03-06 DIAGNOSIS — I10 Essential (primary) hypertension: Secondary | ICD-10-CM

## 2021-04-02 ENCOUNTER — Other Ambulatory Visit: Payer: Self-pay | Admitting: Family Medicine

## 2021-04-02 DIAGNOSIS — I10 Essential (primary) hypertension: Secondary | ICD-10-CM

## 2021-04-15 ENCOUNTER — Inpatient Hospital Stay (HOSPITAL_COMMUNITY): Payer: 59 | Attending: Hematology

## 2021-04-15 ENCOUNTER — Ambulatory Visit (HOSPITAL_COMMUNITY)
Admission: RE | Admit: 2021-04-15 | Discharge: 2021-04-15 | Disposition: A | Discharge status: Home / Self Care | Payer: 59 | Source: Ambulatory Visit | Attending: Hematology | Admitting: Hematology

## 2021-04-15 ENCOUNTER — Other Ambulatory Visit: Payer: Self-pay

## 2021-04-15 DIAGNOSIS — C184 Malignant neoplasm of transverse colon: Secondary | ICD-10-CM | POA: Insufficient documentation

## 2021-04-15 LAB — CBC WITH DIFFERENTIAL/PLATELET
Abs Immature Granulocytes: 0.04 10*3/uL (ref 0.00–0.07)
Basophils Absolute: 0 10*3/uL (ref 0.0–0.1)
Basophils Relative: 1 %
Eosinophils Absolute: 0 10*3/uL (ref 0.0–0.5)
Eosinophils Relative: 1 %
HCT: 37.1 % (ref 36.0–46.0)
Hemoglobin: 13.4 g/dL (ref 12.0–15.0)
Immature Granulocytes: 1 %
Lymphocytes Relative: 42 %
Lymphs Abs: 2.6 10*3/uL (ref 0.7–4.0)
MCH: 39.2 pg — ABNORMAL HIGH (ref 26.0–34.0)
MCHC: 36.1 g/dL — ABNORMAL HIGH (ref 30.0–36.0)
MCV: 108.5 fL — ABNORMAL HIGH (ref 80.0–100.0)
Monocytes Absolute: 0.4 10*3/uL (ref 0.1–1.0)
Monocytes Relative: 6 %
Neutro Abs: 3.1 10*3/uL (ref 1.7–7.7)
Neutrophils Relative %: 49 %
Platelets: 180 10*3/uL (ref 150–400)
RBC: 3.42 MIL/uL — ABNORMAL LOW (ref 3.87–5.11)
RDW: 14.2 % (ref 11.5–15.5)
WBC: 6.2 10*3/uL (ref 4.0–10.5)
nRBC: 0 % (ref 0.0–0.2)

## 2021-04-15 LAB — COMPREHENSIVE METABOLIC PANEL
ALT: 51 U/L — ABNORMAL HIGH (ref 0–44)
AST: 52 U/L — ABNORMAL HIGH (ref 15–41)
Albumin: 4.5 g/dL (ref 3.5–5.0)
Alkaline Phosphatase: 68 U/L (ref 38–126)
Anion gap: 9 (ref 5–15)
BUN: 8 mg/dL (ref 8–23)
CO2: 25 mmol/L (ref 22–32)
Calcium: 9.7 mg/dL (ref 8.9–10.3)
Chloride: 103 mmol/L (ref 98–111)
Creatinine, Ser: 0.72 mg/dL (ref 0.44–1.00)
GFR, Estimated: >60 mL/min (ref >60)
Glucose, Bld: 166 mg/dL — ABNORMAL HIGH (ref 70–99)
Potassium: 3.7 mmol/L (ref 3.5–5.1)
Sodium: 137 mmol/L (ref 135–145)
Total Bilirubin: 1.6 mg/dL — ABNORMAL HIGH (ref 0.3–1.2)
Total Protein: 7.3 g/dL (ref 6.5–8.1)

## 2021-04-15 LAB — VITAMIN D 25 HYDROXY (VIT D DEFICIENCY, FRACTURES): Vit D, 25-Hydroxy: 40.08 ng/mL (ref 30–100)

## 2021-04-15 MED ORDER — IOHEXOL 300 MG/ML  SOLN
100.0000 mL | Freq: Once | INTRAMUSCULAR | Status: AC | PRN
  Administered 2021-04-15: 100 mL via INTRAVENOUS

## 2021-04-16 LAB — CEA: CEA: 6.2 ng/mL — ABNORMAL HIGH (ref 0.0–4.7)

## 2021-04-21 ENCOUNTER — Other Ambulatory Visit: Payer: Self-pay

## 2021-04-21 ENCOUNTER — Inpatient Hospital Stay (HOSPITAL_COMMUNITY): Payer: 59

## 2021-04-21 ENCOUNTER — Inpatient Hospital Stay (HOSPITAL_COMMUNITY): Payer: 59 | Attending: Hematology | Admitting: Hematology

## 2021-04-21 VITALS — BP 130/80 | HR 87 | Temp 97.0°F | Resp 18 | Wt 151.2 lb

## 2021-04-21 DIAGNOSIS — K8020 Calculus of gallbladder without cholecystitis without obstruction: Secondary | ICD-10-CM | POA: Diagnosis not present

## 2021-04-21 DIAGNOSIS — R5383 Other fatigue: Secondary | ICD-10-CM | POA: Diagnosis not present

## 2021-04-21 DIAGNOSIS — E559 Vitamin D deficiency, unspecified: Secondary | ICD-10-CM | POA: Diagnosis not present

## 2021-04-21 DIAGNOSIS — G629 Polyneuropathy, unspecified: Secondary | ICD-10-CM | POA: Insufficient documentation

## 2021-04-21 DIAGNOSIS — K760 Fatty (change of) liver, not elsewhere classified: Secondary | ICD-10-CM | POA: Insufficient documentation

## 2021-04-21 DIAGNOSIS — Z452 Encounter for adjustment and management of vascular access device: Secondary | ICD-10-CM | POA: Insufficient documentation

## 2021-04-21 DIAGNOSIS — Z79899 Other long term (current) drug therapy: Secondary | ICD-10-CM | POA: Insufficient documentation

## 2021-04-21 DIAGNOSIS — F17210 Nicotine dependence, cigarettes, uncomplicated: Secondary | ICD-10-CM | POA: Diagnosis not present

## 2021-04-21 DIAGNOSIS — I700 Atherosclerosis of aorta: Secondary | ICD-10-CM | POA: Insufficient documentation

## 2021-04-21 DIAGNOSIS — Z8349 Family history of other endocrine, nutritional and metabolic diseases: Secondary | ICD-10-CM | POA: Diagnosis not present

## 2021-04-21 DIAGNOSIS — C184 Malignant neoplasm of transverse colon: Secondary | ICD-10-CM

## 2021-04-21 DIAGNOSIS — Z801 Family history of malignant neoplasm of trachea, bronchus and lung: Secondary | ICD-10-CM | POA: Insufficient documentation

## 2021-04-21 DIAGNOSIS — Z809 Family history of malignant neoplasm, unspecified: Secondary | ICD-10-CM | POA: Diagnosis not present

## 2021-04-21 DIAGNOSIS — Z8249 Family history of ischemic heart disease and other diseases of the circulatory system: Secondary | ICD-10-CM | POA: Diagnosis not present

## 2021-04-21 DIAGNOSIS — E785 Hyperlipidemia, unspecified: Secondary | ICD-10-CM | POA: Diagnosis not present

## 2021-04-21 DIAGNOSIS — D259 Leiomyoma of uterus, unspecified: Secondary | ICD-10-CM | POA: Insufficient documentation

## 2021-04-21 MED ORDER — SODIUM CHLORIDE 0.9% FLUSH
20.0000 mL | Freq: Once | INTRAVENOUS | Status: AC
  Administered 2021-04-21: 20 mL via INTRAVENOUS

## 2021-04-21 MED ORDER — HEPARIN SOD (PORK) LOCK FLUSH 100 UNIT/ML IV SOLN
500.0000 [IU] | Freq: Once | INTRAVENOUS | Status: AC
  Administered 2021-04-21: 500 [IU] via INTRAVENOUS

## 2021-04-21 NOTE — Patient Instructions (Signed)
[facility] at [facility] Discharge Instructions  You were seen today by Dr. Katragadda. He went over your recent results and scans. You will be getting your scans every 6 months. Dr. Katragadda will see you back in 3 months for labs and follow up.   Thank you for choosing [facility] at [facility] to provide your oncology and hematology care.  To afford each patient quality time with our provider, please arrive at least 15 minutes before your scheduled appointment time.   If you have a lab appointment with the Cancer Center please come in thru the Main Entrance and check in at the main information desk  You need to re-schedule your appointment should you arrive 10 or more minutes late.  We strive to give you quality time with our providers, and arriving late affects you and other patients whose appointments are after yours.  Also, if you no show three or more times for appointments you may be dismissed from the clinic at the providers discretion.     Again, thank you for choosing [facility].  Our hope is that these requests will decrease the amount of time that you wait before being seen by our physicians.       _____________________________________________________________  Should you have questions after your visit to  Cancer Center, please contact our office at [phone] between the hours of 8:00 a.m. and 4:30 p.m.  Voicemails left after 4:00 p.m. will not be returned until the following business day.  For prescription refill requests, have your pharmacy contact our office and allow 72 hours.    Cancer Center Support Programs:   > Cancer Support Group  2nd Tuesday of the month 1pm-2pm, Journey Room    

## 2021-04-21 NOTE — Progress Notes (Signed)
Andrea Simon, [place] [id number]   CLINIC:  Medical Oncology/Hematology  PCP:  Fayrene Helper, MD [address], [address] / [place] [address] [phone]   REASON FOR VISIT:  Follow-up for stage III transverse colon adenocarcinoma  PRIOR THERAPY:  1. Laparoscopic right colectomy on 01/09/2019. 2. FOLFOX x 12 cycles from 03/01/2019 to 08/22/2019.  NGS Results: Not done  CURRENT THERAPY: Surveillance  BRIEF ONCOLOGIC HISTORY:  Oncology History  Colon cancer ([place])  01/09/2019 Initial Diagnosis   Colon cancer ([place])   03/01/2019 -  Chemotherapy   The patient had palonosetron (ALOXI) injection 0.25 mg, 0.25 mg, Intravenous,  Once, 12 of 12 cycles Administration: 0.25 mg (03/01/2019), 0.25 mg (03/15/2019), 0.25 mg (03/27/2019), 0.25 mg (04/10/2019), 0.25 mg (05/09/2019), 0.25 mg (05/23/2019), 0.25 mg (06/06/2019), 0.25 mg (06/20/2019), 0.25 mg (07/25/2019), 0.25 mg (07/11/2019), 0.25 mg (08/08/2019), 0.25 mg (08/22/2019) pegfilgrastim (NEULASTA) injection 6 mg, 6 mg, Subcutaneous, Once, 8 of 8 cycles Administration: 6 mg (05/11/2019), 6 mg (05/25/2019), 6 mg (06/08/2019), 6 mg (06/22/2019), 6 mg (07/13/2019), 6 mg (07/27/2019), 6 mg (08/10/2019), 6 mg (08/24/2019) leucovorin 600 mg in dextrose 5 % 250 mL infusion, 640 mg, Intravenous,  Once, 12 of 12 cycles Administration: 600 mg (03/01/2019), 600 mg (03/15/2019), 640 mg (03/27/2019), 640 mg (04/10/2019), 640 mg (05/09/2019), 640 mg (05/23/2019), 640 mg (06/06/2019), 640 mg (06/20/2019), 640 mg (07/25/2019), 640 mg (07/11/2019), 640 mg (08/08/2019), 640 mg (08/22/2019) oxaliplatin (ELOXATIN) 135 mg in dextrose 5 % 500 mL chemo infusion, 85 mg/m2 = 135 mg, Intravenous,  Once, 10 of 10 cycles Dose modification: 68 mg/m2 (80 % of original dose 85 mg/m2, Cycle 5, Reason: Provider Judgment), 68 mg/m2 (original dose 85 mg/m2, Cycle 6, Reason: Provider Judgment), 51 mg/m2 (60 % of original dose 85 mg/m2, Cycle 10, Reason: Other (see comments),  Comment: neuropathy) Administration: 135 mg (03/01/2019), 135 mg (03/15/2019), 135 mg (03/27/2019), 135 mg (04/10/2019), 110 mg (05/09/2019), 110 mg (05/23/2019), 110 mg (06/06/2019), 110 mg (06/20/2019), 80 mg (07/25/2019), 110 mg (07/11/2019) fluorouracil (ADRUCIL) chemo injection 650 mg, 400 mg/m2 = 650 mg, Intravenous,  Once, 12 of 12 cycles Administration: 650 mg (03/01/2019), 650 mg (03/15/2019), 650 mg (03/27/2019), 650 mg (04/10/2019), 650 mg (05/09/2019), 650 mg (05/23/2019), 650 mg (06/06/2019), 650 mg (06/20/2019), 650 mg (07/25/2019), 650 mg (07/11/2019), 650 mg (08/08/2019), 650 mg (08/22/2019) fluorouracil (ADRUCIL) 3,850 mg in sodium chloride 0.9 % 73 mL chemo infusion, 2,400 mg/m2 = 3,850 mg, Intravenous, 1 Day/Dose, 12 of 12 cycles Administration: 3,850 mg (03/01/2019), 3,850 mg (03/15/2019), 3,850 mg (03/27/2019), 3,850 mg (04/10/2019), 3,850 mg (05/09/2019), 3,850 mg (05/23/2019), 3,850 mg (06/06/2019), 3,850 mg (06/20/2019), 3,850 mg (07/25/2019), 3,850 mg (07/11/2019), 3,850 mg (08/08/2019), 3,850 mg (08/22/2019)  for chemotherapy treatment.      CANCER STAGING: Cancer Staging No matching staging information was found for the patient.  INTERVAL HISTORY:  [name], a 65 y.o. female, returns for routine follow-up of her stage III transverse colon adenocarcinoma. Andrea Simon was last seen on 01/16/2021.   Today she reports feeling well. She denies having any hematochezia, melena or changes in her BM's. She continues having intermittent numbness in her feet, but denies tingling or pain.   REVIEW OF SYSTEMS:  Review of Systems  Constitutional: Positive for fatigue (50%). Negative for appetite change.  Gastrointestinal: Negative for blood in stool, constipation and diarrhea.  Neurological: Positive for numbness (intermittent in feet).  All other systems reviewed and are negative.   PAST  MEDICAL/SURGICAL HISTORY:  Past Medical History:  Diagnosis Date  . Cancer ([place]) 11/2018   Phreesia 09/16/2020  .  Hyperlipidemia   . Insomnia due to anxiety and fear 01/27/2019  . Multinodular goiter   . Prediabetes    Past Surgical History:  Procedure Laterality Date  . BIOPSY  12/07/2018   Procedure: BIOPSY;  Surgeon: Rogene Houston, MD;  [facility]: [facility];  Service: Endoscopy;;  sigmoid colon  . COLON RESECTION N/A 01/09/2019   Procedure: LAPAROSCOPIC RIGHT HEMICOLECTOMY;  Surgeon: Virl Cagey, MD;  Location: [facility] ORS;  Service: General;  Laterality: N/A;  . COLON SURGERY N/A    Phreesia 09/16/2020  . COLONOSCOPY N/A 12/07/2018   Procedure: COLONOSCOPY;  Surgeon: Rogene Houston, MD;  Location: [facility];  Service: Endoscopy;  Laterality: N/A;  12:45  . COLONOSCOPY WITH PROPOFOL N/A 10/16/2020   Procedure: COLONOSCOPY WITH PROPOFOL;  Surgeon: Rogene Houston, MD;  Location: [facility];  Service: Endoscopy;  Laterality: N/A;  955  . DILATION AND CURETTAGE OF UTERUS  2006  . FNA thyroid  2011 and 2019   benign  . POLYPECTOMY  12/07/2018   Procedure: POLYPECTOMY;  Surgeon: Rogene Houston, MD;  [facility]: [facility];  Service: Endoscopy;;  splenic flexure (CS x1)  . POLYPECTOMY  10/16/2020   Procedure: POLYPECTOMY;  Surgeon: Rogene Houston, MD;  Location: [facility];  Service: Endoscopy;;  . PORTACATH PLACEMENT Left 02/15/2019   Procedure: INSERTION PORT-A-CATH;  Surgeon: Virl Cagey, MD;  Location: [facility] OR[facility];  Service: General;  Laterality: Left;  . TUBAL LIGATION  1992    SOCIAL HISTORY:  Social History   Socioeconomic History  . Marital status: Married    Spouse name: Not on file  . Number of children: 1  . Years of education: Not on file  . Highest education level: Not on file  Occupational History  . Occupation: Theme park manager - [employer]   Tobacco Use  . Smoking status: Current Every Day Smoker    Packs/day: 0.75    Years: 45.00    Pack years: 33.75    Types: Cigarettes  . Smokeless tobacco: Never Used  Vaping Use  . Vaping Use: Never used   Substance and Sexual Activity  . Alcohol use: Yes    Alcohol/week: 0.0 standard drinks    Comment: occasionally  . Drug use: No  . Sexual activity: Yes  Other Topics Concern  . Not on file  Social History Narrative  . Not on file   Social Determinants of Health   Financial Resource Strain: Not on file  Food Insecurity: Not on file  Transportation Needs: Not on file  Physical Activity: Not on file  Stress: Not on file  Social Connections: Not on file  Intimate Partner Violence: Not on file    FAMILY HISTORY:  Family History  Problem Relation Age of Onset  . Hypertension Mother        AAA  . Coronary artery disease Mother   . Hyperlipidemia Mother   . Cancer Mother        lung  . Hypertension Father   . Cancer Brother     CURRENT MEDICATIONS:  Current Outpatient Medications  Medication Sig Dispense Refill  . acetaminophen (TYLENOL) 500 MG tablet Take 1,000 mg by mouth every 6 (six) hours as needed for moderate pain.     Marland Kitchen amLODipine (NORVASC) 2.5 MG tablet Take 1 tablet by mouth once daily 30 tablet 0  .  aspirin EC 81 MG tablet Take 1 tablet (81 mg total) by mouth every evening. 30 tablet 11  . buPROPion (WELLBUTRIN XL) 150 MG 24 hr tablet Take 1 tablet by mouth once daily 30 tablet 0  . Calcium Carbonate-Vitamin D 600-400 MG-UNIT tablet Take 1 tablet by mouth daily.     Marland Kitchen lidocaine-prilocaine (EMLA) cream Apply 1 application topically daily as needed (prior to port flush).    . rosuvastatin (CRESTOR) 10 MG tablet Take 1 tablet (10 mg total) by mouth daily. 90 tablet 3   No current facility-administered medications for this visit.   Facility-Administered Medications Ordered in Other Visits  Medication Dose Route Frequency Provider Last Rate Last Admin  . sodium chloride flush (NS) 0.9 % injection 10 mL  10 mL Intracatheter PRN Derek Jack, MD   10 mL at 04/26/19 0928  . sodium chloride flush (NS) 0.9 % injection 10 mL  10 mL Intravenous PRN Derek Jack, MD   10 mL at 01/09/21 1430    ALLERGIES:  No Known Allergies  PHYSICAL EXAM:  Performance status (ECOG): 1 - Symptomatic but completely ambulatory  Vitals:   04/21/21 1410  BP: 130/80  Pulse: 87  Resp: 18  Temp: (!) 97 F (36.1 C)  SpO2: 96%   Wt Readings from Last 3 Encounters:  04/21/21 151 lb 3.2 oz (68.6 kg)  01/16/21 147 lb 1.6 oz (66.7 kg)  11/11/20 141 lb (64 kg)   Physical Exam Vitals reviewed.  Constitutional:      Appearance: Normal appearance.  Cardiovascular:     Rate and Rhythm: Normal rate and regular rhythm.     Pulses: Normal pulses.     Heart sounds: Normal heart sounds.  Pulmonary:     Effort: Pulmonary effort is normal.     Breath sounds: Normal breath sounds.  Abdominal:     Palpations: Abdomen is soft. There is no hepatomegaly, splenomegaly or mass.     Tenderness: There is no abdominal tenderness.     Hernia: No hernia is present.  Musculoskeletal:     Right lower leg: No edema.     Left lower leg: No edema.  Neurological:     General: No focal deficit present.     Mental Status: She is alert and oriented to person, place, and time.  Psychiatric:        Mood and Affect: Mood normal.        Behavior: Behavior normal.      LABORATORY DATA:  I have reviewed the labs as listed.  CBC Latest Ref Rng & Units 04/15/2021 01/09/2021 10/01/2020  WBC 4.0 - 10.5 K/uL 6.2 6.0 5.7  Hemoglobin 12.0 - 15.0 g/dL 13.4 13.1 12.8  Hematocrit 36.0 - 46.0 % 37.1 37.4 34.9(L)  Platelets 150 - 400 K/uL 180 244 170   CMP Latest Ref Rng & Units 04/15/2021 01/09/2021 10/01/2020  Glucose 70 - 99 mg/dL 166(H) 101(H) 160(H)  BUN 8 - 23 mg/dL 8 11 6(L)  Creatinine 0.44 - 1.00 mg/dL 0.72 0.83 0.78  Sodium 135 - 145 mmol/L 137 138 136  Potassium 3.5 - 5.1 mmol/L 3.7 3.4(L) 3.2(L)  Chloride 98 - 111 mmol/L 103 102 100  CO2 22 - 32 mmol/L _0 Calcium 8.9 - 10.3 mg/dL 9.7 9.7 9.5  Total Protein 6.5 - 8.1 g/dL 7.3 7.6 7.4  Total Bilirubin 0.3 - 1.2  mg/dL 1.6(H) 1.4(H) 1.7(H)  Alkaline Phos 38 - 126 U/L 68 59 66  AST 15 -  41 U/L 52(H) 33 29  ALT 0 - 44 U/L 51(H) 33 22   Lab Results  Component Value Date   VD25OH 40.08 04/15/2021   VD25OH 46.85 01/09/2021   VD25OH 26.05 (L) 10/01/2020   Lab Results  Component Value Date   CEA1 6.2 (H) 04/15/2021   CEA1 6.0 (H) 01/09/2021   CEA1 6.7 (H) 10/01/2020    DIAGNOSTIC IMAGING:  I have independently reviewed the scans and discussed with the patient. CT Abdomen Pelvis W Contrast  Result Date: 04/15/2021 CLINICAL DATA:  Transverse colon cancer, status post resection, chemotherapy complete EXAM: CT ABDOMEN AND PELVIS WITH CONTRAST TECHNIQUE: Multidetector CT imaging of the abdomen and pelvis was performed using the standard protocol following bolus administration of intravenous contrast. CONTRAST:  158m OMNIPAQUE IOHEXOL 300 MG/ML SOLN, additional oral enteric contrast COMPARISON:  10/03/2020 FINDINGS: Lower chest: No acute abnormality. Hepatobiliary: No solid liver abnormality is seen. Hepatic steatosis. Gallstone in the gallbladder. No gallbladder wall thickening, or biliary dilatation. Pancreas: Unremarkable. No pancreatic ductal dilatation or surrounding inflammatory changes. Spleen: Normal in size without significant abnormality. Adrenals/Urinary Tract: Adrenal glands are unremarkable. Kidneys are normal, without renal calculi, solid lesion, or hydronephrosis. Bladder is unremarkable. Stomach/Bowel: Stomach is within normal limits. Redemonstrated postoperative findings of right hemicolectomy and reanastomosis. No evidence of bowel wall thickening, distention, or inflammatory changes. Vascular/Lymphatic: Aortic atherosclerosis. No enlarged abdominal or pelvic lymph nodes. Reproductive: Uterine fibroids. Other: No abdominal wall hernia or abnormality. No abdominopelvic ascites. Musculoskeletal: No acute or significant osseous findings. IMPRESSION: 1. Redemonstrated postoperative findings of right  hemicolectomy and reanastomosis. No evidence of recurrent or metastatic disease in the abdomen or pelvis. 2. Hepatic steatosis. 3. Cholelithiasis. 4. Uterine fibroids. Aortic Atherosclerosis (ICD10-I70.0). Electronically Signed   By: AEddie CandleM.D.   On: 04/15/2021 15:10     ASSESSMENT:  1. Stage III (PT4BPN1A) transverse colon adenocarcinoma: -Right colectomy on 01/09/2019, 1/24 lymph nodes positive, no tumor deposits, no perineural invasion, grade 2, tumor extending through serosa into adherent omentum, margins negative. -PET scan on 01/31/2019 shows focal hypermetabolic activity identified in the ileocecal anastomosis with SUV 9. -12 cycles of FOLFOX from 03/01/2019 through 08/22/2019. -CTAP on 03/26/2019 did not show any evidence of recurrence or metastatic disease. -CTAP from 10/03/2020 shows no evidence of recurrence or metastatic disease.  2. 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 MSI was high by PCR. BRAF testing was negative. -MLH1 hyper methylation was present. Thought to be sporadic cancer.  3. Smoking history: -40+ pack year smoker. -CT low-dose on 12/05/2019 was lung RADS 2 with emphysema. -CT chest on 12/25/2020 was lung RADS 2.   PLAN:  1. Stage III (PT4BPN1A) transverse colon adenocarcinoma: -Last colonoscopy on 10/16/2020 which did not show any major abnormalities. - She denies any change in bowel habits or bleeding per rectum. - Reviewed CTAP from 04/15/2021 which showed postoperative findings with no evidence of recurrence or metastatic disease.  Hepatic steatosis. - Reviewed labs from 04/15/2021.  CEA was slightly elevated at 6.2 and more or less stable. - Her AST and ALT were elevated at 52 and 51 respectively.  This is likely from fatty liver changes.  No new medications reported.  We will plan to repeat labs including CEA and LFTs in 3 months. - We will plan to repeat another CT scan prior to end of this year.  2. Peripheral neuropathy: -Mild  neuropathy in the feet from prior oxaliplatin is stable.  No neuropathic pains.  3. Vitamin D deficiency: -Continue  vitamin D supplements.  Vitamin D level is 40.08.  4. Smoking history: -Last CT chest was on 12/25/2020.  She will require another scan in January 2023.   Orders placed this encounter:  Orders Placed This Encounter  Procedures  . CBC with Differential/Platelet  . Comprehensive metabolic panel  . CEA     Derek Jack, MD [place] [phone]   I, Milinda Antis, am acting as a scribe for Dr. Sanda Linger.  I, Derek Jack MD, have reviewed the above documentation for accuracy and completeness, and I agree with the above.

## 2021-04-21 NOTE — Progress Notes (Signed)
Andrea Simon presents today for portacath flush. VSS.  Left upper chest port accessed and port flushed per protocol. See MAR and IV assessment for details. Port deaccessed and bandaid applied. Site clean and intact. Discharged in satisfactory condition with follow up instructions.

## 2021-04-21 NOTE — Patient Instructions (Signed)

## 2021-05-05 ENCOUNTER — Other Ambulatory Visit: Payer: Self-pay | Admitting: Family Medicine

## 2021-05-05 DIAGNOSIS — I10 Essential (primary) hypertension: Secondary | ICD-10-CM

## 2021-06-01 ENCOUNTER — Other Ambulatory Visit: Payer: Self-pay | Admitting: Family Medicine

## 2021-06-01 DIAGNOSIS — I10 Essential (primary) hypertension: Secondary | ICD-10-CM

## 2021-06-02 ENCOUNTER — Other Ambulatory Visit: Payer: Self-pay | Admitting: Family Medicine

## 2021-06-02 DIAGNOSIS — I10 Essential (primary) hypertension: Secondary | ICD-10-CM

## 2021-06-11 ENCOUNTER — Ambulatory Visit: Payer: 59 | Admitting: Family Medicine

## 2021-07-01 ENCOUNTER — Other Ambulatory Visit: Payer: Self-pay | Admitting: Family Medicine

## 2021-07-01 DIAGNOSIS — I10 Essential (primary) hypertension: Secondary | ICD-10-CM

## 2021-07-11 ENCOUNTER — Other Ambulatory Visit: Payer: Self-pay

## 2021-07-11 ENCOUNTER — Encounter: Payer: Self-pay | Admitting: Family Medicine

## 2021-07-11 ENCOUNTER — Ambulatory Visit (INDEPENDENT_AMBULATORY_CARE_PROVIDER_SITE_OTHER): Payer: 59 | Admitting: Family Medicine

## 2021-07-11 VITALS — BP 143/80 | HR 83 | Temp 98.8°F | Resp 20 | Ht 63.0 in | Wt 146.0 lb

## 2021-07-11 DIAGNOSIS — E041 Nontoxic single thyroid nodule: Secondary | ICD-10-CM

## 2021-07-11 DIAGNOSIS — F17200 Nicotine dependence, unspecified, uncomplicated: Secondary | ICD-10-CM | POA: Diagnosis not present

## 2021-07-11 DIAGNOSIS — E782 Mixed hyperlipidemia: Secondary | ICD-10-CM

## 2021-07-11 DIAGNOSIS — C184 Malignant neoplasm of transverse colon: Secondary | ICD-10-CM

## 2021-07-11 DIAGNOSIS — E559 Vitamin D deficiency, unspecified: Secondary | ICD-10-CM

## 2021-07-11 DIAGNOSIS — F321 Major depressive disorder, single episode, moderate: Secondary | ICD-10-CM

## 2021-07-11 DIAGNOSIS — Z23 Encounter for immunization: Secondary | ICD-10-CM

## 2021-07-11 DIAGNOSIS — I10 Essential (primary) hypertension: Secondary | ICD-10-CM | POA: Diagnosis not present

## 2021-07-11 DIAGNOSIS — G629 Polyneuropathy, unspecified: Secondary | ICD-10-CM

## 2021-07-11 DIAGNOSIS — R7303 Prediabetes: Secondary | ICD-10-CM

## 2021-07-11 DIAGNOSIS — T733XXA Exhaustion due to excessive exertion, initial encounter: Secondary | ICD-10-CM

## 2021-07-11 DIAGNOSIS — R7301 Impaired fasting glucose: Secondary | ICD-10-CM

## 2021-07-11 NOTE — Patient Instructions (Addendum)
Annual exam in October , call if you need me sooner    Info on shingles will be given to you  Please get fasting cBC, lipid, cmp and eGFr,hBA1c, TSH and vit D 1 week before next appointment  Now down to 7 ciggs/ day, keep working on it, congrats on reduction  It is important that you exercise regularly at least 30 minutes 5 times a week. If you develop chest pain, have severe difficulty breathing, or feel very tired, stop exercising immediately and seek medical attention   Blood pressure slightly elevated at visit  Thanks for choosing [facility], we consider it a privelige to serve you.

## 2021-07-13 ENCOUNTER — Encounter: Payer: Self-pay | Admitting: Family Medicine

## 2021-07-13 NOTE — Assessment & Plan Note (Signed)
Asked:confirms currently smokes cigarettes, 7/ day Assess: Unwilling to set a quit date, but is cutting back Advise: needs to QUIT to reduce risk of cancer, cardio and cerebrovascular disease Assist: counseled for 5 minutes and literature provided Arrange: follow up in 2 to 4 months

## 2021-07-13 NOTE — Progress Notes (Signed)
Andrea Simon     MRN: [id number]      DOB: [birth year]   HPI Andrea Simon is here for follow up and re-evaluation of chronic medical conditions, medication management and review of any available recent lab and radiology data.  Preventive health is updated, specifically  Cancer screening and Immunization.   Questions or concerns regarding consultations or procedures which the PT has had in the interim are  addressed. The PT denies any adverse reactions to current medications since the last visit.  There are no new concerns.  There are no specific complaints   ROS Denies recent fever or chills. Denies sinus pressure, nasal congestion, ear pain or sore throat. Denies chest congestion, productive cough or wheezing. Denies chest pains, palpitations and leg swelling Denies abdominal pain, nausea, vomiting,diarrhea or constipation.   Denies dysuria, frequency, hesitancy or incontinence. Denies joint pain, swelling and limitation in mobility. Denies headaches, seizures,  Denies depression, anxiety or insomnia. Denies skin break down or rash.   PE  BP (!) 143/80 (BP Location: Right Arm, Patient Position: Sitting, Cuff Size: Normal)   Pulse 83   Temp 98.8 F (37.1 C)   Resp 20   Ht '5\' 3"'$  (1.6 m)   Wt 146 lb (66.2 kg)   SpO2 97%   BMI 25.86 kg/m   Patient alert and oriented and in no cardiopulmonary distress.  HEENT: No facial asymmetry, EOMI,     Neck supple .  Chest: Clear to auscultation bilaterally.  CVS: S1, S2 no murmurs, no S3.Regular rate.  ABD: Soft non tender.   Ext: No edema  MS: Adequate ROM spine, shoulders, hips and knees.  Skin: Intact, no ulcerations or rash noted.  Psych: Good eye contact, normal affect. Memory intact not anxious or depressed appearing.  CNS: CN 2-12 intact, power,  normal throughout.no focal deficits noted.   Assessment & Plan Essential hypertension Elevated at visit, but generally Controlled, no change in medication DASH  diet and commitment to daily physical activity for a minimum of 30 minutes discussed and encouraged, as a part of hypertension management. The importance of attaining a healthy weight is also discussed.  BP/Weight 07/11/2021 04/21/2021 01/16/2021 10/16/2020 10/10/2020 10/09/2020 [id number]  Systolic BP [id number] [id number] [id number]  Diastolic BP 80 80 72 86 80 74 84  Wt. (Lbs) 146 151.2 147.1 139.99 138.6 141 -  BMI 25.86 26.78 26.06 24.8 24.55 24.98 -       Hyperlipemia Hyperlipidemia:Low fat diet discussed and encouraged.   Lipid Panel  Lab Results  Component Value Date   CHOL 187 09/12/2020   HDL 36 (L) 09/12/2020   LDLCALC 122 (H) 09/12/2020   TRIG 161 (H) 09/12/2020   CHOLHDL 5.2 (H) 09/12/2020     Updated lab needed at/ before next visit.   NICOTINE ADDICTION Asked:confirms currently smokes cigarettes, 7/ day Assess: Unwilling to set a quit date, but is cutting back Advise: needs to QUIT to reduce risk of cancer, cardio and cerebrovascular disease Assist: counseled for 5 minutes and literature provided Arrange: follow up in 2 to 4 months   Prediabetes Patient educated about the importance of limiting  Carbohydrate intake , the need to commit to daily physical activity for a minimum of 30 minutes , and to commit weight loss. The fact that changes in all these areas will reduce or eliminate all together the development of diabetes is stressed.  Updated lab needed at/ before next visit.   Diabetic Labs Latest Ref Rng &  Units 04/15/2021 01/09/2021 10/01/2020 09/12/2020 07/03/2020  HbA1c 4.0 - 5.6 % - - - - 5.7(A)  Microalbumin Not Estab. ug/mL - - - - -  Micro/Creat Ratio 0.0 - 30.0 mg/g creat - - - - -  Chol 100 - 199 mg/dL - - - 187 -  HDL >39 mg/dL - - - 36(L) -  Calc LDL 0 - 99 mg/dL - - - 122(H) -  Triglycerides 0 - 149 mg/dL - - - 161(H) -  Creatinine 0.44 - 1.00 mg/dL 0.72 0.83 0.78 0.81 -   BP/Weight 07/11/2021 04/21/2021 01/16/2021 10/16/2020 10/10/2020 10/09/2020  [id number]  Systolic BP A999333 [id number]  Diastolic BP 80 80 72 86 80 74 84  Wt. (Lbs) 146 151.2 147.1 139.99 138.6 141 -  BMI 25.86 26.78 26.06 24.8 24.55 24.98 -   Foot/eye exam completion dates Latest Ref Rng & Units 11/08/2018 08/18/2016  Eye Exam No Retinopathy No Retinopathy -  Foot Form Completion - - Done      Neuropathy Persists ,  But less severe  Fatigue due to excessive exertion improved  Colon cancer (HCC) Followed closely by Oncology

## 2021-07-13 NOTE — Assessment & Plan Note (Signed)
improved

## 2021-07-13 NOTE — Assessment & Plan Note (Signed)
Patient educated about the importance of limiting  Carbohydrate intake , the need to commit to daily physical activity for a minimum of 30 minutes , and to commit weight loss. The fact that changes in all these areas will reduce or eliminate all together the development of diabetes is stressed.  Updated lab needed at/ before next visit.   Diabetic Labs Latest Ref Rng & Units 04/15/2021 01/09/2021 10/01/2020 09/12/2020 07/03/2020  HbA1c 4.0 - 5.6 % - - - - 5.7(A)  Microalbumin Not Estab. ug/mL - - - - -  Micro/Creat Ratio 0.0 - 30.0 mg/g creat - - - - -  Chol 100 - 199 mg/dL - - - 187 -  HDL >39 mg/dL - - - 36(L) -  Calc LDL 0 - 99 mg/dL - - - 122(H) -  Triglycerides 0 - 149 mg/dL - - - 161(H) -  Creatinine 0.44 - 1.00 mg/dL 0.72 0.83 0.78 0.81 -   BP/Weight 07/11/2021 04/21/2021 01/16/2021 10/16/2020 10/10/2020 10/09/2020 [id number]  Systolic BP A999333 [id number] [id number] 123456 [id number]  Diastolic BP 80 80 72 86 80 74 84  Wt. (Lbs) 146 151.2 147.1 139.99 138.6 141 -  BMI 25.86 26.78 26.06 24.8 24.55 24.98 -   Foot/eye exam completion dates Latest Ref Rng & Units 11/08/2018 08/18/2016  Eye Exam No Retinopathy No Retinopathy -  Foot Form Completion - - Done

## 2021-07-13 NOTE — Assessment & Plan Note (Signed)
Followed closely by Oncology 

## 2021-07-13 NOTE — Assessment & Plan Note (Signed)
Hyperlipidemia:Low fat diet discussed and encouraged.   Lipid Panel  Lab Results  Component Value Date   CHOL 187 09/12/2020   HDL 36 (L) 09/12/2020   LDLCALC 122 (H) 09/12/2020   TRIG 161 (H) 09/12/2020   CHOLHDL 5.2 (H) 09/12/2020     Updated lab needed at/ before next visit.

## 2021-07-13 NOTE — Assessment & Plan Note (Addendum)
Elevated at visit, but generally Controlled, no change in medication DASH diet and commitment to daily physical activity for a minimum of 30 minutes discussed and encouraged, as a part of hypertension management. The importance of attaining a healthy weight is also discussed.  BP/Weight 07/11/2021 04/21/2021 01/16/2021 10/16/2020 10/10/2020 10/09/2020 [id number]  Systolic BP A999333 [id number] [id number] 123456 [id number]  Diastolic BP 80 80 72 86 80 74 84  Wt. (Lbs) 146 151.2 147.1 139.99 138.6 141 -  BMI 25.86 26.78 26.06 24.8 24.55 24.98 -

## 2021-07-13 NOTE — Assessment & Plan Note (Signed)
Persists ,  But less severe

## 2021-07-14 DIAGNOSIS — Z23 Encounter for immunization: Secondary | ICD-10-CM | POA: Diagnosis not present

## 2021-07-14 NOTE — Addendum Note (Signed)
Addended by: Lonn Georgia on: 07/14/2021 08:49 AM   Modules accepted: Orders

## 2021-07-22 ENCOUNTER — Inpatient Hospital Stay (HOSPITAL_COMMUNITY): Payer: 59 | Attending: Hematology

## 2021-07-22 ENCOUNTER — Other Ambulatory Visit: Payer: Self-pay

## 2021-07-22 DIAGNOSIS — G629 Polyneuropathy, unspecified: Secondary | ICD-10-CM | POA: Diagnosis not present

## 2021-07-22 DIAGNOSIS — F17210 Nicotine dependence, cigarettes, uncomplicated: Secondary | ICD-10-CM | POA: Insufficient documentation

## 2021-07-22 DIAGNOSIS — Z801 Family history of malignant neoplasm of trachea, bronchus and lung: Secondary | ICD-10-CM | POA: Insufficient documentation

## 2021-07-22 DIAGNOSIS — C184 Malignant neoplasm of transverse colon: Secondary | ICD-10-CM | POA: Diagnosis not present

## 2021-07-22 DIAGNOSIS — Z809 Family history of malignant neoplasm, unspecified: Secondary | ICD-10-CM | POA: Diagnosis not present

## 2021-07-22 DIAGNOSIS — E785 Hyperlipidemia, unspecified: Secondary | ICD-10-CM | POA: Diagnosis not present

## 2021-07-22 DIAGNOSIS — Z79899 Other long term (current) drug therapy: Secondary | ICD-10-CM | POA: Insufficient documentation

## 2021-07-22 DIAGNOSIS — R200 Anesthesia of skin: Secondary | ICD-10-CM | POA: Diagnosis not present

## 2021-07-22 DIAGNOSIS — E559 Vitamin D deficiency, unspecified: Secondary | ICD-10-CM | POA: Diagnosis not present

## 2021-07-22 DIAGNOSIS — Z8349 Family history of other endocrine, nutritional and metabolic diseases: Secondary | ICD-10-CM | POA: Diagnosis not present

## 2021-07-22 DIAGNOSIS — Z8249 Family history of ischemic heart disease and other diseases of the circulatory system: Secondary | ICD-10-CM | POA: Insufficient documentation

## 2021-07-22 DIAGNOSIS — R5383 Other fatigue: Secondary | ICD-10-CM | POA: Diagnosis not present

## 2021-07-22 DIAGNOSIS — K760 Fatty (change of) liver, not elsewhere classified: Secondary | ICD-10-CM | POA: Insufficient documentation

## 2021-07-22 LAB — COMPREHENSIVE METABOLIC PANEL
ALT: 44 U/L (ref 0–44)
AST: 48 U/L — ABNORMAL HIGH (ref 15–41)
Albumin: 4.7 g/dL (ref 3.5–5.0)
Alkaline Phosphatase: 77 U/L (ref 38–126)
Anion gap: 8 (ref 5–15)
BUN: 7 mg/dL — ABNORMAL LOW (ref 8–23)
CO2: 26 mmol/L (ref 22–32)
Calcium: 9.2 mg/dL (ref 8.9–10.3)
Chloride: 102 mmol/L (ref 98–111)
Creatinine, Ser: 0.69 mg/dL (ref 0.44–1.00)
GFR, Estimated: >60 mL/min (ref >60)
Glucose, Bld: 97 mg/dL (ref 70–99)
Potassium: 3.4 mmol/L — ABNORMAL LOW (ref 3.5–5.1)
Sodium: 136 mmol/L (ref 135–145)
Total Bilirubin: 1.4 mg/dL — ABNORMAL HIGH (ref 0.3–1.2)
Total Protein: 7.9 g/dL (ref 6.5–8.1)

## 2021-07-22 LAB — CBC WITH DIFFERENTIAL/PLATELET
Abs Immature Granulocytes: 0.02 10*3/uL (ref 0.00–0.07)
Basophils Absolute: 0.1 10*3/uL (ref 0.0–0.1)
Basophils Relative: 1 %
Eosinophils Absolute: 0 10*3/uL (ref 0.0–0.5)
Eosinophils Relative: 1 %
HCT: 41.1 % (ref 36.0–46.0)
Hemoglobin: 13.9 g/dL (ref 12.0–15.0)
Immature Granulocytes: 0 %
Lymphocytes Relative: 49 %
Lymphs Abs: 3.5 10*3/uL (ref 0.7–4.0)
MCH: 32.8 pg (ref 26.0–34.0)
MCHC: 33.8 g/dL (ref 30.0–36.0)
MCV: 96.9 fL (ref 80.0–100.0)
Monocytes Absolute: 0.4 10*3/uL (ref 0.1–1.0)
Monocytes Relative: 6 %
Neutro Abs: 3 10*3/uL (ref 1.7–7.7)
Neutrophils Relative %: 43 %
Platelets: 237 10*3/uL (ref 150–400)
RBC: 4.24 MIL/uL (ref 3.87–5.11)
RDW: 13.2 % (ref 11.5–15.5)
WBC: 6.9 10*3/uL (ref 4.0–10.5)
nRBC: 0 % (ref 0.0–0.2)

## 2021-07-22 MED ORDER — HEPARIN SOD (PORK) LOCK FLUSH 100 UNIT/ML IV SOLN
500.0000 [IU] | Freq: Once | INTRAVENOUS | Status: AC
  Administered 2021-07-22: 500 [IU] via INTRAVENOUS

## 2021-07-22 MED ORDER — SODIUM CHLORIDE 0.9% FLUSH
10.0000 mL | Freq: Once | INTRAVENOUS | Status: AC
  Administered 2021-07-22: 10 mL via INTRAVENOUS

## 2021-07-22 NOTE — Patient Instructions (Signed)
[facility]  Discharge Instructions: Thank you for choosing [facility] to provide your oncology and hematology care.  If you have a lab appointment with the Cancer Center, please come in thru the Main Entrance and check in at the main information desk.  Wear comfortable clothing and clothing appropriate for easy access to any Portacath or PICC line.   We strive to give you quality time with your provider. You may need to reschedule your appointment if you arrive late (15 or more minutes).  Arriving late affects you and other patients whose appointments are after yours.  Also, if you miss three or more appointments without notifying the office, you may be dismissed from the clinic at the provider's discretion.      For prescription refill requests, have your pharmacy contact our office and allow 72 hours for refills to be completed.    Today you received the following chemotherapy and/or immunotherapy agents    To help prevent nausea and vomiting after your treatment, we encourage you to take your nausea medication as directed.  BELOW ARE SYMPTOMS THAT SHOULD BE REPORTED IMMEDIATELY: . *FEVER GREATER THAN 100.4 F (38 C) OR HIGHER . *CHILLS OR SWEATING . *NAUSEA AND VOMITING THAT IS NOT CONTROLLED WITH YOUR NAUSEA MEDICATION . *UNUSUAL SHORTNESS OF BREATH . *UNUSUAL BRUISING OR BLEEDING . *URINARY PROBLEMS (pain or burning when urinating, or frequent urination) . *BOWEL PROBLEMS (unusual diarrhea, constipation, pain near the anus) . TENDERNESS IN MOUTH AND THROAT WITH OR WITHOUT PRESENCE OF ULCERS (sore throat, sores in mouth, or a toothache) . UNUSUAL RASH, SWELLING OR PAIN  . UNUSUAL VAGINAL DISCHARGE OR ITCHING   Items with * indicate a potential emergency and should be followed up as soon as possible or go to the Emergency Department if any problems should occur.  Please show the CHEMOTHERAPY ALERT CARD or IMMUNOTHERAPY ALERT CARD at check-in to the  Emergency Department and triage nurse.  Should you have questions after your visit or need to cancel or reschedule your appointment, please contact [facility] [phone]  and follow the prompts.  Office hours are 8:00 a.m. to 4:30 p.m. Monday - Friday. Please note that voicemails left after 4:00 p.m. may not be returned until the following business day.  We are closed weekends and major holidays. You have access to a nurse at all times for urgent questions. Please call the main number to the clinic [phone] and follow the prompts.  For any non-urgent questions, you may also contact your provider using MyChart. We now offer e-Visits for anyone 18 and older to request care online for non-urgent symptoms. For details visit [email or web address].   Also download the MyChart app! Go to the app store, search "MyChart", open the app, select , and log in with your MyChart username and password.  Due to Covid, a mask is required upon entering the hospital/clinic. If you do not have a mask, one will be given to you upon arrival. For doctor visits, patients may have 1 support person aged 18 or older with them. For treatment visits, patients cannot have anyone with them due to current Covid guidelines and our immunocompromised population.  

## 2021-07-22 NOTE — Progress Notes (Signed)
Andrea Simon presented for Portacath access and flush.  Portacath located left chest wall accessed with  H 20 needle.  No blood return and labs drawn peripherally.  Portacath flushed with 75m NS and 500U/518mHeparin and needle removed intact.  Procedure tolerated well and without incident.  Discharge from clinic ambulatory in stable condition.  Alert and oriented X 3.  [facility]

## 2021-07-23 LAB — CEA: CEA: 6 ng/mL — ABNORMAL HIGH (ref 0.0–4.7)

## 2021-07-29 ENCOUNTER — Encounter (HOSPITAL_COMMUNITY): Payer: Self-pay | Admitting: Hematology and Oncology

## 2021-07-29 ENCOUNTER — Other Ambulatory Visit: Payer: Self-pay

## 2021-07-29 ENCOUNTER — Inpatient Hospital Stay (HOSPITAL_BASED_OUTPATIENT_CLINIC_OR_DEPARTMENT_OTHER): Payer: 59 | Admitting: Hematology and Oncology

## 2021-07-29 VITALS — BP 122/78 | HR 74 | Temp 98.7°F | Resp 16 | Wt 143.0 lb

## 2021-07-29 DIAGNOSIS — C184 Malignant neoplasm of transverse colon: Secondary | ICD-10-CM | POA: Diagnosis not present

## 2021-07-29 DIAGNOSIS — Z87891 Personal history of nicotine dependence: Secondary | ICD-10-CM | POA: Diagnosis not present

## 2021-07-29 NOTE — Progress Notes (Signed)
Andrea Simon, [place] [id number]   CLINIC:  Medical Oncology/Hematology  PCP:  Fayrene Helper, MD [address] / [place] [address] [phone]   REASON FOR VISIT:  Follow-up for stage III transverse colon adenocarcinoma  PRIOR THERAPY:  1. Laparoscopic right colectomy on 01/09/2019. 2. FOLFOX x 12 cycles from 03/01/2019 to 08/22/2019.  NGS Results: Not done  CURRENT THERAPY: Surveillance  BRIEF ONCOLOGIC HISTORY:  Oncology History  Colon cancer ([place])  01/09/2019 Initial Diagnosis   Colon cancer ([place])    03/01/2019 -  Chemotherapy   The patient had palonosetron (ALOXI) injection 0.25 mg, 0.25 mg, Intravenous,  Once, 12 of 12 cycles Administration: 0.25 mg (03/01/2019), 0.25 mg (03/15/2019), 0.25 mg (03/27/2019), 0.25 mg (04/10/2019), 0.25 mg (05/09/2019), 0.25 mg (05/23/2019), 0.25 mg (06/06/2019), 0.25 mg (06/20/2019), 0.25 mg (07/25/2019), 0.25 mg (07/11/2019), 0.25 mg (08/08/2019), 0.25 mg (08/22/2019) pegfilgrastim (NEULASTA) injection 6 mg, 6 mg, Subcutaneous, Once, 8 of 8 cycles Administration: 6 mg (05/11/2019), 6 mg (05/25/2019), 6 mg (06/08/2019), 6 mg (06/22/2019), 6 mg (07/13/2019), 6 mg (07/27/2019), 6 mg (08/10/2019), 6 mg (08/24/2019) leucovorin 600 mg in dextrose 5 % 250 mL infusion, 640 mg, Intravenous,  Once, 12 of 12 cycles Administration: 600 mg (03/01/2019), 600 mg (03/15/2019), 640 mg (03/27/2019), 640 mg (04/10/2019), 640 mg (05/09/2019), 640 mg (05/23/2019), 640 mg (06/06/2019), 640 mg (06/20/2019), 640 mg (07/25/2019), 640 mg (07/11/2019), 640 mg (08/08/2019), 640 mg (08/22/2019) oxaliplatin (ELOXATIN) 135 mg in dextrose 5 % 500 mL chemo infusion, 85 mg/m2 = 135 mg, Intravenous,  Once, 10 of 10 cycles Dose modification: 68 mg/m2 (80 % of original dose 85 mg/m2, Cycle 5, Reason: Provider Judgment), 68 mg/m2 (original dose 85 mg/m2, Cycle 6, Reason: Provider Judgment), 51 mg/m2 (60 % of original dose 85 mg/m2, Cycle 10, Reason: Other (see  comments), Comment: neuropathy) Administration: 135 mg (03/01/2019), 135 mg (03/15/2019), 135 mg (03/27/2019), 135 mg (04/10/2019), 110 mg (05/09/2019), 110 mg (05/23/2019), 110 mg (06/06/2019), 110 mg (06/20/2019), 80 mg (07/25/2019), 110 mg (07/11/2019) fluorouracil (ADRUCIL) chemo injection 650 mg, 400 mg/m2 = 650 mg, Intravenous,  Once, 12 of 12 cycles Administration: 650 mg (03/01/2019), 650 mg (03/15/2019), 650 mg (03/27/2019), 650 mg (04/10/2019), 650 mg (05/09/2019), 650 mg (05/23/2019), 650 mg (06/06/2019), 650 mg (06/20/2019), 650 mg (07/25/2019), 650 mg (07/11/2019), 650 mg (08/08/2019), 650 mg (08/22/2019) fluorouracil (ADRUCIL) 3,850 mg in sodium chloride 0.9 % 73 mL chemo infusion, 2,400 mg/m2 = 3,850 mg, Intravenous, 1 Day/Dose, 12 of 12 cycles Administration: 3,850 mg (03/01/2019), 3,850 mg (03/15/2019), 3,850 mg (03/27/2019), 3,850 mg (04/10/2019), 3,850 mg (05/09/2019), 3,850 mg (05/23/2019), 3,850 mg (06/06/2019), 3,850 mg (06/20/2019), 3,850 mg (07/25/2019), 3,850 mg (07/11/2019), 3,850 mg (08/08/2019), 3,850 mg (08/22/2019)   for chemotherapy treatment.       CANCER STAGING: Cancer Staging No matching staging information was found for the patient.  INTERVAL HISTORY:  Andrea Simon, a 65 y.o. female, returns for routine follow-up of her stage III transverse colon adenocarcinoma. Andrea Simon was last seen on 05/11/2021.   On exam Andrea Simon reports that she feels well.  Her levels are good and her appetite is excellent.  She notes that her energy is about 80% out of 100.  She has that she is not having any new symptoms but does still have some residual tingling in her feet from the chemotherapy.  She otherwise denies any fevers, chills, sweats, nausea, vomiting or diarrhea.  A full 10 point ROS is  listed below.   REVIEW OF SYSTEMS:  Review of Systems  Constitutional:  Positive for fatigue (50%). Negative for appetite change.  Gastrointestinal:  Negative for blood in stool, constipation and diarrhea.   Neurological:  Positive for numbness (intermittent in feet).  All other systems reviewed and are negative.  PAST MEDICAL/SURGICAL HISTORY:  Past Medical History:  Diagnosis Date   Cancer ([place]) 11/2018   Phreesia 09/16/2020   Depression, major, single episode, moderate ([place]) 09/24/2020   Score of 5 in 12/2020 on Wellbutrin   Hyperlipidemia    Insomnia due to anxiety and fear 01/27/2019   Multinodular goiter    Prediabetes    Past Surgical History:  Procedure Laterality Date   BIOPSY  12/07/2018   Procedure: BIOPSY;  Surgeon: Rogene Houston, MD;  [facility];  Service: Endoscopy;;  sigmoid colon   COLON RESECTION N/A 01/09/2019   Procedure: LAPAROSCOPIC RIGHT HEMICOLECTOMY;  Surgeon: Virl Cagey, MD;  Location: [facility] OR[facility];  Service: General;  Laterality: N/A;   COLON SURGERY N/A    Phreesia 09/16/2020   COLONOSCOPY N/A 12/07/2018   Procedure: COLONOSCOPY;  Surgeon: Rogene Houston, MD;  Location: [facility];  Service: Endoscopy;  Laterality: N/A;  12:45   COLONOSCOPY WITH PROPOFOL N/A 10/16/2020   Procedure: COLONOSCOPY WITH PROPOFOL;  Surgeon: Rogene Houston, MD;  [facility];  Service: Endoscopy;  Laterality: N/A;  [place] OF UTERUS  2006   FNA thyroid  2011 and 2019   benign   POLYPECTOMY  12/07/2018   Procedure: POLYPECTOMY;  Surgeon: Rogene Houston, MD;  [facility];  Service: Endoscopy;;  splenic flexure (CS x1)   POLYPECTOMY  10/16/2020   Procedure: POLYPECTOMY;  Surgeon: Rogene Houston, MD;  [facility];  Service: Endoscopy;;   PORTACATH PLACEMENT Left 02/15/2019   Procedure: INSERTION PORT-A-CATH;  Surgeon: Virl Cagey, MD;  Location: [facility];  Service: General;  Laterality: Left;   TUBAL LIGATION  1992    SOCIAL HISTORY:  Social History   Socioeconomic History   Marital status: Married    Spouse name: Not on file   Number of children: 1   Years of education: Not on file    Highest education level: Not on file  Occupational History   Occupation: Theme park manager - [employer]   Tobacco Use   Smoking status: Every Day    Packs/day: 0.75    Years: 45.00    Pack years: 33.75    Types: Cigarettes   Smokeless tobacco: Never  Vaping Use   Vaping Use: Never used  Substance and Sexual Activity   Alcohol use: Yes    Alcohol/week: 0.0 standard drinks    Comment: occasionally   Drug use: No   Sexual activity: Yes  Other Topics Concern   Not on file  Social History Narrative   Not on file   Social Determinants of Health   Financial Resource Strain: Not on file  Food Insecurity: Not on file  Transportation Needs: Not on file  Physical Activity: Not on file  Stress: Not on file  Social Connections: Not on file  Intimate Partner Violence: Not on file    FAMILY HISTORY:  Family History  Problem Relation Age of Onset   Hypertension Mother        AAA   Coronary artery disease Mother    Hyperlipidemia Mother    Cancer Mother  lung   Hypertension Father    Cancer Brother     CURRENT MEDICATIONS:  Current Outpatient Medications  Medication Sig Dispense Refill   amLODipine (NORVASC) 2.5 MG tablet Take 1 tablet by mouth once daily 30 tablet 0   aspirin EC 81 MG tablet Take 1 tablet (81 mg total) by mouth every evening. 30 tablet 11   Calcium Carbonate-Vitamin D 600-400 MG-UNIT tablet Take 1 tablet by mouth daily.      rosuvastatin (CRESTOR) 10 MG tablet Take 1 tablet (10 mg total) by mouth daily. 90 tablet 3   acetaminophen (TYLENOL) 500 MG tablet Take 1,000 mg by mouth every 6 (six) hours as needed for moderate pain.  (Patient not taking: Reported on 07/29/2021)     lidocaine-prilocaine (EMLA) cream Apply 1 application topically daily as needed (prior to port flush). (Patient not taking: Reported on 07/29/2021)     No current facility-administered medications for this visit.   Facility-Administered Medications Ordered in Other Visits  Medication Dose  Route Frequency Provider Last Rate Last Admin   sodium chloride flush (NS) 0.9 % injection 10 mL  10 mL Intracatheter PRN Derek Jack, MD   10 mL at 04/26/19 0928   sodium chloride flush (NS) 0.9 % injection 10 mL  10 mL Intravenous PRN Derek Jack, MD   10 mL at 01/09/21 1430    ALLERGIES:  No Known Allergies  PHYSICAL EXAM:  Performance status (ECOG): 1 - Symptomatic but completely ambulatory  Vitals:   07/29/21 1502  BP: 122/78  Pulse: 74  Resp: 16  Temp: 98.7 F (37.1 C)  SpO2: 98%   Wt Readings from Last 3 Encounters:  07/29/21 143 lb (64.9 kg)  07/11/21 146 lb (66.2 kg)  04/21/21 151 lb 3.2 oz (68.6 kg)   Physical Exam Vitals reviewed.  Constitutional:      Appearance: Normal appearance.  Cardiovascular:     Rate and Rhythm: Normal rate and regular rhythm.     Pulses: Normal pulses.     Heart sounds: Normal heart sounds.  Pulmonary:     Effort: Pulmonary effort is normal.     Breath sounds: Normal breath sounds.  Abdominal:     Palpations: Abdomen is soft. There is no hepatomegaly, splenomegaly or mass.     Tenderness: There is no abdominal tenderness.     Hernia: No hernia is present.  Musculoskeletal:     Right lower leg: No edema.     Left lower leg: No edema.  Neurological:     General: No focal deficit present.     Mental Status: She is alert and oriented to person, place, and time.  Psychiatric:        Mood and Affect: Mood normal.        Behavior: Behavior normal.     LABORATORY DATA:  I have reviewed the labs as listed.  CBC Latest Ref Rng & Units 07/22/2021 04/15/2021 01/09/2021  WBC 4.0 - 10.5 K/uL 6.9 6.2 6.0  Hemoglobin 12.0 - 15.0 g/dL 13.9 13.4 13.1  Hematocrit 36.0 - 46.0 % 41.1 37.1 37.4  Platelets 150 - 400 K/uL 237 180 244   CMP Latest Ref Rng & Units 07/22/2021 04/15/2021 01/09/2021  Glucose 70 - 99 mg/dL 97 166(H) 101(H)  BUN 8 - 23 mg/dL 7(L) 8 11  Creatinine 0.44 - 1.00 mg/dL 0.69 0.72 0.83  Sodium 135 - 145 mmol/L  136 137 138  Potassium 3.5 - 5.1 mmol/L 3.4(L) 3.7 3.4(L)  Chloride 98 - 111  mmol/L 102 103 102  CO2 22 - 32 mmol/L _0 Calcium 8.9 - 10.3 mg/dL 9.2 9.7 9.7  Total Protein 6.5 - 8.1 g/dL 7.9 7.3 7.6  Total Bilirubin 0.3 - 1.2 mg/dL 1.4(H) 1.6(H) 1.4(H)  Alkaline Phos 38 - 126 U/L 77 68 59  AST 15 - 41 U/L 48(H) 52(H) 33  ALT 0 - 44 U/L 44 51(H) 33   Lab Results  Component Value Date   VD25OH 40.08 04/15/2021   VD25OH 46.85 01/09/2021   VD25OH 26.05 (L) 10/01/2020   Lab Results  Component Value Date   CEA1 6.0 (H) 07/22/2021   CEA1 6.2 (H) 04/15/2021   CEA1 6.0 (H) 01/09/2021    DIAGNOSTIC IMAGING:  I have independently reviewed the scans and discussed with the patient. No results found.    ASSESSMENT:  1.  Stage III (PT4BPN1A) transverse colon adenocarcinoma: -Right colectomy on 01/09/2019, 1/24 lymph nodes positive, no tumor deposits, no perineural invasion, grade 2, tumor extending through serosa into adherent omentum, margins negative. -PET scan on 01/31/2019 shows focal hypermetabolic activity identified in the ileocecal anastomosis with SUV 9. -12 cycles of FOLFOX from 03/01/2019 through 08/22/2019. -CTAP on 03/26/2019 did not show any evidence of recurrence or metastatic disease. -CTAP from 10/03/2020 shows no evidence of recurrence or metastatic disease.   2.  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  MSI was high by PCR.  BRAF testing was negative. -MLH1 hyper methylation was present.  Thought to be sporadic cancer.   3.  Smoking history: -40+ pack year smoker. -CT low-dose on 12/05/2019 was lung RADS 2 with emphysema. -CT chest on 12/25/2020 was lung RADS 2.   PLAN:  1.  Stage III (PT4BPN1A) transverse colon adenocarcinoma: -Last colonoscopy on 10/16/2020 which did not show any major abnormalities. - She denies any change in bowel habits or bleeding per rectum. - Reviewed CTAP from 04/15/2021 which showed postoperative findings with no evidence  of recurrence or metastatic disease.  Hepatic steatosis. - Reviewed labs from 07/22/2021.  CEA was slightly elevated at 6.0 and more or less stable. - Her AST and ALT were at 48 and 44 respectively.  No new medications reported.  We will plan to repeat labs including CEA and LFTs in 3 months. - We will plan to repeat another CT scan in Oct 2022 prior to her next visit.    2.  Peripheral neuropathy: -Mild neuropathy in the feet from prior oxaliplatin is stable.  No neuropathic pains.   3.  Vitamin D deficiency: -Continue vitamin D supplements.  Vitamin D level is 40.08.   4.  Smoking history: -Last CT chest was on 12/25/2020.  She will require another scan in January 2023.   Orders placed this encounter:  Orders Placed This Encounter  Procedures   CT CHEST ABDOMEN PELVIS W CONTRAST    Ledell Peoples, MD Department of Hematology/Oncology [place] at [facility] Phone: [phone] Pager: [phone] Email: [email or web address]

## 2021-07-31 ENCOUNTER — Other Ambulatory Visit (HOSPITAL_COMMUNITY): Payer: Self-pay

## 2021-07-31 DIAGNOSIS — C184 Malignant neoplasm of transverse colon: Secondary | ICD-10-CM

## 2021-08-27 ENCOUNTER — Other Ambulatory Visit: Payer: Self-pay | Admitting: Family Medicine

## 2021-08-27 DIAGNOSIS — I10 Essential (primary) hypertension: Secondary | ICD-10-CM

## 2021-09-06 ENCOUNTER — Other Ambulatory Visit: Payer: Self-pay | Admitting: Cardiology

## 2021-10-06 ENCOUNTER — Other Ambulatory Visit: Payer: Self-pay | Admitting: Family Medicine

## 2021-10-06 DIAGNOSIS — I10 Essential (primary) hypertension: Secondary | ICD-10-CM

## 2021-10-14 ENCOUNTER — Encounter (HOSPITAL_COMMUNITY): Payer: Self-pay | Admitting: Hematology

## 2021-10-14 LAB — CBC WITH DIFFERENTIAL/PLATELET
Basophils Absolute: 0.1 10*3/uL (ref 0.0–0.2)
Basos: 1 %
EOS (ABSOLUTE): 0 10*3/uL (ref 0.0–0.4)
Eos: 1 %
Hematocrit: 39 % (ref 34.0–46.6)
Hemoglobin: 14 g/dL (ref 11.1–15.9)
Immature Grans (Abs): 0 10*3/uL (ref 0.0–0.1)
Immature Granulocytes: 0 %
Lymphocytes Absolute: 2.8 10*3/uL (ref 0.7–3.1)
Lymphs: 49 %
MCH: 34.2 pg — ABNORMAL HIGH (ref 26.6–33.0)
MCHC: 35.9 g/dL — ABNORMAL HIGH (ref 31.5–35.7)
MCV: 95 fL (ref 79–97)
Monocytes Absolute: 0.3 10*3/uL (ref 0.1–0.9)
Monocytes: 6 %
Neutrophils Absolute: 2.5 10*3/uL (ref 1.4–7.0)
Neutrophils: 43 %
Platelets: 193 10*3/uL (ref 150–450)
RBC: 4.09 x10E6/uL (ref 3.77–5.28)
RDW: 14.3 % (ref 11.7–15.4)
WBC: 5.8 10*3/uL (ref 3.4–10.8)

## 2021-10-14 LAB — CMP14+EGFR
ALT: 39 IU/L — ABNORMAL HIGH (ref 0–32)
AST: 44 IU/L — ABNORMAL HIGH (ref 0–40)
Albumin/Globulin Ratio: 2.1 (ref 1.2–2.2)
Albumin: 4.8 g/dL (ref 3.8–4.8)
Alkaline Phosphatase: 80 IU/L (ref 44–121)
BUN/Creatinine Ratio: 13 (ref 12–28)
BUN: 11 mg/dL (ref 8–27)
Bilirubin Total: 1.4 mg/dL — ABNORMAL HIGH (ref 0.0–1.2)
CO2: 23 mmol/L (ref 20–29)
Calcium: 10 mg/dL (ref 8.7–10.3)
Chloride: 102 mmol/L (ref 96–106)
Creatinine, Ser: 0.84 mg/dL (ref 0.57–1.00)
Globulin, Total: 2.3 g/dL (ref 1.5–4.5)
Glucose: 163 mg/dL — ABNORMAL HIGH (ref 70–99)
Potassium: 3.8 mmol/L (ref 3.5–5.2)
Sodium: 140 mmol/L (ref 134–144)
Total Protein: 7.1 g/dL (ref 6.0–8.5)
eGFR: 78 mL/min/{1.73_m2} (ref >59)

## 2021-10-14 LAB — TSH: TSH: 1.83 u[IU]/mL (ref 0.450–4.500)

## 2021-10-14 LAB — LIPID PANEL
Chol/HDL Ratio: 3.8 ratio (ref 0.0–4.4)
Cholesterol, Total: 147 mg/dL (ref 100–199)
HDL: 39 mg/dL — ABNORMAL LOW (ref >39)
LDL Chol Calc (NIH): 88 mg/dL (ref 0–99)
Triglycerides: 111 mg/dL (ref 0–149)
VLDL Cholesterol Cal: 20 mg/dL (ref 5–40)

## 2021-10-14 LAB — HEMOGLOBIN A1C
Est. average glucose Bld gHb Est-mCnc: 171 mg/dL
Hgb A1c MFr Bld: 7.6 % — ABNORMAL HIGH (ref 4.8–5.6)

## 2021-10-14 LAB — VITAMIN D 25 HYDROXY (VIT D DEFICIENCY, FRACTURES): Vit D, 25-Hydroxy: 64.2 ng/mL (ref 30.0–100.0)

## 2021-10-17 ENCOUNTER — Other Ambulatory Visit: Payer: Self-pay

## 2021-10-17 ENCOUNTER — Ambulatory Visit (INDEPENDENT_AMBULATORY_CARE_PROVIDER_SITE_OTHER): Payer: 59 | Admitting: Family Medicine

## 2021-10-17 ENCOUNTER — Encounter: Payer: Self-pay | Admitting: Family Medicine

## 2021-10-17 VITALS — BP 117/78 | HR 93 | Resp 16 | Ht 63.0 in | Wt 149.0 lb

## 2021-10-17 DIAGNOSIS — Z0000 Encounter for general adult medical examination without abnormal findings: Secondary | ICD-10-CM

## 2021-10-17 DIAGNOSIS — F17200 Nicotine dependence, unspecified, uncomplicated: Secondary | ICD-10-CM | POA: Diagnosis not present

## 2021-10-17 DIAGNOSIS — Z0001 Encounter for general adult medical examination with abnormal findings: Secondary | ICD-10-CM

## 2021-10-17 DIAGNOSIS — E1149 Type 2 diabetes mellitus with other diabetic neurological complication: Secondary | ICD-10-CM | POA: Diagnosis not present

## 2021-10-17 DIAGNOSIS — Z1231 Encounter for screening mammogram for malignant neoplasm of breast: Secondary | ICD-10-CM

## 2021-10-17 DIAGNOSIS — E119 Type 2 diabetes mellitus without complications: Secondary | ICD-10-CM

## 2021-10-17 DIAGNOSIS — R7303 Prediabetes: Secondary | ICD-10-CM

## 2021-10-17 DIAGNOSIS — Z23 Encounter for immunization: Secondary | ICD-10-CM

## 2021-10-17 MED ORDER — BLOOD GLUCOSE METER KIT: PACK | 0 refills | Status: DC

## 2021-10-17 MED ORDER — ROSUVASTATIN CALCIUM 10 MG PO TABS
10.0000 mg | ORAL_TABLET | Freq: Every day | ORAL | 3 refills | Status: DC
Start: 2021-10-17 — End: 2022-01-23

## 2021-10-17 MED ORDER — GLUCOSE BLOOD VI STRP: ORAL_STRIP | 5 refills | Status: DC

## 2021-10-17 MED ORDER — AMLODIPINE BESYLATE 2.5 MG PO TABS
2.5000 mg | ORAL_TABLET | Freq: Every day | ORAL | 3 refills | Status: DC
Start: 2021-10-17 — End: 2022-01-23

## 2021-10-17 MED ORDER — LANCETS 30G MISC: 5 refills | Status: DC

## 2021-10-17 MED ORDER — METFORMIN HCL 500 MG PO TABS: 500.0000 mg | ORAL_TABLET | Freq: Every day | ORAL | 3 refills | Status: DC

## 2021-10-17 NOTE — Progress Notes (Signed)
Andrea Simon     MRN: [id number]      DOB: 1956-09-29  HPI: Patient is in for annual physical exam. New diagnosis of diabetes is addressed, also smoking cessation discussed and medication  offered to help with quitting.No interest in commiting to cessation currently C/o intermittent heartburn relieved with pepcid, will monitor Recent labs,  are reviewed. Immunization is reviewed , and  updated if needed.   PE: BP 117/78   Pulse 93   Resp 16   Ht 5\' 3"  (1.6 m)   Wt 149 lb (67.6 kg)   SpO2 97%   BMI 26.39 kg/m   Pleasant  female, alert and oriented x 3, in no cardio-pulmonary distress. Afebrile. HEENT No facial trauma or asymetry. Sinuses non tender.  Extra occullar muscles intact.. External ears normal, . Neck: supple, no adenopathy,JVD or thyromegaly.No bruits.  Chest: Clear to ascultation bilaterally.No crackles or wheezes.Decreased airentry Non tender to palpation  Breast: No asymetry,no masses or lumps. No tenderness. No nipple discharge or inversion. No axillary or supraclavicular adenopathy  Cardiovascular system; Heart sounds normal,  S1 and  S2 ,no S3.  No murmur, or thrill. Apical beat not displaced Peripheral pulses normal.  Abdomen: Soft, non tender, no organomegaly or masses. No bruits. Bowel sounds normal. No guarding, tenderness or rebound.    Musculoskeletal exam: Full ROM of spine, hips , shoulders and knees. No deformity ,swelling or crepitus noted. No muscle wasting or atrophy.   Neurologic: Cranial nerves 2 to 12 intact. Power, tone ,sensation and reflexes normal throughout. No disturbance in gait. No tremor.  Skin: Intact, no ulceration, erythema , scaling or rash noted. Pigmentation normal throughout  Psych; Normal mood and affect. Judgement and concentration normal   Assessment & Plan:  Annual physical exam Annual exam as documented. Counseling done  re healthy lifestyle involving commitment to 150 minutes exercise  per week, heart healthy diet, and attaining healthy weight.The importance of adequate sleep also discussed. Regular seat belt use and home safety, is also discussed. Changes in health habits are decided on by the patient with goals and time frames  set for achieving them. Immunization and cancer screening needs are specifically addressed at this visit.   NICOTINE ADDICTION Asked:confirms currently smokes cigarettes Assess: Unwilling to set a quit date, but is cutting back Advise: needs to QUIT to reduce risk of cancer, cardio and cerebrovascular disease Assist: counseled for 5 minutes and literature provided Arrange: follow up in 2 to 4 months   Type 2 diabetes mellitus with neurological complications ([place]) Andrea Simon is reminded of the importance of commitment to daily physical activity for 30 minutes or more, as able and the need to limit carbohydrate intake to 30 to 60 grams per meal to help with blood sugar control.   The need to take medication as prescribed, test blood sugar as directed, and to call between visits if there is a concern that blood sugar is uncontrolled is also discussed.   Andrea Simon is reminded of the importance of daily foot exam, annual eye examination, and good blood sugar, blood pressure and cholesterol control.  Diabetic Labs Latest Ref Rng & Units 10/13/2021 07/22/2021 04/15/2021 01/09/2021 10/01/2020  HbA1c 4.8 - 5.6 % 7.6(H) - - - -  Microalbumin Not Estab. ug/mL - - - - -  Micro/Creat Ratio 0.0 - 30.0 mg/g creat - - - - -  Chol 100 - 199 mg/dL 147 - - - -  HDL >39 mg/dL 39(L) - - - -  Calc LDL 0 - 99 mg/dL 88 - - - -  Triglycerides 0 - 149 mg/dL 111 - - - -  Creatinine 0.57 - 1.00 mg/dL 0.84 0.69 0.72 0.83 0.78   BP/Weight 10/17/2021 07/29/2021 07/22/2021 07/11/2021 04/21/2021 01/16/2021 89/21/1941  Systolic BP 740 814 481 856 314 970 263  Diastolic BP 78 78 82 80 80 72 86  Wt. (Lbs) 149 143 - 146 151.2 147.1 139.99  BMI 26.39 25.33 - 25.86 26.78 26.06 24.8    Foot/eye exam completion dates Latest Ref Rng & Units 11/08/2018 08/18/2016  Eye Exam No Retinopathy No Retinopathy -  Foot Form Completion - - Done

## 2021-10-17 NOTE — Patient Instructions (Addendum)
Annual exam in 1 year  F/u in office in 13 to 14 weeks, please call if you need me before   Flu vaccine todAY  mICROALB TODAY  If you decide on wellbutrin or nicotrol inhaler or patch to help with smoking cessation , please let me know  Pepcid as needed, for heartburn, is ok if does not become  more  frequent or severe, if it does, please let me know  NON FAST CHEM 7 AND EgfR AND hbA1c 3 TO 5 DAYS BEFORE NEXT VISIT  Nurse please refer for diabetic ed if patient agrees  PLEASE schedule mammogram at checkout  Need diabetic eye exam, arrange in house retinopathy screen please or refer to ophthalmology  You are again diabetic and need to change food choice and eatring habits, new is metforminn  one daily Glucose meter and once daily test supplies to be sent by nurse Test fasting sugars and record, goal is 80 to 120  It is important that you exercise regularly at least 30 minutes 5 times a week. If you develop chest pain, have severe difficulty breathing, or feel very tired, stop exercising immediately and seek medical attention   Think about what you will eat, plan ahead. Choose " clean, green, fresh or frozen" over canned, processed or packaged foods which are more sugary, salty and fatty. 70 to 75% of food eaten should be vegetables and fruit. Three meals at set times with snacks allowed between meals, but they must be fruit or vegetables. Aim to eat over a 12 hour period , example 7 am to 7 pm, and STOP after  your last meal of the day. Drink water,generally about 64 ounces per day, no other drink is as healthy. Fruit juice is best enjoyed in a healthy way, by EATING the fruit.   Thanks for choosing [facility], we consider it a privelige to serve you.

## 2021-10-19 ENCOUNTER — Encounter: Payer: Self-pay | Admitting: Family Medicine

## 2021-10-19 NOTE — Assessment & Plan Note (Signed)
Asked:confirms currently smokes cigarettes °Assess: Unwilling to set a quit date, but is cutting back °Advise: needs to QUIT to reduce risk of cancer, cardio and cerebrovascular disease °Assist: counseled for 5 minutes and literature provided °Arrange: follow up in 2 to 4 months ° °

## 2021-10-19 NOTE — Assessment & Plan Note (Signed)
Andrea Simon is reminded of the importance of commitment to daily physical activity for 30 minutes or more, as able and the need to limit carbohydrate intake to 30 to 60 grams per meal to help with blood sugar control.   The need to take medication as prescribed, test blood sugar as directed, and to call between visits if there is a concern that blood sugar is uncontrolled is also discussed.   Andrea Simon is reminded of the importance of daily foot exam, annual eye examination, and good blood sugar, blood pressure and cholesterol control.  Diabetic Labs Latest Ref Rng & Units 10/13/2021 07/22/2021 04/15/2021 01/09/2021 10/01/2020  HbA1c 4.8 - 5.6 % 7.6(H) - - - -  Microalbumin Not Estab. ug/mL - - - - -  Micro/Creat Ratio 0.0 - 30.0 mg/g creat - - - - -  Chol 100 - 199 mg/dL 147 - - - -  HDL >39 mg/dL 39(L) - - - -  Calc LDL 0 - 99 mg/dL 88 - - - -  Triglycerides 0 - 149 mg/dL 111 - - - -  Creatinine 0.57 - 1.00 mg/dL 0.84 0.69 0.72 0.83 0.78   BP/Weight 10/17/2021 07/29/2021 07/22/2021 07/11/2021 04/21/2021 01/16/2021 06/13/7627  Systolic BP 315 176 160 737 106 269 485  Diastolic BP 78 78 82 80 80 72 86  Wt. (Lbs) 149 143 - 146 151.2 147.1 139.99  BMI 26.39 25.33 - 25.86 26.78 26.06 24.8   Foot/eye exam completion dates Latest Ref Rng & Units 11/08/2018 08/18/2016  Eye Exam No Retinopathy No Retinopathy -  Foot Form Completion - - Done

## 2021-10-19 NOTE — Assessment & Plan Note (Signed)

## 2021-10-20 LAB — MICROALBUMIN / CREATININE URINE RATIO
Creatinine, Urine: 109.4 mg/dL
Microalb/Creat Ratio: 15 mg/g creat (ref 0–29)
Microalbumin, Urine: 16.6 ug/mL

## 2021-10-22 ENCOUNTER — Other Ambulatory Visit: Payer: Self-pay

## 2021-10-22 ENCOUNTER — Encounter (HOSPITAL_COMMUNITY): Payer: Self-pay | Admitting: Radiology

## 2021-10-22 ENCOUNTER — Ambulatory Visit (HOSPITAL_COMMUNITY)
Admission: RE | Admit: 2021-10-22 | Discharge: 2021-10-22 | Disposition: A | Discharge status: Home / Self Care | Payer: 59 | Source: Ambulatory Visit | Attending: Hematology and Oncology | Admitting: Hematology and Oncology

## 2021-10-22 ENCOUNTER — Other Ambulatory Visit (HOSPITAL_COMMUNITY): Payer: 59

## 2021-10-22 ENCOUNTER — Inpatient Hospital Stay (HOSPITAL_COMMUNITY): Payer: 59 | Attending: Hematology

## 2021-10-22 DIAGNOSIS — C184 Malignant neoplasm of transverse colon: Secondary | ICD-10-CM

## 2021-10-22 DIAGNOSIS — Z8349 Family history of other endocrine, nutritional and metabolic diseases: Secondary | ICD-10-CM | POA: Insufficient documentation

## 2021-10-22 DIAGNOSIS — K8020 Calculus of gallbladder without cholecystitis without obstruction: Secondary | ICD-10-CM | POA: Insufficient documentation

## 2021-10-22 DIAGNOSIS — G629 Polyneuropathy, unspecified: Secondary | ICD-10-CM | POA: Insufficient documentation

## 2021-10-22 DIAGNOSIS — Z809 Family history of malignant neoplasm, unspecified: Secondary | ICD-10-CM | POA: Insufficient documentation

## 2021-10-22 DIAGNOSIS — F17210 Nicotine dependence, cigarettes, uncomplicated: Secondary | ICD-10-CM | POA: Insufficient documentation

## 2021-10-22 DIAGNOSIS — Z79899 Other long term (current) drug therapy: Secondary | ICD-10-CM | POA: Insufficient documentation

## 2021-10-22 DIAGNOSIS — E119 Type 2 diabetes mellitus without complications: Secondary | ICD-10-CM | POA: Insufficient documentation

## 2021-10-22 DIAGNOSIS — R9389 Abnormal findings on diagnostic imaging of other specified body structures: Secondary | ICD-10-CM | POA: Insufficient documentation

## 2021-10-22 DIAGNOSIS — K760 Fatty (change of) liver, not elsewhere classified: Secondary | ICD-10-CM | POA: Insufficient documentation

## 2021-10-22 DIAGNOSIS — Z801 Family history of malignant neoplasm of trachea, bronchus and lung: Secondary | ICD-10-CM | POA: Insufficient documentation

## 2021-10-22 DIAGNOSIS — E079 Disorder of thyroid, unspecified: Secondary | ICD-10-CM | POA: Insufficient documentation

## 2021-10-22 DIAGNOSIS — D259 Leiomyoma of uterus, unspecified: Secondary | ICD-10-CM | POA: Insufficient documentation

## 2021-10-22 DIAGNOSIS — E785 Hyperlipidemia, unspecified: Secondary | ICD-10-CM | POA: Insufficient documentation

## 2021-10-22 DIAGNOSIS — E559 Vitamin D deficiency, unspecified: Secondary | ICD-10-CM | POA: Insufficient documentation

## 2021-10-22 DIAGNOSIS — Z8249 Family history of ischemic heart disease and other diseases of the circulatory system: Secondary | ICD-10-CM | POA: Insufficient documentation

## 2021-10-22 DIAGNOSIS — I700 Atherosclerosis of aorta: Secondary | ICD-10-CM | POA: Insufficient documentation

## 2021-10-22 LAB — CBC WITH DIFFERENTIAL/PLATELET
Abs Immature Granulocytes: 0.01 10*3/uL (ref 0.00–0.07)
Basophils Absolute: 0.1 10*3/uL (ref 0.0–0.1)
Basophils Relative: 1 %
Eosinophils Absolute: 0 10*3/uL (ref 0.0–0.5)
Eosinophils Relative: 1 %
HCT: 41.4 % (ref 36.0–46.0)
Hemoglobin: 14.8 g/dL (ref 12.0–15.0)
Immature Granulocytes: 0 %
Lymphocytes Relative: 44 %
Lymphs Abs: 2 10*3/uL (ref 0.7–4.0)
MCH: 35.2 pg — ABNORMAL HIGH (ref 26.0–34.0)
MCHC: 35.7 g/dL (ref 30.0–36.0)
MCV: 98.6 fL (ref 80.0–100.0)
Monocytes Absolute: 0.3 10*3/uL (ref 0.1–1.0)
Monocytes Relative: 6 %
Neutro Abs: 2.2 10*3/uL (ref 1.7–7.7)
Neutrophils Relative %: 48 %
Platelets: 196 10*3/uL (ref 150–400)
RBC: 4.2 MIL/uL (ref 3.87–5.11)
RDW: 14.4 % (ref 11.5–15.5)
WBC: 4.6 10*3/uL (ref 4.0–10.5)
nRBC: 0 % (ref 0.0–0.2)

## 2021-10-22 LAB — COMPREHENSIVE METABOLIC PANEL
ALT: 65 U/L — ABNORMAL HIGH (ref 0–44)
AST: 75 U/L — ABNORMAL HIGH (ref 15–41)
Albumin: 4.8 g/dL (ref 3.5–5.0)
Alkaline Phosphatase: 76 U/L (ref 38–126)
Anion gap: 8 (ref 5–15)
BUN: 9 mg/dL (ref 8–23)
CO2: 27 mmol/L (ref 22–32)
Calcium: 9.7 mg/dL (ref 8.9–10.3)
Chloride: 102 mmol/L (ref 98–111)
Creatinine, Ser: 0.8 mg/dL (ref 0.44–1.00)
GFR, Estimated: >60 mL/min (ref >60)
Glucose, Bld: 156 mg/dL — ABNORMAL HIGH (ref 70–99)
Potassium: 3.7 mmol/L (ref 3.5–5.1)
Sodium: 137 mmol/L (ref 135–145)
Total Bilirubin: 1.9 mg/dL — ABNORMAL HIGH (ref 0.3–1.2)
Total Protein: 8 g/dL (ref 6.5–8.1)

## 2021-10-22 MED ORDER — IOHEXOL 300 MG/ML  SOLN
100.0000 mL | Freq: Once | INTRAMUSCULAR | Status: AC | PRN
  Administered 2021-10-22: 100 mL via INTRAVENOUS

## 2021-10-23 LAB — CEA: CEA: 7.5 ng/mL — ABNORMAL HIGH (ref 0.0–4.7)

## 2021-10-28 NOTE — Progress Notes (Signed)
[name], [place] [address]   CLINIC:  Medical Oncology/Hematology  PCP:  Fayrene Helper, MD [address] / [place] [address] [phone]   REASON FOR VISIT:  Follow-up for stage III transverse colon adenocarcinoma  PRIOR THERAPY:  1. Laparoscopic right colectomy on 01/09/2019. 2. FOLFOX x 12 cycles from 03/01/2019 to 08/22/2019.  NGS Results: not done  CURRENT THERAPY: surveillance  BRIEF ONCOLOGIC HISTORY:  Oncology History  Colon cancer ([place])  01/09/2019 Initial Diagnosis   Colon cancer ([place])   03/01/2019 -  Chemotherapy   The patient had palonosetron (ALOXI) injection 0.25 mg, 0.25 mg, Intravenous,  Once, 12 of 12 cycles Administration: 0.25 mg (03/01/2019), 0.25 mg (03/15/2019), 0.25 mg (03/27/2019), 0.25 mg (04/10/2019), 0.25 mg (05/09/2019), 0.25 mg (05/23/2019), 0.25 mg (06/06/2019), 0.25 mg (06/20/2019), 0.25 mg (07/25/2019), 0.25 mg (07/11/2019), 0.25 mg (08/08/2019), 0.25 mg (08/22/2019) pegfilgrastim (NEULASTA) injection 6 mg, 6 mg, Subcutaneous, Once, 8 of 8 cycles Administration: 6 mg (05/11/2019), 6 mg (05/25/2019), 6 mg (06/08/2019), 6 mg (06/22/2019), 6 mg (07/13/2019), 6 mg (07/27/2019), 6 mg (08/10/2019), 6 mg (08/24/2019) leucovorin 600 mg in dextrose 5 % 250 mL infusion, 640 mg, Intravenous,  Once, 12 of 12 cycles Administration: 600 mg (03/01/2019), 600 mg (03/15/2019), 640 mg (03/27/2019), 640 mg (04/10/2019), 640 mg (05/09/2019), 640 mg (05/23/2019), 640 mg (06/06/2019), 640 mg (06/20/2019), 640 mg (07/25/2019), 640 mg (07/11/2019), 640 mg (08/08/2019), 640 mg (08/22/2019) oxaliplatin (ELOXATIN) 135 mg in dextrose 5 % 500 mL chemo infusion, 85 mg/m2 = 135 mg, Intravenous,  Once, 10 of 10 cycles Dose modification: 68 mg/m2 (80 % of original dose 85 mg/m2, Cycle 5, Reason: Provider Judgment), 68 mg/m2 (original dose 85 mg/m2, Cycle 6, Reason: Provider Judgment), 51 mg/m2 (60 % of original dose 85 mg/m2, Cycle 10, Reason: Other (see comments),  Comment: neuropathy) Administration: 135 mg (03/01/2019), 135 mg (03/15/2019), 135 mg (03/27/2019), 135 mg (04/10/2019), 110 mg (05/09/2019), 110 mg (05/23/2019), 110 mg (06/06/2019), 110 mg (06/20/2019), 80 mg (07/25/2019), 110 mg (07/11/2019) fluorouracil (ADRUCIL) chemo injection 650 mg, 400 mg/m2 = 650 mg, Intravenous,  Once, 12 of 12 cycles Administration: 650 mg (03/01/2019), 650 mg (03/15/2019), 650 mg (03/27/2019), 650 mg (04/10/2019), 650 mg (05/09/2019), 650 mg (05/23/2019), 650 mg (06/06/2019), 650 mg (06/20/2019), 650 mg (07/25/2019), 650 mg (07/11/2019), 650 mg (08/08/2019), 650 mg (08/22/2019) fluorouracil (ADRUCIL) 3,850 mg in sodium chloride 0.9 % 73 mL chemo infusion, 2,400 mg/m2 = 3,850 mg, Intravenous, 1 Day/Dose, 12 of 12 cycles Administration: 3,850 mg (03/01/2019), 3,850 mg (03/15/2019), 3,850 mg (03/27/2019), 3,850 mg (04/10/2019), 3,850 mg (05/09/2019), 3,850 mg (05/23/2019), 3,850 mg (06/06/2019), 3,850 mg (06/20/2019), 3,850 mg (07/25/2019), 3,850 mg (07/11/2019), 3,850 mg (08/08/2019), 3,850 mg (08/22/2019)   for chemotherapy treatment.       CANCER STAGING: Cancer Staging No matching staging information was found for the patient.  INTERVAL HISTORY:  [name], a 65 y.o. female, returns for routine follow-up of her stage III transverse colon adenocarcinoma. Andrea Simon was last seen on 04/21/2021.   Today she reports feeling good. She reports stable neuropathy in her feet and denies any associated pain. She denies any vaginal spotting or bleeding.   REVIEW OF SYSTEMS:  Review of Systems  Constitutional:  Negative for appetite change and fatigue (80%).  Genitourinary:  Negative for vaginal bleeding.   Neurological:  Positive for numbness (neuropathy in feet).  All other systems reviewed and are negative.  PAST MEDICAL/SURGICAL HISTORY:  Past Medical  History:  Diagnosis Date   Cancer ([place]) 11/2018   Phreesia 09/16/2020   Depression, major, single episode, moderate ([place]) 09/24/2020   Score of 5  in 12/2020 on Wellbutrin   Hyperlipidemia    Insomnia due to anxiety and fear 01/27/2019   Multinodular goiter    Prediabetes    Past Surgical History:  Procedure Laterality Date   BIOPSY  12/07/2018   Procedure: BIOPSY;  Surgeon: Andrea Houston, MD;  [facility];  Service: Endoscopy;;  sigmoid colon   COLON RESECTION N/A 01/09/2019   Procedure: LAPAROSCOPIC RIGHT HEMICOLECTOMY;  Surgeon: Andrea Cagey, MD;  Location: [facility];  Service: General;  Laterality: N/A;   COLON SURGERY N/A    Phreesia 09/16/2020   COLONOSCOPY N/A 12/07/2018   Procedure: COLONOSCOPY;  Surgeon: Andrea Houston, MD;  [facility];  Service: Endoscopy;  Laterality: N/A;  12:45   COLONOSCOPY WITH PROPOFOL N/A 10/16/2020   Procedure: COLONOSCOPY WITH PROPOFOL;  Surgeon: Andrea Houston, MD;  [facility];  Service: Endoscopy;  Laterality: N/A;  [place] OF UTERUS  2006   FNA thyroid  2011 and 2019   benign   POLYPECTOMY  12/07/2018   Procedure: POLYPECTOMY;  Surgeon: Andrea Houston, MD;  [facility];  Service: Endoscopy;;  splenic flexure (CS x1)   POLYPECTOMY  10/16/2020   Procedure: POLYPECTOMY;  Surgeon: Andrea Houston, MD;  [facility];  Service: Endoscopy;;   PORTACATH PLACEMENT Left 02/15/2019   Procedure: INSERTION PORT-A-CATH;  Surgeon: Andrea Cagey, MD;  Location: [facility];  Service: General;  Laterality: Left;   TUBAL LIGATION  1992    SOCIAL HISTORY:  Social History   Socioeconomic History   Marital status: Married    Spouse name: Not on file   Number of children: 1   Years of education: Not on file   Highest education level: Not on file  Occupational History   Occupation: Theme park manager - [employer]   Tobacco Use   Smoking status: Every Day    Packs/day: 0.75    Years: 45.00    Pack years: 33.75    Types: Cigarettes   Smokeless tobacco: Never  Vaping Use   Vaping Use: Never used  Substance and  Sexual Activity   Alcohol use: Yes    Alcohol/week: 0.0 standard drinks    Comment: occasionally   Drug use: No   Sexual activity: Yes  Other Topics Concern   Not on file  Social History Narrative   Not on file   Social Determinants of Health   Financial Resource Strain: Not on file  Food Insecurity: Not on file  Transportation Needs: Not on file  Physical Activity: Not on file  Stress: Not on file  Social Connections: Not on file  Intimate Partner Violence: Not on file    FAMILY HISTORY:  Family History  Problem Relation Age of Onset   Hypertension Mother        AAA   Coronary artery disease Mother    Hyperlipidemia Mother    Cancer Mother        lung   Hypertension Father    Cancer Brother     CURRENT MEDICATIONS:  Current Outpatient Medications  Medication Sig Dispense Refill   amLODipine (NORVASC) 2.5 MG tablet Take 1 tablet by mouth once daily 30 tablet 0   amLODipine (NORVASC) 2.5 MG tablet Take 1 tablet (2.5 mg total) by mouth  daily. 90 tablet 3   aspirin EC 81 MG tablet Take 1 tablet (81 mg total) by mouth every evening. 30 tablet 11   blood glucose meter kit and supplies Dispense based on patient and insurance preference. Once daily testing DX E11.9 1 each 0   Calcium Carbonate-Vitamin D 600-400 MG-UNIT tablet Take 1 tablet by mouth daily.      glucose blood test strip Use as instructed once daily dx e11.9 100 each 5   Lancets 30G MISC Once daily testing dx e11.9 100 each 5   metFORMIN (GLUCOPHAGE) 500 MG tablet Take 1 tablet (500 mg total) by mouth daily with breakfast. 90 tablet 3   rosuvastatin (CRESTOR) 10 MG tablet TAKE 1 TABLET BY MOUTH  DAILY 90 tablet 0   rosuvastatin (CRESTOR) 10 MG tablet Take 1 tablet (10 mg total) by mouth daily. 90 tablet 3   No current facility-administered medications for this visit.   Facility-Administered Medications Ordered in Other Visits  Medication Dose Route Frequency Provider Last Rate Last Admin   sodium chloride  flush (NS) 0.9 % injection 10 mL  10 mL Intracatheter PRN Derek Jack, MD   10 mL at 04/26/19 0928   sodium chloride flush (NS) 0.9 % injection 10 mL  10 mL Intravenous PRN Derek Jack, MD   10 mL at 01/09/21 1430    ALLERGIES:  No Known Allergies  PHYSICAL EXAM:  Performance status (ECOG): 1 - Symptomatic but completely ambulatory  Vitals:   10/29/21 1538  BP: 131/83  Pulse: 94  Resp: 18  Temp: 98.8 F (37.1 C)  SpO2: 100%   Wt Readings from Last 3 Encounters:  10/29/21 146 lb 4.8 oz (66.4 kg)  10/17/21 149 lb (67.6 kg)  07/29/21 143 lb (64.9 kg)   Physical Exam Vitals reviewed.  Constitutional:      Appearance: Normal appearance.  Cardiovascular:     Rate and Rhythm: Normal rate and regular rhythm.     Pulses: Normal pulses.     Heart sounds: Normal heart sounds.  Pulmonary:     Effort: Pulmonary effort is normal.     Breath sounds: Normal breath sounds.  Abdominal:     Palpations: Abdomen is soft. There is no hepatomegaly, splenomegaly or mass.     Tenderness: There is no abdominal tenderness.  Neurological:     General: No focal deficit present.     Mental Status: She is alert and oriented to person, place, and time.  Psychiatric:        Mood and Affect: Mood normal.        Behavior: Behavior normal.     LABORATORY DATA:  I have reviewed the labs as listed.  CBC Latest Ref Rng & Units 10/22/2021 10/13/2021 07/22/2021  WBC 4.0 - 10.5 K/uL 4.6 5.8 6.9  Hemoglobin 12.0 - 15.0 g/dL 14.8 14.0 13.9  Hematocrit 36.0 - 46.0 % 41.4 39.0 41.1  Platelets 150 - 400 K/uL 196 193 237   CMP Latest Ref Rng & Units 10/22/2021 10/13/2021 07/22/2021  Glucose 70 - 99 mg/dL 156(H) 163(H) 97  BUN 8 - 23 mg/dL 9 11 7(L)  Creatinine 0.44 - 1.00 mg/dL 0.80 0.84 0.69  Sodium 135 - 145 mmol/L 137 140 136  Potassium 3.5 - 5.1 mmol/L 3.7 3.8 3.4(L)  Chloride 98 - 111 mmol/L 102 102 102  CO2 22 - 32 mmol/L '27 23 26  ' Calcium 8.9 - 10.3 mg/dL 9.7 10.0 9.2  Total Protein  6.5 - 8.1 g/dL 8.0 7.1  7.9  Total Bilirubin 0.3 - 1.2 mg/dL 1.9(H) 1.4(H) 1.4(H)  Alkaline Phos 38 - 126 U/L 76 80 77  AST 15 - 41 U/L 75(H) 44(H) 48(H)  ALT 0 - 44 U/L 65(H) 39(H) 44    DIAGNOSTIC IMAGING:  I have independently reviewed the scans and discussed with the patient. CT CHEST ABDOMEN PELVIS W CONTRAST  Result Date: 10/22/2021 CLINICAL DATA:  A 65 year old female presents for follow-up of colon cancer. Post systemic therapy and colonic resection. Initial diagnosis in January 2020. EXAM: CT CHEST, ABDOMEN, AND PELVIS WITH CONTRAST TECHNIQUE: Multidetector CT imaging of the chest, abdomen and pelvis was performed following the standard protocol during bolus administration of intravenous contrast. CONTRAST:  158m OMNIPAQUE IOHEXOL 300 MG/ML  SOLN COMPARISON:  April 15, 2021. FINDINGS: CT CHEST FINDINGS Cardiovascular: LEFT-sided Port-A-Cath terminates at the mid superior vena cava. Scattered aortic atherosclerosis. No aneurysmal dilation of the thoracic aorta. Normal heart size without substantial pericardial effusion. Normal caliber of the central pulmonary vessels. Mediastinum/Nodes: Greater than 2 cm RIGHT thyroid lesion previously sampled in December of 2019. No thoracic inlet or axillary lymphadenopathy. Mildly patulous esophagus. No mediastinal or hilar lymphadenopathy. Lungs/Pleura: Signs of paraseptal and centrilobular pulmonary emphysema. Accessory fissure in the RIGHT lower lobe. Emphysematous changes are mild. No consolidation, effusion, suspicious nodule or gross airway abnormality. Musculoskeletal: Please see below for full musculoskeletal details. CT ABDOMEN PELVIS FINDINGS Hepatobiliary: Severe hepatic steatosis largely similar to the prior exam. No focal, suspicious hepatic lesion. Portal vein is patent. Hepatic veins are patent. Geographic fatty sparing in the gallbladder fossa and along the RIGHT hepatic lobe. Cholelithiasis. Large gallstone in the fundus of the gallbladder  proximally 1 cm size. No intra or extrahepatic biliary duct distension. Pancreas: Normal, without mass, inflammation or ductal dilatation. Spleen: Normal. Adrenals/Urinary Tract: Adrenal glands are unremarkable. Symmetric renal enhancement. No sign of hydronephrosis. No suspicious renal lesion or perinephric stranding. Urinary bladder is grossly unremarkable. Stomach/Bowel: No acute gastrointestinal process. Post RIGHT hemicolectomy. Vascular/Lymphatic: Aortic atherosclerosis. No sign of aneurysm. Smooth contour of the IVC. There is no gastrohepatic or hepatoduodenal ligament lymphadenopathy. No retroperitoneal or mesenteric lymphadenopathy. No pelvic sidewall lymphadenopathy. Reproductive: Large LEFT lower uterine leiomyoma unchanged. No adnexal masses. Heterogeneity of the uterus but with focal thickening of the endometrium (image 80/6) up to 13 mm on sagittal images. Posterior uterine leiomyomata as well which are smaller and also unchanged. Other: No ascites no peritoneal nodularity. Musculoskeletal: No acute or significant osseous findings. IMPRESSION: Post RIGHT hemicolectomy. No evidence of metastatic disease in the chest, abdomen or pelvis. Endometrial thickening while nonspecific is abnormal in a patient of this age and heterogeneous. Suggest referral to gynecology as endometrial neoplasm cannot be excluded. Large uterine leiomyomata as before. Severe hepatic steatosis with geographic fatty sparing. Cholelithiasis without evidence of acute cholecystitis. Aortic Atherosclerosis (ICD10-I70.0). These results will be called to the ordering clinician or representative by the Radiologist Assistant, and communication documented in the PACS or zVision Dashboard. Electronically Signed   By: GZetta BillsM.D.   On: 10/22/2021 16:21     ASSESSMENT:  1.  Stage III (PT4BPN1A) transverse colon adenocarcinoma: -Right colectomy on 01/09/2019, 1/24 lymph nodes positive, no tumor deposits, no perineural invasion, grade  2, tumor extending through serosa into adherent omentum, margins negative. -PET scan on 01/31/2019 shows focal hypermetabolic activity identified in the ileocecal anastomosis with SUV 9. -12 cycles of FOLFOX from 03/01/2019 through 08/22/2019. -CTAP on 03/26/2019 did not show any evidence of recurrence or metastatic disease. -CTAP from 10/03/2020  shows no evidence of recurrence or metastatic disease.   2.  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  MSI was high by PCR.  BRAF testing was negative. -MLH1 hyper methylation was present.  Thought to be sporadic cancer.   3.  Smoking history: -40+ pack year smoker. -CT low-dose on 12/05/2019 was lung RADS 2 with emphysema. -CT chest on 12/25/2020 was lung RADS 2.   PLAN:  1.  Stage III (PT4BPN1A) transverse colon adenocarcinoma: -Last colonoscopy on 10/16/2020 did not show any major abnormalities. - No change in bowel habits or bleeding per rectum. - Reviewed CT CAP from 10/22/2021 which did not show any evidence of recurrence or metastatic disease. - Incidental endometrial thickening, nonspecific.  She does not have any bleeding or spotting. - I have offered her GYN referral.  She said she will follow-up with Dr. Moshe Cipro next week as she is seeing her. - We reviewed the blood work from 10/22/2021.  CEA was elevated at 7.5.  Her CEA has been elevated anywhere between 6.0-7.7. - She is a smoker and has fatty infiltration of the liver which could be contributing to mildly elevated CEA levels and liver enzyme abnormalities.  She was reportedly started on metformin 500 mg daily a week ago for elevated hemoglobin A1c. - Recommend follow-up in 6 months with repeat labs including CEA. - We will plan to repeat imaging in 1 year.   2.  Peripheral neuropathy: -Mild neuropathy with numbness in the feet has been stable.  No neuropathic pains.   3.  Vitamin D deficiency: -Continue vitamin D supplements.  Last vitamin D was 64.   4.  Smoking  history: -CT chest on 10/22/2021 did not show any lung nodules.   Orders placed this encounter:  No orders of the defined types were placed in this encounter.    Derek Jack, MD [place] [phone]   I, Thana Ates, am acting as a scribe for Dr. Derek Jack.  I, Derek Jack MD, have reviewed the above documentation for accuracy and completeness, and I agree with the above.

## 2021-10-29 ENCOUNTER — Other Ambulatory Visit: Payer: Self-pay

## 2021-10-29 ENCOUNTER — Inpatient Hospital Stay (HOSPITAL_BASED_OUTPATIENT_CLINIC_OR_DEPARTMENT_OTHER): Payer: 59 | Admitting: Hematology

## 2021-10-29 VITALS — BP 131/83 | HR 94 | Temp 98.8°F | Resp 18 | Wt 146.3 lb

## 2021-10-29 DIAGNOSIS — E559 Vitamin D deficiency, unspecified: Secondary | ICD-10-CM | POA: Diagnosis not present

## 2021-10-29 DIAGNOSIS — D259 Leiomyoma of uterus, unspecified: Secondary | ICD-10-CM | POA: Diagnosis not present

## 2021-10-29 DIAGNOSIS — Z8249 Family history of ischemic heart disease and other diseases of the circulatory system: Secondary | ICD-10-CM | POA: Diagnosis not present

## 2021-10-29 DIAGNOSIS — R9389 Abnormal findings on diagnostic imaging of other specified body structures: Secondary | ICD-10-CM | POA: Diagnosis not present

## 2021-10-29 DIAGNOSIS — C184 Malignant neoplasm of transverse colon: Secondary | ICD-10-CM | POA: Diagnosis present

## 2021-10-29 DIAGNOSIS — G629 Polyneuropathy, unspecified: Secondary | ICD-10-CM | POA: Diagnosis not present

## 2021-10-29 DIAGNOSIS — Z8349 Family history of other endocrine, nutritional and metabolic diseases: Secondary | ICD-10-CM | POA: Diagnosis not present

## 2021-10-29 DIAGNOSIS — Z801 Family history of malignant neoplasm of trachea, bronchus and lung: Secondary | ICD-10-CM | POA: Diagnosis not present

## 2021-10-29 DIAGNOSIS — Z79899 Other long term (current) drug therapy: Secondary | ICD-10-CM | POA: Diagnosis not present

## 2021-10-29 DIAGNOSIS — E079 Disorder of thyroid, unspecified: Secondary | ICD-10-CM | POA: Diagnosis not present

## 2021-10-29 DIAGNOSIS — K760 Fatty (change of) liver, not elsewhere classified: Secondary | ICD-10-CM | POA: Diagnosis not present

## 2021-10-29 DIAGNOSIS — Z809 Family history of malignant neoplasm, unspecified: Secondary | ICD-10-CM | POA: Diagnosis not present

## 2021-10-29 DIAGNOSIS — I700 Atherosclerosis of aorta: Secondary | ICD-10-CM | POA: Diagnosis not present

## 2021-10-29 DIAGNOSIS — F17210 Nicotine dependence, cigarettes, uncomplicated: Secondary | ICD-10-CM | POA: Diagnosis not present

## 2021-10-29 DIAGNOSIS — E119 Type 2 diabetes mellitus without complications: Secondary | ICD-10-CM | POA: Diagnosis not present

## 2021-10-29 DIAGNOSIS — E785 Hyperlipidemia, unspecified: Secondary | ICD-10-CM | POA: Diagnosis not present

## 2021-10-29 DIAGNOSIS — K8020 Calculus of gallbladder without cholecystitis without obstruction: Secondary | ICD-10-CM | POA: Diagnosis not present

## 2021-10-29 NOTE — Patient Instructions (Addendum)
[place] at [facility] Discharge Instructions  You were seen and examined today by Dr. Delton Coombes. He reviewed your most recent scan and it shows endometrial thickening. He recommends that you have evaluation with Gynecologist concerning this. It also showed that you have a fatty liver. He said that your metformin may help with weight loss that can intern help with fatty liver. Please keep follow up as scheduled.   Thank you for choosing [place] at [facility] to provide your oncology and hematology care.  To afford each patient quality time with our provider, please arrive at least 15 minutes before your scheduled appointment time.   If you have a lab appointment with the [facility] please come in thru the Main Entrance and check in at the main information desk.  You need to re-schedule your appointment should you arrive 10 or more minutes late.  We strive to give you quality time with our providers, and arriving late affects you and other patients whose appointments are after yours.  Also, if you no show three or more times for appointments you may be dismissed from the clinic at the providers discretion.     Again, thank you for choosing [facility].  Our hope is that these requests will decrease the amount of time that you wait before being seen by our physicians.       _____________________________________________________________  Should you have questions after your visit to [facility], please contact our office at [phone] and follow the prompts.  Our office hours are 8:00 a.m. and 4:30 p.m. Monday - Friday.  Please note that voicemails left after 4:00 p.m. may not be returned until the following business day.  We are closed weekends and major holidays.  You do have access to a nurse 24-7, just call the main number to the clinic [phone] and do not press any options, hold on the line and a nurse will  answer the phone.    For prescription refill requests, have your pharmacy contact our office and allow 72 hours.    Due to Covid, you will need to wear a mask upon entering the hospital. If you do not have a mask, a mask will be given to you at the Main Entrance upon arrival. For doctor visits, patients may have 1 support person age 66 or older with them. For treatment visits, patients can not have anyone with them due to social distancing guidelines and our immunocompromised population.

## 2021-10-29 NOTE — Progress Notes (Signed)
Labs ordered in this encounter per Dr. Katragadda's orders. 

## 2021-10-30 ENCOUNTER — Ambulatory Visit (HOSPITAL_COMMUNITY): Payer: 59

## 2021-11-01 ENCOUNTER — Other Ambulatory Visit: Payer: Self-pay | Admitting: Family Medicine

## 2021-11-01 DIAGNOSIS — I10 Essential (primary) hypertension: Secondary | ICD-10-CM

## 2021-11-10 ENCOUNTER — Ambulatory Visit (HOSPITAL_COMMUNITY)
Admission: RE | Admit: 2021-11-10 | Discharge: 2021-11-10 | Disposition: A | Discharge status: Home / Self Care | Payer: 59 | Source: Ambulatory Visit | Attending: Family Medicine | Admitting: Family Medicine

## 2021-11-10 ENCOUNTER — Other Ambulatory Visit: Payer: Self-pay

## 2021-11-10 DIAGNOSIS — Z1231 Encounter for screening mammogram for malignant neoplasm of breast: Secondary | ICD-10-CM | POA: Insufficient documentation

## 2021-11-12 ENCOUNTER — Other Ambulatory Visit: Payer: Self-pay

## 2021-11-12 ENCOUNTER — Ambulatory Visit: Payer: 59

## 2021-11-20 LAB — HM DIABETES EYE EXAM

## 2021-11-28 ENCOUNTER — Other Ambulatory Visit: Payer: Self-pay | Admitting: Family Medicine

## 2021-11-28 DIAGNOSIS — I10 Essential (primary) hypertension: Secondary | ICD-10-CM

## 2021-12-01 ENCOUNTER — Other Ambulatory Visit: Payer: Self-pay

## 2021-12-01 ENCOUNTER — Encounter: Payer: 59 | Attending: Family Medicine | Admitting: Nutrition

## 2021-12-01 VITALS — Ht 63.0 in | Wt 146.0 lb

## 2021-12-01 DIAGNOSIS — E782 Mixed hyperlipidemia: Secondary | ICD-10-CM

## 2021-12-01 DIAGNOSIS — E1149 Type 2 diabetes mellitus with other diabetic neurological complication: Secondary | ICD-10-CM

## 2021-12-01 DIAGNOSIS — I10 Essential (primary) hypertension: Secondary | ICD-10-CM | POA: Diagnosis not present

## 2021-12-01 NOTE — Progress Notes (Signed)
Medical Nutrition Therapy  Appointment Start time:  9417  Appointment End time:  63  Primary concerns today: Diabetes Type 2  Referral diagnosis: E11.8 Preferred learning style: . Read  Learning readiness: Contemplating   NUTRITION ASSESSMENT  Got diagnosed  6 months ago. Doesn't know what to eat or not eat. Has been avoiding carbs.  Not consistently checking blood sugars. Started on Metformin 500 mg daily. Drinks regular sodas.  H/o of colon cancer  Anthropometrics  Wt Readings from Last 3 Encounters:  12/01/21 146 lb (66.2 kg)  10/29/21 146 lb 4.8 oz (66.4 kg)  10/17/21 149 lb (67.6 kg)   Ht Readings from Last 3 Encounters:  12/01/21 5\' 3"  (1.6 m)  10/17/21 5\' 3"  (1.6 m)  07/11/21 5\' 3"  (1.6 m)   Body mass index is 25.86 kg/m. @BMIFA @ Facility age limit for growth percentiles is 20 years. Facility age limit for growth percentiles is 20 years.    Clinical Medical Hx: Hyperlipidemia, HTN Medications: Metformin 500 mg once a day Labs:  Lab Results  Component Value Date   HGBA1C 7.6 (H) 10/13/2021   CMP Latest Ref Rng & Units 10/22/2021 10/13/2021 07/22/2021  Glucose 70 - 99 mg/dL 156(H) 163(H) 97  BUN 8 - 23 mg/dL 9 11 7(L)  Creatinine 0.44 - 1.00 mg/dL 0.80 0.84 0.69  Sodium 135 - 145 mmol/L 137 140 136  Potassium 3.5 - 5.1 mmol/L 3.7 3.8 3.4(L)  Chloride 98 - 111 mmol/L 102 102 102  CO2 22 - 32 mmol/L 27 23 26   Calcium 8.9 - 10.3 mg/dL 9.7 10.0 9.2  Total Protein 6.5 - 8.1 g/dL 8.0 7.1 7.9  Total Bilirubin 0.3 - 1.2 mg/dL 1.9(H) 1.4(H) 1.4(H)  Alkaline Phos 38 - 126 U/L 76 80 77  AST 15 - 41 U/L 75(H) 44(H) 48(H)  ALT 0 - 44 U/L 65(H) 39(H) 44   Lipid Panel     Component Value Date/Time   CHOL 147 10/13/2021 0935   TRIG 111 10/13/2021 0935   HDL 39 (L) 10/13/2021 0935   CHOLHDL 3.8 10/13/2021 0935   CHOLHDL 4.8 05/23/2019 0758   VLDL 36 05/23/2019 0758   LDLCALC 88 10/13/2021 0935   LABVLDL 20 10/13/2021 0935   Notable Signs/Symptoms:  none  Lifestyle & Dietary Hx Lives with her husband. She cooks. Eats 2 meals a day. Skips breakfast. Checks blood sugars sometimes at night. BS at night 120-140's.  Estimated daily fluid intake: 24 oz Supplements: Calcium, VIt D 3 Sleep: 6 hours Stress / self-care: none Current average weekly physical activity: ADL  24-Hr Dietary Recall First Meal:  530 am Mellow yellow 12 oz Snack: 9-10 am banana and pear Second Meal: skips Snack:  Third Meal: pasta, spaghetti, Snack:  Beverages: soda, some water  Estimated Energy Needs Calories: 1500-1800 Carbohydrate: 170g Protein: 1112g Fat: 42g   NUTRITION DIAGNOSIS  NB-1.1 Food and nutrition-related knowledge deficit As related to Diabetes Type 2.  As evidenced by A1C 7.6%.   NUTRITION INTERVENTION  Nutrition education (E-1) on the following topics:  Nutrition and Diabetes education provided on My Plate, CHO counting, meal planning, portion sizes, timing of meals, avoiding snacks between meals unless having a low blood sugar, target ranges for A1C and blood sugars, signs/symptoms and treatment of hyper/hypoglycemia, monitoring blood sugars, taking medications as prescribed, benefits of exercising 30 minutes per day and prevention of complications of DM. Lifestyle Medicine - Whole Food, Plant Predominant Nutrition is highly recommended: Eat Plenty of vegetables, Mushrooms, fruits, Legumes, Whole Grains,  Nuts, seeds in lieu of processed meats, processed snacks/pastries red meat, poultry, eggs.    -It is better to avoid simple carbohydrates including: Cakes, Sweet Desserts, Ice Cream, Soda (diet and regular), Sweet Tea, Candies, Chips, Cookies, Store Bought Juices, Alcohol in Excess of  1-2 drinks a day, Lemonade,  Artificial Sweeteners, Doughnuts, Coffee Creamers, "Sugar-free" Products, etc, etc.  This is not a complete list.....  Exercise: If you are able: 30 -60 minutes a day ,4 days a week, or 150 minutes a week.  The longer the  better.  Combine stretch, strength, and aerobic activities.  If you were told in the past that you have high risk for cardiovascular diseases, you may seek evaluation by your heart doctor prior to initiating moderate to intense exercise programs.  Handouts Provided Include  Nutrition lifestyle Plant based myplate Meal Plan Card  Learning Style & Readiness for Change Teaching method utilized: Visual & Auditory  Demonstrated degree of understanding via: Teach Back  Barriers to learning/adherence to lifestyle change: none  Goals Established by Pt Goals  Eat three meals per day-eat 2-3 carb choices per meal Be more conscious of eating more fresh fruits and vegetables. Increase plant based foods Cut out drinking soda Drink only water Test blood sugars twice a day Get A1C down to 6.5%   MONITORING & EVALUATION Dietary intake, weekly physical activity, and blood sugars in 1 month.  Next Steps  Patient is to work on better eating habits and cut out sodas.Marland Kitchen

## 2021-12-01 NOTE — Patient Instructions (Signed)
Goals  Eat three meals per day-eat 2-3 carb choices per meal Be more conscious of eating more fresh fruits and vegetables. Increase plant based foods Cut out drinking soda Drink only water Test blood sugars twice a day Get A1C down to 6.5%

## 2021-12-17 ENCOUNTER — Encounter: Payer: Self-pay | Admitting: Nutrition

## 2022-01-12 ENCOUNTER — Other Ambulatory Visit: Payer: Self-pay | Admitting: Family Medicine

## 2022-01-12 DIAGNOSIS — E1149 Type 2 diabetes mellitus with other diabetic neurological complication: Secondary | ICD-10-CM

## 2022-01-23 ENCOUNTER — Ambulatory Visit (INDEPENDENT_AMBULATORY_CARE_PROVIDER_SITE_OTHER): Payer: 59 | Admitting: Family Medicine

## 2022-01-23 ENCOUNTER — Other Ambulatory Visit (INDEPENDENT_AMBULATORY_CARE_PROVIDER_SITE_OTHER): Payer: 59

## 2022-01-23 ENCOUNTER — Other Ambulatory Visit: Payer: Self-pay

## 2022-01-23 VITALS — BP 142/84 | HR 67 | Resp 17 | Ht 63.0 in | Wt 140.0 lb

## 2022-01-23 DIAGNOSIS — E1149 Type 2 diabetes mellitus with other diabetic neurological complication: Secondary | ICD-10-CM | POA: Diagnosis not present

## 2022-01-23 DIAGNOSIS — F17200 Nicotine dependence, unspecified, uncomplicated: Secondary | ICD-10-CM

## 2022-01-23 DIAGNOSIS — E782 Mixed hyperlipidemia: Secondary | ICD-10-CM

## 2022-01-23 DIAGNOSIS — R9389 Abnormal findings on diagnostic imaging of other specified body structures: Secondary | ICD-10-CM

## 2022-01-23 DIAGNOSIS — I10 Essential (primary) hypertension: Secondary | ICD-10-CM | POA: Diagnosis not present

## 2022-01-23 LAB — POCT GLYCOSYLATED HEMOGLOBIN (HGB A1C): HbA1c, POC (controlled diabetic range): 6.2 % (ref 0.0–7.0)

## 2022-01-23 MED ORDER — AMLODIPINE BESYLATE 5 MG PO TABS: 5.0000 mg | ORAL_TABLET | Freq: Every day | ORAL | 1 refills | Status: DC

## 2022-01-23 NOTE — Assessment & Plan Note (Signed)
Asked:confirms currently smokes cigarettes 6 on a good day Assess: Unwilling to set a quit date, but is cutting back Advise: needs to QUIT to reduce risk of cancer, cardio and cerebrovascular disease Assist: counseled for 5 minutes and literature provided Arrange: follow up in 2 to 4 months

## 2022-01-23 NOTE — Patient Instructions (Signed)
F/U in 6 to 7 weeks re evaluate blood pressure, call if you need me sooner  Amlodipine dose is increased to 5 mg one daily, OK to take TWO 2.5 mg tablets together until new dose  delivered Nurse pls contact mail order to ascertain that they are aware of dose increase  You are referred urgently to [facility], you should get appt info  by mid next week  Glyco HB in office today  Please work on stopping smoking  Thanks for choosing [facility], we consider it a privelige to serve you.

## 2022-01-23 NOTE — Assessment & Plan Note (Signed)
uncontrolled inc to 5 mg daily DASH diet and commitment to daily physical activity for a minimum of 30 minutes discussed and encouraged, as a part of hypertension management. The importance of attaining a healthy weight is also discussed.  BP/Weight 01/23/2022 12/01/2021 10/29/2021 10/17/2021 07/29/2021 07/22/2021 8/00/3491  Systolic BP 791 - 505 697 948 016 553  Diastolic BP 84 - 83 78 78 82 80  Wt. (Lbs) 140 146 146.3 149 143 - 146  BMI 24.8 25.86 25.92 26.39 25.33 - 25.86

## 2022-01-25 ENCOUNTER — Encounter: Payer: Self-pay | Admitting: Family Medicine

## 2022-01-25 NOTE — Assessment & Plan Note (Signed)
Andrea Simon is reminded of the importance of commitment to daily physical activity for 30 minutes or more, as able and the need to limit carbohydrate intake to 30 to 60 grams per meal to help with blood sugar control.   The need to take medication as prescribed, test blood sugar as directed, and to call between visits if there is a concern that blood sugar is uncontrolled is also discussed.   Andrea Simon is reminded of the importance of daily foot exam, annual eye examination, and good blood sugar, blood pressure and cholesterol control. Controlled, no change in medication   Diabetic Labs Latest Ref Rng & Units 01/23/2022 10/22/2021 10/17/2021 10/13/2021 07/22/2021  HbA1c 0.0 - 7.0 % 6.2 - - 7.6(H) -  Microalbumin Not Estab. ug/mL - - - - -  Micro/Creat Ratio 0 - 29 mg/g creat - - 15 - -  Chol 100 - 199 mg/dL - - - 147 -  HDL >39 mg/dL - - - 39(L) -  Calc LDL 0 - 99 mg/dL - - - 88 -  Triglycerides 0 - 149 mg/dL - - - 111 -  Creatinine 0.44 - 1.00 mg/dL - 0.80 - 0.84 0.69   BP/Weight 01/23/2022 12/01/2021 10/29/2021 10/17/2021 07/29/2021 07/22/2021 1/63/8466  Systolic BP 599 - 357 017 793 903 009  Diastolic BP 84 - 83 78 78 82 80  Wt. (Lbs) 140 146 146.3 149 143 - 146  BMI 24.8 25.86 25.92 26.39 25.33 - 25.86   Foot/eye exam completion dates Latest Ref Rng & Units 11/20/2021 11/08/2018  Eye Exam No Retinopathy No Retinopathy No Retinopathy  Foot Form Completion - - -

## 2022-01-25 NOTE — Assessment & Plan Note (Signed)
Thickened endometrium on recent imaging, needs urgent Gyne eval, cigarette smoker and personal h/o colon ca

## 2022-01-25 NOTE — Progress Notes (Signed)
Andrea Simon     MRN: [id number]      DOB: 07/30/1956   HPI Andrea Simon is here for follow up and re-evaluation of chronic medical conditions, medication management and review of any available recent lab and radiology data.  Preventive health is updated, specifically  Cancer screening and Immunization.   Questions or concerns regarding consultations or procedures which the PT has had in the interim are  addressed. The PT denies any adverse reactions to current medications since the last visit.  C/o thickened endometrium needs urgent gyne eval  ROS Denies recent fever or chills. Denies sinus pressure, nasal congestion, ear pain or sore throat. Denies chest congestion, productive cough or wheezing. Denies chest pains, palpitations and leg swelling Denies abdominal pain, nausea, vomiting,diarrhea or constipation.   Denies dysuria, frequency, hesitancy or incontinence. Denies joint pain, swelling and limitation in mobility. Denies headaches, seizures, numbness, or tingling. Denies depression, anxiety or insomnia. Denies skin break down or rash.   PE  BP (!) 142/84    Pulse 67    Resp 17    Ht 5\' 3"  (1.6 m)    Wt 140 lb (63.5 kg)    SpO2 96%    BMI 24.80 kg/m   Patient alert and oriented and in no cardiopulmonary distress.  HEENT: No facial asymmetry, EOMI,     Neck supple .  Chest: Clear to auscultation bilaterally.  CVS: S1, S2 no murmurs, no S3.Regular rate.  ABD: Soft non tender.   Ext: No edema  MS: Adequate ROM spine, shoulders, hips and knees.  Skin: Intact, no ulcerations or rash noted.  Psych: Good eye contact, normal affect. Memory intact not anxious or depressed appearing.  CNS: CN 2-12 intact, power,  normal throughout.no focal deficits noted.   Assessment & Plan  NICOTINE ADDICTION Asked:confirms currently smokes cigarettes 6 on a good day Assess: Unwilling to set a quit date, but is cutting back Advise: needs to QUIT to reduce risk of cancer,  cardio and cerebrovascular disease Assist: counseled for 5 minutes and literature provided Arrange: follow up in 2 to 4 months   Essential hypertension uncontrolled inc to 5 mg daily DASH diet and commitment to daily physical activity for a minimum of 30 minutes discussed and encouraged, as a part of hypertension management. The importance of attaining a healthy weight is also discussed.  BP/Weight 01/23/2022 12/01/2021 10/29/2021 10/17/2021 07/29/2021 07/22/2021 6/76/1950  Systolic BP 932 - 671 245 809 983 382  Diastolic BP 84 - 83 78 78 82 80  Wt. (Lbs) 140 146 146.3 149 143 - 146  BMI 24.8 25.86 25.92 26.39 25.33 - 25.86       Type 2 diabetes mellitus with neurological complications ([place]) Andrea Simon is reminded of the importance of commitment to daily physical activity for 30 minutes or more, as able and the need to limit carbohydrate intake to 30 to 60 grams per meal to help with blood sugar control.   The need to take medication as prescribed, test blood sugar as directed, and to call between visits if there is a concern that blood sugar is uncontrolled is also discussed.   Andrea Simon is reminded of the importance of daily foot exam, annual eye examination, and good blood sugar, blood pressure and cholesterol control. Controlled, no change in medication   Diabetic Labs Latest Ref Rng & Units 01/23/2022 10/22/2021 10/17/2021 10/13/2021 07/22/2021  HbA1c 0.0 - 7.0 % 6.2 - - 7.6(H) -  Microalbumin Not Estab. ug/mL - - - - -  Micro/Creat Ratio 0 - 29 mg/g creat - - 15 - -  Chol 100 - 199 mg/dL - - - 147 -  HDL >39 mg/dL - - - 39(L) -  Calc LDL 0 - 99 mg/dL - - - 88 -  Triglycerides 0 - 149 mg/dL - - - 111 -  Creatinine 0.44 - 1.00 mg/dL - 0.80 - 0.84 0.69   BP/Weight 01/23/2022 12/01/2021 10/29/2021 10/17/2021 07/29/2021 07/22/2021 1/54/0086  Systolic BP 761 - 950 932 671 245 809  Diastolic BP 84 - 83 78 78 82 80  Wt. (Lbs) 140 146 146.3 149 143 - 146  BMI 24.8 25.86 25.92 26.39 25.33 -  25.86   Foot/eye exam completion dates Latest Ref Rng & Units 11/20/2021 11/08/2018  Eye Exam No Retinopathy No Retinopathy No Retinopathy  Foot Form Completion - - -        Hyperlipemia Hyperlipidemia:Low fat diet discussed and encouraged.   Lipid Panel  Lab Results  Component Value Date   CHOL 147 10/13/2021   HDL 39 (L) 10/13/2021   LDLCALC 88 10/13/2021   TRIG 111 10/13/2021   CHOLHDL 3.8 10/13/2021     Controlled, no change in medication nees to increase exercise  Thickened endometrium Thickened endometrium on recent imaging, needs urgent Gyne eval, cigarette smoker and personal h/o colon ca

## 2022-01-25 NOTE — Assessment & Plan Note (Signed)
Hyperlipidemia:Low fat diet discussed and encouraged.   Lipid Panel  Lab Results  Component Value Date   CHOL 147 10/13/2021   HDL 39 (L) 10/13/2021   LDLCALC 88 10/13/2021   TRIG 111 10/13/2021   CHOLHDL 3.8 10/13/2021     Controlled, no change in medication nees to increase exercise

## 2022-02-03 ENCOUNTER — Other Ambulatory Visit: Payer: Self-pay

## 2022-02-03 ENCOUNTER — Other Ambulatory Visit (HOSPITAL_COMMUNITY)
Admission: RE | Admit: 2022-02-03 | Discharge: 2022-02-03 | Disposition: A | Discharge status: Home / Self Care | Payer: 59 | Source: Ambulatory Visit | Attending: Obstetrics & Gynecology | Admitting: Obstetrics & Gynecology

## 2022-02-03 ENCOUNTER — Encounter: Payer: Self-pay | Admitting: Obstetrics & Gynecology

## 2022-02-03 ENCOUNTER — Ambulatory Visit (INDEPENDENT_AMBULATORY_CARE_PROVIDER_SITE_OTHER): Payer: 59 | Admitting: Obstetrics & Gynecology

## 2022-02-03 VITALS — BP 121/80 | HR 87 | Ht 63.0 in | Wt 136.0 lb

## 2022-02-03 DIAGNOSIS — R9389 Abnormal findings on diagnostic imaging of other specified body structures: Secondary | ICD-10-CM | POA: Diagnosis present

## 2022-02-03 NOTE — Progress Notes (Signed)
Endometrial Biopsy Procedure Note  Pre-operative Diagnosis: 13 mm endometrium on CT scan(serendipitous finding no PMB)  Post-operative Diagnosis: same  Indications: 13 mm endometrium  Procedure Details   Urine pregnancy test was not done.  The risks (including infection, bleeding, pain, and uterine perforation) and benefits of the procedure were explained to the patient and Written informed consent was obtained.  Antibiotic prophylaxis against endocarditis was not indicated.   The patient was placed in the dorsal lithotomy position.  Bimanual exam showed the uterus to be in the neutral position.  A Graves' speculum inserted in the vagina, and the cervix prepped with povidone iodine.  Endocervical curettage with a Kevorkian curette was not performed.   A sharp tenaculum was applied to the anterior lip of the cervix for stabilization.  A sterile uterine sound was used to sound the uterus to a depth of 6.5 cm.  A Pipelle endometrial aspirator was used to sample the endometrium.  Sample was sent for pathologic examination.  Condition: Stable  Complications: None  Plan:  The patient was advised to call for any fever or for prolonged or severe pain or bleeding. She was advised to use OTC analgesics as needed for mild to moderate pain. She was advised to avoid vaginal intercourse for 48 hours or until the bleeding has completely stopped.  Attending Physician Documentation: I was present for or performed the following: endometrial biopsy   I will call [place] with the report and recommendations

## 2022-02-05 ENCOUNTER — Encounter: Payer: Self-pay | Admitting: Obstetrics & Gynecology

## 2022-02-05 LAB — SURGICAL PATHOLOGY

## 2022-02-09 ENCOUNTER — Encounter: Payer: Self-pay | Admitting: Obstetrics & Gynecology

## 2022-02-09 ENCOUNTER — Encounter: Payer: Self-pay | Admitting: Nutrition

## 2022-02-09 ENCOUNTER — Encounter: Payer: 59 | Attending: Family Medicine | Admitting: Nutrition

## 2022-02-09 ENCOUNTER — Other Ambulatory Visit: Payer: Self-pay

## 2022-02-09 DIAGNOSIS — I10 Essential (primary) hypertension: Secondary | ICD-10-CM | POA: Diagnosis present

## 2022-02-09 DIAGNOSIS — E782 Mixed hyperlipidemia: Secondary | ICD-10-CM | POA: Diagnosis present

## 2022-02-09 DIAGNOSIS — E1149 Type 2 diabetes mellitus with other diabetic neurological complication: Secondary | ICD-10-CM | POA: Diagnosis not present

## 2022-02-09 NOTE — Patient Instructions (Addendum)
Goals  Eat three meals per day Increase more plant based foods-whole grains, nuts, seeds and fruits and vegetables. Don't skip meals. Prevent low blood sugars. Get A1C 5.7% or less  Lifestyle Medicine - Whole Food, Plant Predominant Nutrition is highly recommended: Eat Plenty of vegetables, Mushrooms, fruits, Legumes, Whole Grains, Nuts, seeds in lieu of processed meats, processed snacks/pastries red meat, poultry, eggs.    -It is better to avoid simple carbohydrates including: Cakes, Sweet Desserts, Ice Cream, Soda (diet and regular), Sweet Tea, Candies, Chips, Cookies, Store Bought Juices, Alcohol in Excess of  1-2 drinks a day, Lemonade,  Artificial Sweeteners, Doughnuts, Coffee Creamers, "Sugar-free" Products, etc, etc.  This is not a complete list.....  Exercise: If you are able: 30 -60 minutes a day ,4 days a week, or 150 minutes a week.  The longer the better.  Combine stretch, strength, and aerobic activities.  If you were told in the past that you have high risk for cardiovascular diseases, you may seek evaluation by your heart doctor prior to initiating moderate to intense exercise programs.

## 2022-02-09 NOTE — Progress Notes (Signed)
Medical Nutrition Therapy  Dm Follow up Appointment Start time:  7829  Appointment End time:  1600  Primary concerns today: Diabetes Type 2  Referral diagnosis: E11.8 Preferred learning style: . Read  Learning readiness: Contemplating   NUTRITION ASSESSMENT Follow up  FBS 107-120's and Bedtime 98-127 mg/dl. A1C improved down to 6.2% from 7.5%.  Light headed 1-2 times per week. Typically mid morning. She usually eats something and the  lightheadedness may go away. Cut out regular sodas. Eating more balanced meals BM are regular. Requested to test BS when feeling light headed. Current diet is much improved.   Anthropometrics  Wt Readings from Last 3 Encounters:  02/03/22 136 lb (61.7 kg)  01/23/22 140 lb (63.5 kg)  12/01/21 146 lb (66.2 kg)   Ht Readings from Last 3 Encounters:  02/03/22 5\' 3"  (1.6 m)  01/23/22 5\' 3"  (1.6 m)  12/01/21 5\' 3"  (1.6 m)   There is no height or weight on file to calculate BMI. @BMIFA @ Facility age limit for growth percentiles is 20 years. Facility age limit for growth percentiles is 20 years.    Clinical Medical Hx: Hyperlipidemia, HTN Medications: Metformin 500 mg once a day Labs:  Lab Results  Component Value Date   HGBA1C 6.2 01/23/2022   CMP Latest Ref Rng & Units 10/22/2021 10/13/2021 07/22/2021  Glucose 70 - 99 mg/dL 156(H) 163(H) 97  BUN 8 - 23 mg/dL 9 11 7(L)  Creatinine 0.44 - 1.00 mg/dL 0.80 0.84 0.69  Sodium 135 - 145 mmol/L 137 140 136  Potassium 3.5 - 5.1 mmol/L 3.7 3.8 3.4(L)  Chloride 98 - 111 mmol/L 102 102 102  CO2 22 - 32 mmol/L 27 23 26   Calcium 8.9 - 10.3 mg/dL 9.7 10.0 9.2  Total Protein 6.5 - 8.1 g/dL 8.0 7.1 7.9  Total Bilirubin 0.3 - 1.2 mg/dL 1.9(H) 1.4(H) 1.4(H)  Alkaline Phos 38 - 126 U/L 76 80 77  AST 15 - 41 U/L 75(H) 44(H) 48(H)  ALT 0 - 44 U/L 65(H) 39(H) 44   Lipid Panel     Component Value Date/Time   CHOL 147 10/13/2021 0935   TRIG 111 10/13/2021 0935   HDL 39 (L) 10/13/2021 0935   CHOLHDL 3.8  10/13/2021 0935   CHOLHDL 4.8 05/23/2019 0758   VLDL 36 05/23/2019 0758   LDLCALC 88 10/13/2021 0935   LABVLDL 20 10/13/2021 0935   Notable Signs/Symptoms: none  Lifestyle & Dietary Hx Lives with her husband. She cooks. Eats 2 meals a day. Skips breakfast. Checks blood sugars sometimes at night. BS at night 120-140's.  Estimated daily fluid intake: 24 oz Supplements: Calcium, VIt D 3 Sleep: 6 hours Stress / self-care: none Current average weekly physical activity: ADL  24-Hr Dietary Recall First Meal:  Rice chex and fruit, water Snack: occasional fruit Second Meal: leftover or sandwich, water Third Meal: pasta, spaghetti, Snack:  Beverages: soda, some water  Estimated Energy Needs Calories: 1500-1800 Carbohydrate: 170g Protein: 1112g Fat: 42g   NUTRITION DIAGNOSIS  NB-1.1 Food and nutrition-related knowledge deficit As related to Diabetes Type 2.  As evidenced by A1C 7.6%.   NUTRITION INTERVENTION  Nutrition education (E-1) on the following topics:  Nutrition and Diabetes education provided on My Plate, CHO counting, meal planning, portion sizes, timing of meals, avoiding snacks between meals unless having a low blood sugar, target ranges for A1C and blood sugars, signs/symptoms and treatment of hyper/hypoglycemia, monitoring blood sugars, taking medications as prescribed, benefits of exercising 30 minutes per day and  prevention of complications of DM. Lifestyle Medicine - Whole Food, Plant Predominant Nutrition is highly recommended: Eat Plenty of vegetables, Mushrooms, fruits, Legumes, Whole Grains, Nuts, seeds in lieu of processed meats, processed snacks/pastries red meat, poultry, eggs.    -It is better to avoid simple carbohydrates including: Cakes, Sweet Desserts, Ice Cream, Soda (diet and regular), Sweet Tea, Candies, Chips, Cookies, Store Bought Juices, Alcohol in Excess of  1-2 drinks a day, Lemonade,  Artificial Sweeteners, Doughnuts, Coffee Creamers,  "Sugar-free" Products, etc, etc.  This is not a complete list.....  Exercise: If you are able: 30 -60 minutes a day ,4 days a week, or 150 minutes a week.  The longer the better.  Combine stretch, strength, and aerobic activities.  If you were told in the past that you have high risk for cardiovascular diseases, you may seek evaluation by your heart doctor prior to initiating moderate to intense exercise programs.  Handouts Provided Include  Nutrition lifestyle Plant based myplate Meal Plan Card  Learning Style & Readiness for Change Teaching method utilized: Visual & Auditory  Demonstrated degree of understanding via: Teach Back  Barriers to learning/adherence to lifestyle change: none  Goals Established by Pt   Eat three meals per day Increase more plant based foods-whole grains, nuts, seeds and fruits and vegetables. Don't skip meals. Prevent low blood sugars. Get A1C 5.7% or less   MONITORING & EVALUATION Dietary intake, weekly physical activity, and blood sugars in 6 month.  Next Steps  Patient is to work on better eating habits and cut out sodas.Marland Kitchen

## 2022-02-25 NOTE — Patient Instructions (Signed)
Andrea Simon  02/25/2022     '@[email or web address]'$ @   Your procedure is scheduled on  03/04/2022.   Report to [facility] at  [email or web address]   Call this number if you have problems the morning of surgery:  [phone]   Remember:  Do not eat after midnight.   You may drink clear liquids until  0445 am on 03/04/2022 .    Clear liquids allowed are:                    Water, Juice (non-citric and without pulp - diabetics please choose diet or no sugar options), Carbonated beverages - (diabetics please choose diet or no sugar options), Clear Tea, Black Coffee only (no creamer, milk or cream including half and half), Plain Jell-O only (diabetics please choose diet or no sugar options), Gatorade (diabetics please choose diet or no sugar options), and Plain Popsicles only  At  0445 am on 03/04/2022, drink your G2 drink.    You can have nothing else to drink after this.     DO NOT take any medications for diabetes the morning of your procedure.    Take these medicines the morning of surgery with A SIP OF WATER                                      amlodipine.     Do not wear jewelry, make-up or nail polish.  Do not wear lotions, powders, or perfumes, or deodorant.  Do not shave 48 hours prior to surgery.  Men may shave face and neck.  Do not bring valuables to the hospital.  [facility] is not responsible for any belongings or valuables.  Contacts, dentures or bridgework may not be worn into surgery.  Leave your suitcase in the car.  After surgery it may be brought to your room.  For patients admitted to the hospital, discharge time will be determined by your treatment team.  Patients discharged the day of surgery will not be allowed to drive home and must have someone with them for 24 hours.    Special instructions:         DO NOT smoke tobacco or vape for 24 hours before your procedure.  Please read over the following fact sheets that you were given. Anesthesia  Post-op Instructions and Care and Recovery After Surgery      Dilation and Curettage or Vacuum Curettage, Care After The following information offers guidance on how to care for yourself after your procedure. Your doctor may also give you more specific instructions. If you have problems or questions, contact your doctor. What can I expect after the procedure? After the procedure, it is common to have: Mild pain or cramps. Some bleeding or spotting from the vagina. These may last for up to 2 weeks. Follow these instructions at home: Medicines Take over-the-counter and prescription medicines only as told by your doctor. If told, take steps to prevent problems with pooping (constipation). You may need to: Drink enough fluid to keep your pee (urine) pale yellow. Take medicines. You will be told what medicines to take. Eat foods that are high in fiber. These include beans, whole grains, and fresh fruits and vegetables. Limit foods that are high in fat and sugar. These include fried or sweet foods. Ask your doctor if you should avoid driving or  using machines while you are taking your medicine. Activity  If you were given a medicine to help you relax (sedative) during your procedure, it can affect you for many hours. Do not drive or use machinery until your doctor says that it is safe. Rest as told by your doctor. Get up to take short walks every 1-2 hours. Ask for help if you feel weak or unsteady. Do not lift anything that is heavier than 10 lb (4.5 kg), or the limit that you are told. Return to your normal activities when your doctor says that it is safe. Lifestyle For at least 2 weeks, or as long as told by your doctor: Do not douche. Do not use tampons. Do not have sex. General instructions Do not take baths, swim, or use a hot tub. Ask your doctor if you may take showers. Do not smoke or use any products that contain nicotine or tobacco. These can delay healing. If you need help  quitting, ask your doctor. Wear compression stockings as told by your doctor. It is up to you to get the results of your procedure. Ask how to get your results when they are ready. Keep all follow-up visits. Contact a doctor if: You have very bad cramps that get worse or do not get better with medicine. You have very bad pain in your belly (abdomen). You cannot drink fluids without vomiting. You have pain in the area just above your thighs. You have fluid from your vagina that smells bad. You have a rash. Get help right away if: You are bleeding a lot from your vagina. This means soaking more than one sanitary pad in 1 hour, and this happens for 2 hours in a row. You have a fever that is above 100.72F (38C). Your belly feels very tender or hard. You have chest pain. You have trouble breathing. You feel dizzy or light-headed. You faint. You have pain in your neck or shoulder area. These symptoms may be an emergency. Get help right away. Call your local emergency services (911 in the [place] Do not wait to see if the symptoms will go away. Do not drive yourself to the hospital. Summary After your procedure, it is common to have pain or cramping. It is also common to have bleeding or spotting from your vagina. Rest as told. Get up to take short walks every 1-2 hours. Do not lift anything that is heavier than 10 lb (4.5 kg), or the limit that you are told. Get help right away if you have problems from the procedure. Ask your doctor what problems to watch for. This information is not intended to replace advice given to you by your health care provider. Make sure you discuss any questions you have with your health care provider. Document Revised: 11/27/2020 Document Reviewed: 11/27/2020 Elsevier Patient Education  2022 [employer] Anesthesia, Adult, Care After This sheet gives you information about how to care for yourself after your procedure. Your health care provider may also  give you more specific instructions. If you have problems or questions, contact your health care provider. What can I expect after the procedure? After the procedure, the following side effects are common: Pain or discomfort at the IV site. Nausea. Vomiting. Sore throat. Trouble concentrating. Feeling cold or chills. Feeling weak or tired. Sleepiness and fatigue. Soreness and body aches. These side effects can affect parts of the body that were not involved in surgery. Follow these instructions at home: For the time period you were  told by your health care provider:  Rest. Do not participate in activities where you could fall or become injured. Do not drive or use machinery. Do not drink alcohol. Do not take sleeping pills or medicines that cause drowsiness. Do not make important decisions or sign legal documents. Do not take care of children on your own. Eating and drinking Follow any instructions from your health care provider about eating or drinking restrictions. When you feel hungry, start by eating small amounts of foods that are soft and easy to digest (bland), such as toast. Gradually return to your regular diet. Drink enough fluid to keep your urine pale yellow. If you vomit, rehydrate by drinking water, juice, or clear broth. General instructions If you have sleep apnea, surgery and certain medicines can increase your risk for breathing problems. Follow instructions from your health care provider about wearing your sleep device: Anytime you are sleeping, including during daytime naps. While taking prescription pain medicines, sleeping medicines, or medicines that make you drowsy. Have a responsible adult stay with you for the time you are told. It is important to have someone help care for you until you are awake and alert. Return to your normal activities as told by your health care provider. Ask your health care provider what activities are safe for you. Take  over-the-counter and prescription medicines only as told by your health care provider. If you smoke, do not smoke without supervision. Keep all follow-up visits as told by your health care provider. This is important. Contact a health care provider if: You have nausea or vomiting that does not get better with medicine. You cannot eat or drink without vomiting. You have pain that does not get better with medicine. You are unable to pass urine. You develop a skin rash. You have a fever. You have redness around your IV site that gets worse. Get help right away if: You have difficulty breathing. You have chest pain. You have blood in your urine or stool, or you vomit blood. Summary After the procedure, it is common to have a sore throat or nausea. It is also common to feel tired. Have a responsible adult stay with you for the time you are told. It is important to have someone help care for you until you are awake and alert. When you feel hungry, start by eating small amounts of foods that are soft and easy to digest (bland), such as toast. Gradually return to your regular diet. Drink enough fluid to keep your urine pale yellow. Return to your normal activities as told by your health care provider. Ask your health care provider what activities are safe for you. This information is not intended to replace advice given to you by your health care provider. Make sure you discuss any questions you have with your health care provider. Document Revised: 08/22/2020 Document Reviewed: 03/21/2020 Elsevier Patient Education  2022 [employer]. How to Use Chlorhexidine for Bathing Chlorhexidine gluconate (CHG) is a germ-killing (antiseptic) solution that is used to clean the skin. It can get rid of the bacteria that normally live on the skin and can keep them away for about 24 hours. To clean your skin with CHG, you may be given: A CHG solution to use in the shower or as part of a sponge bath. A  prepackaged cloth that contains CHG. Cleaning your skin with CHG may help lower the risk for infection: While you are staying in the intensive care unit of the hospital. If you have  a vascular access, such as a central line, to provide short-term or long-term access to your veins. If you have a catheter to drain urine from your bladder. If you are on a ventilator. A ventilator is a machine that helps you breathe by moving air in and out of your lungs. After surgery. What are the risks? Risks of using CHG include: A skin reaction. Hearing loss, if CHG gets in your ears and you have a perforated eardrum. Eye injury, if CHG gets in your eyes and is not rinsed out. The CHG product catching fire. Make sure that you avoid smoking and flames after applying CHG to your skin. Do not use CHG: If you have a chlorhexidine allergy or have previously reacted to chlorhexidine. On babies younger than 6 months of age. How to use CHG solution Use CHG only as told by your health care provider, and follow the instructions on the label. Use the full amount of CHG as directed. Usually, this is one bottle. During a shower Follow these steps when using CHG solution during a shower (unless your health care provider gives you different instructions): Start the shower. Use your normal soap and shampoo to wash your face and hair. Turn off the shower or move out of the shower stream. Pour the CHG onto a clean washcloth. Do not use any type of brush or rough-edged sponge. Starting at your neck, lather your body down to your toes. Make sure you follow these instructions: If you will be having surgery, pay special attention to the part of your body where you will be having surgery. Scrub this area for at least 1 minute. Do not use CHG on your head or face. If the solution gets into your ears or eyes, rinse them well with water. Avoid your genital area. Avoid any areas of skin that have broken skin, cuts, or  scrapes. Scrub your back and under your arms. Make sure to wash skin folds. Let the lather sit on your skin for 1-2 minutes or as long as told by your health care provider. Thoroughly rinse your entire body in the shower. Make sure that all body creases and crevices are rinsed well. Dry off with a clean towel. Do not put any substances on your body afterward--such as powder, lotion, or perfume--unless you are told to do so by your health care provider. Only use lotions that are recommended by the manufacturer. Put on clean clothes or pajamas. If it is the night before your surgery, sleep in clean sheets.  During a sponge bath Follow these steps when using CHG solution during a sponge bath (unless your health care provider gives you different instructions): Use your normal soap and shampoo to wash your face and hair. Pour the CHG onto a clean washcloth. Starting at your neck, lather your body down to your toes. Make sure you follow these instructions: If you will be having surgery, pay special attention to the part of your body where you will be having surgery. Scrub this area for at least 1 minute. Do not use CHG on your head or face. If the solution gets into your ears or eyes, rinse them well with water. Avoid your genital area. Avoid any areas of skin that have broken skin, cuts, or scrapes. Scrub your back and under your arms. Make sure to wash skin folds. Let the lather sit on your skin for 1-2 minutes or as long as told by your health care provider. Using a different clean, wet washcloth,  thoroughly rinse your entire body. Make sure that all body creases and crevices are rinsed well. Dry off with a clean towel. Do not put any substances on your body afterward--such as powder, lotion, or perfume--unless you are told to do so by your health care provider. Only use lotions that are recommended by the manufacturer. Put on clean clothes or pajamas. If it is the night before your surgery, sleep  in clean sheets. How to use CHG prepackaged cloths Only use CHG cloths as told by your health care provider, and follow the instructions on the label. Use the CHG cloth on clean, dry skin. Do not use the CHG cloth on your head or face unless your health care provider tells you to. When washing with the CHG cloth: Avoid your genital area. Avoid any areas of skin that have broken skin, cuts, or scrapes. Before surgery Follow these steps when using a CHG cloth to clean before surgery (unless your health care provider gives you different instructions): Using the CHG cloth, vigorously scrub the part of your body where you will be having surgery. Scrub using a back-and-forth motion for 3 minutes. The area on your body should be completely wet with CHG when you are done scrubbing. Do not rinse. Discard the cloth and let the area air-dry. Do not put any substances on the area afterward, such as powder, lotion, or perfume. Put on clean clothes or pajamas. If it is the night before your surgery, sleep in clean sheets.  For general bathing Follow these steps when using CHG cloths for general bathing (unless your health care provider gives you different instructions). Use a separate CHG cloth for each area of your body. Make sure you wash between any folds of skin and between your fingers and toes. Wash your body in the following order, switching to a new cloth after each step: The front of your neck, shoulders, and chest. Both of your arms, under your arms, and your hands. Your stomach and groin area, avoiding the genitals. Your right leg and foot. Your left leg and foot. The back of your neck, your back, and your buttocks. Do not rinse. Discard the cloth and let the area air-dry. Do not put any substances on your body afterward--such as powder, lotion, or perfume--unless you are told to do so by your health care provider. Only use lotions that are recommended by the manufacturer. Put on clean clothes  or pajamas. Contact a health care provider if: Your skin gets irritated after scrubbing. You have questions about using your solution or cloth. You swallow any chlorhexidine. Call your local poison control center (1-610-791-4561 in the [place] Get help right away if: Your eyes itch badly, or they become very red or swollen. Your skin itches badly and is red or swollen. Your hearing changes. You have trouble seeing. You have swelling or tingling in your mouth or throat. You have trouble breathing. These symptoms may represent a serious problem that is an emergency. Do not wait to see if the symptoms will go away. Get medical help right away. Call your local emergency services (911 in the [place] Do not drive yourself to the hospital. Summary Chlorhexidine gluconate (CHG) is a germ-killing (antiseptic) solution that is used to clean the skin. Cleaning your skin with CHG may help to lower your risk for infection. You may be given CHG to use for bathing. It may be in a bottle or in a prepackaged cloth to use on your skin. Carefully follow your health  care provider's instructions and the instructions on the product label. Do not use CHG if you have a chlorhexidine allergy. Contact your health care provider if your skin gets irritated after scrubbing. This information is not intended to replace advice given to you by your health care provider. Make sure you discuss any questions you have with your health care provider. Document Revised: 02/17/2021 Document Reviewed: 02/17/2021 Elsevier Patient Education  2022 [employer].

## 2022-03-02 ENCOUNTER — Encounter (HOSPITAL_COMMUNITY): Payer: Self-pay

## 2022-03-02 ENCOUNTER — Encounter (HOSPITAL_COMMUNITY)
Admission: RE | Admit: 2022-03-02 | Discharge: 2022-03-02 | Disposition: A | Discharge status: Home / Self Care | Payer: 59 | Source: Ambulatory Visit | Attending: Obstetrics & Gynecology | Admitting: Obstetrics & Gynecology

## 2022-03-02 ENCOUNTER — Other Ambulatory Visit: Payer: Self-pay | Admitting: Obstetrics & Gynecology

## 2022-03-02 VITALS — BP 116/71 | HR 75 | Temp 98.5°F | Resp 18 | Ht 63.0 in | Wt 135.0 lb

## 2022-03-02 DIAGNOSIS — Z01818 Encounter for other preprocedural examination: Secondary | ICD-10-CM | POA: Insufficient documentation

## 2022-03-02 DIAGNOSIS — E1149 Type 2 diabetes mellitus with other diabetic neurological complication: Secondary | ICD-10-CM | POA: Insufficient documentation

## 2022-03-02 HISTORY — DX: Prediabetes: R73.03

## 2022-03-02 HISTORY — DX: Essential (primary) hypertension: I10

## 2022-03-02 LAB — COMPREHENSIVE METABOLIC PANEL
ALT: 34 U/L (ref 0–44)
AST: 36 U/L (ref 15–41)
Albumin: 5.1 g/dL — ABNORMAL HIGH (ref 3.5–5.0)
Alkaline Phosphatase: 70 U/L (ref 38–126)
Anion gap: 10 (ref 5–15)
BUN: 11 mg/dL (ref 8–23)
CO2: 28 mmol/L (ref 22–32)
Calcium: 10.4 mg/dL — ABNORMAL HIGH (ref 8.9–10.3)
Chloride: 101 mmol/L (ref 98–111)
Creatinine, Ser: 0.72 mg/dL (ref 0.44–1.00)
GFR, Estimated: >60 mL/min (ref >60)
Glucose, Bld: 85 mg/dL (ref 70–99)
Potassium: 3.7 mmol/L (ref 3.5–5.1)
Sodium: 139 mmol/L (ref 135–145)
Total Bilirubin: 1.5 mg/dL — ABNORMAL HIGH (ref 0.3–1.2)
Total Protein: 8.3 g/dL — ABNORMAL HIGH (ref 6.5–8.1)

## 2022-03-02 LAB — URINALYSIS, ROUTINE W REFLEX MICROSCOPIC
Bilirubin Urine: NEGATIVE
Glucose, UA: NEGATIVE mg/dL
Hgb urine dipstick: NEGATIVE
Ketones, ur: NEGATIVE mg/dL
Leukocytes,Ua: NEGATIVE
Nitrite: NEGATIVE
Protein, ur: NEGATIVE mg/dL
Specific Gravity, Urine: 1.005 (ref 1.005–1.030)
pH: 8 (ref 5.0–8.0)

## 2022-03-02 LAB — CBC
HCT: 44.5 % (ref 36.0–46.0)
Hemoglobin: 14.9 g/dL (ref 12.0–15.0)
MCH: 32.6 pg (ref 26.0–34.0)
MCHC: 33.5 g/dL (ref 30.0–36.0)
MCV: 97.4 fL (ref 80.0–100.0)
Platelets: 218 10*3/uL (ref 150–400)
RBC: 4.57 MIL/uL (ref 3.87–5.11)
RDW: 12.3 % (ref 11.5–15.5)
WBC: 6.6 10*3/uL (ref 4.0–10.5)
nRBC: 0 % (ref 0.0–0.2)

## 2022-03-02 LAB — RAPID HIV SCREEN (HIV 1/2 AB+AG)
HIV 1/2 Antibodies: NONREACTIVE
HIV-1 P24 Antigen - HIV24: NONREACTIVE

## 2022-03-03 LAB — HEMOGLOBIN A1C
Hgb A1c MFr Bld: 6.1 % — ABNORMAL HIGH (ref 4.8–5.6)
Mean Plasma Glucose: 128 mg/dL

## 2022-03-04 ENCOUNTER — Ambulatory Visit (HOSPITAL_COMMUNITY)
Admission: RE | Admit: 2022-03-04 | Discharge: 2022-03-04 | Disposition: A | Discharge status: Home / Self Care | Payer: 59 | Source: Ambulatory Visit | Attending: Obstetrics & Gynecology | Admitting: Obstetrics & Gynecology

## 2022-03-04 ENCOUNTER — Ambulatory Visit (HOSPITAL_BASED_OUTPATIENT_CLINIC_OR_DEPARTMENT_OTHER): Payer: 59 | Admitting: Certified Registered"

## 2022-03-04 ENCOUNTER — Encounter (HOSPITAL_COMMUNITY): Payer: Self-pay | Admitting: Obstetrics & Gynecology

## 2022-03-04 ENCOUNTER — Other Ambulatory Visit: Payer: Self-pay

## 2022-03-04 ENCOUNTER — Encounter (HOSPITAL_COMMUNITY)
Admission: RE | Disposition: A | Discharge status: Home / Self Care | Payer: Self-pay | Source: Ambulatory Visit | Attending: Obstetrics & Gynecology

## 2022-03-04 ENCOUNTER — Ambulatory Visit (HOSPITAL_COMMUNITY): Payer: 59 | Admitting: Certified Registered"

## 2022-03-04 DIAGNOSIS — F17210 Nicotine dependence, cigarettes, uncomplicated: Secondary | ICD-10-CM | POA: Insufficient documentation

## 2022-03-04 DIAGNOSIS — N950 Postmenopausal bleeding: Secondary | ICD-10-CM | POA: Insufficient documentation

## 2022-03-04 DIAGNOSIS — E119 Type 2 diabetes mellitus without complications: Secondary | ICD-10-CM | POA: Diagnosis not present

## 2022-03-04 DIAGNOSIS — I10 Essential (primary) hypertension: Secondary | ICD-10-CM | POA: Insufficient documentation

## 2022-03-04 DIAGNOSIS — D250 Submucous leiomyoma of uterus: Secondary | ICD-10-CM | POA: Diagnosis not present

## 2022-03-04 DIAGNOSIS — N840 Polyp of corpus uteri: Secondary | ICD-10-CM | POA: Diagnosis not present

## 2022-03-04 DIAGNOSIS — R9389 Abnormal findings on diagnostic imaging of other specified body structures: Secondary | ICD-10-CM

## 2022-03-04 HISTORY — PX: HYSTEROSCOPY WITH D & C: SHX1775

## 2022-03-04 HISTORY — PX: POLYPECTOMY: SHX5525

## 2022-03-04 LAB — GLUCOSE, CAPILLARY
Glucose-Capillary: 77 mg/dL (ref 70–99)
Glucose-Capillary: 95 mg/dL (ref 70–99)

## 2022-03-04 SURGERY — DILATATION AND CURETTAGE /HYSTEROSCOPY
Anesthesia: General | Site: Vagina

## 2022-03-04 MED ORDER — DEXAMETHASONE SODIUM PHOSPHATE 10 MG/ML IJ SOLN
INTRAMUSCULAR | Status: AC
  Filled 2022-03-04: qty 1

## 2022-03-04 MED ORDER — FENTANYL CITRATE (PF) 100 MCG/2ML IJ SOLN
INTRAMUSCULAR | Status: DC | PRN
  Administered 2022-03-04: 25 ug via INTRAVENOUS

## 2022-03-04 MED ORDER — FENTANYL CITRATE PF 50 MCG/ML IJ SOSY: 25.0000 ug | PREFILLED_SYRINGE | INTRAMUSCULAR | Status: DC | PRN

## 2022-03-04 MED ORDER — PROPOFOL 10 MG/ML IV BOLUS
INTRAVENOUS | Status: DC | PRN
  Administered 2022-03-04: 150 mg via INTRAVENOUS

## 2022-03-04 MED ORDER — KETOROLAC TROMETHAMINE 30 MG/ML IJ SOLN
30.0000 mg | Freq: Once | INTRAMUSCULAR | Status: AC
  Administered 2022-03-04: 30 mg via INTRAVENOUS
  Filled 2022-03-04: qty 1

## 2022-03-04 MED ORDER — POVIDONE-IODINE 10 % EX SWAB
2.0000 "application " | Freq: Once | CUTANEOUS | Status: AC
  Administered 2022-03-04: 2 via TOPICAL

## 2022-03-04 MED ORDER — EPHEDRINE SULFATE-NACL 50-0.9 MG/10ML-% IV SOSY
PREFILLED_SYRINGE | INTRAVENOUS | Status: DC | PRN
  Administered 2022-03-04 (×2): 10 mg via INTRAVENOUS

## 2022-03-04 MED ORDER — LIDOCAINE HCL (PF) 2 % IJ SOLN
INTRAMUSCULAR | Status: AC
  Filled 2022-03-04: qty 5

## 2022-03-04 MED ORDER — SEVOFLURANE IN SOLN
RESPIRATORY_TRACT | Status: AC
  Filled 2022-03-04: qty 250

## 2022-03-04 MED ORDER — KETOROLAC TROMETHAMINE 10 MG PO TABS: 10.0000 mg | ORAL_TABLET | Freq: Three times a day (TID) | ORAL | 0 refills | Status: DC | PRN

## 2022-03-04 MED ORDER — LIDOCAINE 2% (20 MG/ML) 5 ML SYRINGE
INTRAMUSCULAR | Status: DC | PRN
Start: 2022-03-04 — End: 2022-03-04
  Administered 2022-03-04: 60 mg via INTRAVENOUS

## 2022-03-04 MED ORDER — 0.9 % SODIUM CHLORIDE (POUR BTL) OPTIME
TOPICAL | Status: DC | PRN
  Administered 2022-03-04: 1000 mL

## 2022-03-04 MED ORDER — MIDAZOLAM HCL 2 MG/2ML IJ SOLN
INTRAMUSCULAR | Status: AC
  Filled 2022-03-04: qty 2

## 2022-03-04 MED ORDER — LACTATED RINGERS IV SOLN: INTRAVENOUS | Status: DC | PRN

## 2022-03-04 MED ORDER — ORAL CARE MOUTH RINSE: 15.0000 mL | Freq: Once | OROMUCOSAL | Status: DC

## 2022-03-04 MED ORDER — LACTATED RINGERS IV SOLN: INTRAVENOUS | Status: DC

## 2022-03-04 MED ORDER — ONDANSETRON HCL 4 MG/2ML IJ SOLN: 4.0000 mg | Freq: Once | INTRAMUSCULAR | Status: DC | PRN

## 2022-03-04 MED ORDER — MIDAZOLAM HCL 5 MG/5ML IJ SOLN
INTRAMUSCULAR | Status: DC | PRN
  Administered 2022-03-04: 2 mg via INTRAVENOUS

## 2022-03-04 MED ORDER — PROPOFOL 10 MG/ML IV BOLUS
INTRAVENOUS | Status: AC
  Filled 2022-03-04: qty 20

## 2022-03-04 MED ORDER — CHLORHEXIDINE GLUCONATE 0.12 % MT SOLN: 15.0000 mL | Freq: Once | OROMUCOSAL | Status: DC

## 2022-03-04 MED ORDER — FENTANYL CITRATE (PF) 100 MCG/2ML IJ SOLN
INTRAMUSCULAR | Status: AC
  Filled 2022-03-04: qty 2

## 2022-03-04 MED ORDER — EPHEDRINE 5 MG/ML INJ
INTRAVENOUS | Status: AC
  Filled 2022-03-04: qty 10

## 2022-03-04 MED ORDER — SODIUM CHLORIDE 0.9 % IR SOLN
Status: DC | PRN
  Administered 2022-03-04: 3000 mL

## 2022-03-04 MED ORDER — ONDANSETRON HCL 4 MG/2ML IJ SOLN
INTRAMUSCULAR | Status: DC | PRN
  Administered 2022-03-04: 4 mg via INTRAVENOUS

## 2022-03-04 MED ORDER — DEXAMETHASONE SODIUM PHOSPHATE 10 MG/ML IJ SOLN
INTRAMUSCULAR | Status: DC | PRN
  Administered 2022-03-04: 10 mg via INTRAVENOUS

## 2022-03-04 MED ORDER — CEFAZOLIN SODIUM-DEXTROSE 2-4 GM/100ML-% IV SOLN
2.0000 g | INTRAVENOUS | Status: AC
  Administered 2022-03-04: 2 g via INTRAVENOUS
  Filled 2022-03-04: qty 100

## 2022-03-04 MED ORDER — ONDANSETRON 8 MG PO TBDP: 8.0000 mg | ORAL_TABLET | Freq: Three times a day (TID) | ORAL | 0 refills | Status: DC | PRN

## 2022-03-04 SURGICAL SUPPLY — 24 items
CLOTH BEACON ORANGE TIMEOUT ST (SAFETY) ×2 IMPLANT
COVER LIGHT HANDLE STERIS (MISCELLANEOUS) ×4 IMPLANT
GAUZE 4X4 16PLY ~~LOC~~+RFID DBL (SPONGE) ×1 IMPLANT
GLOVE ECLIPSE 8.0 STRL XLNG CF (GLOVE) ×2 IMPLANT
GLOVE SRG 8 PF TXTR STRL LF DI (GLOVE) ×1 IMPLANT
GLOVE SURG UNDER POLY LF SZ7 (GLOVE) ×4 IMPLANT
GLOVE SURG UNDER POLY LF SZ8 (GLOVE) ×2
GOWN STRL REUS W/TWL LRG LVL3 (GOWN DISPOSABLE) ×2 IMPLANT
GOWN STRL REUS W/TWL XL LVL3 (GOWN DISPOSABLE) ×2 IMPLANT
INST SET HYSTEROSCOPY (KITS) ×2 IMPLANT
IV NS IRRIG 3000ML ARTHROMATIC (IV SOLUTION) ×1 IMPLANT
KIT TURNOVER CYSTO (KITS) ×2 IMPLANT
MANIFOLD NEPTUNE II (INSTRUMENTS) ×2 IMPLANT
MARKER SKIN DUAL TIP RULER LAB (MISCELLANEOUS) ×2 IMPLANT
NS IRRIG 1000ML POUR BTL (IV SOLUTION) ×2 IMPLANT
PACK BASIC III (CUSTOM PROCEDURE TRAY) ×2
PACK PERI GYN (CUSTOM PROCEDURE TRAY) ×2 IMPLANT
PACK SRG BSC III STRL LF ECLPS (CUSTOM PROCEDURE TRAY) ×1 IMPLANT
PAD ARMBOARD 7.5X6 YLW CONV (MISCELLANEOUS) ×2 IMPLANT
PAD TELFA 3X4 1S STER (GAUZE/BANDAGES/DRESSINGS) ×3 IMPLANT
SET BASIN LINEN APH (SET/KITS/TRAYS/PACK) ×2 IMPLANT
SET CYSTO W/LG BORE CLAMP LF (SET/KITS/TRAYS/PACK) ×2 IMPLANT
SHEET LAVH (DRAPES) ×2 IMPLANT
TUBE CONNECTING 12X1/4 (SUCTIONS) ×2 IMPLANT

## 2022-03-04 NOTE — Anesthesia Procedure Notes (Signed)
Procedure Name: LMA Insertion ?Date/Time: 03/04/2022 8:42 AM ?Performed by: Myna Bright, CRNA ?Pre-anesthesia Checklist: Patient identified, Emergency Drugs available, Suction available and Patient being monitored ?Patient Re-evaluated:Patient Re-evaluated prior to induction ?Oxygen Delivery Method: Circle system utilized ?Preoxygenation: Pre-oxygenation with 100% oxygen ?Induction Type: IV induction ?Ventilation: Mask ventilation without difficulty ?LMA: LMA inserted ?LMA Size: 4.0 ?Number of attempts: 1 ?Placement Confirmation: positive ETCO2 and breath sounds checked- equal and bilateral ?Tube secured with: Tape ?Dental Injury: Teeth and Oropharynx as per pre-operative assessment  ? ? ? ? ?

## 2022-03-04 NOTE — H&P (Signed)
Preoperative History and Physical  Andrea Simon is a 66 y.o. G1P1001 with No LMP recorded. Patient is postmenopausal. admitted for a hysteroscopy uterine curettage to remove benign endometrial polyp.  Office biopsy and sonogram reveal benign polyp, bleeding continued  PMH:    Past Medical History:  Diagnosis Date   Cancer (HCC) 11/2018   Phreesia 09/16/2020   Hyperlipidemia    Hypertension    Insomnia due to anxiety and fear 01/27/2019   Multinodular goiter    Pre-diabetes    Prediabetes     PSH:     Past Surgical History:  Procedure Laterality Date   BIOPSY  12/07/2018   Procedure: BIOPSY;  Surgeon: Malissa Hippo, MD;  [facility]: [facility];  Service: Endoscopy;;  sigmoid colon   COLON RESECTION N/A 01/09/2019   Procedure: LAPAROSCOPIC RIGHT HEMICOLECTOMY;  Surgeon: Lucretia Roers, MD;  Location: [facility];  Service: General;  Laterality: N/A;   COLON SURGERY N/A    Phreesia 09/16/2020   COLONOSCOPY N/A 12/07/2018   Procedure: COLONOSCOPY;  Surgeon: Malissa Hippo, MD;  [facility]: [facility];  Service: Endoscopy;  Laterality: N/A;  12:45   COLONOSCOPY WITH PROPOFOL N/A 10/16/2020   Procedure: COLONOSCOPY WITH PROPOFOL;  Surgeon: Malissa Hippo, MD;  [facility]: [facility];  Service: Endoscopy;  Laterality: N/A;  955   DILATION AND CURETTAGE OF UTERUS  2006   FNA thyroid  2011 and 2019   benign   POLYPECTOMY  12/07/2018   Procedure: POLYPECTOMY;  Surgeon: Malissa Hippo, MD;  [facility]: [facility];  Service: Endoscopy;;  splenic flexure (CS x1)   POLYPECTOMY  10/16/2020   Procedure: POLYPECTOMY;  Surgeon: Malissa Hippo, MD;  [facility]: [facility];  Service: Endoscopy;;   PORTACATH PLACEMENT Left 02/15/2019   Procedure: INSERTION PORT-A-CATH;  Surgeon: Lucretia Roers, MD;  Location: [facility];  Service: General;  Laterality: Left;   TUBAL LIGATION  1992    POb/GynH:      OB History     Gravida  1   Para  1   Term  1   Preterm      AB       Living  1      SAB      IAB      Ectopic      Multiple      Live Births              SH:   Social History   Tobacco Use   Smoking status: Every Day    Packs/day: 0.75    Years: 45.00    Pack years: 33.75    Types: Cigarettes   Smokeless tobacco: Never  Vaping Use   Vaping Use: Never used  Substance Use Topics   Alcohol use: Yes    Alcohol/week: 0.0 standard drinks    Comment: occasionally   Drug use: No    FH:    Family History  Problem Relation Age of Onset   Hypertension Mother        AAA   Coronary artery disease Mother    Hyperlipidemia Mother    Cancer Mother        lung   Hypertension Father    Cancer Brother      Allergies: No Known Allergies  Medications:       Current Facility-Administered Medications:    ceFAZolin (ANCEF) IVPB 2g/100 mL premix, 2 g, Intravenous, On Call to OR, Lazaro Arms, MD  Facility-Administered Medications Ordered in Other Encounters:    sodium chloride flush (NS) 0.9 % injection 10 mL, 10 mL, Intracatheter, PRN, Doreatha Massed, MD, 10 mL at 04/26/19 0928   sodium chloride flush (NS) 0.9 % injection 10 mL, 10 mL, Intravenous, PRN, Doreatha Massed, MD, 10 mL at 01/09/21 1430  Review of Systems:   Review of Systems  Constitutional: Negative for fever, chills, weight loss, malaise/fatigue and diaphoresis.  HENT: Negative for hearing loss, ear pain, nosebleeds, congestion, sore throat, neck pain, tinnitus and ear discharge.   Eyes: Negative for blurred vision, double vision, photophobia, pain, discharge and redness.  Respiratory: Negative for cough, hemoptysis, sputum production, shortness of breath, wheezing and stridor.   Cardiovascular: Negative for chest pain, palpitations, orthopnea, claudication, leg swelling and PND.  Gastrointestinal: Positive for abdominal pain. Negative for heartburn, nausea, vomiting, diarrhea, constipation, blood in stool and melena.  Genitourinary: Negative for dysuria,  urgency, frequency, hematuria and flank pain.  Musculoskeletal: Negative for myalgias, back pain, joint pain and falls.  Skin: Negative for itching and rash.  Neurological: Negative for dizziness, tingling, tremors, sensory change, speech change, focal weakness, seizures, loss of consciousness, weakness and headaches.  Endo/Heme/Allergies: Negative for environmental allergies and polydipsia. Does not bruise/bleed easily.  Psychiatric/Behavioral: Negative for depression, suicidal ideas, hallucinations, memory loss and substance abuse. The patient is not nervous/anxious and does not have insomnia.      PHYSICAL EXAM:  Blood pressure 124/72, pulse 66, temperature 98.3 F (36.8 C), temperature source Oral, resp. rate 16, height 5\' 3"  (1.6 m), weight 61.2 kg, SpO2 98 %.    Vitals reviewed. Constitutional: She is oriented to person, place, and time. She appears well-developed and well-nourished.  HENT:  Head: Normocephalic and atraumatic.  Right Ear: External ear normal.  Left Ear: External ear normal.  Nose: Nose normal.  Mouth/Throat: Oropharynx is clear and moist.  Eyes: Conjunctivae and EOM are normal. Pupils are equal, round, and reactive to light. Right eye exhibits no discharge. Left eye exhibits no discharge. No scleral icterus.  Neck: Normal range of motion. Neck supple. No tracheal deviation present. No thyromegaly present.  Cardiovascular: Normal rate, regular rhythm, normal heart sounds and intact distal pulses.  Exam reveals no gallop and no friction rub.   No murmur heard. Respiratory: Effort normal and breath sounds normal. No respiratory distress. She has no wheezes. She has no rales. She exhibits no tenderness.  GI: Soft. Bowel sounds are normal. She exhibits no distension and no mass. There is tenderness. There is no rebound and no guarding.  Genitourinary:       Vulva is normal without lesions Vagina is pink moist without discharge Cervix normal in appearance and pap is  normal Uterus is normal size, contour, position, consistency, mobility, non-tender Adnexa is negative with normal sized ovaries by sonogram  Musculoskeletal: Normal range of motion. She exhibits no edema and no tenderness.  Neurological: She is alert and oriented to person, place, and time. She has normal reflexes. She displays normal reflexes. No cranial nerve deficit. She exhibits normal muscle tone. Coordination normal.  Skin: Skin is warm and dry. No rash noted. No erythema. No pallor.  Psychiatric: She has a normal mood and affect. Her behavior is normal. Judgment and thought content normal.    Labs: Results for orders placed or performed during the hospital encounter of 03/04/22 (from the past 336 hour(s))  Glucose, capillary   Collection Time: 03/04/22  8:09 AM  Result Value Ref Range   Glucose-Capillary 77 70 -  99 mg/dL  Results for orders placed or performed during the hospital encounter of 03/02/22 (from the past 336 hour(s))  CBC   Collection Time: 03/02/22 10:45 AM  Result Value Ref Range   WBC 6.6 4.0 - 10.5 K/uL   RBC 4.57 3.87 - 5.11 MIL/uL   Hemoglobin 14.9 12.0 - 15.0 g/dL   HCT 41.6 60.6 - 30.1 %   MCV 97.4 80.0 - 100.0 fL   MCH 32.6 26.0 - 34.0 pg   MCHC 33.5 30.0 - 36.0 g/dL   RDW 60.1 09.3 - 23.5 %   Platelets 218 150 - 400 K/uL   nRBC 0.0 0.0 - 0.2 %  Comprehensive metabolic panel   Collection Time: 03/02/22 10:45 AM  Result Value Ref Range   Sodium 139 135 - 145 mmol/L   Potassium 3.7 3.5 - 5.1 mmol/L   Chloride 101 98 - 111 mmol/L   CO2 28 22 - 32 mmol/L   Glucose, Bld 85 70 - 99 mg/dL   BUN 11 8 - 23 mg/dL   Creatinine, Ser 5.73 0.44 - 1.00 mg/dL   Calcium 22.0 (H) 8.9 - 10.3 mg/dL   Total Protein 8.3 (H) 6.5 - 8.1 g/dL   Albumin 5.1 (H) 3.5 - 5.0 g/dL   AST 36 15 - 41 U/L   ALT 34 0 - 44 U/L   Alkaline Phosphatase 70 38 - 126 U/L   Total Bilirubin 1.5 (H) 0.3 - 1.2 mg/dL   GFR, Estimated >25 >42 mL/min   Anion gap 10 5 - 15  Rapid HIV screen  (HIV 1/2 Ab+Ag)   Collection Time: 03/02/22 10:45 AM  Result Value Ref Range   HIV-1 P24 Antigen - HIV24 NON REACTIVE NON REACTIVE   HIV 1/2 Antibodies NON REACTIVE NON REACTIVE   Interpretation (HIV Ag Ab)      A non reactive test result means that HIV 1 or HIV 2 antibodies and HIV 1 p24 antigen were not detected in the specimen.  Urinalysis, Routine w reflex microscopic Urine, Clean Catch   Collection Time: 03/02/22 10:45 AM  Result Value Ref Range   Color, Urine STRAW (A) YELLOW   APPearance CLEAR CLEAR   Specific Gravity, Urine 1.005 1.005 - 1.030   pH 8.0 5.0 - 8.0   Glucose, UA NEGATIVE NEGATIVE mg/dL   Hgb urine dipstick NEGATIVE NEGATIVE   Bilirubin Urine NEGATIVE NEGATIVE   Ketones, ur NEGATIVE NEGATIVE mg/dL   Protein, ur NEGATIVE NEGATIVE mg/dL   Nitrite NEGATIVE NEGATIVE   Leukocytes,Ua NEGATIVE NEGATIVE  Hemoglobin A1c   Collection Time: 03/02/22 12:21 PM  Result Value Ref Range   Hgb A1c MFr Bld 6.1 (H) 4.8 - 5.6 %   Mean Plasma Glucose 128 mg/dL    EKG: Orders placed or performed during the hospital encounter of 03/02/22   EKG 12-Lead   EKG 12-Lead    Imaging Studies: No results found.    Assessment: Post menopausal bleeding Endometrial polyp  Plan: Hysteroscopy uterine curettage removal of endometrial polyp  Lazaro Arms 03/04/2022 8:20 AM

## 2022-03-04 NOTE — Anesthesia Postprocedure Evaluation (Signed)
Anesthesia Post Note ? ?Patient: Andrea Simon ? ?Procedure(s) Performed: DILATATION AND CURETTAGE /HYSTEROSCOPY (Vagina ) ?Removal of Endometrial Polyp (Vagina ) ? ?Patient location during evaluation: Phase II ?Anesthesia Type: General ?Level of consciousness: awake ?Pain management: pain level controlled ?Vital Signs Assessment: post-procedure vital signs reviewed and stable ?Respiratory status: spontaneous breathing and respiratory function stable ?Cardiovascular status: blood pressure returned to baseline and stable ?Postop Assessment: no headache and no apparent nausea or vomiting ?Anesthetic complications: no ?Comments: Late entry ? ? ?No notable events documented. ? ? ?Last Vitals:  ?Vitals:  ? 03/04/22 0930 03/04/22 0945  ?BP: 108/68 118/67  ?Pulse: 81 69  ?Resp: 18 13  ?Temp:    ?SpO2: 100% 100%  ?  ?Last Pain:  ?Vitals:  ? 03/04/22 0945  ?TempSrc:   ?PainSc: 0-No pain  ? ? ?  ?  ?  ?  ?  ?  ? ?Louann Sjogren ? ? ? ? ?

## 2022-03-04 NOTE — Addendum Note (Signed)
Addendum  created 03/04/22 1407 by Myna Bright, CRNA  ? Intraprocedure Staff edited  ?  ?

## 2022-03-04 NOTE — Anesthesia Preprocedure Evaluation (Signed)
Anesthesia Evaluation  ?Patient identified by MRN, date of birth, ID band ?Patient awake ? ? ? ?Reviewed: ?Allergy & Precautions, H&P , NPO status , Patient's Chart, lab work & pertinent test results, reviewed documented beta blocker date and time  ? ?Airway ?Mallampati: II ? ?TM Distance: >3 FB ?Neck ROM: full ? ? ? Dental ?no notable dental hx. ? ?  ?Pulmonary ?neg pulmonary ROS, Current Smoker,  ?  ?Pulmonary exam normal ?breath sounds clear to auscultation ? ? ? ? ? ? Cardiovascular ?Exercise Tolerance: Good ?hypertension, negative cardio ROS ? ? ?Rhythm:regular Rate:Normal ? ? ?  ?Neuro/Psych ?PSYCHIATRIC DISORDERS negative neurological ROS ?   ? GI/Hepatic ?negative GI ROS, Neg liver ROS,   ?Endo/Other  ?diabetes, Type 2 ? Renal/GU ?negative Renal ROS  ?negative genitourinary ?  ?Musculoskeletal ? ? Abdominal ?  ?Peds ? Hematology ?negative hematology ROS ?(+)   ?Anesthesia Other Findings ? ? Reproductive/Obstetrics ?negative OB ROS ? ?  ? ? ? ? ? ? ? ? ? ? ? ? ? ?  ?  ? ? ? ? ? ? ? ? ?Anesthesia Physical ?Anesthesia Plan ? ?ASA: 2 ? ?Anesthesia Plan: General and General LMA  ? ?Post-op Pain Management:   ? ?Induction:  ? ?PONV Risk Score and Plan: Ondansetron ? ?Airway Management Planned:  ? ?Additional Equipment:  ? ?Intra-op Plan:  ? ?Post-operative Plan:  ? ?Informed Consent: I have reviewed the patients History and Physical, chart, labs and discussed the procedure including the risks, benefits and alternatives for the proposed anesthesia with the patient or authorized representative who has indicated his/her understanding and acceptance.  ? ? ? ?Dental Advisory Given ? ?Plan Discussed with: CRNA ? ?Anesthesia Plan Comments:   ? ? ? ? ? ? ?Anesthesia Quick Evaluation ? ?

## 2022-03-04 NOTE — Op Note (Signed)
Preoperative diagnosis: Postmenopausal bleeding  ?                                       Thickened endometrium, benign pathology in office ?                                       Endometrial polyp ?  ?Postoperative diagnosis: Endometrial polyp x 1 and submucosal myoma  ? ?Procedure: Operative hysteroscopy with removal of 1 polyp, removal of submucosal myoma and endometrial curettage  ? ?Surgeon: Florian Buff  ? ?Anesthesia: Laryngeal mask airway  ? ?Findings: The patient experienced postmenopausal bleeding and we did a sonogram in the office which revealed a thickened endometrial stripe suggesting a polyp ?An office endometrial biopsy was performed and revealed a benign endometrial polyp, but since her bleeding has continued she opts for removal now ?  ?This morning the patient had 1 relatively large polyp  and a submucosal myoma, 2 cm clinically insignificant. The endometrium itself appeared to be normal.  ? ?Description of operation: Patient was taken to the operating room and placed in the supine position where she underwent laryngeal mask airway anesthesia. She was placed in the dorsal lithotomy position.  ?She was prepped and draped in the usual sterile fashion.  ?A Graves speculum was placed.  ?The cervix was grasped with a single-tooth tenaculum.  ?The cervix was dilated serially using Hegar dilators to allow passage of the hysteroscope.  ?The hysteroscope was then placed into the endometrial cavity without difficulty and the above noted findings were seen.  ?Randall stone forceps ring forceps and the uterine curet were used to remove the polyp ?The endometrium was thoroughly curetted with good uterine cry in all areas  ?Hysteroscopic reevaluation confirmed the removal of all endometrial pathology  ?There was good hemostasis with only suspected minor endometrial using  ?The patient tolerated the procedure well  ?She experienced minimal blood loss  ?She was taken to the recovery room in good stable condition   ?All counts were correct x3  ?She received 2 g of Ancef and 30 mg of Toradol preoperatively  ?She will be followed up in the office next week for postoperative visit and to review the pathology which I expect to be benign ? ?Florian Buff, MD ?03/04/2022 ?9:19 AM ? ? ? ?

## 2022-03-04 NOTE — Transfer of Care (Signed)
Immediate Anesthesia Transfer of Care Note ? ?Patient: BRETTNEY FICKEN ? ?Procedure(s) Performed: DILATATION AND CURETTAGE /HYSTEROSCOPY (Vagina ) ?Removal of Endometrial Polyp (Vagina ) ? ?Patient Location: PACU ? ?Anesthesia Type:General ? ?Level of Consciousness: awake, alert , oriented and patient cooperative ? ?Airway & Oxygen Therapy: Patient Spontanous Breathing and Patient connected to face mask oxygen ? ?Post-op Assessment: Report given to RN, Post -op Vital signs reviewed and stable and Patient moving all extremities ? ?Post vital signs: Reviewed and stable ? ?Last Vitals:  ?Vitals Value Taken Time  ?BP 109/64 03/04/22 0915  ?Temp 36.9 ?C 03/04/22 0915  ?Pulse 81 03/04/22 0916  ?Resp 17 03/04/22 0916  ?SpO2 100 % 03/04/22 0916  ?Vitals shown include unvalidated device data. ? ?Last Pain:  ?Vitals:  ? 03/04/22 0806  ?TempSrc: Oral  ?PainSc: 0-No pain  ?   ? ?Patients Stated Pain Goal: 8 (03/04/22 5974) ? ?Complications: No notable events documented. ?

## 2022-03-05 ENCOUNTER — Encounter (HOSPITAL_COMMUNITY): Payer: Self-pay | Admitting: Obstetrics & Gynecology

## 2022-03-05 LAB — SURGICAL PATHOLOGY

## 2022-03-06 ENCOUNTER — Ambulatory Visit (INDEPENDENT_AMBULATORY_CARE_PROVIDER_SITE_OTHER): Payer: 59 | Admitting: Family Medicine

## 2022-03-06 ENCOUNTER — Encounter: Payer: Self-pay | Admitting: Family Medicine

## 2022-03-06 ENCOUNTER — Other Ambulatory Visit: Payer: Self-pay

## 2022-03-06 VITALS — BP 127/81 | HR 75 | Resp 16 | Ht 63.0 in | Wt 135.1 lb

## 2022-03-06 DIAGNOSIS — F17200 Nicotine dependence, unspecified, uncomplicated: Secondary | ICD-10-CM | POA: Diagnosis not present

## 2022-03-06 DIAGNOSIS — I10 Essential (primary) hypertension: Secondary | ICD-10-CM

## 2022-03-06 DIAGNOSIS — E1149 Type 2 diabetes mellitus with other diabetic neurological complication: Secondary | ICD-10-CM

## 2022-03-06 DIAGNOSIS — E782 Mixed hyperlipidemia: Secondary | ICD-10-CM | POA: Diagnosis not present

## 2022-03-06 NOTE — Patient Instructions (Addendum)
F/u IN 4 MONHTS, CALL IF YOU NEED ME BEFORE ? ?FASTING LIPID, CMP AND egfr, Hba1c, tsh AND VITD 1 WEEK BEFORE VISIT ? ?TRY TAPERING OFF OF METFORMIN, TAKE EVERY OTHER DAY AFTER THE NEXT 5 WEEKS ? ?Goal for fasting blood sugar ranges from 80 to 120  ? ?CALL AND SEE IF YOUR IINSURANCE COVERS HYPNOSIS FOR SMOKNG CESSATION, LOOK FOR HELP THROUGH QUIT LINE, 1800 QUIT NOW AND MEDICATIONS WHICH I CAN PRESCRIBE, [place]. ?PLEASE LET ME KNOW IF YOU WANT MEDS PRESCRIBED ? ?CONGRATS ON GREAT IMPROVEMENT IN BLOOD SUGAR ? ?Thanks for choosing [facility] we consider it a privelige to serve you. ? ?

## 2022-03-08 ENCOUNTER — Encounter: Payer: Self-pay | Admitting: Family Medicine

## 2022-03-08 NOTE — Assessment & Plan Note (Signed)
DASH diet and commitment to daily physical activity for a minimum of 30 minutes discussed and encouraged, as a part of hypertension management. ?The importance of attaining a healthy weight is also discussed. ? ?BP/Weight 03/06/2022 03/04/2022 03/02/2022 02/03/2022 01/23/2022 12/01/2021 10/29/2021  ?Systolic BP 073 710 626 948 142 - 131  ?Diastolic BP 81 91 71 80 84 - 83  ?Wt. (Lbs) 135.12 134.92 135 136 140 146 146.3  ?BMI 23.94 23.9 23.91 24.09 24.8 25.86 25.92  ? ? ? ?Controlled, no change in medication ? ?

## 2022-03-08 NOTE — Assessment & Plan Note (Signed)
Asked:confirms currently smokes cigarettes ?Assess: Unwilling to set a quit date, but Is trying to cut back ?Advise: needs to QUIT to reduce risk of cancer, cardio and cerebrovascular disease ?Assist: counseled for 5 minutes and literature provided ?Arrange: follow up in 2 to 4 months ? ?

## 2022-03-08 NOTE — Progress Notes (Signed)
? ?Andrea Simon     MRN: [id number]      DOB: 01/20/1956 ? ? ?HPI ?[name] is here for follow up and re-evaluation of chronic medical conditions, medication management and review of any available recent lab and radiology data.  ?Preventive health is updated, specifically  Cancer screening and Immunization.   ?Questions or concerns regarding consultations or procedures which the PT has had in the interim are  addressed. ?The PT denies any adverse reactions to current medications since the last visit.  ?There are no new concerns.  ?There are no specific complaints  ? ?ROS ?Denies recent fever or chills. ?Denies sinus pressure, nasal congestion, ear pain or sore throat. ?Denies chest congestion, productive cough or wheezing. ?Denies chest pains, palpitations and leg swelling ?Denies abdominal pain, nausea, vomiting,diarrhea or constipation.   ?Denies dysuria, frequency, hesitancy or incontinence. ?Denies joint pain, swelling and limitation in mobility. ?Denies headaches, seizures, numbness, or tingling. ?Denies depression, anxiety or insomnia. ?Denies skin break down or rash. ? ? ?PE ? ?BP 127/81   Pulse 75   Resp 16   Ht '5\' 3"'$  (1.6 m)   Wt 135 lb 1.9 oz (61.3 kg)   SpO2 98%   BMI 23.94 kg/m?  ? ?Patient alert and oriented and in no cardiopulmonary distress. ? ?HEENT: No facial asymmetry, EOMI,     Neck supple . ? ?Chest: Clear to auscultation bilaterally. ? ?CVS: S1, S2 no murmurs, no S3.Regular rate. ? ?ABD: Soft non tender.  ? ?Ext: No edema ? ?MS: Adequate ROM spine, shoulders, hips and knees. ? ?Skin: Intact, no ulcerations or rash noted. ? ?Psych: Good eye contact, normal affect. Memory intact not anxious or depressed appearing. ? ?CNS: CN 2-12 intact, power,  normal throughout.no focal deficits noted. ? ? ?Assessment & Plan ? ?Type 2 diabetes mellitus with neurological complications ([place]) ?Marked improvement , try to taper off of metformin ?[name] is reminded of the importance of commitment  to daily physical activity for 30 minutes or more, as able and the need to limit carbohydrate intake to 30 to 60 grams per meal to help with blood sugar control.  ? ?The need to take medication as prescribed, test blood sugar as directed, and to call between visits if there is a concern that blood sugar is uncontrolled is also discussed.  ? ?[name] is reminded of the importance of daily foot exam, annual eye examination, and good blood sugar, blood pressure and cholesterol control. ? ?Diabetic Labs Latest Ref Rng & Units 03/02/2022 01/23/2022 10/22/2021 10/17/2021 10/13/2021  ?HbA1c 4.8 - 5.6 % 6.1(H) 6.2 - - 7.6(H)  ?Microalbumin Not Estab. ug/mL - - - - -  ?Micro/Creat Ratio 0 - 29 mg/g creat - - - 15 -  ?Chol 100 - 199 mg/dL - - - - 147  ?HDL >39 mg/dL - - - - 39(L)  ?Calc LDL 0 - 99 mg/dL - - - - 88  ?Triglycerides 0 - 149 mg/dL - - - - 111  ?Creatinine 0.44 - 1.00 mg/dL 0.72 - 0.80 - 0.84  ? ?BP/Weight 03/06/2022 03/04/2022 03/02/2022 02/03/2022 01/23/2022 12/01/2021 10/29/2021  ?Systolic BP 732 202 542 706 142 - 131  ?Diastolic BP 81 91 71 80 84 - 83  ?Wt. (Lbs) 135.12 134.92 135 136 140 146 146.3  ?BMI 23.94 23.9 23.91 24.09 24.8 25.86 25.92  ? ?Foot/eye exam completion dates Latest Ref Rng & Units 11/20/2021 11/08/2018  ?Eye Exam No Retinopathy No Retinopathy No Retinopathy  ?Foot Form  Completion - - -  ? ? ? ? ? ? ?Essential hypertension ?DASH diet and commitment to daily physical activity for a minimum of 30 minutes discussed and encouraged, as a part of hypertension management. ?The importance of attaining a healthy weight is also discussed. ? ?BP/Weight 03/06/2022 03/04/2022 03/02/2022 02/03/2022 01/23/2022 12/01/2021 10/29/2021  ?Systolic BP 701 779 390 300 142 - 131  ?Diastolic BP 81 91 71 80 84 - 83  ?Wt. (Lbs) 135.12 134.92 135 136 140 146 146.3  ?BMI 23.94 23.9 23.91 24.09 24.8 25.86 25.92  ? ? ? ?Controlled, no change in medication ? ? ?Hyperlipemia ?Hyperlipidemia:Low fat diet discussed and  encouraged. ? ? ?Lipid Panel  ?Lab Results  ?Component Value Date  ? CHOL 147 10/13/2021  ? HDL 39 (L) 10/13/2021  ? [place] 88 10/13/2021  ? TRIG 111 10/13/2021  ? CHOLHDL 3.8 10/13/2021  ? ?Need to start regular exercise ? ? ? ?NICOTINE ADDICTION ?Asked:confirms currently smokes cigarettes ?Assess: Unwilling to set a quit date, but Is trying to cut back ?Advise: needs to QUIT to reduce risk of cancer, cardio and cerebrovascular disease ?Assist: counseled for 5 minutes and literature provided ?Arrange: follow up in 2 to 4 months ? ? ?

## 2022-03-08 NOTE — Assessment & Plan Note (Signed)
Hyperlipidemia:Low fat diet discussed and encouraged. ? ? ?Lipid Panel  ?Lab Results  ?Component Value Date  ? CHOL 147 10/13/2021  ? HDL 39 (L) 10/13/2021  ? [place] 88 10/13/2021  ? TRIG 111 10/13/2021  ? CHOLHDL 3.8 10/13/2021  ? ?Need to start regular exercise ? ? ?

## 2022-03-08 NOTE — Assessment & Plan Note (Signed)
Marked improvement , try to taper off of metformin ?Ms. Vandenbrink is reminded of the importance of commitment to daily physical activity for 30 minutes or more, as able and the need to limit carbohydrate intake to 30 to 60 grams per meal to help with blood sugar control.  ? ?The need to take medication as prescribed, test blood sugar as directed, and to call between visits if there is a concern that blood sugar is uncontrolled is also discussed.  ? ?Ms. Phillippi is reminded of the importance of daily foot exam, annual eye examination, and good blood sugar, blood pressure and cholesterol control. ? ?Diabetic Labs Latest Ref Rng & Units 03/02/2022 01/23/2022 10/22/2021 10/17/2021 10/13/2021  ?HbA1c 4.8 - 5.6 % 6.1(H) 6.2 - - 7.6(H)  ?Microalbumin Not Estab. ug/mL - - - - -  ?Micro/Creat Ratio 0 - 29 mg/g creat - - - 15 -  ?Chol 100 - 199 mg/dL - - - - 147  ?HDL >39 mg/dL - - - - 39(L)  ?Calc LDL 0 - 99 mg/dL - - - - 88  ?Triglycerides 0 - 149 mg/dL - - - - 111  ?Creatinine 0.44 - 1.00 mg/dL 0.72 - 0.80 - 0.84  ? ?BP/Weight 03/06/2022 03/04/2022 03/02/2022 02/03/2022 01/23/2022 12/01/2021 10/29/2021  ?Systolic BP 672 094 709 628 142 - 131  ?Diastolic BP 81 91 71 80 84 - 83  ?Wt. (Lbs) 135.12 134.92 135 136 140 146 146.3  ?BMI 23.94 23.9 23.91 24.09 24.8 25.86 25.92  ? ?Foot/eye exam completion dates Latest Ref Rng & Units 11/20/2021 11/08/2018  ?Eye Exam No Retinopathy No Retinopathy No Retinopathy  ?Foot Form Completion - - -  ? ? ? ? ? ?

## 2022-03-17 ENCOUNTER — Encounter: Payer: Self-pay | Admitting: Obstetrics & Gynecology

## 2022-03-17 ENCOUNTER — Ambulatory Visit (INDEPENDENT_AMBULATORY_CARE_PROVIDER_SITE_OTHER): Payer: 59 | Admitting: Obstetrics & Gynecology

## 2022-03-17 ENCOUNTER — Other Ambulatory Visit: Payer: Self-pay

## 2022-03-17 VITALS — BP 114/76 | HR 76 | Ht 63.0 in | Wt 132.0 lb

## 2022-03-17 DIAGNOSIS — Z9889 Other specified postprocedural states: Secondary | ICD-10-CM

## 2022-03-17 NOTE — Progress Notes (Signed)
?  HPI: ?Patient returns for routine postoperative follow-up having undergone hysteroscopy uterine curettage on 03/04/22.  ?[name] immediate postoperative recovery has been unremarkable. ?Since hospital discharge the patient reports some spotting bleeding. ? ? ?Current Outpatient Medications: ?amLODipine (NORVASC) 5 MG tablet, Take 1 tablet (5 mg total) by mouth daily., Disp: 90 tablet, Rfl: 1 ?aspirin EC 81 MG tablet, Take 1 tablet (81 mg total) by mouth every evening., Disp: 30 tablet, Rfl: 11 ?Blood Glucose Monitoring Suppl (ACCU-CHEK GUIDE ME) w/Device KIT, USE TO CHECK GLUCOSE ONCE DAILY, Disp: 1 kit, Rfl: 0 ?Calcium Carbonate-Vitamin D 600-400 MG-UNIT tablet, Take 1 tablet by mouth daily. , Disp: , Rfl:  ?glucose blood test strip, Use as instructed once daily dx e11.9, Disp: 100 each, Rfl: 5 ?Lancets 30G MISC, Once daily testing dx e11.9, Disp: 100 each, Rfl: 5 ?metFORMIN (GLUCOPHAGE) 500 MG tablet, Take 1 tablet (500 mg total) by mouth daily with breakfast., Disp: 90 tablet, Rfl: 3 ?Multiple Vitamins-Minerals (MULTIVITAMIN WITH MINERALS) tablet, Take 1 tablet by mouth daily., Disp: , Rfl:  ?rosuvastatin (CRESTOR) 10 MG tablet, TAKE 1 TABLET BY MOUTH  DAILY, Disp: 90 tablet, Rfl: 0 ?diphenhydrAMINE (SOMINEX) 25 MG tablet, Take 25 mg by mouth at bedtime. (Patient not taking: Reported on 03/06/2022), Disp: , Rfl:  ?ondansetron (ZOFRAN-ODT) 8 MG disintegrating tablet, Take 1 tablet (8 mg total) by mouth every 8 (eight) hours as needed for nausea or vomiting. (Patient not taking: Reported on 03/17/2022), Disp: 8 tablet, Rfl: 0 ? ?No current facility-administered medications for this visit. ?Facility-Administered Medications Ordered in Other Visits: ?sodium chloride flush (NS) 0.9 % injection 10 mL, 10 mL, Intracatheter, PRN, Derek Jack, MD, 10 mL at 04/26/19 4503 ?sodium chloride flush (NS) 0.9 % injection 10 mL, 10 mL, Intravenous, PRN, Derek Jack, MD, 10 mL at 01/09/21 1430 ? ? ? ? ?Blood  pressure 114/76, pulse 76, height $RemoveBe'5\' 3"'kgCfepXor$  (1.6 m), weight 132 lb (59.9 kg). ? ?Physical Exam: ?Normal post op exam non tender ? ?Diagnostic Tests: ? ? ?Pathology: ?Benign endometrial polyp ? ?Impression: ?  ICD-10-CM   ?1. Post-operative state  Z98.890   ? S/P hysteroscopy with polypectomy, benign  ?  ? ? ? ?  ? ? ?Plan: ?No further management is requires ? ? ? ?Follow up: ?prn ? ? ?Florian Buff, MD ? ?

## 2022-04-28 ENCOUNTER — Other Ambulatory Visit (HOSPITAL_COMMUNITY): Payer: 59

## 2022-04-29 ENCOUNTER — Inpatient Hospital Stay (HOSPITAL_COMMUNITY): Payer: 59 | Attending: Hematology

## 2022-04-29 DIAGNOSIS — E785 Hyperlipidemia, unspecified: Secondary | ICD-10-CM | POA: Diagnosis not present

## 2022-04-29 DIAGNOSIS — Z8249 Family history of ischemic heart disease and other diseases of the circulatory system: Secondary | ICD-10-CM | POA: Diagnosis not present

## 2022-04-29 DIAGNOSIS — I10 Essential (primary) hypertension: Secondary | ICD-10-CM | POA: Diagnosis not present

## 2022-04-29 DIAGNOSIS — R970 Elevated carcinoembryonic antigen [CEA]: Secondary | ICD-10-CM | POA: Diagnosis not present

## 2022-04-29 DIAGNOSIS — E559 Vitamin D deficiency, unspecified: Secondary | ICD-10-CM | POA: Insufficient documentation

## 2022-04-29 DIAGNOSIS — C184 Malignant neoplasm of transverse colon: Secondary | ICD-10-CM | POA: Diagnosis present

## 2022-04-29 DIAGNOSIS — Z801 Family history of malignant neoplasm of trachea, bronchus and lung: Secondary | ICD-10-CM | POA: Diagnosis not present

## 2022-04-29 DIAGNOSIS — G629 Polyneuropathy, unspecified: Secondary | ICD-10-CM | POA: Diagnosis not present

## 2022-04-29 DIAGNOSIS — F17210 Nicotine dependence, cigarettes, uncomplicated: Secondary | ICD-10-CM | POA: Insufficient documentation

## 2022-04-29 DIAGNOSIS — Z809 Family history of malignant neoplasm, unspecified: Secondary | ICD-10-CM | POA: Diagnosis not present

## 2022-04-29 DIAGNOSIS — Z8349 Family history of other endocrine, nutritional and metabolic diseases: Secondary | ICD-10-CM | POA: Diagnosis not present

## 2022-04-29 DIAGNOSIS — Z79899 Other long term (current) drug therapy: Secondary | ICD-10-CM | POA: Diagnosis not present

## 2022-04-29 LAB — COMPREHENSIVE METABOLIC PANEL
ALT: 34 U/L (ref 0–44)
AST: 38 U/L (ref 15–41)
Albumin: 4.6 g/dL (ref 3.5–5.0)
Alkaline Phosphatase: 62 U/L (ref 38–126)
Anion gap: 9 (ref 5–15)
BUN: 10 mg/dL (ref 8–23)
CO2: 25 mmol/L (ref 22–32)
Calcium: 9.5 mg/dL (ref 8.9–10.3)
Chloride: 106 mmol/L (ref 98–111)
Creatinine, Ser: 0.73 mg/dL (ref 0.44–1.00)
GFR, Estimated: >60 mL/min (ref >60)
Glucose, Bld: 94 mg/dL (ref 70–99)
Potassium: 3.4 mmol/L — ABNORMAL LOW (ref 3.5–5.1)
Sodium: 140 mmol/L (ref 135–145)
Total Bilirubin: 1.3 mg/dL — ABNORMAL HIGH (ref 0.3–1.2)
Total Protein: 7.3 g/dL (ref 6.5–8.1)

## 2022-04-29 LAB — CBC WITH DIFFERENTIAL/PLATELET
Abs Immature Granulocytes: 0.01 10*3/uL (ref 0.00–0.07)
Basophils Absolute: 0 10*3/uL (ref 0.0–0.1)
Basophils Relative: 1 %
Eosinophils Absolute: 0 10*3/uL (ref 0.0–0.5)
Eosinophils Relative: 1 %
HCT: 38.2 % (ref 36.0–46.0)
Hemoglobin: 13.4 g/dL (ref 12.0–15.0)
Immature Granulocytes: 0 %
Lymphocytes Relative: 52 %
Lymphs Abs: 3.3 10*3/uL (ref 0.7–4.0)
MCH: 33.8 pg (ref 26.0–34.0)
MCHC: 35.1 g/dL (ref 30.0–36.0)
MCV: 96.2 fL (ref 80.0–100.0)
Monocytes Absolute: 0.3 10*3/uL (ref 0.1–1.0)
Monocytes Relative: 5 %
Neutro Abs: 2.5 10*3/uL (ref 1.7–7.7)
Neutrophils Relative %: 41 %
Platelets: 200 10*3/uL (ref 150–400)
RBC: 3.97 MIL/uL (ref 3.87–5.11)
RDW: 13 % (ref 11.5–15.5)
WBC: 6.2 10*3/uL (ref 4.0–10.5)
nRBC: 0 % (ref 0.0–0.2)

## 2022-04-29 LAB — VITAMIN D 25 HYDROXY (VIT D DEFICIENCY, FRACTURES): Vit D, 25-Hydroxy: 64.77 ng/mL (ref 30–100)

## 2022-04-30 LAB — CEA: CEA: 6.6 ng/mL — ABNORMAL HIGH (ref 0.0–4.7)

## 2022-05-05 ENCOUNTER — Inpatient Hospital Stay (HOSPITAL_BASED_OUTPATIENT_CLINIC_OR_DEPARTMENT_OTHER): Payer: 59 | Admitting: Hematology

## 2022-05-05 VITALS — BP 119/76 | HR 69 | Temp 98.6°F | Resp 18 | Ht 63.0 in | Wt 125.0 lb

## 2022-05-05 DIAGNOSIS — Z122 Encounter for screening for malignant neoplasm of respiratory organs: Secondary | ICD-10-CM

## 2022-05-05 DIAGNOSIS — C184 Malignant neoplasm of transverse colon: Secondary | ICD-10-CM | POA: Diagnosis not present

## 2022-05-05 DIAGNOSIS — Z87891 Personal history of nicotine dependence: Secondary | ICD-10-CM | POA: Diagnosis not present

## 2022-05-05 NOTE — Patient Instructions (Signed)
[place] at [facility] ?Discharge Instructions ? ?You were seen and examined today by Dr. Delton Coombes. ? ?Dr. Delton Coombes discussed your most recent lab work and everything looks good. CEA tumor marker is 6.6 this is high but is your normal. Dr. Delton Coombes will get you scheduled for CT Abdomen and Pelvis for your cancer and Chest CT for lung screening due to your smoking history. ? ?Follow-up as scheduled in 6 months. ? ? ? ?Thank you for choosing [place] at [facility] to provide your oncology and hematology care.  To afford each patient quality time with our provider, please arrive at least 15 minutes before your scheduled appointment time.  ? ?If you have a lab appointment with the [facility] please come in thru the Main Entrance and check in at the main information desk. ? ?You need to re-schedule your appointment should you arrive 10 or more minutes late.  We strive to give you quality time with our providers, and arriving late affects you and other patients whose appointments are after yours.  Also, if you no show three or more times for appointments you may be dismissed from the clinic at the providers discretion.     ?Again, thank you for choosing [facility].  Our hope is that these requests will decrease the amount of time that you wait before being seen by our physicians.       ?_____________________________________________________________ ? ?Should you have questions after your visit to [facility], please contact our office at [phone] and follow the prompts.  Our office hours are 8:00 a.m. and 4:30 p.m. Monday - Friday.  Please note that voicemails left after 4:00 p.m. may not be returned until the following business day.  We are closed weekends and major holidays.  You do have access to a nurse 24-7, just call the main number to the clinic [phone] and do not press any options, hold on the line and a nurse will  answer the phone.   ? ?For prescription refill requests, have your pharmacy contact our office and allow 72 hours.   ? ?Due to Covid, you will need to wear a mask upon entering the hospital. If you do not have a mask, a mask will be given to you at the Main Entrance upon arrival. For doctor visits, patients may have 1 support person age 73 or older with them. For treatment visits, patients can not have anyone with them due to social distancing guidelines and our immunocompromised population.  ? ?  ?

## 2022-05-05 NOTE — Progress Notes (Signed)
? ?Andrea Simon ?[address] ?[place],  [address] ? ? ?CLINIC:  ?Medical Oncology/Hematology ? ?PCP:  ?Fayrene Helper, MD ?[address] [place] / [place] [address] ?[phone] ? ? ?REASON FOR VISIT:  ?Follow-up for stage III transverse colon adenocarcinoma ? ?PRIOR THERAPY:  ?1. Laparoscopic right colectomy on 01/09/2019. ?2. FOLFOX x 12 cycles from 03/01/2019 to 08/22/2019. ? ?NGS Results: not done ? ?CURRENT THERAPY: surveillance ? ?BRIEF ONCOLOGIC HISTORY:  ?Oncology History  ?Colon cancer ([place])  ?01/09/2019 Initial Diagnosis  ? Colon cancer ([place]) ? ?  ?03/01/2019 -  Chemotherapy  ? The patient had palonosetron (ALOXI) injection 0.25 mg, 0.25 mg, Intravenous,  Once, 12 of 12 cycles ?Administration: 0.25 mg (03/01/2019), 0.25 mg (03/15/2019), 0.25 mg (03/27/2019), 0.25 mg (04/10/2019), 0.25 mg (05/09/2019), 0.25 mg (05/23/2019), 0.25 mg (06/06/2019), 0.25 mg (06/20/2019), 0.25 mg (07/25/2019), 0.25 mg (07/11/2019), 0.25 mg (08/08/2019), 0.25 mg (08/22/2019) ?pegfilgrastim (NEULASTA) injection 6 mg, 6 mg, Subcutaneous, Once, 8 of 8 cycles ?Administration: 6 mg (05/11/2019), 6 mg (05/25/2019), 6 mg (06/08/2019), 6 mg (06/22/2019), 6 mg (07/13/2019), 6 mg (07/27/2019), 6 mg (08/10/2019), 6 mg (08/24/2019) ?leucovorin 600 mg in dextrose 5 % 250 mL infusion, 640 mg, Intravenous,  Once, 12 of 12 cycles ?Administration: 600 mg (03/01/2019), 600 mg (03/15/2019), 640 mg (03/27/2019), 640 mg (04/10/2019), 640 mg (05/09/2019), 640 mg (05/23/2019), 640 mg (06/06/2019), 640 mg (06/20/2019), 640 mg (07/25/2019), 640 mg (07/11/2019), 640 mg (08/08/2019), 640 mg (08/22/2019) ?oxaliplatin (ELOXATIN) 135 mg in dextrose 5 % 500 mL chemo infusion, 85 mg/m2 = 135 mg, Intravenous,  Once, 10 of 10 cycles ?Dose modification: 68 mg/m2 (80 % of original dose 85 mg/m2, Cycle 5, Reason: Provider Judgment), 68 mg/m2 (original dose 85 mg/m2, Cycle 6, Reason: Provider Judgment), 51 mg/m2 (60 % of original dose 85 mg/m2, Cycle 10, Reason: Other (see  comments), Comment: neuropathy) ?Administration: 135 mg (03/01/2019), 135 mg (03/15/2019), 135 mg (03/27/2019), 135 mg (04/10/2019), 110 mg (05/09/2019), 110 mg (05/23/2019), 110 mg (06/06/2019), 110 mg (06/20/2019), 80 mg (07/25/2019), 110 mg (07/11/2019) ?fluorouracil (ADRUCIL) chemo injection 650 mg, 400 mg/m2 = 650 mg, Intravenous,  Once, 12 of 12 cycles ?Administration: 650 mg (03/01/2019), 650 mg (03/15/2019), 650 mg (03/27/2019), 650 mg (04/10/2019), 650 mg (05/09/2019), 650 mg (05/23/2019), 650 mg (06/06/2019), 650 mg (06/20/2019), 650 mg (07/25/2019), 650 mg (07/11/2019), 650 mg (08/08/2019), 650 mg (08/22/2019) ?fluorouracil (ADRUCIL) 3,850 mg in sodium chloride 0.9 % 73 mL chemo infusion, 2,400 mg/m2 = 3,850 mg, Intravenous, 1 Day/Dose, 12 of 12 cycles ?Administration: 3,850 mg (03/01/2019), 3,850 mg (03/15/2019), 3,850 mg (03/27/2019), 3,850 mg (04/10/2019), 3,850 mg (05/09/2019), 3,850 mg (05/23/2019), 3,850 mg (06/06/2019), 3,850 mg (06/20/2019), 3,850 mg (07/25/2019), 3,850 mg (07/11/2019), 3,850 mg (08/08/2019), 3,850 mg (08/22/2019) ? ? for chemotherapy treatment.  ? ?  ? ? ?CANCER STAGING: ?Cancer Staging  ?No matching staging information was found for the patient. ? ?INTERVAL HISTORY:  ?Andrea Simon, a 66 y.o. female, returns for routine follow-up of her stage III transverse colon adenocarcinoma. Andrea Simon was last seen on 10/29/2021.  ? ?Today she reports feeling good. She denies changes in BM and bleeding. The numbness in her feet has improved with the warmer weather, and she denies any associated pain. She continues to smoke .75 ppd. Her appetite is good, and she denies abdominal pain.  ? ?REVIEW OF SYSTEMS:  ?Review of Systems  ?Constitutional:  Negative for appetite change and fatigue.  ?HENT:   Negative for nosebleeds.   ?Respiratory:  Negative for hemoptysis.   ?Gastrointestinal:  Negative for abdominal pain, blood in stool, constipation and diarrhea.  ?Genitourinary:  Negative for hematuria.   ?Neurological:  Positive for  numbness (feet - improved).  ?Hematological:  Does not bruise/bleed easily.  ?All other systems reviewed and are negative. ? ?PAST MEDICAL/SURGICAL HISTORY:  ?Past Medical History:  ?Diagnosis Date  ? Cancer ([place]) 11/2018  ? Phreesia 09/16/2020  ? Hyperlipidemia   ? Hypertension   ? Insomnia due to anxiety and fear 01/27/2019  ? Multinodular goiter   ? Pre-diabetes   ? Prediabetes   ? ?Past Surgical History:  ?Procedure Laterality Date  ? BIOPSY  12/07/2018  ? Procedure: BIOPSY;  Surgeon: Rogene Houston, MD;  [facility];  Service: Endoscopy;;  sigmoid colon  ? COLON RESECTION N/A 01/09/2019  ? Procedure: LAPAROSCOPIC RIGHT HEMICOLECTOMY;  Surgeon: Virl Cagey, MD;  Location: [facility];  Service: General;  Laterality: N/A;  ? COLON SURGERY N/A   ? Phreesia 09/16/2020  ? COLONOSCOPY N/A 12/07/2018  ? Procedure: COLONOSCOPY;  Surgeon: Rogene Houston, MD;  [facility];  Service: Endoscopy;  Laterality: N/A;  12:45  ? COLONOSCOPY WITH PROPOFOL N/A 10/16/2020  ? Procedure: COLONOSCOPY WITH PROPOFOL;  Surgeon: Rogene Houston, MD;  [facility]: [facility];  Service: Endoscopy;  Laterality: N/A;  955  ? DILATION AND CURETTAGE OF UTERUS  2006  ? FNA thyroid  2011 and 2019  ? benign  ? HYSTEROSCOPY WITH D & C N/A 03/04/2022  ? Procedure: DILATATION AND CURETTAGE /HYSTEROSCOPY;  Surgeon: Florian Buff, MD;  Location: [facility];  Service: Gynecology;  Laterality: N/A;  ? POLYPECTOMY  12/07/2018  ? Procedure: POLYPECTOMY;  Surgeon: Rogene Houston, MD;  [facility];  Service: Endoscopy;;  splenic flexure (CS x1)  ? POLYPECTOMY  10/16/2020  ? Procedure: POLYPECTOMY;  Surgeon: Rogene Houston, MD;  Location: [facility];  Service: Endoscopy;;  ? POLYPECTOMY N/A 03/04/2022  ? Procedure: Removal of Endometrial Polyp;  Surgeon: Florian Buff, MD;  Location: [facility];  Service: Gynecology;  Laterality: N/A;  ? PORTACATH PLACEMENT Left 02/15/2019  ? Procedure: INSERTION PORT-A-CATH;  Surgeon:  Virl Cagey, MD;  Location: [facility];  Service: General;  Laterality: Left;  ? [place]  ? ? ?SOCIAL HISTORY:  ?Social History  ? ?Socioeconomic History  ? Marital status: Married  ?  Spouse name: Not on file  ? Number of children: 1  ? Years of education: Not on file  ? Highest education level: Not on file  ?Occupational History  ? Occupation: [employer]   ?Tobacco Use  ? Smoking status: Every Day  ?  Packs/day: 0.75  ?  Years: 45.00  ?  Pack years: 33.75  ?  Types: Cigarettes  ? Smokeless tobacco: Never  ?Vaping Use  ? Vaping Use: Never used  ?Substance and Sexual Activity  ? Alcohol use: Yes  ?  Alcohol/week: 0.0 standard drinks  ?  Comment: occasionally  ? Drug use: No  ? Sexual activity: Yes  ?  Birth control/protection: None, Surgical  ?Other Topics Concern  ? Not on file  ?Social History Narrative  ? Not on file  ? ?Social Determinants of Health  ? ?Financial Resource Strain: Low Risk   ? Difficulty of Paying Living Expenses: Not hard at all  ?Food Insecurity: No Food Insecurity  ? Worried About Charity fundraiser in the Last Year: Never  true  ? Ran Out of Food in the Last Year: Never true  ?Transportation Needs: No Transportation Needs  ? Lack of Transportation (Medical): No  ? Lack of Transportation (Non-Medical): No  ?Physical Activity: Insufficiently Active  ? Days of Exercise per Week: 3 days  ? Minutes of Exercise per Session: 30 min  ?Stress: No Stress Concern Present  ? Feeling of Stress : Only a little  ?Social Connections: Moderately Isolated  ? Frequency of Communication with Friends and Family: More than three times a week  ? Frequency of Social Gatherings with Friends and Family: Three times a week  ? Attends Religious Services: Never  ? Active Member of Clubs or Organizations: No  ? Attends Archivist Meetings: Never  ? Marital Status: Married  ?Intimate Partner Violence: Not At Risk  ? Fear of Current or Ex-Partner: No  ? Emotionally Abused: No  ?  Physically Abused: No  ? Sexually Abused: No  ? ? ?FAMILY HISTORY:  ?Family History  ?Problem Relation Age of Onset  ? Hypertension Mother   ?     AAA  ? Coronary artery disease Mother   ? Hyperlipidemia Mother

## 2022-05-27 ENCOUNTER — Other Ambulatory Visit: Payer: Self-pay | Admitting: Nurse Practitioner

## 2022-06-07 ENCOUNTER — Other Ambulatory Visit: Payer: Self-pay | Admitting: Family Medicine

## 2022-07-10 ENCOUNTER — Ambulatory Visit (INDEPENDENT_AMBULATORY_CARE_PROVIDER_SITE_OTHER): Payer: 59 | Admitting: Family Medicine

## 2022-07-10 ENCOUNTER — Encounter: Payer: Self-pay | Admitting: Family Medicine

## 2022-07-10 VITALS — BP 110/78 | HR 74 | Ht 63.0 in | Wt 124.1 lb

## 2022-07-10 DIAGNOSIS — R9389 Abnormal findings on diagnostic imaging of other specified body structures: Secondary | ICD-10-CM

## 2022-07-10 DIAGNOSIS — I10 Essential (primary) hypertension: Secondary | ICD-10-CM

## 2022-07-10 DIAGNOSIS — Z1231 Encounter for screening mammogram for malignant neoplasm of breast: Secondary | ICD-10-CM

## 2022-07-10 DIAGNOSIS — E1149 Type 2 diabetes mellitus with other diabetic neurological complication: Secondary | ICD-10-CM

## 2022-07-10 DIAGNOSIS — E782 Mixed hyperlipidemia: Secondary | ICD-10-CM | POA: Diagnosis not present

## 2022-07-10 DIAGNOSIS — F17200 Nicotine dependence, unspecified, uncomplicated: Secondary | ICD-10-CM

## 2022-07-10 NOTE — Patient Instructions (Addendum)
Annual exam end Novemebr, call ify ou need me sooner  Fasting HBA1C, lipid, cmp and eGFr and TSH next week Mon/ Tuesday please  BP excellent  Continue to take best care you  can of yourself  Pls schedule November mammogram at checkout   Thanks for choosing [facility] ([facility]), we consider it a privelige to serve you.

## 2022-07-11 ENCOUNTER — Encounter: Payer: Self-pay | Admitting: Family Medicine

## 2022-07-11 NOTE — Assessment & Plan Note (Signed)
Andrea Simon is reminded of the importance of commitment to daily physical activity for 30 minutes or more, as able and the need to limit carbohydrate intake to 30 to 60 grams per meal to help with blood sugar control.   The need to take medication as prescribed, test blood sugar as directed, and to call between visits if there is a concern that blood sugar is uncontrolled is also discussed.   Andrea Simon is reminded of the importance of daily foot exam, annual eye examination, and good blood sugar, blood pressure and cholesterol control. Updated lab needed at/ before next visit.      Latest Ref Rng & Units 04/29/2022   12:42 PM 03/02/2022   12:21 PM 03/02/2022   10:45 AM 01/23/2022    4:37 PM 10/22/2021    9:19 AM  Diabetic Labs  HbA1c 4.8 - 5.6 %  6.1   6.2    Creatinine 0.44 - 1.00 mg/dL 0.73   0.72   0.80       07/10/2022    3:20 PM 05/05/2022    3:35 PM 03/17/2022   10:17 AM 03/06/2022    3:17 PM 03/04/2022   10:08 AM 03/04/2022   10:00 AM 03/04/2022    9:45 AM  BP/Weight  Systolic BP 209 470 962 836 629 476 546  Diastolic BP 78 76 76 81 91 69 67  Wt. (Lbs) 124.12 125 132 135.12     BMI 21.99 kg/m2 22.14 kg/m2 23.38 kg/m2 23.94 kg/m2         Latest Ref Rng & Units 11/20/2021   12:00 AM 11/08/2018   12:00 AM  Foot/eye exam completion dates  Eye Exam No Retinopathy No Retinopathy     No Retinopathy         This result is from an external source.

## 2022-07-11 NOTE — Assessment & Plan Note (Signed)
Asked:confirms currently smokes cigarettes °Assess: Unwilling to set a quit date, but is cutting back °Advise: needs to QUIT to reduce risk of cancer, cardio and cerebrovascular disease °Assist: counseled for 5 minutes and literature provided °Arrange: follow up in 2 to 4 months ° °

## 2022-07-11 NOTE — Assessment & Plan Note (Signed)
DASH diet and commitment to daily physical activity for a minimum of 30 minutes discussed and encouraged, as a part of hypertension management. The importance of attaining a healthy weight is also discussed.     07/10/2022    3:20 PM 05/05/2022    3:35 PM 03/17/2022   10:17 AM 03/06/2022    3:17 PM 03/04/2022   10:08 AM 03/04/2022   10:00 AM 03/04/2022    9:45 AM  BP/Weight  Systolic BP 160 109 323 557 322 025 427  Diastolic BP 78 76 76 81 91 69 67  Wt. (Lbs) 124.12 125 132 135.12     BMI 21.99 kg/m2 22.14 kg/m2 23.38 kg/m2 23.94 kg/m2

## 2022-07-11 NOTE — Progress Notes (Signed)
   Andrea Simon     MRN: [id number]      DOB: 05-Mar-1956   HPI Andrea Simon is here for follow up and re-evaluation of chronic medical conditions, medication management and review of any available recent lab and radiology data.  Preventive health is updated, specifically  Cancer screening and Immunization.   Questions or concerns regarding consultations or procedures which the PT has had in the interim are  addressed. The PT denies any adverse reactions to current medications since the last visit.  There are no new concerns.  There are no specific complaints   ROS Denies recent fever or chills. Denies sinus pressure, nasal congestion, ear pain or sore throat. Denies chest congestion, productive cough or wheezing. Denies chest pains, palpitations and leg swelling Denies abdominal pain, nausea, vomiting,diarrhea or constipation.   Denies dysuria, frequency, hesitancy or incontinence. Denies joint pain, swelling and limitation in mobility. Denies headaches, seizures, numbness, or tingling. Denies depression, anxiety or insomnia. Denies skin break down or rash.   PE  BP 110/78   Pulse 74   Ht '5\' 3"'$  (1.6 m)   Wt 124 lb 1.9 oz (56.3 kg)   SpO2 99%   BMI 21.99 kg/m   Patient alert and oriented and in no cardiopulmonary distress.  HEENT: No facial asymmetry, EOMI,     Neck supple .  Chest: Clear to auscultation bilaterally.  CVS: S1, S2 no murmurs, no S3.Regular rate.  ABD: Soft non tender.   Ext: No edema  MS: Adequate ROM spine, shoulders, hips and knees.  Skin: Intact, no ulcerations or rash noted.  Psych: Good eye contact, normal affect. Memory intact not anxious or depressed appearing.  CNS: CN 2-12 intact, power,  normal throughout.no focal deficits noted.   Assessment & Plan  Essential hypertension DASH diet and commitment to daily physical activity for a minimum of 30 minutes discussed and encouraged, as a part of hypertension management. The importance  of attaining a healthy weight is also discussed.     07/10/2022    3:20 PM 05/05/2022    3:35 PM 03/17/2022   10:17 AM 03/06/2022    3:17 PM 03/04/2022   10:08 AM 03/04/2022   10:00 AM 03/04/2022    9:45 AM  BP/Weight  Systolic BP 354 656 812 751 700 174 944  Diastolic BP 78 76 76 81 91 69 67  Wt. (Lbs) 124.12 125 132 135.12     BMI 21.99 kg/m2 22.14 kg/m2 23.38 kg/m2 23.94 kg/m2          Hyperlipemia Hyperlipidemia:Low fat diet discussed and encouraged.   Lipid Panel  Lab Results  Component Value Date   CHOL 147 10/13/2021   HDL 39 (L) 10/13/2021   LDLCALC 88 10/13/2021   TRIG 111 10/13/2021   CHOLHDL 3.8 10/13/2021     Updated lab needed at/ before next visit.   NICOTINE ADDICTION Asked:confirms currently smokes cigarettes Assess: Unwilling to set a quit date, but is cutting back Advise: needs to QUIT to reduce risk of cancer, cardio and cerebrovascular disease Assist: counseled for 5 minutes and literature provided Arrange: follow up in 2 to 4 months   Thickened endometrium Benign pathology 02/2022

## 2022-07-11 NOTE — Assessment & Plan Note (Signed)
Benign pathology 02/2022

## 2022-07-11 NOTE — Assessment & Plan Note (Signed)
Hyperlipidemia:Low fat diet discussed and encouraged.   Lipid Panel  Lab Results  Component Value Date   CHOL 147 10/13/2021   HDL 39 (L) 10/13/2021   LDLCALC 88 10/13/2021   TRIG 111 10/13/2021   CHOLHDL 3.8 10/13/2021     Updated lab needed at/ before next visit.

## 2022-07-14 LAB — HEMOGLOBIN A1C
Est. average glucose Bld gHb Est-mCnc: 123 mg/dL
Hgb A1c MFr Bld: 5.9 % — ABNORMAL HIGH (ref 4.8–5.6)

## 2022-07-14 LAB — CMP14+EGFR
ALT: 31 IU/L (ref 0–32)
AST: 30 IU/L (ref 0–40)
Albumin/Globulin Ratio: 1.7 (ref 1.2–2.2)
Albumin: 4.6 g/dL (ref 3.9–4.9)
Alkaline Phosphatase: 78 IU/L (ref 44–121)
BUN/Creatinine Ratio: 16 (ref 12–28)
BUN: 14 mg/dL (ref 8–27)
Bilirubin Total: 0.8 mg/dL (ref 0.0–1.2)
CO2: 23 mmol/L (ref 20–29)
Calcium: 10.4 mg/dL — ABNORMAL HIGH (ref 8.7–10.3)
Chloride: 103 mmol/L (ref 96–106)
Creatinine, Ser: 0.85 mg/dL (ref 0.57–1.00)
Globulin, Total: 2.7 g/dL (ref 1.5–4.5)
Glucose: 104 mg/dL — ABNORMAL HIGH (ref 70–99)
Potassium: 4.2 mmol/L (ref 3.5–5.2)
Sodium: 141 mmol/L (ref 134–144)
Total Protein: 7.3 g/dL (ref 6.0–8.5)
eGFR: 76 mL/min/{1.73_m2} (ref >59)

## 2022-07-14 LAB — LIPID PANEL
Chol/HDL Ratio: 3.1 ratio (ref 0.0–4.4)
Cholesterol, Total: 147 mg/dL (ref 100–199)
HDL: 48 mg/dL (ref >39)
LDL Chol Calc (NIH): 83 mg/dL (ref 0–99)
Triglycerides: 86 mg/dL (ref 0–149)
VLDL Cholesterol Cal: 16 mg/dL (ref 5–40)

## 2022-07-14 LAB — TSH: TSH: 1.51 u[IU]/mL (ref 0.450–4.500)

## 2022-08-10 ENCOUNTER — Ambulatory Visit: Payer: 59 | Admitting: Nutrition

## 2022-09-27 ENCOUNTER — Other Ambulatory Visit: Payer: Self-pay | Admitting: Family Medicine

## 2022-10-16 ENCOUNTER — Encounter: Payer: 59 | Admitting: Family Medicine

## 2022-10-21 ENCOUNTER — Ambulatory Visit: Payer: 59 | Admitting: Nutrition

## 2022-11-03 ENCOUNTER — Inpatient Hospital Stay: Payer: 59 | Attending: Hematology

## 2022-11-03 ENCOUNTER — Ambulatory Visit (HOSPITAL_COMMUNITY): Payer: 59

## 2022-11-03 ENCOUNTER — Ambulatory Visit (HOSPITAL_COMMUNITY)
Admission: RE | Admit: 2022-11-03 | Discharge: 2022-11-03 | Disposition: A | Discharge status: Home / Self Care | Payer: 59 | Source: Ambulatory Visit | Attending: Hematology | Admitting: Hematology

## 2022-11-03 DIAGNOSIS — Z8349 Family history of other endocrine, nutritional and metabolic diseases: Secondary | ICD-10-CM | POA: Insufficient documentation

## 2022-11-03 DIAGNOSIS — F17210 Nicotine dependence, cigarettes, uncomplicated: Secondary | ICD-10-CM | POA: Insufficient documentation

## 2022-11-03 DIAGNOSIS — Z8249 Family history of ischemic heart disease and other diseases of the circulatory system: Secondary | ICD-10-CM | POA: Insufficient documentation

## 2022-11-03 DIAGNOSIS — Z79899 Other long term (current) drug therapy: Secondary | ICD-10-CM | POA: Insufficient documentation

## 2022-11-03 DIAGNOSIS — Z801 Family history of malignant neoplasm of trachea, bronchus and lung: Secondary | ICD-10-CM | POA: Insufficient documentation

## 2022-11-03 DIAGNOSIS — E785 Hyperlipidemia, unspecified: Secondary | ICD-10-CM | POA: Insufficient documentation

## 2022-11-03 DIAGNOSIS — I10 Essential (primary) hypertension: Secondary | ICD-10-CM | POA: Insufficient documentation

## 2022-11-03 DIAGNOSIS — C184 Malignant neoplasm of transverse colon: Secondary | ICD-10-CM

## 2022-11-03 DIAGNOSIS — R200 Anesthesia of skin: Secondary | ICD-10-CM | POA: Insufficient documentation

## 2022-11-03 DIAGNOSIS — G629 Polyneuropathy, unspecified: Secondary | ICD-10-CM | POA: Insufficient documentation

## 2022-11-03 DIAGNOSIS — E559 Vitamin D deficiency, unspecified: Secondary | ICD-10-CM | POA: Insufficient documentation

## 2022-11-03 DIAGNOSIS — Z809 Family history of malignant neoplasm, unspecified: Secondary | ICD-10-CM | POA: Insufficient documentation

## 2022-11-03 LAB — COMPREHENSIVE METABOLIC PANEL
ALT: 27 U/L (ref 0–44)
AST: 30 U/L (ref 15–41)
Albumin: 4.5 g/dL (ref 3.5–5.0)
Alkaline Phosphatase: 71 U/L (ref 38–126)
Anion gap: 9 (ref 5–15)
BUN: 14 mg/dL (ref 8–23)
CO2: 27 mmol/L (ref 22–32)
Calcium: 9.8 mg/dL (ref 8.9–10.3)
Chloride: 105 mmol/L (ref 98–111)
Creatinine, Ser: 0.79 mg/dL (ref 0.44–1.00)
GFR, Estimated: >60 mL/min (ref >60)
Glucose, Bld: 126 mg/dL — ABNORMAL HIGH (ref 70–99)
Potassium: 3.7 mmol/L (ref 3.5–5.1)
Sodium: 141 mmol/L (ref 135–145)
Total Bilirubin: 1.1 mg/dL (ref 0.3–1.2)
Total Protein: 7.7 g/dL (ref 6.5–8.1)

## 2022-11-03 LAB — CBC WITH DIFFERENTIAL/PLATELET
Abs Immature Granulocytes: 0.02 10*3/uL (ref 0.00–0.07)
Basophils Absolute: 0 10*3/uL (ref 0.0–0.1)
Basophils Relative: 1 %
Eosinophils Absolute: 0 10*3/uL (ref 0.0–0.5)
Eosinophils Relative: 1 %
HCT: 42.7 % (ref 36.0–46.0)
Hemoglobin: 14.6 g/dL (ref 12.0–15.0)
Immature Granulocytes: 0 %
Lymphocytes Relative: 45 %
Lymphs Abs: 2.9 10*3/uL (ref 0.7–4.0)
MCH: 33.9 pg (ref 26.0–34.0)
MCHC: 34.2 g/dL (ref 30.0–36.0)
MCV: 99.1 fL (ref 80.0–100.0)
Monocytes Absolute: 0.4 10*3/uL (ref 0.1–1.0)
Monocytes Relative: 6 %
Neutro Abs: 3 10*3/uL (ref 1.7–7.7)
Neutrophils Relative %: 47 %
Platelets: 209 10*3/uL (ref 150–400)
RBC: 4.31 MIL/uL (ref 3.87–5.11)
RDW: 13.7 % (ref 11.5–15.5)
WBC: 6.4 10*3/uL (ref 4.0–10.5)
nRBC: 0 % (ref 0.0–0.2)

## 2022-11-03 MED ORDER — IOHEXOL 300 MG/ML  SOLN
100.0000 mL | Freq: Once | INTRAMUSCULAR | Status: AC | PRN
Start: 2022-11-03 — End: 2022-11-03
  Administered 2022-11-03: 100 mL via INTRAVENOUS

## 2022-11-04 LAB — CEA: CEA: 7.5 ng/mL — ABNORMAL HIGH (ref 0.0–4.7)

## 2022-11-10 ENCOUNTER — Inpatient Hospital Stay (HOSPITAL_BASED_OUTPATIENT_CLINIC_OR_DEPARTMENT_OTHER): Payer: 59 | Admitting: Hematology

## 2022-11-10 VITALS — BP 117/79 | HR 76 | Temp 96.2°F | Resp 18 | Ht 63.0 in | Wt 129.9 lb

## 2022-11-10 DIAGNOSIS — Z87891 Personal history of nicotine dependence: Secondary | ICD-10-CM | POA: Diagnosis not present

## 2022-11-10 DIAGNOSIS — I10 Essential (primary) hypertension: Secondary | ICD-10-CM | POA: Diagnosis not present

## 2022-11-10 DIAGNOSIS — R200 Anesthesia of skin: Secondary | ICD-10-CM | POA: Diagnosis not present

## 2022-11-10 DIAGNOSIS — E559 Vitamin D deficiency, unspecified: Secondary | ICD-10-CM | POA: Diagnosis not present

## 2022-11-10 DIAGNOSIS — Z801 Family history of malignant neoplasm of trachea, bronchus and lung: Secondary | ICD-10-CM | POA: Diagnosis not present

## 2022-11-10 DIAGNOSIS — C184 Malignant neoplasm of transverse colon: Secondary | ICD-10-CM

## 2022-11-10 DIAGNOSIS — G629 Polyneuropathy, unspecified: Secondary | ICD-10-CM | POA: Diagnosis not present

## 2022-11-10 DIAGNOSIS — Z79899 Other long term (current) drug therapy: Secondary | ICD-10-CM | POA: Diagnosis not present

## 2022-11-10 DIAGNOSIS — Z809 Family history of malignant neoplasm, unspecified: Secondary | ICD-10-CM | POA: Diagnosis not present

## 2022-11-10 DIAGNOSIS — Z8349 Family history of other endocrine, nutritional and metabolic diseases: Secondary | ICD-10-CM | POA: Diagnosis not present

## 2022-11-10 DIAGNOSIS — Z122 Encounter for screening for malignant neoplasm of respiratory organs: Secondary | ICD-10-CM | POA: Diagnosis not present

## 2022-11-10 DIAGNOSIS — Z8249 Family history of ischemic heart disease and other diseases of the circulatory system: Secondary | ICD-10-CM | POA: Diagnosis not present

## 2022-11-10 DIAGNOSIS — E785 Hyperlipidemia, unspecified: Secondary | ICD-10-CM | POA: Diagnosis not present

## 2022-11-10 DIAGNOSIS — F17210 Nicotine dependence, cigarettes, uncomplicated: Secondary | ICD-10-CM | POA: Diagnosis not present

## 2022-11-10 NOTE — Progress Notes (Signed)
[place], [place] [address]   CLINIC:  Medical Oncology/Hematology  PCP:  Andrea Helper, MD [address] / [place] [address] [phone]   REASON FOR VISIT:  Follow-up for stage III transverse colon adenocarcinoma  PRIOR THERAPY:  1. Laparoscopic right colectomy on 01/09/2019. 2. FOLFOX x 12 cycles from 03/01/2019 to 08/22/2019.  NGS Results: not done  CURRENT THERAPY: surveillance  BRIEF ONCOLOGIC HISTORY:  Oncology History  Colon cancer ([place])  01/09/2019 Initial Diagnosis   Colon cancer ([place])   03/01/2019 - 08/24/2019 Chemotherapy   Patient is on Treatment Plan : COLORECTAL FOLFOX q14d x 6 months       CANCER STAGING:  Cancer Staging  No matching staging information was found for the patient.  INTERVAL HISTORY:  [name], a 66 y.o. female, seen for follow-up of stage III transverse colon adenocarcinoma.  Denies any change in bowels.  Denies any bleeding per rectum or melena.  Neuropathy is stable mostly and at times worse in the right foot going to the right ankle.  She does not have any neuropathic pains.  REVIEW OF SYSTEMS:  Review of Systems  Constitutional:  Negative for appetite change and fatigue.  HENT:   Negative for nosebleeds.   Respiratory:  Negative for hemoptysis.   Gastrointestinal:  Negative for abdominal pain, blood in stool, constipation and diarrhea.  Genitourinary:  Negative for hematuria.   Neurological:  Positive for numbness (Right foot occasionally going to ankle).  Hematological:  Does not bruise/bleed easily.  All other systems reviewed and are negative.   PAST MEDICAL/SURGICAL HISTORY:  Past Medical History:  Diagnosis Date   Cancer ([place]) 11/2018   Phreesia 09/16/2020   Hyperlipidemia    Hypertension    Insomnia due to anxiety and fear 01/27/2019   Multinodular goiter    Pre-diabetes    Prediabetes    Past Surgical History:  Procedure Laterality Date    BIOPSY  12/07/2018   Procedure: BIOPSY;  Surgeon: Rogene Houston, MD;  [facility];  Service: Endoscopy;;  sigmoid colon   COLON RESECTION N/A 01/09/2019   Procedure: LAPAROSCOPIC RIGHT HEMICOLECTOMY;  Surgeon: Virl Cagey, MD;  Location: [facility] OR[facility];  Service: General;  Laterality: N/A;   COLON SURGERY N/A    Phreesia 09/16/2020   COLONOSCOPY N/A 12/07/2018   Procedure: COLONOSCOPY;  Surgeon: Rogene Houston, MD;  Location: [facility];  Service: Endoscopy;  Laterality: N/A;  12:45   COLONOSCOPY WITH PROPOFOL N/A 10/16/2020   Procedure: COLONOSCOPY WITH PROPOFOL;  Surgeon: Rogene Houston, MD;  [facility];  Service: Endoscopy;  Laterality: N/A;  [place] OF UTERUS  2006   FNA thyroid  2011 and 2019   benign   HYSTEROSCOPY WITH D & C N/A 03/04/2022   Procedure: DILATATION AND CURETTAGE /HYSTEROSCOPY;  Surgeon: Florian Buff, MD;  Location: [facility];  Service: Gynecology;  Laterality: N/A;   POLYPECTOMY  12/07/2018   Procedure: POLYPECTOMY;  Surgeon: Rogene Houston, MD;  [facility];  Service: Endoscopy;;  splenic flexure (CS x1)   POLYPECTOMY  10/16/2020   Procedure: POLYPECTOMY;  Surgeon: Rogene Houston, MD;  [facility];  Service: Endoscopy;;   POLYPECTOMY N/A 03/04/2022   Procedure: Removal of Endometrial Polyp;  Surgeon: Florian Buff, MD;  Location: [facility];  Service: Gynecology;  Laterality: N/A;   PORTACATH PLACEMENT Left 02/15/2019  Procedure: INSERTION PORT-A-CATH;  Surgeon: Virl Cagey, MD;  Location: [facility];  Service: General;  Laterality: Left;   TUBAL LIGATION  1992    SOCIAL HISTORY:  Social History   Socioeconomic History   Marital status: Married    Spouse name: Not on file   Number of children: 1   Years of education: Not on file   Highest education level: Not on file  Occupational History   Occupation: Theme park manager - [employer]   Tobacco Use   Smoking status: Every Day     Packs/day: 0.75    Years: 45.00    Total pack years: 33.75    Types: Cigarettes   Smokeless tobacco: Never  Vaping Use   Vaping Use: Never used  Substance and Sexual Activity   Alcohol use: Yes    Alcohol/week: 0.0 standard drinks of alcohol    Comment: occasionally   Drug use: No   Sexual activity: Yes    Birth control/protection: None, Surgical  Other Topics Concern   Not on file  Social History Narrative   Not on file   Social Determinants of Health   Financial Resource Strain: Low Risk  (02/03/2022)   Overall Financial Resource Strain (CARDIA)    Difficulty of Paying Living Expenses: Not hard at all  Food Insecurity: No Food Insecurity (02/03/2022)   Hunger Vital Sign    Worried About Running Out of Food in the Last Year: Never true    Ran Out of Food in the Last Year: Never true  Transportation Needs: No Transportation Needs (02/03/2022)   PRAPARE - Hydrologist (Medical): No    Lack of Transportation (Non-Medical): No  Physical Activity: Insufficiently Active (02/03/2022)   Exercise Vital Sign    Days of Exercise per Week: 3 days    Minutes of Exercise per Session: 30 min  Stress: No Stress Concern Present (02/03/2022)   [place]    Feeling of Stress : Only a little  Social Connections: Moderately Isolated (02/03/2022)   Social Connection and Isolation Panel [NHANES]    Frequency of Communication with Friends and Family: More than three times a week    Frequency of Social Gatherings with Friends and Family: Three times a week    Attends Religious Services: Never    Active Member of Clubs or Organizations: No    Attends Archivist Meetings: Never    Marital Status: Married  Human resources officer Violence: Not At Risk (02/03/2022)   Humiliation, Afraid, Rape, and Kick questionnaire    Fear of Current or Ex-Partner: No    Emotionally Abused: No    Physically Abused: No     Sexually Abused: No    FAMILY HISTORY:  Family History  Problem Relation Age of Onset   Hypertension Mother        AAA   Coronary artery disease Mother    Hyperlipidemia Mother    Cancer Mother        lung   Hypertension Father    Cancer Brother     CURRENT MEDICATIONS:  Current Outpatient Medications  Medication Sig Dispense Refill   amLODipine (NORVASC) 5 MG tablet TAKE 1 TABLET BY MOUTH DAILY 90 tablet 3   aspirin EC 81 MG tablet Take 1 tablet (81 mg total) by mouth every evening. 30 tablet 11   Blood Glucose Monitoring Suppl (ACCU-CHEK GUIDE ME) w/Device KIT USE TO CHECK GLUCOSE ONCE DAILY 1 kit 0  Calcium Carbonate-Vitamin D 600-400 MG-UNIT tablet Take 1 tablet by mouth daily.      diphenhydrAMINE (SOMINEX) 25 MG tablet Take 25 mg by mouth at bedtime.     glucose blood test strip Use as instructed once daily dx e11.9 100 each 5   Lancets 30G MISC Once daily testing dx e11.9 100 each 5   Multiple Vitamins-Minerals (MULTIVITAMIN WITH MINERALS) tablet Take 1 tablet by mouth daily.     ondansetron (ZOFRAN-ODT) 8 MG disintegrating tablet Take 1 tablet (8 mg total) by mouth every 8 (eight) hours as needed for nausea or vomiting. 8 tablet 0   rosuvastatin (CRESTOR) 10 MG tablet TAKE 1 TABLET BY MOUTH DAILY 90 tablet 3   No current facility-administered medications for this visit.   Facility-Administered Medications Ordered in Other Visits  Medication Dose Route Frequency Provider Last Rate Last Admin   sodium chloride flush (NS) 0.9 % injection 10 mL  10 mL Intravenous PRN Derek Jack, MD   10 mL at 01/09/21 1430    ALLERGIES:  No Known Allergies  PHYSICAL EXAM:  Performance status (ECOG): 1 - Symptomatic but completely ambulatory  Vitals:   11/10/22 1540  BP: 117/79  Pulse: 76  Resp: 18  Temp: (!) 96.2 F (35.7 C)  SpO2: 98%   Wt Readings from Last 3 Encounters:  11/10/22 129 lb 14.4 oz (58.9 kg)  07/10/22 124 lb 1.9 oz (56.3 kg)  05/05/22 125 lb  (56.7 kg)   Physical Exam Vitals reviewed.  Constitutional:      Appearance: Normal appearance.  Cardiovascular:     Rate and Rhythm: Normal rate and regular rhythm.     Pulses: Normal pulses.     Heart sounds: Normal heart sounds.  Pulmonary:     Effort: Pulmonary effort is normal.     Breath sounds: Normal breath sounds.  Abdominal:     Palpations: Abdomen is soft. There is no hepatomegaly, splenomegaly or mass.     Tenderness: There is no abdominal tenderness.  Lymphadenopathy:     Lower Body: No right inguinal adenopathy. No left inguinal adenopathy.  Neurological:     General: No focal deficit present.     Mental Status: She is alert and oriented to person, place, and time.  Psychiatric:        Mood and Affect: Mood normal.        Behavior: Behavior normal.      LABORATORY DATA:  I have reviewed the labs as listed.     Latest Ref Rng & Units 11/03/2022    8:03 AM 04/29/2022   12:42 PM 03/02/2022   10:45 AM  CBC  WBC 4.0 - 10.5 K/uL 6.4  6.2  6.6   Hemoglobin 12.0 - 15.0 g/dL 14.6  13.4  14.9   Hematocrit 36.0 - 46.0 % 42.7  38.2  44.5   Platelets 150 - 400 K/uL 209  200  218       Latest Ref Rng & Units 11/03/2022    8:03 AM 07/13/2022    9:30 AM 04/29/2022   12:42 PM  CMP  Glucose 70 - 99 mg/dL 126  104  94   BUN 8 - 23 mg/dL _0 Creatinine 0.44 - 1.00 mg/dL 0.79  0.85  0.73   Sodium 135 - 145 mmol/L 141  141  140   Potassium 3.5 - 5.1 mmol/L 3.7  4.2  3.4   Chloride 98 - 111 mmol/L 105  103  106   CO2 22 - 32 mmol/L _0 Calcium 8.9 - 10.3 mg/dL 9.8  10.4  9.5   Total Protein 6.5 - 8.1 g/dL 7.7  7.3  7.3   Total Bilirubin 0.3 - 1.2 mg/dL 1.1  0.8  1.3   Alkaline Phos 38 - 126 U/L 71  78  62   AST 15 - 41 U/L 30  30  38   ALT 0 - 44 U/L 27  31  34     DIAGNOSTIC IMAGING:  I have independently reviewed the scans and discussed with the patient. CT Abdomen Pelvis W Contrast  Result Date: 11/03/2022 CLINICAL DATA:  Follow-up colon  carcinoma. Previous surgery and systemic therapy. Surveillance. EXAM: CT ABDOMEN AND PELVIS WITH CONTRAST TECHNIQUE: Multidetector CT imaging of the abdomen and pelvis was performed using the standard protocol following bolus administration of intravenous contrast. RADIATION DOSE REDUCTION: This exam was performed according to the departmental dose-optimization program which includes automated exposure control, adjustment of the mA and/or kV according to patient size and/or use of iterative reconstruction technique. CONTRAST:  137m OMNIPAQUE IOHEXOL 300 MG/ML  SOLN COMPARISON:  10/22/2021 FINDINGS: Lower Chest: No acute findings. Hepatobiliary: Near-complete resolution of hepatic steatosis is noted since prior exam. No hepatic masses identified. Cholelithiasis again noted, however there is no evidence of cholecystitis or biliary obstruction. Pancreas:  No mass or inflammatory changes. Spleen: Within normal limits in size and appearance. Adrenals/Urinary Tract: No suspicious masses identified. No evidence of ureteral calculi or hydronephrosis. Stomach/Bowel: No evidence of obstruction, inflammatory process or abnormal fluid collections. No masses identified. Vascular/Lymphatic: No pathologically enlarged lymph nodes. No acute vascular findings. Aortic atherosclerotic calcification incidentally noted. Reproductive: Multiple uterine fibroids are again noted, largest measuring 7 cm. Adnexal regions are unremarkable. Other:  None. Musculoskeletal:  No suspicious bone lesions identified. IMPRESSION: No evidence of recurrent or metastatic carcinoma within the abdomen or pelvis. Near-complete resolution of hepatic steatosis since prior exam. Cholelithiasis. No radiographic evidence of cholecystitis. Stable uterine fibroids, largest measuring 7 cm. Aortic Atherosclerosis (ICD10-I70.0). Electronically Signed   By: JMarlaine HindM.D.   On: 11/03/2022 15:30     ASSESSMENT:  1.  Stage III (PT4BPN1A) transverse colon  adenocarcinoma: -Right colectomy on 01/09/2019, 1/24 lymph nodes positive, no tumor deposits, no perineural invasion, grade 2, tumor extending through serosa into adherent omentum, margins negative. -PET scan on 01/31/2019 shows focal hypermetabolic activity identified in the ileocecal anastomosis with SUV 9. -12 cycles of FOLFOX from 03/01/2019 through 08/22/2019. - Colonoscopy on 10/16/2020: Did not show any major abnormalities.   2.  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  MSI was high by PCR.  BRAF testing was negative. -MLH1 hyper methylation was present.  Thought to be sporadic cancer.   3.  Smoking history: -40+ pack year smoker. -CT low-dose on 12/05/2019 was lung RADS 2 with emphysema. -CT chest on 12/25/2020 was lung RADS 2.   PLAN:  1.  Stage III (PT4BPN1A) transverse colon adenocarcinoma: -No change in bowel habits. - Reviewed labs from 11/03/2022.  CEA 7.5 with normal CBC.  CMP was normal. - CT [facility] on 11/03/2022 with no evidence of recurrence.  Near complete resolution of hepatic steatosis.  Other findings were explained to the patient. - She reportedly made dietary changes with improved hepatic steatosis. - RTC 6 months for follow-up with repeat CEA and labs.  Recommend repeat CTAP in 1 year.   2.  Peripheral neuropathy: -Mild numbness in  the feet is stable.  Occasionally right foot numbness goes to right ankle.   3.  Vitamin D deficiency: -Last vitamin D was 64.  Continue vitamin D supplements.   4.  Smoking history: -Low-dose lung cancer CT scan on 12/25/2021 was lung RADS 2. - We will schedule another scan in January.   Orders placed this encounter:  Orders Placed This Encounter  Procedures   CT CHEST LUNG CA SCREEN LOW DOSE W/O CM   CBC with Differential/Platelet   Comprehensive metabolic panel   CEA      Derek Jack, MD [place] [phone]

## 2022-11-10 NOTE — Patient Instructions (Addendum)
[facility] [place]  Discharge Instructions  You were seen and examined today by Dr. Katragadda.  Dr. Katragadda discussed your most recent lab work which revealed that everything looks good.   Follow-up as scheduled in 6 months with labs.    Thank you for choosing [facility] [place] to provide your oncology and hematology care.   To afford each patient quality time with our provider, please arrive at least 15 minutes before your scheduled appointment time. You may need to reschedule your appointment if you arrive late (10 or more minutes). Arriving late affects you and other patients whose appointments are after yours.  Also, if you miss three or more appointments without notifying the office, you may be dismissed from the clinic at the provider's discretion.    Again, thank you for choosing [facility].  Our hope is that these requests will decrease the amount of time that you wait before being seen by our physicians.   If you have a lab appointment with the Cancer Center please come in thru the Main Entrance and check in at the main information desk.           _____________________________________________________________  Should you have questions after your visit to [facility], please contact our office at [phone] and follow the prompts.  Our office hours are 8:00 a.m. to 4:30 p.m. Monday - Thursday and 8:00 a.m. to 2:30 p.m. Friday.  Please note that voicemails left after 4:00 p.m. may not be returned until the following business day.  We are closed weekends and all major holidays.  You do have access to a nurse 24-7, just call the main number to the clinic [phone] and do not press any options, hold on the line and a nurse will answer the phone.    For prescription refill requests, have your pharmacy contact our office and allow 72 hours.    Masks are optional in the cancer centers. If you would like for  your care team to wear a mask while they are taking care of you, please let them know. You may have one support person who is at least 65 years old accompany you for your appointments.  

## 2022-11-13 ENCOUNTER — Ambulatory Visit (HOSPITAL_COMMUNITY)
Admission: RE | Admit: 2022-11-13 | Discharge: 2022-11-13 | Disposition: A | Discharge status: Home / Self Care | Payer: 59 | Source: Ambulatory Visit | Attending: Family Medicine | Admitting: Family Medicine

## 2022-11-13 ENCOUNTER — Encounter (HOSPITAL_COMMUNITY): Payer: Self-pay

## 2022-11-13 ENCOUNTER — Encounter: Payer: 59 | Admitting: Family Medicine

## 2022-11-13 DIAGNOSIS — Z1231 Encounter for screening mammogram for malignant neoplasm of breast: Secondary | ICD-10-CM | POA: Diagnosis present

## 2022-11-17 ENCOUNTER — Encounter: Payer: Self-pay | Admitting: Family Medicine

## 2022-11-17 ENCOUNTER — Ambulatory Visit (INDEPENDENT_AMBULATORY_CARE_PROVIDER_SITE_OTHER): Payer: 59 | Admitting: Family Medicine

## 2022-11-17 VITALS — BP 110/70 | HR 81 | Ht 63.0 in | Wt 130.0 lb

## 2022-11-17 DIAGNOSIS — Z780 Asymptomatic menopausal state: Secondary | ICD-10-CM

## 2022-11-17 DIAGNOSIS — E1149 Type 2 diabetes mellitus with other diabetic neurological complication: Secondary | ICD-10-CM

## 2022-11-17 DIAGNOSIS — F17200 Nicotine dependence, unspecified, uncomplicated: Secondary | ICD-10-CM

## 2022-11-17 DIAGNOSIS — Z0000 Encounter for general adult medical examination without abnormal findings: Secondary | ICD-10-CM | POA: Diagnosis not present

## 2022-11-17 DIAGNOSIS — E782 Mixed hyperlipidemia: Secondary | ICD-10-CM

## 2022-11-17 DIAGNOSIS — E559 Vitamin D deficiency, unspecified: Secondary | ICD-10-CM

## 2022-11-17 DIAGNOSIS — Z23 Encounter for immunization: Secondary | ICD-10-CM

## 2022-11-17 DIAGNOSIS — E041 Nontoxic single thyroid nodule: Secondary | ICD-10-CM

## 2022-11-17 DIAGNOSIS — I10 Essential (primary) hypertension: Secondary | ICD-10-CM | POA: Diagnosis not present

## 2022-11-17 NOTE — Patient Instructions (Addendum)
F/u in 6 months, call if you need me before  Keep up great work  Microalb today  Only thing you gotta work on is cigarettes, and get shingrix vaccines   CBC, lipid, cmoand EGFr, hBA1C, TSH and vit D  Flu vaccine today  Dexa is being scheduled will notify you of appt via my chart if not at checkout  Thanks for choosing [facility], we consider it a privelige to serve you.

## 2022-11-17 NOTE — Assessment & Plan Note (Signed)
Asked:confirms currently smokes cigarettes between 15 to 98 / day in 10/2022 Assess: Unwilling to set a quit date, but is trying to cut back Advise: needs to QUIT to reduce risk of cancer, cardio and cerebrovascular disease Assist: counseled for 5 minutes and literature provided Arrange: follow up in 2 to 4 months

## 2022-11-17 NOTE — Assessment & Plan Note (Addendum)
Updated lab needed at/ before next visit. Andrea Simon is reminded of the importance of commitment to daily physical activity for 30 minutes or more, as able and the need to limit carbohydrate intake to 30 to 60 grams per meal to help with blood sugar control.   .   Andrea Simon is reminded of the importance of daily foot exam, annual eye examination, and good blood sugar, blood pressure and cholesterol control.     Latest Ref Rng & Units 11/03/2022    8:03 AM 07/13/2022    9:30 AM 04/29/2022   12:42 PM 03/02/2022   12:21 PM 03/02/2022   10:45 AM  Diabetic Labs  HbA1c 4.8 - 5.6 %  5.9   6.1    Chol 100 - 199 mg/dL  147      HDL >39 mg/dL  48      Calc LDL 0 - 99 mg/dL  83      Triglycerides 0 - 149 mg/dL  86      Creatinine 0.44 - 1.00 mg/dL 0.79  0.85  0.73   0.72       11/17/2022   11:48 AM 11/17/2022   11:16 AM 11/10/2022    3:40 PM 07/10/2022    3:20 PM 05/05/2022    3:35 PM 03/17/2022   10:17 AM 03/06/2022    3:17 PM  BP/Weight  Systolic BP 710 626 948 546 270 350 093  Diastolic BP 70 70 79 78 76 76 81  Wt. (Lbs)  130 129.9 124.12 125 132 135.12  BMI  23.03 kg/m2 23.01 kg/m2 21.99 kg/m2 22.14 kg/m2 23.38 kg/m2 23.94 kg/m2      Latest Ref Rng & Units 11/17/2022   11:00 AM 11/20/2021   12:00 AM  Foot/eye exam completion dates  Eye Exam No Retinopathy  No Retinopathy      Foot Form Completion  Done      This result is from an external source.

## 2022-11-17 NOTE — Progress Notes (Signed)
Andrea Simon     MRN: [id number]      DOB: 1955-12-24  HPI: Patient is in for annual physical exam.smoking cessation is discussed and encouraged, unwilling to set quit date, uses cigarettes for anger management.  Immunization is reviewed , and  updated , she will  get the shingles vaccines , but flu today PE: BP 110/70   Pulse 81   Ht '5\' 3"'$  (1.6 m)   Wt 130 lb (59 kg)   SpO2 97%   BMI 23.03 kg/m   Pleasant  female, alert and oriented x 3, in no cardio-pulmonary distress. Afebrile. HEENT No facial trauma or asymetry. Sinuses non tender.  Extra occullar muscles intact.. External ears normal, . Neck: supple, no adenopathy,JVD or thyromegaly.No bruits.  Chest: Clear to ascultation bilaterally.No crackles or wheezes. Non tender to palpation  Cardiovascular system; Heart sounds normal,  S1 and  S2 ,no S3.  No murmur,   Musculoskeletal exam: Full ROM of spine, hips , shoulders and knees.  Neurologic: Cranial nerves 2 to 12 intact. Power, tone ,  normal throughout. No disturbance in gait. No tremor.  Skin: Intact, no ulceration, erythema , scaling or rash noted. Pigmentation normal throughout  Psych; Normal mood and affect. Judgement and concentration normal   Assessment & Plan:  Encounter for annual physical exam Annual exam as documented. Counseling done  re healthy lifestyle involving commitment to 150 minutes exercise per week, heart healthy diet, and attaining healthy weight.The importance of adequate sleep also discussed. Regular seat belt use and home safety, is also discussed. Changes in health habits are decided on by the patient with goals and time frames  set for achieving them. Immunization and cancer screening needs are specifically addressed at this visit.   Type 2 diabetes mellitus with neurological complications ([place]) Updated lab needed at/ before next visit. Andrea Simon is reminded of the importance of commitment to daily physical activity  for 30 minutes or more, as able and the need to limit carbohydrate intake to 30 to 60 grams per meal to help with blood sugar control.   .   Andrea Simon is reminded of the importance of daily foot exam, annual eye examination, and good blood sugar, blood pressure and cholesterol control.     Latest Ref Rng & Units 11/03/2022    8:03 AM 07/13/2022    9:30 AM 04/29/2022   12:42 PM 03/02/2022   12:21 PM 03/02/2022   10:45 AM  Diabetic Labs  HbA1c 4.8 - 5.6 %  5.9   6.1    Chol 100 - 199 mg/dL  147      HDL >39 mg/dL  48      Calc LDL 0 - 99 mg/dL  83      Triglycerides 0 - 149 mg/dL  86      Creatinine 0.44 - 1.00 mg/dL 0.79  0.85  0.73   0.72       11/17/2022   11:48 AM 11/17/2022   11:16 AM 11/10/2022    3:40 PM 07/10/2022    3:20 PM 05/05/2022    3:35 PM 03/17/2022   10:17 AM 03/06/2022    3:17 PM  BP/Weight  Systolic BP 865 784 696 295 284 132 440  Diastolic BP 70 70 79 78 76 76 81  Wt. (Lbs)  130 129.9 124.12 125 132 135.12  BMI  23.03 kg/m2 23.01 kg/m2 21.99 kg/m2 22.14 kg/m2 23.38 kg/m2 23.94 kg/m2      Latest Ref Rng &  Units 11/17/2022   11:00 AM 11/20/2021   12:00 AM  Foot/eye exam completion dates  Eye Exam No Retinopathy  No Retinopathy      Foot Form Completion  Done      This result is from an external source.        NICOTINE ADDICTION Asked:confirms currently smokes cigarettes between 15 to 30 / day in 10/2022 Assess: Unwilling to set a quit date, but is trying to cut back Advise: needs to QUIT to reduce risk of cancer, cardio and cerebrovascular disease Assist: counseled for 5 minutes and literature provided Arrange: follow up in 2 to 4 months

## 2022-11-17 NOTE — Assessment & Plan Note (Signed)

## 2022-11-18 LAB — CMP14+EGFR
ALT: 25 IU/L (ref 0–32)
AST: 28 IU/L (ref 0–40)
Albumin/Globulin Ratio: 1.9 (ref 1.2–2.2)
Albumin: 4.8 g/dL (ref 3.9–4.9)
Alkaline Phosphatase: 79 IU/L (ref 44–121)
BUN/Creatinine Ratio: 15 (ref 12–28)
BUN: 12 mg/dL (ref 8–27)
Bilirubin Total: 1.1 mg/dL (ref 0.0–1.2)
CO2: 22 mmol/L (ref 20–29)
Calcium: 10.2 mg/dL (ref 8.7–10.3)
Chloride: 102 mmol/L (ref 96–106)
Creatinine, Ser: 0.78 mg/dL (ref 0.57–1.00)
Globulin, Total: 2.5 g/dL (ref 1.5–4.5)
Glucose: 100 mg/dL — ABNORMAL HIGH (ref 70–99)
Potassium: 4.1 mmol/L (ref 3.5–5.2)
Sodium: 140 mmol/L (ref 134–144)
Total Protein: 7.3 g/dL (ref 6.0–8.5)
eGFR: 84 mL/min/{1.73_m2} (ref >59)

## 2022-11-18 LAB — LIPID PANEL
Chol/HDL Ratio: 3.2 ratio (ref 0.0–4.4)
Cholesterol, Total: 168 mg/dL (ref 100–199)
HDL: 52 mg/dL (ref >39)
LDL Chol Calc (NIH): 97 mg/dL (ref 0–99)
Triglycerides: 103 mg/dL (ref 0–149)
VLDL Cholesterol Cal: 19 mg/dL (ref 5–40)

## 2022-11-18 LAB — HEMOGLOBIN A1C
Est. average glucose Bld gHb Est-mCnc: 126 mg/dL
Hgb A1c MFr Bld: 6 % — ABNORMAL HIGH (ref 4.8–5.6)

## 2022-11-19 LAB — MICROALBUMIN / CREATININE URINE RATIO
Creatinine, Urine: 197.2 mg/dL
Microalb/Creat Ratio: 9 mg/g creat (ref 0–29)
Microalbumin, Urine: 18.6 ug/mL

## 2022-11-23 ENCOUNTER — Other Ambulatory Visit (HOSPITAL_COMMUNITY): Payer: 59

## 2022-11-27 ENCOUNTER — Ambulatory Visit (HOSPITAL_COMMUNITY)
Admission: RE | Admit: 2022-11-27 | Discharge: 2022-11-27 | Disposition: A | Discharge status: Home / Self Care | Payer: 59 | Source: Ambulatory Visit | Attending: Family Medicine | Admitting: Family Medicine

## 2022-11-27 DIAGNOSIS — Z780 Asymptomatic menopausal state: Secondary | ICD-10-CM | POA: Insufficient documentation

## 2022-12-28 ENCOUNTER — Ambulatory Visit (HOSPITAL_COMMUNITY)
Admission: RE | Admit: 2022-12-28 | Discharge: 2022-12-28 | Disposition: A | Discharge status: Home / Self Care | Payer: 59 | Source: Ambulatory Visit | Attending: Hematology | Admitting: Hematology

## 2022-12-28 ENCOUNTER — Ambulatory Visit: Payer: 59

## 2022-12-28 DIAGNOSIS — Z87891 Personal history of nicotine dependence: Secondary | ICD-10-CM | POA: Diagnosis present

## 2022-12-28 DIAGNOSIS — C184 Malignant neoplasm of transverse colon: Secondary | ICD-10-CM

## 2022-12-28 DIAGNOSIS — Z122 Encounter for screening for malignant neoplasm of respiratory organs: Secondary | ICD-10-CM | POA: Insufficient documentation

## 2022-12-30 ENCOUNTER — Other Ambulatory Visit: Payer: Self-pay

## 2022-12-30 DIAGNOSIS — Z87891 Personal history of nicotine dependence: Secondary | ICD-10-CM

## 2022-12-30 DIAGNOSIS — Z122 Encounter for screening for malignant neoplasm of respiratory organs: Secondary | ICD-10-CM

## 2022-12-30 NOTE — Progress Notes (Signed)
Patient notified of LDCT Lung Cancer Screening Results via telephone with the recommendation to follow-up in 6 months. Patient's referring provider has been sent a copy of results. Results are as follows:   IMPRESSION: 1. Lung-RADS 3, probably benign findings. Short-term follow-up in 6 months is recommended with repeat low-dose chest CT without contrast (please use the following order, CT CHEST LCS NODULE FOLLOW-UP W/O CM). 2. Mild coronary artery calcifications. 3. Aortic Atherosclerosis (ICD10-I70.0) and Emphysema (ICD10-J43.9).

## 2022-12-30 NOTE — Progress Notes (Signed)
LCS Nodule Follow-up LDCT order placed per protocol.

## 2023-03-24 NOTE — Progress Notes (Signed)
[place] papers competed and faxed with confirmation.   Fax no. [phone]

## 2023-04-14 NOTE — Progress Notes (Signed)
Attempted to call patient to schedule follow-up LDCT. Unable to reach patient directly, detailed VM left asking that the patient return my call.

## 2023-04-26 ENCOUNTER — Other Ambulatory Visit: Payer: Self-pay | Admitting: Family Medicine

## 2023-05-04 ENCOUNTER — Inpatient Hospital Stay: Payer: 59

## 2023-05-06 ENCOUNTER — Inpatient Hospital Stay: Payer: 59 | Attending: Hematology

## 2023-05-06 DIAGNOSIS — Z8349 Family history of other endocrine, nutritional and metabolic diseases: Secondary | ICD-10-CM | POA: Diagnosis not present

## 2023-05-06 DIAGNOSIS — G629 Polyneuropathy, unspecified: Secondary | ICD-10-CM | POA: Diagnosis not present

## 2023-05-06 DIAGNOSIS — Z79899 Other long term (current) drug therapy: Secondary | ICD-10-CM | POA: Diagnosis not present

## 2023-05-06 DIAGNOSIS — F17210 Nicotine dependence, cigarettes, uncomplicated: Secondary | ICD-10-CM | POA: Insufficient documentation

## 2023-05-06 DIAGNOSIS — Z8249 Family history of ischemic heart disease and other diseases of the circulatory system: Secondary | ICD-10-CM | POA: Insufficient documentation

## 2023-05-06 DIAGNOSIS — I10 Essential (primary) hypertension: Secondary | ICD-10-CM | POA: Diagnosis not present

## 2023-05-06 DIAGNOSIS — Z801 Family history of malignant neoplasm of trachea, bronchus and lung: Secondary | ICD-10-CM | POA: Diagnosis not present

## 2023-05-06 DIAGNOSIS — D7589 Other specified diseases of blood and blood-forming organs: Secondary | ICD-10-CM | POA: Diagnosis not present

## 2023-05-06 DIAGNOSIS — E785 Hyperlipidemia, unspecified: Secondary | ICD-10-CM | POA: Diagnosis not present

## 2023-05-06 DIAGNOSIS — C184 Malignant neoplasm of transverse colon: Secondary | ICD-10-CM | POA: Diagnosis present

## 2023-05-06 LAB — COMPREHENSIVE METABOLIC PANEL
ALT: 26 U/L (ref 0–44)
AST: 31 U/L (ref 15–41)
Albumin: 4.5 g/dL (ref 3.5–5.0)
Alkaline Phosphatase: 68 U/L (ref 38–126)
Anion gap: 8 (ref 5–15)
BUN: 14 mg/dL (ref 8–23)
CO2: 26 mmol/L (ref 22–32)
Calcium: 9.7 mg/dL (ref 8.9–10.3)
Chloride: 103 mmol/L (ref 98–111)
Creatinine, Ser: 0.78 mg/dL (ref 0.44–1.00)
GFR, Estimated: >60 mL/min (ref >60)
Glucose, Bld: 112 mg/dL — ABNORMAL HIGH (ref 70–99)
Potassium: 3.6 mmol/L (ref 3.5–5.1)
Sodium: 137 mmol/L (ref 135–145)
Total Bilirubin: 1.2 mg/dL (ref 0.3–1.2)
Total Protein: 7.5 g/dL (ref 6.5–8.1)

## 2023-05-06 LAB — CBC WITH DIFFERENTIAL/PLATELET
Abs Immature Granulocytes: 0.03 10*3/uL (ref 0.00–0.07)
Basophils Absolute: 0.1 10*3/uL (ref 0.0–0.1)
Basophils Relative: 1 %
Eosinophils Absolute: 0 10*3/uL (ref 0.0–0.5)
Eosinophils Relative: 1 %
HCT: 40.2 % (ref 36.0–46.0)
Hemoglobin: 13.8 g/dL (ref 12.0–15.0)
Immature Granulocytes: 0 %
Lymphocytes Relative: 52 %
Lymphs Abs: 3.8 10*3/uL (ref 0.7–4.0)
MCH: 34.4 pg — ABNORMAL HIGH (ref 26.0–34.0)
MCHC: 34.3 g/dL (ref 30.0–36.0)
MCV: 100.2 fL — ABNORMAL HIGH (ref 80.0–100.0)
Monocytes Absolute: 0.4 10*3/uL (ref 0.1–1.0)
Monocytes Relative: 6 %
Neutro Abs: 2.9 10*3/uL (ref 1.7–7.7)
Neutrophils Relative %: 40 %
Platelets: 232 10*3/uL (ref 150–400)
RBC: 4.01 MIL/uL (ref 3.87–5.11)
RDW: 13.2 % (ref 11.5–15.5)
WBC: 7.2 10*3/uL (ref 4.0–10.5)
nRBC: 0 % (ref 0.0–0.2)

## 2023-05-08 LAB — CEA: CEA: 7.9 ng/mL — ABNORMAL HIGH (ref 0.0–4.7)

## 2023-05-11 ENCOUNTER — Encounter: Payer: Self-pay | Admitting: Hematology

## 2023-05-11 ENCOUNTER — Inpatient Hospital Stay (HOSPITAL_BASED_OUTPATIENT_CLINIC_OR_DEPARTMENT_OTHER): Payer: 59 | Admitting: Hematology

## 2023-05-11 VITALS — BP 132/80 | HR 76 | Temp 98.3°F | Resp 16 | Wt 129.3 lb

## 2023-05-11 DIAGNOSIS — C184 Malignant neoplasm of transverse colon: Secondary | ICD-10-CM

## 2023-05-11 NOTE — Patient Instructions (Addendum)
[facility]  Discharge Instructions  You were seen and examined today by Dr. Ellin Saba.  Dr. Ellin Saba discussed your most recent lab work and CT scan which revealed that everything looks stable.  Dr. Ellin Saba is going to repeat CT of your abdomen and pelvis.  Follow-up as scheduled in 6 months.    Thank you for choosing [facility] to provide your oncology and hematology care.   To afford each patient quality time with our provider, please arrive at least 15 minutes before your scheduled appointment time. You may need to reschedule your appointment if you arrive late (10 or more minutes). Arriving late affects you and other patients whose appointments are after yours.  Also, if you miss three or more appointments without notifying the office, you may be dismissed from the clinic at the provider's discretion.    Again, thank you for choosing [facility].  Our hope is that these requests will decrease the amount of time that you wait before being seen by our physicians.   If you have a lab appointment with the Cancer Center - please note that after April 8th, all labs will be drawn in the cancer center.  You do not have to check in or register with the main entrance as you have in the past but will complete your check-in at the cancer center.            _____________________________________________________________  Should you have questions after your visit to [facility], please contact our office at [phone] and follow the prompts.  Our office hours are 8:00 a.m. to 4:30 p.m. Monday - Thursday and 8:00 a.m. to 2:30 p.m. Friday.  Please note that voicemails left after 4:00 p.m. may not be returned until the following business day.  We are closed weekends and all major holidays.  You do have access to a nurse 24-7, just call the main number to the clinic [phone] and do not press any options, hold on  the line and a nurse will answer the phone.    For prescription refill requests, have your pharmacy contact our office and allow 72 hours.    Masks are no longer required in the cancer centers. If you would like for your care team to wear a mask while they are taking care of you, please let them know. You may have one support person who is at least 66 years old accompany you for your appointments.

## 2023-05-11 NOTE — Progress Notes (Signed)
[facility] [address] [place] [address]    Clinic Day:  05/11/2023  Referring physician: Kerri Perches, MD  Patient Care Team: Kerri Perches, MD as PCP - General (Family Medicine)   ASSESSMENT & PLAN:   Assessment: 1.  Stage III (PT4BPN1A) transverse colon adenocarcinoma: -Right colectomy on 01/09/2019, 1/24 lymph nodes positive, no tumor deposits, no perineural invasion, grade 2, tumor extending through serosa into adherent omentum, margins negative. -PET scan on 01/31/2019 shows focal hypermetabolic activity identified in the ileocecal anastomosis with SUV 9. -12 cycles of FOLFOX from 03/01/2019 through 08/22/2019. - Colonoscopy on 10/16/2020: Did not show any major abnormalities.   2.  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  MSI was high by PCR.  BRAF testing was negative. -MLH1 hyper methylation was present.  Thought to be sporadic cancer.   3.  Smoking history: -40+ pack year smoker. -CT low-dose on 12/05/2019 was lung RADS 2 with emphysema. -CT chest on 12/25/2020 was lung RADS 2.    Plan: 1.  Stage III (PT4BPN1A) transverse colon adenocarcinoma: - CTAP on 11/03/2022: No evidence of recurrence.  Near complete resolution of hepatic steatosis. - Denies any change in bowel habits or bleeding per rectum. - Labs from 05/06/2023: Normal LFTs.  Creatinine normal.  CBC grossly normal.  CEA 7.9.  Her CEA is between 6-8.  Most likely smoking-related. - Recommend follow-up in 6 months with repeat CT of the abdomen and pelvis and CEA levels.   2.  Peripheral neuropathy: - Numbness in the feet is stable.  She is not on any medications.   3.  Mild macrocytosis: - MCV slightly high at 100.2.  Will check B12 and folic acid at next visit.   4.  Smoking history: - Current active smoker.  CT low-dose scan on 12/28/2022 was lung RADS 3. - Will repeat CT chest LCS nodule follow-up in 6 months from the last.    Orders Placed This  Encounter  Procedures   CT Abdomen Pelvis W Contrast    Standing Status:   Future    Standing Expiration Date:   05/10/2024    Order Specific Question:   If indicated for the ordered procedure, I authorize the administration of contrast media per Radiology protocol    Answer:   Yes    Order Specific Question:   Does the patient have a contrast media/X-ray dye allergy?    Answer:   No    Order Specific Question:   Preferred imaging location?    Answer:   [facility]    Order Specific Question:   Release to patient    Answer:   Immediate [1]    Order Specific Question:   If indicated for the ordered procedure, I authorize the administration of oral contrast media per Radiology protocol    Answer:   Yes   CBC with Differential/Platelet    Standing Status:   Future    Standing Expiration Date:   05/10/2024    Order Specific Question:   Release to patient    Answer:   Immediate   Comprehensive metabolic panel    Standing Status:   Future    Standing Expiration Date:   05/10/2024    Order Specific Question:   Release to patient    Answer:   Immediate   CEA    Standing Status:   Future    Standing Expiration Date:   05/10/2024   Vitamin B12  Standing Status:   Future    Standing Expiration Date:   05/10/2024    Order Specific Question:   Release to patient    Answer:   Immediate   Folate    Standing Status:   Future    Standing Expiration Date:   05/10/2024    Order Specific Question:   Release to patient    Answer:   Immediate      I,Katie Daubenspeck,acting as a scribe for Doreatha Massed, MD.,have documented all relevant documentation on the behalf of Doreatha Massed, MD,as directed by  Doreatha Massed, MD while in the presence of Doreatha Massed, MD.   I, Doreatha Massed MD, have reviewed the above documentation for accuracy and completeness, and I agree with the above.   Doreatha Massed, MD   5/21/20244:59 PM  CHIEF COMPLAINT:    Diagnosis: stage III transverse colon adenocarcinoma    Cancer Staging  No matching staging information was found for the patient.   Prior Therapy: 1. Laparoscopic right colectomy on 01/09/2019. 2. FOLFOX x 12 cycles from 03/01/2019 to 08/22/2019.  Current Therapy:  surveillance    HISTORY OF PRESENT ILLNESS:   Oncology History  Colon cancer (HCC)  01/09/2019 Initial Diagnosis   Colon cancer (HCC)   03/01/2019 - 08/24/2019 Chemotherapy   Patient is on Treatment Plan : COLORECTAL FOLFOX q14d x 6 months        INTERVAL HISTORY:   Andrea Simon is a 66 y.o. female presenting to clinic today for follow up of stage III transverse colon adenocarcinoma. She was last seen by me on 11/10/22.  Since her last visit, she had lung cancer screening chest CT on 12/28/22 showing: Lung-RADS 3, probably benign findings. 6 month follow up recommended.  Today, she states that she is doing well overall. Her appetite level is at 100%. Her energy level is at 100%.  PAST MEDICAL HISTORY:   Past Medical History: Past Medical History:  Diagnosis Date   Cancer (HCC) 11/2018   Phreesia 09/16/2020   Hyperlipidemia    Hypertension    Insomnia due to anxiety and fear 01/27/2019   Multinodular goiter    Pre-diabetes    Prediabetes     Surgical History: Past Surgical History:  Procedure Laterality Date   BIOPSY  12/07/2018   Procedure: BIOPSY;  Surgeon: Malissa Hippo, MD;  [facility]: [facility];  Service: Endoscopy;;  sigmoid colon   COLON RESECTION N/A 01/09/2019   Procedure: LAPAROSCOPIC RIGHT HEMICOLECTOMY;  Surgeon: Lucretia Roers, MD;  Location: [facility];  Service: General;  Laterality: N/A;   COLON SURGERY N/A    Phreesia 09/16/2020   COLONOSCOPY N/A 12/07/2018   Procedure: COLONOSCOPY;  Surgeon: Malissa Hippo, MD;  Location: [facility];  Service: Endoscopy;  Laterality: N/A;  12:45   COLONOSCOPY WITH PROPOFOL N/A 10/16/2020   Procedure: COLONOSCOPY WITH PROPOFOL;  Surgeon:  Malissa Hippo, MD;  Location: [facility];  Service: Endoscopy;  Laterality: N/A;  955   DILATION AND CURETTAGE OF UTERUS  2006   FNA thyroid  2011 and 2019   benign   HYSTEROSCOPY WITH D & C N/A 03/04/2022   Procedure: DILATATION AND CURETTAGE /HYSTEROSCOPY;  Surgeon: Lazaro Arms, MD;  Location: [facility];  Service: Gynecology;  Laterality: N/A;   POLYPECTOMY  12/07/2018   Procedure: POLYPECTOMY;  Surgeon: Malissa Hippo, MD;  Location: [facility];  Service: Endoscopy;;  splenic flexure (CS x1)   POLYPECTOMY  10/16/2020   Procedure: POLYPECTOMY;  Surgeon: Malissa Hippo, MD;  Location: [facility];  Service: Endoscopy;;   POLYPECTOMY N/A 03/04/2022   Procedure: Removal of Endometrial Polyp;  Surgeon: Lazaro Arms, MD;  Location: [facility];  Service: Gynecology;  Laterality: N/A;   PORTACATH PLACEMENT Left 02/15/2019   Procedure: INSERTION PORT-A-CATH;  Surgeon: Lucretia Roers, MD;  Location: [facility];  Service: General;  Laterality: Left;   TUBAL LIGATION  1992    Social History: Social History   Socioeconomic History   Marital status: Married    Spouse name: Not on file   Number of children: 1   Years of education: Not on file   Highest education level: Not on file  Occupational History   Occupation: Advertising copywriter - [employer]   Tobacco Use   Smoking status: Every Day    Packs/day: 0.75    Years: 45.00    Additional pack years: 0.00    Total pack years: 33.75    Types: Cigarettes   Smokeless tobacco: Never  Vaping Use   Vaping Use: Never used  Substance and Sexual Activity   Alcohol use: Yes    Alcohol/week: 0.0 standard drinks of alcohol    Comment: occasionally   Drug use: No   Sexual activity: Yes    Birth control/protection: None, Surgical  Other Topics Concern   Not on file  Social History Narrative   Not on file   Social Determinants of Health   Financial Resource Strain: Low Risk  (02/03/2022)   Overall Financial Resource Strain (CARDIA)     Difficulty of Paying Living Expenses: Not hard at all  Food Insecurity: No Food Insecurity (02/03/2022)   Hunger Vital Sign    Worried About Running Out of Food in the Last Year: Never true    Ran Out of Food in the Last Year: Never true  Transportation Needs: No Transportation Needs (02/03/2022)   PRAPARE - Administrator, Civil Service (Medical): No    Lack of Transportation (Non-Medical): No  Physical Activity: Insufficiently Active (02/03/2022)   Exercise Vital Sign    Days of Exercise per Week: 3 days    Minutes of Exercise per Session: 30 min  Stress: No Stress Concern Present (02/03/2022)   [employer] of Occupational Health - Occupational Stress Questionnaire    Feeling of Stress : Only a little  Social Connections: Moderately Isolated (02/03/2022)   Social Connection and Isolation Panel [NHANES]    Frequency of Communication with Friends and Family: More than three times a week    Frequency of Social Gatherings with Friends and Family: Three times a week    Attends Religious Services: Never    Active Member of Clubs or Organizations: No    Attends Banker Meetings: Never    Marital Status: Married  Catering manager Violence: Not At Risk (02/03/2022)   Humiliation, Afraid, Rape, and Kick questionnaire    Fear of Current or Ex-Partner: No    Emotionally Abused: No    Physically Abused: No    Sexually Abused: No    Family History: Family History  Problem Relation Age of Onset   Hypertension Mother        AAA   Coronary artery disease Mother    Hyperlipidemia Mother    Cancer Mother        lung   Hypertension Father    Cancer Brother     Current Medications:  Current Outpatient Medications:    amLODipine (NORVASC) 5 MG tablet,  TAKE 1 TABLET BY MOUTH DAILY, Disp: 90 tablet, Rfl: 3   aspirin EC 81 MG tablet, Take 1 tablet (81 mg total) by mouth every evening., Disp: 30 tablet, Rfl: 11   Blood Glucose Monitoring Suppl (ACCU-CHEK GUIDE ME)  w/Device KIT, USE TO CHECK GLUCOSE ONCE DAILY, Disp: 1 kit, Rfl: 0   Calcium Carbonate-Vitamin D 600-400 MG-UNIT tablet, Take 1 tablet by mouth daily. , Disp: , Rfl:    diphenhydrAMINE (SOMINEX) 25 MG tablet, Take 25 mg by mouth at bedtime., Disp: , Rfl:    glucose blood test strip, Use as instructed once daily dx e11.9, Disp: 100 each, Rfl: 5   Lancets 30G MISC, Once daily testing dx e11.9, Disp: 100 each, Rfl: 5   Multiple Vitamins-Minerals (MULTIVITAMIN WITH MINERALS) tablet, Take 1 tablet by mouth daily., Disp: , Rfl:    rosuvastatin (CRESTOR) 10 MG tablet, TAKE 1 TABLET BY MOUTH DAILY, Disp: 90 tablet, Rfl: 3 No current facility-administered medications for this visit.  Facility-Administered Medications Ordered in Other Visits:    sodium chloride flush (NS) 0.9 % injection 10 mL, 10 mL, Intravenous, PRN, Doreatha Massed, MD, 10 mL at 01/09/21 1430   Allergies: No Known Allergies  REVIEW OF SYSTEMS:   Review of Systems  Constitutional:  Negative for chills, fatigue and fever.  HENT:   Negative for lump/mass, mouth sores, nosebleeds, sore throat and trouble swallowing.   Eyes:  Negative for eye problems.  Respiratory:  Negative for cough and shortness of breath.   Cardiovascular:  Negative for chest pain, leg swelling and palpitations.  Gastrointestinal:  Negative for abdominal pain, constipation, diarrhea, nausea and vomiting.  Genitourinary:  Negative for bladder incontinence, difficulty urinating, dysuria, frequency, hematuria and nocturia.   Musculoskeletal:  Negative for arthralgias, back pain, flank pain, myalgias and neck pain.  Skin:  Negative for itching and rash.  Neurological:  Positive for numbness. Negative for dizziness and headaches.  Hematological:  Does not bruise/bleed easily.  Psychiatric/Behavioral:  Negative for depression, sleep disturbance and suicidal ideas. The patient is not nervous/anxious.   All other systems reviewed and are negative.    VITALS:    Blood pressure 132/80, pulse 76, temperature 98.3 F (36.8 C), temperature source Oral, resp. rate 16, weight 129 lb 4.8 oz (58.7 kg), SpO2 98 %.  Wt Readings from Last 3 Encounters:  05/11/23 129 lb 4.8 oz (58.7 kg)  11/17/22 130 lb (59 kg)  11/10/22 129 lb 14.4 oz (58.9 kg)    Body mass index is 22.9 kg/m.  Performance status (ECOG): 1 - Symptomatic but completely ambulatory  PHYSICAL EXAM:   Physical Exam Vitals and nursing note reviewed. Exam conducted with a chaperone present.  Constitutional:      Appearance: Normal appearance.  Cardiovascular:     Rate and Rhythm: Normal rate and regular rhythm.     Pulses: Normal pulses.     Heart sounds: Normal heart sounds.  Pulmonary:     Effort: Pulmonary effort is normal.     Breath sounds: Normal breath sounds.  Abdominal:     Palpations: Abdomen is soft. There is no hepatomegaly, splenomegaly or mass.     Tenderness: There is no abdominal tenderness.  Musculoskeletal:     Right lower leg: No edema.     Left lower leg: No edema.  Lymphadenopathy:     Cervical: No cervical adenopathy.     Right cervical: No superficial, deep or posterior cervical adenopathy.    Left cervical: No superficial, deep or posterior  cervical adenopathy.     Upper Body:     Right upper body: No supraclavicular or axillary adenopathy.     Left upper body: No supraclavicular or axillary adenopathy.  Neurological:     General: No focal deficit present.     Mental Status: She is alert and oriented to person, place, and time.  Psychiatric:        Mood and Affect: Mood normal.        Behavior: Behavior normal.     LABS:      Latest Ref Rng & Units 05/06/2023   10:52 AM 11/03/2022    8:03 AM 04/29/2022   12:42 PM  CBC  WBC 4.0 - 10.5 K/uL 7.2  6.4  6.2   Hemoglobin 12.0 - 15.0 g/dL 16.1  09.6  04.5   Hematocrit 36.0 - 46.0 % 40.2  42.7  38.2   Platelets 150 - 400 K/uL 232  209  200       Latest Ref Rng & Units 05/06/2023   10:52 AM  11/17/2022    1:12 PM 11/03/2022    8:03 AM  CMP  Glucose 70 - 99 mg/dL 409  811  914   BUN 8 - 23 mg/dL 14  12  14    Creatinine 0.44 - 1.00 mg/dL 7.82  9.56  2.13   Sodium 135 - 145 mmol/L 137  140  141   Potassium 3.5 - 5.1 mmol/L 3.6  4.1  3.7   Chloride 98 - 111 mmol/L 103  102  105   CO2 22 - 32 mmol/L 26  22  27    Calcium 8.9 - 10.3 mg/dL 9.7  08.6  9.8   Total Protein 6.5 - 8.1 g/dL 7.5  7.3  7.7   Total Bilirubin 0.3 - 1.2 mg/dL 1.2  1.1  1.1   Alkaline Phos 38 - 126 U/L 68  79  71   AST 15 - 41 U/L 31  28  30    ALT 0 - 44 U/L 26  25  27       Lab Results  Component Value Date   CEA1 7.9 (H) 05/06/2023   /  CEA  Date Value Ref Range Status  05/06/2023 7.9 (H) 0.0 - 4.7 ng/mL Final    Comment:    (NOTE)                             Nonsmokers          <3.9                             Smokers             <5.6 Roche [employer]ostics Electrochemiluminescence Immunoassay (ECLIA) Values obtained with different assay methods or kits cannot be used interchangeably.  Results cannot be interpreted as absolute evidence of the presence or absence of malignant disease. Performed At: [facility] [address] [place], [place] [id number] Jolene Schimke MD [id number]    No results found for: "PSA1" No results found for: "[id number]" No results found for: "CAN125"  No results found for: "TOTALPROTELP", "ALBUMINELP", "A1GS", "A2GS", "BETS", "BETA2SER", "GAMS", "MSPIKE", "SPEI" No results found for: "TIBC", "FERRITIN", "IRONPCTSAT" No results found for: "LDH"   STUDIES:   No results found.

## 2023-06-01 ENCOUNTER — Ambulatory Visit (INDEPENDENT_AMBULATORY_CARE_PROVIDER_SITE_OTHER): Payer: 59 | Admitting: Family Medicine

## 2023-06-01 ENCOUNTER — Encounter: Payer: Self-pay | Admitting: Family Medicine

## 2023-06-01 ENCOUNTER — Ambulatory Visit: Payer: 59 | Admitting: Family Medicine

## 2023-06-01 VITALS — BP 120/80 | HR 76 | Ht 63.0 in | Wt 129.0 lb

## 2023-06-01 DIAGNOSIS — E041 Nontoxic single thyroid nodule: Secondary | ICD-10-CM

## 2023-06-01 DIAGNOSIS — F17200 Nicotine dependence, unspecified, uncomplicated: Secondary | ICD-10-CM

## 2023-06-01 DIAGNOSIS — Z23 Encounter for immunization: Secondary | ICD-10-CM

## 2023-06-01 DIAGNOSIS — E1149 Type 2 diabetes mellitus with other diabetic neurological complication: Secondary | ICD-10-CM

## 2023-06-01 DIAGNOSIS — I10 Essential (primary) hypertension: Secondary | ICD-10-CM

## 2023-06-01 DIAGNOSIS — E559 Vitamin D deficiency, unspecified: Secondary | ICD-10-CM

## 2023-06-01 DIAGNOSIS — E782 Mixed hyperlipidemia: Secondary | ICD-10-CM

## 2023-06-01 DIAGNOSIS — Z1231 Encounter for screening mammogram for malignant neoplasm of breast: Secondary | ICD-10-CM

## 2023-06-01 NOTE — Patient Instructions (Addendum)
Annual exam 11/24 or after, call if you need me sooner, Zostavax #2 at visit  Fasting lipid, hBA1C, TSH and vit D this week  Zostavax #1 today  Please reach oput once you decide to start a smoking cessation pathway AGAIN, 1800quitnow IS A RESOURCE YOU CAN USE ALL THE TIME  NURSE PLEASE REFER FOR diabetic EYE EXAM TO PROVIDER SHE HAS SEEN IN THE  PAST ( WILL NEED TO RESEARCH THIS, SHE CANNOT RECALL THE NAME, DR IS IN [place])  PLEASE SCHEDULE MAMMOGRAM AT CHECKOUT  Thanks for choosing [facility], we consider it a privelige to serve you.

## 2023-06-02 ENCOUNTER — Encounter: Payer: Self-pay | Admitting: Family Medicine

## 2023-06-02 NOTE — Assessment & Plan Note (Signed)
Hyperlipidemia:Low fat diet discussed and encouraged  Hyperlipidemia:Low fat diet discussed and encouraged.   Lipid Panel  Lab Results  Component Value Date   CHOL 168 11/17/2022   HDL 52 11/17/2022   LDLCALC 97 11/17/2022   TRIG 103 11/17/2022   CHOLHDL 3.2 11/17/2022     Updated lab needed at/ before next visit.

## 2023-06-02 NOTE — Progress Notes (Signed)
Andrea Simon     MRN: [id number]      DOB: [birth year]  Chief Complaint  Patient presents with   Follow-up    Follow up    HPI Andrea Simon is here for follow up and re-evaluation of chronic medical conditions, medication management and review of any available recent lab and radiology data.  Preventive health is updated, specifically  Cancer screening and Immunization.   Questions or concerns regarding consultations or procedures which the PT has had in the interim are  addressed.Great reports as far as  colon cancer is concerned, has rept chest scan next month to f/u nodule The PT denies any adverse reactions to current medications since the last visit.  Currently smokes 1 PPD, unwilling to set a quit date, but is considering it, will reach out for additional help when she decides  ROS Denies recent fever or chills. Denies sinus pressure, nasal congestion, ear pain or sore throat. Denies chest congestion, productive cough or wheezing. Denies chest pains, palpitations and leg swelling Denies abdominal pain, nausea, vomiting,diarrhea or constipation.   Denies dysuria, frequency, hesitancy or incontinence. Denies joint pain, swelling and limitation in mobility. Denies headaches, seizures, numbness, or tingling. Denies depression, anxiety or insomnia. Denies skin break down or rash.   PE  BP 120/80   Pulse 76   Ht 5\' 3"  (1.6 m)   Wt 129 lb 0.6 oz (58.5 kg)   SpO2 95%   BMI 22.86 kg/m   Patient alert and oriented and in no cardiopulmonary distress.  HEENT: No facial asymmetry, EOMI,     Neck supple .  Chest: Clear to auscultation bilaterally.  CVS: S1, S2 no murmurs, no S3.Regular rate.  ABD: Soft non tender.   Ext: No edema  MS: Adequate ROM spine, shoulders, hips and knees.  Skin: Intact, no ulcerations or rash noted.  Psych: Good eye contact, normal affect. Memory intact not anxious or depressed appearing.  CNS: CN 2-12 intact, power,  normal throughout.no  focal deficits noted.   Assessment & Plan  Essential hypertension Controlled, no change in medication DASH diet and commitment to daily physical activity for a minimum of 30 minutes discussed and encouraged, as a part of hypertension management. The importance of attaining a healthy weight is also discussed.     06/01/2023    3:48 PM 06/01/2023    3:03 PM 05/11/2023    3:06 PM 11/17/2022   11:48 AM 11/17/2022   11:16 AM 11/10/2022    3:40 PM 07/10/2022    3:20 PM  BP/Weight  Systolic BP 120 103 132 110 118 117 110  Diastolic BP 80 68 80 70 70 79 78  Wt. (Lbs)  129.04 129.3  130 129.9 124.12  BMI  22.86 kg/m2 22.9 kg/m2  23.03 kg/m2 23.01 kg/m2 21.99 kg/m2       Hyperlipemia Hyperlipidemia:Low fat diet discussed and encouraged  Hyperlipidemia:Low fat diet discussed and encouraged.   Lipid Panel  Lab Results  Component Value Date   CHOL 168 11/17/2022   HDL 52 11/17/2022   LDLCALC 97 11/17/2022   TRIG 103 11/17/2022   CHOLHDL 3.2 11/17/2022     Updated lab needed at/ before next visit.   NICOTINE ADDICTION Unchanged Asked:confirms currently smokes cigarettes, 1 PPD Assess: Unwilling to set a quit date,  is not currently  cutting back, but open to workin on quitting again Advise: needs to QUIT to reduce risk of cancer, cardio and cerebrovascular disease Assist: counseled for 5 minutes  Arrange: follow up in 2 to 6 months   Type 2 diabetes mellitus with neurological complications (HCC) Andrea Simon is reminded of the importance of commitment to daily physical activity for 30 minutes or more, as able and the need to limit carbohydrate intake to 30 to 60 grams per meal to help with blood sugar control.      Andrea Simon is reminded of the importance of daily foot exam, annual eye examination, and good blood sugar, blood pressure and cholesterol control.     Latest Ref Rng & Units 05/06/2023   10:52 AM 11/17/2022    1:14 PM 11/17/2022    1:12 PM 11/03/2022     8:03 AM 07/13/2022    9:30 AM  Diabetic Labs  HbA1c 4.8 - 5.6 %   6.0   5.9   Micro/Creat Ratio 0 - 29 mg/g creat  9      Chol 100 - 199 mg/dL   865   784   HDL >69 mg/dL   52   48   Calc LDL 0 - 99 mg/dL   97   83   Triglycerides 0 - 149 mg/dL   629   86   Creatinine 0.44 - 1.00 mg/dL 5.28   4.13  2.44  0.10       06/01/2023    3:48 PM 06/01/2023    3:03 PM 05/11/2023    3:06 PM 11/17/2022   11:48 AM 11/17/2022   11:16 AM 11/10/2022    3:40 PM 07/10/2022    3:20 PM  BP/Weight  Systolic BP 120 103 132 110 118 117 110  Diastolic BP 80 68 80 70 70 79 78  Wt. (Lbs)  129.04 129.3  130 129.9 124.12  BMI  22.86 kg/m2 22.9 kg/m2  23.03 kg/m2 23.01 kg/m2 21.99 kg/m2      Latest Ref Rng & Units 11/17/2022   11:00 AM 11/20/2021   12:00 AM  Foot/eye exam completion dates  Eye Exam No Retinopathy  No Retinopathy      Foot Form Completion  Done      This result is from an external source.      Updated lab needed at/ before next visit.

## 2023-06-02 NOTE — Assessment & Plan Note (Signed)
Unchanged Asked:confirms currently smokes cigarettes, 1 PPD Assess: Unwilling to set a quit date,  is not currently  cutting back, but open to workin on quitting again Advise: needs to QUIT to reduce risk of cancer, cardio and cerebrovascular disease Assist: counseled for 5 minutes  Arrange: follow up in 2 to 6 months

## 2023-06-02 NOTE — Assessment & Plan Note (Signed)
Controlled, no change in medication DASH diet and commitment to daily physical activity for a minimum of 30 minutes discussed and encouraged, as a part of hypertension management. The importance of attaining a healthy weight is also discussed.     06/01/2023    3:48 PM 06/01/2023    3:03 PM 05/11/2023    3:06 PM 11/17/2022   11:48 AM 11/17/2022   11:16 AM 11/10/2022    3:40 PM 07/10/2022    3:20 PM  BP/Weight  Systolic BP 120 103 132 110 118 117 110  Diastolic BP 80 68 80 70 70 79 78  Wt. (Lbs)  129.04 129.3  130 129.9 124.12  BMI  22.86 kg/m2 22.9 kg/m2  23.03 kg/m2 23.01 kg/m2 21.99 kg/m2

## 2023-06-02 NOTE — Assessment & Plan Note (Signed)
Ms. Bost is reminded of the importance of commitment to daily physical activity for 30 minutes or more, as able and the need to limit carbohydrate intake to 30 to 60 grams per meal to help with blood sugar control.      Ms. Saxton is reminded of the importance of daily foot exam, annual eye examination, and good blood sugar, blood pressure and cholesterol control.     Latest Ref Rng & Units 05/06/2023   10:52 AM 11/17/2022    1:14 PM 11/17/2022    1:12 PM 11/03/2022    8:03 AM 07/13/2022    9:30 AM  Diabetic Labs  HbA1c 4.8 - 5.6 %   6.0   5.9   Micro/Creat Ratio 0 - 29 mg/g creat  9      Chol 100 - 199 mg/dL   401   027   HDL >25 mg/dL   52   48   Calc LDL 0 - 99 mg/dL   97   83   Triglycerides 0 - 149 mg/dL   366   86   Creatinine 0.44 - 1.00 mg/dL 4.40   3.47  4.25  9.56       06/01/2023    3:48 PM 06/01/2023    3:03 PM 05/11/2023    3:06 PM 11/17/2022   11:48 AM 11/17/2022   11:16 AM 11/10/2022    3:40 PM 07/10/2022    3:20 PM  BP/Weight  Systolic BP 120 103 132 110 118 117 110  Diastolic BP 80 68 80 70 70 79 78  Wt. (Lbs)  129.04 129.3  130 129.9 124.12  BMI  22.86 kg/m2 22.9 kg/m2  23.03 kg/m2 23.01 kg/m2 21.99 kg/m2      Latest Ref Rng & Units 11/17/2022   11:00 AM 11/20/2021   12:00 AM  Foot/eye exam completion dates  Eye Exam No Retinopathy  No Retinopathy      Foot Form Completion  Done      This result is from an external source.      Updated lab needed at/ before next visit.

## 2023-06-04 LAB — LIPID PANEL
Chol/HDL Ratio: 2.9 ratio (ref 0.0–4.4)
Cholesterol, Total: 138 mg/dL (ref 100–199)
HDL: 47 mg/dL (ref >39)
LDL Chol Calc (NIH): 72 mg/dL (ref 0–99)
Triglycerides: 100 mg/dL (ref 0–149)
VLDL Cholesterol Cal: 19 mg/dL (ref 5–40)

## 2023-06-04 LAB — HEMOGLOBIN A1C
Est. average glucose Bld gHb Est-mCnc: 131 mg/dL
Hgb A1c MFr Bld: 6.2 % — ABNORMAL HIGH (ref 4.8–5.6)

## 2023-06-04 LAB — VITAMIN D 25 HYDROXY (VIT D DEFICIENCY, FRACTURES): Vit D, 25-Hydroxy: 49.3 ng/mL (ref 30.0–100.0)

## 2023-06-04 LAB — TSH: TSH: 1.75 u[IU]/mL (ref 0.450–4.500)

## 2023-07-06 ENCOUNTER — Ambulatory Visit (HOSPITAL_COMMUNITY)
Admission: RE | Admit: 2023-07-06 | Discharge: 2023-07-06 | Disposition: A | Discharge status: Home / Self Care | Payer: 59 | Source: Ambulatory Visit | Attending: Physician Assistant | Admitting: Physician Assistant

## 2023-07-06 DIAGNOSIS — Z87891 Personal history of nicotine dependence: Secondary | ICD-10-CM

## 2023-07-06 DIAGNOSIS — Z122 Encounter for screening for malignant neoplasm of respiratory organs: Secondary | ICD-10-CM | POA: Diagnosis present

## 2023-07-21 NOTE — Progress Notes (Signed)
Patient notified of LDCT Lung Cancer Screening Results via mail with the recommendation to follow-up in 12 months. Patient's referring provider has been sent a copy of results. Results are as follows:   IMPRESSION: 1. Lung-RADS 2, benign appearance or behavior. Continue annual screening with low-dose chest CT without contrast in 12 months. 2.  Emphysema (ICD10-J43.9) and Aortic Atherosclerosis (ICD10-170.0) 

## 2023-09-15 ENCOUNTER — Encounter (INDEPENDENT_AMBULATORY_CARE_PROVIDER_SITE_OTHER): Payer: Self-pay | Admitting: *Deleted

## 2023-09-21 ENCOUNTER — Other Ambulatory Visit: Payer: Self-pay

## 2023-09-21 ENCOUNTER — Telehealth: Payer: Self-pay | Admitting: Family Medicine

## 2023-09-21 DIAGNOSIS — E1149 Type 2 diabetes mellitus with other diabetic neurological complication: Secondary | ICD-10-CM

## 2023-09-21 MED ORDER — ACCU-CHEK GUIDE ME W/DEVICE KIT
PACK | 0 refills | Status: DC
Start: 2023-09-21 — End: 2024-06-16

## 2023-09-21 MED ORDER — LANCETS 30G MISC
5 refills | Status: AC
Start: 2023-09-21 — End: ?

## 2023-09-21 MED ORDER — AMLODIPINE BESYLATE 5 MG PO TABS: 5.0000 mg | ORAL_TABLET | Freq: Every day | ORAL | 3 refills | Status: DC

## 2023-09-21 MED ORDER — GLUCOSE BLOOD VI STRP
ORAL_STRIP | 5 refills | Status: DC
Start: 2023-09-21 — End: 2024-06-16

## 2023-09-21 MED ORDER — ROSUVASTATIN CALCIUM 10 MG PO TABS: 10.0000 mg | ORAL_TABLET | Freq: Every day | ORAL | 3 refills | Status: DC

## 2023-09-21 NOTE — Telephone Encounter (Signed)
Pharm change  [facility]   Needs all active meds sent to [facility]

## 2023-09-21 NOTE — Telephone Encounter (Signed)
Refills sent to new pharmacy.  

## 2023-09-24 ENCOUNTER — Encounter: Payer: Self-pay | Admitting: Family Medicine

## 2023-09-24 ENCOUNTER — Telehealth (INDEPENDENT_AMBULATORY_CARE_PROVIDER_SITE_OTHER): Payer: Medicare HMO | Admitting: Family Medicine

## 2023-09-24 VITALS — Ht 63.0 in | Wt 130.0 lb

## 2023-09-24 DIAGNOSIS — E782 Mixed hyperlipidemia: Secondary | ICD-10-CM | POA: Diagnosis not present

## 2023-09-24 DIAGNOSIS — I10 Essential (primary) hypertension: Secondary | ICD-10-CM

## 2023-09-24 DIAGNOSIS — U071 COVID-19: Secondary | ICD-10-CM

## 2023-09-24 MED ORDER — AMLODIPINE BESYLATE 5 MG PO TABS
5.0000 mg | ORAL_TABLET | Freq: Every day | ORAL | 3 refills | Status: DC
Start: 2023-09-24 — End: 2023-09-24

## 2023-09-24 MED ORDER — NIRMATRELVIR/RITONAVIR (PAXLOVID)TABLET
3.0000 | ORAL_TABLET | Freq: Two times a day (BID) | ORAL | 0 refills | Status: AC
Start: 2023-09-24 — End: 2023-09-29

## 2023-09-24 MED ORDER — AMLODIPINE BESYLATE 5 MG PO TABS
5.0000 mg | ORAL_TABLET | Freq: Every day | ORAL | 3 refills | Status: DC
Start: 2023-09-24 — End: 2024-10-13

## 2023-09-24 MED ORDER — ROSUVASTATIN CALCIUM 10 MG PO TABS
10.0000 mg | ORAL_TABLET | Freq: Every day | ORAL | 3 refills | Status: DC
Start: 2023-09-24 — End: 2024-09-25

## 2023-09-24 MED ORDER — ROSUVASTATIN CALCIUM 10 MG PO TABS: 10.0000 mg | ORAL_TABLET | Freq: Every day | ORAL | 3 refills | Status: DC

## 2023-09-24 NOTE — Progress Notes (Unsigned)
Virtual Visit via Video Note  I connected with Andrea Simon on 09/26/23 at  8:40 AM EDT by a video enabled telemedicine application and verified that I am speaking with the correct person using two identifiers.  Patient Location: Home Provider Location: Office/Clinic  I discussed the limitations, risks, security, and privacy concerns of performing an evaluation and management service by video and the availability of in person appointments. I also discussed with the patient that there may be a patient responsible charge related to this service. The patient expressed understanding and agreed to proceed.  Subjective: PCP: Kerri Perches, MD  Chief Complaint  Patient presents with   Covid Positive    Pt reports positive covid on 09/22/2023, has sx of body aches, chills, general fatigue, recently traveled went to [place] got back 09/18/23.    Medication Refill    Also asked for amlodipine and rosuvastatin to be sent to [facility] [address]    HPI The patient tested positive for COVID on 09/22/2023 and denies symptoms such as fever, loss of smell or taste, or difficulty breathing. She reports experiencing body aches, general fatigue, and chills. The patient reports recent travel to the [place] and returned to the [place] on 09/18/2023.  ROS: Per HPI  Current Outpatient Medications:    aspirin EC 81 MG tablet, Take 1 tablet (81 mg total) by mouth every evening., Disp: 30 tablet, Rfl: 11   Blood Glucose Monitoring Suppl (ACCU-CHEK GUIDE ME) w/Device KIT, USE TO CHECK GLUCOSE ONCE DAILY, Disp: 1 kit, Rfl: 0   Calcium Carbonate-Vitamin D 600-400 MG-UNIT tablet, Take 1 tablet by mouth daily. , Disp: , Rfl:    diphenhydrAMINE (SOMINEX) 25 MG tablet, Take 25 mg by mouth at bedtime., Disp: , Rfl:    glucose blood test strip, Use as instructed once daily dx e11.9, Disp: 100 each, Rfl: 5   Lancets 30G MISC, Once daily testing dx e11.9, Disp: 100 each, Rfl: 5   Multiple Vitamins-Minerals  (MULTIVITAMIN WITH MINERALS) tablet, Take 1 tablet by mouth daily., Disp: , Rfl:    nirmatrelvir/ritonavir (PAXLOVID) 20 x 150 MG & 10 x 100MG  TABS, Take 3 tablets by mouth 2 (two) times daily for 5 days. (Take nirmatrelvir 150 mg two tablets twice daily for 5 days and ritonavir 100 mg one tablet twice daily for 5 days) Patient GFR is 84, Disp: 30 tablet, Rfl: 0   amLODipine (NORVASC) 5 MG tablet, Take 1 tablet (5 mg total) by mouth daily., Disp: 90 tablet, Rfl: 3   rosuvastatin (CRESTOR) 10 MG tablet, Take 1 tablet (10 mg total) by mouth daily., Disp: 90 tablet, Rfl: 3 No current facility-administered medications for this visit.  Facility-Administered Medications Ordered in Other Visits:    sodium chloride flush (NS) 0.9 % injection 10 mL, 10 mL, Intravenous, PRN, Doreatha Massed, MD, 10 mL at 01/09/21 1430  Observations/Objective: Today's Vitals   09/24/23 0832  Weight: 130 lb (59 kg)  Height: 5\' 3"  (1.6 m)  PainSc: 0-No pain   Physical Exam Patient is well-developed, well-nourished in no acute distress.  Resting comfortably at home.  Head is normocephalic, atraumatic.  No labored breathing.  Speech is clear and coherent with logical content.  Patient is alert and oriented at baseline.   Assessment and Plan: Positive self-administered antigen test for COVID-19 -     nirmatrelvir/ritonavir; Take 3 tablets by mouth 2 (two) times daily for 5 days. (Take nirmatrelvir 150 mg two tablets twice daily for 5 days and ritonavir  100 mg one tablet twice daily for 5 days) Patient GFR is 84  Dispense: 30 tablet; Refill: 0  Essential hypertension -     amLODIPine Besylate; Take 1 tablet (5 mg total) by mouth daily.  Dispense: 90 tablet; Refill: 3  Mixed hyperlipidemia -     Rosuvastatin Calcium; Take 1 tablet (10 mg total) by mouth daily.  Dispense: 90 tablet; Refill: 3  Take medication as prescribed. Increase fluids and allow for plenty of rest. Recommend Tylenol as needed for pain,  fever, or general discomfort. Warm salt water gargles 3-4 times daily to help with throat pain or discomfort. Recommend using a humidifier at bedtime during sleep to help with cough and nasal congestion. Follow-up if your symptoms do not improve    Follow Up Instructions: No follow-ups on file.   I discussed the assessment and treatment plan with the patient. The patient was provided an opportunity to ask questions, and all were answered. The patient agreed with the plan and demonstrated an understanding of the instructions.   The patient was advised to call back or seek an in-person evaluation if the symptoms worsen or if the condition fails to improve as anticipated.  The above assessment and management plan was discussed with the patient. The patient verbalized understanding of and has agreed to the management plan.   Gilmore Laroche, FNP

## 2023-10-19 ENCOUNTER — Telehealth (INDEPENDENT_AMBULATORY_CARE_PROVIDER_SITE_OTHER): Payer: Self-pay | Admitting: Gastroenterology

## 2023-10-19 NOTE — Telephone Encounter (Signed)
Who is your primary care physician: Syliva Overman  Reasons for the colonoscopy: 3 year recall  Have you had a colonoscopy before?  yes  Do you have family history of colon cancer? Yes self  Previous colonoscopy with polyps removed? yes  Do you have a history colorectal cancer?   yes  Are you diabetic? If yes, Type 1 or Type 2?    no  Do you have a prosthetic or mechanical heart valve? no  Do you have a pacemaker/defibrillator?   no  Have you had endocarditis/atrial fibrillation? no  Have you had joint replacement within the last 12 months?  no  Do you tend to be constipated or have to use laxatives? no  Do you have any history of drugs or alchohol?  no  Do you use supplemental oxygen?  no  Have you had a stroke or heart attack within the last 6 months? no  Do you take weight loss medication?  no  For female patients: have you had a hysterectomy?  no                                     are you post menopausal?       yes                                            do you still have your menstrual cycle? no      Do you take any blood-thinning medications such as: (aspirin, warfarin, Plavix, Aggrenox)  yes  If yes we need the name, milligram, dosage and who is prescribing doctor aspirin 81 mg once per day  Current Outpatient Medications on File Prior to Visit  Medication Sig Dispense Refill   amLODipine (NORVASC) 5 MG tablet Take 1 tablet (5 mg total) by mouth daily. 90 tablet 3   aspirin EC 81 MG tablet Take 1 tablet (81 mg total) by mouth every evening. 30 tablet 11   Blood Glucose Monitoring Suppl (ACCU-CHEK GUIDE ME) w/Device KIT USE TO CHECK GLUCOSE ONCE DAILY 1 kit 0   Calcium Carbonate-Vitamin D 600-400 MG-UNIT tablet Take 1 tablet by mouth daily.      diphenhydrAMINE (SOMINEX) 25 MG tablet Take 25 mg by mouth at bedtime.     glucose blood test strip Use as instructed once daily dx e11.9 100 each 5   Lancets 30G MISC Once daily testing dx e11.9 100 each 5    Multiple Vitamins-Minerals (MULTIVITAMIN WITH MINERALS) tablet Take 1 tablet by mouth daily.     rosuvastatin (CRESTOR) 10 MG tablet Take 1 tablet (10 mg total) by mouth daily. 90 tablet 3   Current Facility-Administered Medications on File Prior to Visit  Medication Dose Route Frequency Provider Last Rate Last Admin   sodium chloride flush (NS) 0.9 % injection 10 mL  10 mL Intravenous PRN Doreatha Massed, MD   10 mL at 01/09/21 1430    No Known Allergies   [facility]  Primary Insurance Name: Elton Sin  Best number where you can be reached: [phone]

## 2023-10-19 NOTE — Telephone Encounter (Signed)
Ok to schedule.  Room 1/2  Thanks,  Vista Lawman, MD Gastroenterology and Hepatology [facility]

## 2023-10-20 MED ORDER — NA SULFATE-K SULFATE-MG SULF 17.5-3.13-1.6 GM/177ML PO SOLN: ORAL | 0 refills | Status: DC

## 2023-10-20 NOTE — Addendum Note (Signed)
Addended by: Marlowe Shores on: 10/20/2023 04:54 PM   Modules accepted: Orders

## 2023-10-21 NOTE — Telephone Encounter (Signed)
Questionnaire from recall, no referral needed  

## 2023-10-21 NOTE — Telephone Encounter (Signed)
Pt contacted and scheduled for 11/22/23. Prep sent to pharmacy. Pt requesting suprep. Instructions sent to pt via mail. Pa approved via [employer].

## 2023-11-04 ENCOUNTER — Inpatient Hospital Stay: Payer: Medicare HMO | Attending: Hematology

## 2023-11-04 ENCOUNTER — Ambulatory Visit (HOSPITAL_COMMUNITY)
Admission: RE | Admit: 2023-11-04 | Discharge: 2023-11-04 | Disposition: A | Discharge status: Home / Self Care | Payer: Medicare HMO | Source: Ambulatory Visit | Attending: Hematology | Admitting: Hematology

## 2023-11-04 DIAGNOSIS — I700 Atherosclerosis of aorta: Secondary | ICD-10-CM | POA: Diagnosis not present

## 2023-11-04 DIAGNOSIS — D259 Leiomyoma of uterus, unspecified: Secondary | ICD-10-CM | POA: Insufficient documentation

## 2023-11-04 DIAGNOSIS — C184 Malignant neoplasm of transverse colon: Secondary | ICD-10-CM | POA: Insufficient documentation

## 2023-11-04 DIAGNOSIS — K862 Cyst of pancreas: Secondary | ICD-10-CM | POA: Diagnosis not present

## 2023-11-04 DIAGNOSIS — C189 Malignant neoplasm of colon, unspecified: Secondary | ICD-10-CM | POA: Diagnosis not present

## 2023-11-04 DIAGNOSIS — K8020 Calculus of gallbladder without cholecystitis without obstruction: Secondary | ICD-10-CM | POA: Diagnosis not present

## 2023-11-04 DIAGNOSIS — Z79899 Other long term (current) drug therapy: Secondary | ICD-10-CM | POA: Insufficient documentation

## 2023-11-04 LAB — CBC WITH DIFFERENTIAL/PLATELET
Abs Immature Granulocytes: 0.02 10*3/uL (ref 0.00–0.07)
Basophils Absolute: 0.1 10*3/uL (ref 0.0–0.1)
Basophils Relative: 1 %
Eosinophils Absolute: 0.1 10*3/uL (ref 0.0–0.5)
Eosinophils Relative: 1 %
HCT: 40.3 % (ref 36.0–46.0)
Hemoglobin: 14.1 g/dL (ref 12.0–15.0)
Immature Granulocytes: 0 %
Lymphocytes Relative: 52 %
Lymphs Abs: 3.9 10*3/uL (ref 0.7–4.0)
MCH: 35.1 pg — ABNORMAL HIGH (ref 26.0–34.0)
MCHC: 35 g/dL (ref 30.0–36.0)
MCV: 100.2 fL — ABNORMAL HIGH (ref 80.0–100.0)
Monocytes Absolute: 0.4 10*3/uL (ref 0.1–1.0)
Monocytes Relative: 5 %
Neutro Abs: 3.1 10*3/uL (ref 1.7–7.7)
Neutrophils Relative %: 41 %
Platelets: 214 10*3/uL (ref 150–400)
RBC: 4.02 MIL/uL (ref 3.87–5.11)
RDW: 13.1 % (ref 11.5–15.5)
WBC: 7.5 10*3/uL (ref 4.0–10.5)
nRBC: 0 % (ref 0.0–0.2)

## 2023-11-04 LAB — COMPREHENSIVE METABOLIC PANEL
ALT: 24 U/L (ref 0–44)
AST: 25 U/L (ref 15–41)
Albumin: 4.8 g/dL (ref 3.5–5.0)
Alkaline Phosphatase: 71 U/L (ref 38–126)
Anion gap: 8 (ref 5–15)
BUN: 13 mg/dL (ref 8–23)
CO2: 27 mmol/L (ref 22–32)
Calcium: 9.7 mg/dL (ref 8.9–10.3)
Chloride: 103 mmol/L (ref 98–111)
Creatinine, Ser: 0.85 mg/dL (ref 0.44–1.00)
GFR, Estimated: >60 mL/min (ref >60)
Glucose, Bld: 96 mg/dL (ref 70–99)
Potassium: 3.7 mmol/L (ref 3.5–5.1)
Sodium: 138 mmol/L (ref 135–145)
Total Bilirubin: 1.2 mg/dL — ABNORMAL HIGH (ref <1.2)
Total Protein: 7.7 g/dL (ref 6.5–8.1)

## 2023-11-04 LAB — FOLATE: Folate: 15.7 ng/mL (ref >5.9)

## 2023-11-04 LAB — VITAMIN B12: Vitamin B-12: >7500 pg/mL — ABNORMAL HIGH (ref 180–914)

## 2023-11-04 MED ORDER — IOHEXOL 300 MG/ML  SOLN
100.0000 mL | Freq: Once | INTRAMUSCULAR | Status: AC | PRN
  Administered 2023-11-04: 100 mL via INTRAVENOUS

## 2023-11-04 MED ORDER — IOHEXOL 300 MG/ML  SOLN
25.0000 mL | Freq: Once | INTRAMUSCULAR | Status: AC | PRN
  Administered 2023-11-04: 30 mL via ORAL

## 2023-11-05 LAB — CEA: CEA: 8.1 ng/mL — ABNORMAL HIGH (ref 0.0–4.7)

## 2023-11-11 ENCOUNTER — Inpatient Hospital Stay: Payer: Medicare HMO | Admitting: Hematology

## 2023-11-15 ENCOUNTER — Ambulatory Visit (HOSPITAL_COMMUNITY)
Admission: RE | Admit: 2023-11-15 | Discharge: 2023-11-15 | Disposition: A | Discharge status: Home / Self Care | Payer: Medicare HMO | Source: Ambulatory Visit | Attending: Family Medicine | Admitting: Family Medicine

## 2023-11-15 ENCOUNTER — Ambulatory Visit: Payer: Medicare HMO

## 2023-11-15 DIAGNOSIS — Z1231 Encounter for screening mammogram for malignant neoplasm of breast: Secondary | ICD-10-CM | POA: Insufficient documentation

## 2023-11-15 DIAGNOSIS — E1149 Type 2 diabetes mellitus with other diabetic neurological complication: Secondary | ICD-10-CM

## 2023-11-15 LAB — HM DIABETES EYE EXAM

## 2023-11-15 NOTE — Progress Notes (Signed)
Andrea Simon arrived 11/15/2023 and has given verbal consent to obtain images and complete their overdue diabetic retinal screening.  The images have been sent to an ophthalmologist or optometrist for review and interpretation.  Results will be sent back to Kerri Perches, MD for review.  Patient has been informed they will be contacted when we receive the results via telephone or MyChart

## 2023-11-22 ENCOUNTER — Other Ambulatory Visit: Payer: Self-pay

## 2023-11-22 ENCOUNTER — Encounter (HOSPITAL_COMMUNITY): Payer: Self-pay

## 2023-11-22 ENCOUNTER — Ambulatory Visit (HOSPITAL_COMMUNITY)
Admission: RE | Admit: 2023-11-22 | Discharge: 2023-11-22 | Disposition: A | Discharge status: Home / Self Care | Payer: Medicare HMO | Attending: Gastroenterology | Admitting: Gastroenterology

## 2023-11-22 ENCOUNTER — Ambulatory Visit (HOSPITAL_COMMUNITY): Payer: Medicare HMO | Admitting: Anesthesiology

## 2023-11-22 ENCOUNTER — Encounter (HOSPITAL_COMMUNITY)
Admission: RE | Disposition: A | Discharge status: Home / Self Care | Payer: Self-pay | Source: Home / Self Care | Attending: Gastroenterology

## 2023-11-22 DIAGNOSIS — Z1211 Encounter for screening for malignant neoplasm of colon: Secondary | ICD-10-CM

## 2023-11-22 DIAGNOSIS — K644 Residual hemorrhoidal skin tags: Secondary | ICD-10-CM

## 2023-11-22 DIAGNOSIS — Z85038 Personal history of other malignant neoplasm of large intestine: Secondary | ICD-10-CM | POA: Diagnosis not present

## 2023-11-22 DIAGNOSIS — E785 Hyperlipidemia, unspecified: Secondary | ICD-10-CM | POA: Insufficient documentation

## 2023-11-22 DIAGNOSIS — F17210 Nicotine dependence, cigarettes, uncomplicated: Secondary | ICD-10-CM | POA: Diagnosis not present

## 2023-11-22 DIAGNOSIS — K648 Other hemorrhoids: Secondary | ICD-10-CM

## 2023-11-22 DIAGNOSIS — I10 Essential (primary) hypertension: Secondary | ICD-10-CM | POA: Diagnosis not present

## 2023-11-22 DIAGNOSIS — K5730 Diverticulosis of large intestine without perforation or abscess without bleeding: Secondary | ICD-10-CM

## 2023-11-22 DIAGNOSIS — R7303 Prediabetes: Secondary | ICD-10-CM | POA: Diagnosis not present

## 2023-11-22 DIAGNOSIS — Z860100 Personal history of colon polyps, unspecified: Secondary | ICD-10-CM | POA: Diagnosis not present

## 2023-11-22 DIAGNOSIS — Z9221 Personal history of antineoplastic chemotherapy: Secondary | ICD-10-CM | POA: Diagnosis not present

## 2023-11-22 DIAGNOSIS — Z980 Intestinal bypass and anastomosis status: Secondary | ICD-10-CM | POA: Insufficient documentation

## 2023-11-22 DIAGNOSIS — E1149 Type 2 diabetes mellitus with other diabetic neurological complication: Secondary | ICD-10-CM

## 2023-11-22 DIAGNOSIS — Z7982 Long term (current) use of aspirin: Secondary | ICD-10-CM | POA: Diagnosis not present

## 2023-11-22 DIAGNOSIS — Z79899 Other long term (current) drug therapy: Secondary | ICD-10-CM | POA: Diagnosis not present

## 2023-11-22 DIAGNOSIS — E119 Type 2 diabetes mellitus without complications: Secondary | ICD-10-CM | POA: Diagnosis not present

## 2023-11-22 HISTORY — PX: COLONOSCOPY WITH PROPOFOL: SHX5780

## 2023-11-22 LAB — HM COLONOSCOPY

## 2023-11-22 SURGERY — COLONOSCOPY WITH PROPOFOL
Anesthesia: General

## 2023-11-22 MED ORDER — LACTATED RINGERS IV SOLN: INTRAVENOUS | Status: DC | PRN

## 2023-11-22 MED ORDER — PROPOFOL 10 MG/ML IV BOLUS
INTRAVENOUS | Status: DC | PRN
Start: 2023-11-22 — End: 2023-11-22
  Administered 2023-11-22: 30 mg via INTRAVENOUS
  Administered 2023-11-22: 100 mg via INTRAVENOUS

## 2023-11-22 MED ORDER — LIDOCAINE HCL (CARDIAC) PF 100 MG/5ML IV SOSY
PREFILLED_SYRINGE | INTRAVENOUS | Status: DC | PRN
Start: 2023-11-22 — End: 2023-11-22
  Administered 2023-11-22: 50 mg via INTRATRACHEAL

## 2023-11-22 MED ORDER — PROPOFOL 500 MG/50ML IV EMUL
INTRAVENOUS | Status: DC | PRN
  Administered 2023-11-22: 150 ug/kg/min via INTRAVENOUS

## 2023-11-22 NOTE — Discharge Instructions (Signed)

## 2023-11-22 NOTE — Anesthesia Preprocedure Evaluation (Signed)
Anesthesia Evaluation  Patient identified by MRN, date of birth, ID band Patient awake    Reviewed: Allergy & Precautions, H&P , NPO status , Patient's Chart, lab work & pertinent test results, reviewed documented beta blocker date and time   Airway Mallampati: II  TM Distance: >3 FB Neck ROM: full    Dental no notable dental hx. (+) Dental Advisory Given, Teeth Intact   Pulmonary neg pulmonary ROS, Current Smoker   Pulmonary exam normal breath sounds clear to auscultation       Cardiovascular Exercise Tolerance: Good hypertension, negative cardio ROS Normal cardiovascular exam Rhythm:regular Rate:Normal     Neuro/Psych  PSYCHIATRIC DISORDERS      negative neurological ROS     GI/Hepatic negative GI ROS, Neg liver ROS,,,  Endo/Other  diabetes, Type 2    Renal/GU negative Renal ROS  negative genitourinary   Musculoskeletal   Abdominal   Peds  Hematology negative hematology ROS (+)   Anesthesia Other Findings   Reproductive/Obstetrics negative OB ROS                             Anesthesia Physical Anesthesia Plan  ASA: 3  Anesthesia Plan: General and General LMA   Post-op Pain Management: Minimal or no pain anticipated   Induction: Intravenous  PONV Risk Score and Plan: Propofol infusion  Airway Management Planned: Nasal Cannula and Natural Airway  Additional Equipment: None  Intra-op Plan:   Post-operative Plan:   Informed Consent: I have reviewed the patients History and Physical, chart, labs and discussed the procedure including the risks, benefits and alternatives for the proposed anesthesia with the patient or authorized representative who has indicated his/her understanding and acceptance.     Dental Advisory Given  Plan Discussed with: CRNA  Anesthesia Plan Comments:         Anesthesia Quick Evaluation

## 2023-11-22 NOTE — Transfer of Care (Signed)
Immediate Anesthesia Transfer of Care Note  Patient: Andrea Simon  Procedure(s) Performed: COLONOSCOPY WITH PROPOFOL  Patient Location: Endoscopy Unit  Anesthesia Type:General  Level of Consciousness: awake  Airway & Oxygen Therapy: Patient Spontanous Breathing  Post-op Assessment: Report given to RN  Post vital signs: Reviewed and stable  Last Vitals:  Vitals Value Taken Time  BP    Temp    Pulse    Resp    SpO2      Last Pain:  Vitals:   11/22/23 0840  TempSrc: Oral  PainSc: 0-No pain      Patients Stated Pain Goal: 8 (11/22/23 0840)  Complications: No notable events documented.

## 2023-11-22 NOTE — Anesthesia Postprocedure Evaluation (Signed)
Anesthesia Post Note  Patient: Andrea Simon  Procedure(s) Performed: COLONOSCOPY WITH PROPOFOL  Patient location during evaluation: Endoscopy Anesthesia Type: General Level of consciousness: awake and alert Pain management: pain level controlled Vital Signs Assessment: post-procedure vital signs reviewed and stable Respiratory status: spontaneous breathing Cardiovascular status: blood pressure returned to baseline and stable Postop Assessment: no apparent nausea or vomiting Anesthetic complications: no   No notable events documented.   Last Vitals:  Vitals:   11/22/23 0840  BP: 129/81  Pulse: 99  Resp: 20  Temp: 36.8 C  SpO2: 99%    Last Pain:  Vitals:   11/22/23 0840  TempSrc: Oral  PainSc: 0-No pain                 Kathaleen Dudziak

## 2023-11-22 NOTE — H&P (Signed)
Primary Care Physician:  Kerri Perches, MD Primary Gastroenterologist:  Dr. Tasia Catchings  Pre-Procedure History & Physical: HPI:  [name] who underwent colonoscopy in December 2019 for positive Cologuard and was found to have adenocarcinoma of ascending colon pT4b, pN1a.  She received 12 cycles of chemotherapy.  She is doing well.  She is here for surveillance colonoscopy.  She denies abdominal pain change in bowel habits or rectal bleeding.  Her appetite is good and weight stable.   2021 - The examined portion of the ileum was normal. - Patent end- to- side ileo- colonic anastomosis, characterized by healthy appearing mucosa. - One small polyp at the splenic flexure, removed with a cold snare. Resected and retrieved. - External hemorrhoids. - Anal papilla( e) were hypertrophied. Past Medical History:  Diagnosis Date   Cancer (HCC) 11/2018   Phreesia 09/16/2020   Hyperlipidemia    Hypertension    Insomnia due to anxiety and fear 01/27/2019   Multinodular goiter    Pre-diabetes    Prediabetes     Past Surgical History:  Procedure Laterality Date   BIOPSY  12/07/2018   Procedure: BIOPSY;  Surgeon: Malissa Hippo, MD;  Location: [facility];  Service: Endoscopy;;  sigmoid colon   COLON RESECTION N/A 01/09/2019   Procedure: LAPAROSCOPIC RIGHT HEMICOLECTOMY;  Surgeon: Lucretia Roers, MD;  Location: [facility];  Service: General;  Laterality: N/A;   COLON SURGERY N/A    [employer]reesia 09/16/2020   COLONOSCOPY N/A 12/07/2018   Procedure: COLONOSCOPY;  Surgeon: Malissa Hippo, MD;  [facility]: [facility];  Service: Endoscopy;  Laterality: N/A;  12:45   COLONOSCOPY WITH PROPOFOL N/A 10/16/2020   Procedure: COLONOSCOPY WITH PROPOFOL;  Surgeon: Malissa Hippo, MD;  [facility]: [facility];  Service: Endoscopy;  Laterality: N/A;  955   DILATION AND CURETTAGE OF UTERUS  2006   FNA thyroid  2011 and 2019   benign   HYSTEROSCOPY WITH D & C N/A 03/04/2022    Procedure: DILATATION AND CURETTAGE /HYSTEROSCOPY;  Surgeon: Lazaro Arms, MD;  Location: [facility];  Service: Gynecology;  Laterality: N/A;   POLYPECTOMY  12/07/2018   Procedure: POLYPECTOMY;  Surgeon: Malissa Hippo, MD;  [facility]: [facility];  Service: Endoscopy;;  splenic flexure (CS x1)   POLYPECTOMY  10/16/2020   Procedure: POLYPECTOMY;  Surgeon: Malissa Hippo, MD;  [facility]: [facility];  Service: Endoscopy;;   POLYPECTOMY N/A 03/04/2022   Procedure: Removal of Endometrial Polyp;  Surgeon: Lazaro Arms, MD;  Location: [facility];  Service: Gynecology;  Laterality: N/A;   PORTACATH PLACEMENT Left 02/15/2019   Procedure: INSERTION PORT-A-CATH;  Surgeon: Lucretia Roers, MD;  Location: [facility];  Service: General;  Laterality: Left;   TUBAL LIGATION  1992    Prior to Admission medications   Medication Sig Start Date End Date Taking? Authorizing Provider  amLODipine (NORVASC) 5 MG tablet Take 1 tablet (5 mg total) by mouth daily. 09/24/23  Yes Gilmore Laroche, FNP  aspirin EC 81 MG tablet Take 1 tablet (81 mg total) by mouth every evening. 10/17/20  Yes Rehman, Joline Maxcy, MD  Calcium Carbonate-Vitamin D 600-400 MG-UNIT tablet Take 1 tablet by mouth daily.    Yes [provider]  diphenhydrAMINE (SOMINEX) 25 MG tablet Take 25 mg by mouth at bedtime.   Yes [provider]  Multiple Vitamins-Minerals (MULTIVITAMIN WITH MINERALS) tablet Take 1 tablet by mouth daily.   Yes [provider]  rosuvastatin (  CRESTOR) 10 MG tablet Take 1 tablet (10 mg total) by mouth daily. 09/24/23  Yes Gilmore Laroche, FNP  Blood Glucose Monitoring Suppl (ACCU-CHEK GUIDE ME) w/Device KIT USE TO CHECK GLUCOSE ONCE DAILY 09/21/23   Kerri Perches, MD  glucose blood test strip Use as instructed once daily dx e11.9 09/21/23   Kerri Perches, MD  Lancets 30G MISC Once daily testing dx e11.9 09/21/23   Kerri Perches, MD  Na Sulfate-K Sulfate-Mg Sulf 17.5-3.13-1.6 GM/177ML SOLN  Use as directed 10/20/23   Franky Macho, MD    Allergies as of 10/20/2023   (No Known Allergies)    Family History  Problem Relation Age of Onset   Hypertension Mother        AAA   Coronary artery disease Mother    Hyperlipidemia Mother    Cancer Mother        lung   Hypertension Father    Cancer Brother     Social History   Socioeconomic History   Marital status: Married    Spouse name: Not on file   Number of children: 1   Years of education: Not on file   Highest education level: Not on file  Occupational History   Occupation: Advertising copywriter - [employer]   Tobacco Use   Smoking status: Every Day    Current packs/day: 0.75    Average packs/day: 0.8 packs/day for 45.0 years (33.8 ttl pk-yrs)    Types: Cigarettes   Smokeless tobacco: Never  Vaping Use   Vaping status: Never Used  Substance and Sexual Activity   Alcohol use: Yes    Alcohol/week: 0.0 standard drinks of alcohol    Comment: occasionally   Drug use: No   Sexual activity: Yes    Birth control/protection: None, Surgical  Other Topics Concern   Not on file  Social History Narrative   Not on file   Social Determinants of Health   Financial Resource Strain: Low Risk  (02/03/2022)   Overall Financial Resource Strain (CARDIA)    Difficulty of Paying Living Expenses: Not hard at all  Food Insecurity: No Food Insecurity (02/03/2022)   Hunger Vital Sign    Worried About Running Out of Food in the Last Year: Never true    Ran Out of Food in the Last Year: Never true  Transportation Needs: No Transportation Needs (02/03/2022)   PRAPARE - Administrator, Civil Service (Medical): No    Lack of Transportation (Non-Medical): No  Physical Activity: Insufficiently Active (02/03/2022)   Exercise Vital Sign    Days of Exercise per Week: 3 days    Minutes of Exercise per Session: 30 min  Stress: No Stress Concern Present (02/03/2022)   [employer] of Occupational Health - Occupational Stress  Questionnaire    Feeling of Stress : Only a little  Social Connections: Moderately Isolated (02/03/2022)   Social Connection and Isolation Panel [NHANES]    Frequency of Communication with Friends and Family: More than three times a week    Frequency of Social Gatherings with Friends and Family: Three times a week    Attends Religious Services: Never    Active Member of Clubs or Organizations: No    Attends Banker Meetings: Never    Marital Status: Married  Catering manager Violence: Not At Risk (02/03/2022)   Humiliation, Afraid, Rape, and Kick questionnaire    Fear of Current or Ex-Partner: No    Emotionally Abused: No    Physically  Abused: No    Sexually Abused: No    Review of Systems: See HPI, otherwise negative ROS  Physical Exam: Vital signs in last 24 hours: Temp:  [98.2 F (36.8 C)] 98.2 F (36.8 C) (12/02 0840) Pulse Rate:  [99] 99 (12/02 0840) Resp:  [20] 20 (12/02 0840) BP: (129)/(81) 129/81 (12/02 0840) SpO2:  [99 %] 99 % (12/02 0840) Weight:  [58.5 kg] 58.5 kg (12/02 0840)   General:   Alert,  Well-developed, well-nourished, pleasant and cooperative in NAD Head:  Normocephalic and atraumatic. Eyes:  Sclera clear, no icterus.   Conjunctiva pink. Ears:  Normal auditory acuity. Nose:  No deformity, discharge,  or lesions. Msk:  Symmetrical without gross deformities. Normal posture. Extremities:  Without clubbing or edema. Neurologic:  Alert and  oriented x4;  grossly normal neurologically. Skin:  Intact without significant lesions or rashes. Psych:  Alert and cooperative. Normal mood and affect.  Impression/Plan: Andrea Simon is here for a colonoscopy to be performed for history of colon cancer; surveillance   The risks of the procedure including infection, bleed, or perforation as well as benefits, limitations, alternatives and imponderables have been reviewed with the patient. Questions have been answered. All parties agreeable.

## 2023-11-22 NOTE — Op Note (Signed)
[facility] Patient Name: Andrea Simon Procedure Date: 11/22/2023 9:40 AM MRN: [id number] Date of Birth: 1956-06-29 Attending MD: Sanjuan Dame , MD, [id number] CSN: [id number] Age: 67 Admit Type: Outpatient Procedure:                Colonoscopy Indications:              High risk colon cancer surveillance: Personal                            history of colon cancer Providers:                Sanjuan Dame, MD, Nena Polio, RN, Lennice Sites                            Technician, Technician Referring MD:              Medicines:                Monitored Anesthesia Care Complications:            No immediate complications. Estimated Blood Loss:     Estimated blood loss: none. Procedure:                Pre-Anesthesia Assessment:                           - Prior to the procedure, a History and Physical                            was performed, and patient medications and                            allergies were reviewed. [name] tolerance of                            previous anesthesia was also reviewed. The risks                            and benefits of the procedure and the sedation                            options and risks were discussed with the patient.                            All questions were answered, and informed consent                            was obtained. Prior Anticoagulants: The patient has                            taken no anticoagulant or antiplatelet agents                            except for aspirin. ASA Grade Assessment: II - A  patient with mild systemic disease. After reviewing                            the risks and benefits, the patient was deemed in                            satisfactory condition to undergo the procedure.                           After obtaining informed consent, the colonoscope                            was passed under direct vision. Throughout the                             procedure, the patient's blood pressure, pulse, and                            oxygen saturations were monitored continuously. The                            PCF-HQ190L ([id number]) scope was introduced through                            the anus and advanced to the the terminal ileum.                            The colonoscopy was performed without difficulty.                            The patient tolerated the procedure well. The                            quality of the bowel preparation was evaluated                            using the BBPS [facility] Bowel Preparation Scale)                            with scores of: Right Colon = 3, Transverse Colon =                            3 and Left Colon = 3 (entire mucosa seen well with                            no residual staining, small fragments of stool or                            opaque liquid). The total BBPS score equals 9. The                            terminal ileum, ileocecal valve, appendiceal  orifice, and rectum were photographed. Scope In: 9:55:00 AM Scope Out: 10:07:02 AM Scope Withdrawal Time: 0 hours 7 minutes 35 seconds  Total Procedure Duration: 0 hours 12 minutes 2 seconds  Findings:      The perianal and digital rectal examinations were normal.      There was evidence of a prior end-to-side colo-colonic anastomosis at       the hepatic flexure. This was patent and was characterized by healthy       appearing mucosa. The anastomosis was traversed.      The terminal ileum appeared normal.      A few diverticula were found in the left colon.      Non-bleeding external and internal hemorrhoids were found during       retroflexion. The hemorrhoids were small. Impression:               - Patent end-to-side colo-colonic anastomosis,                            characterized by healthy appearing mucosa.                           - The examined portion of the ileum was normal.                            - Diverticulosis in the left colon.                           - Non-bleeding external and internal hemorrhoids.                           - No specimens collected. Moderate Sedation:      Per Anesthesia Care Recommendation:           - Patient has a contact number available for                            emergencies. The signs and symptoms of potential                            delayed complications were discussed with the                            patient. Return to normal activities tomorrow.                            Written discharge instructions were provided to the                            patient.                           - Resume previous diet.                           - Continue present medications.                           - Repeat  colonoscopy in 5 years for surveillance.                           - Return to primary care physician as previously                            scheduled. Procedure Code(s):        --- Professional ---                           U2725, Colorectal cancer screening; colonoscopy on                            individual at high risk Diagnosis Code(s):        --- Professional ---                           [phone], Personal history of other malignant                            neoplasm of large intestine                           Z98.0, Intestinal bypass and anastomosis status                           K64.8, Other hemorrhoids                           K57.30, Diverticulosis of large intestine without                            perforation or abscess without bleeding CPT copyright 2022 [employer]. All rights reserved. The codes documented in this report are preliminary and upon coder review may  be revised to meet current compliance requirements. Sanjuan Dame, MD Sanjuan Dame, MD 11/22/2023 10:11:58 AM This report has been signed electronically. Number of Addenda: 0

## 2023-11-23 ENCOUNTER — Ambulatory Visit (INDEPENDENT_AMBULATORY_CARE_PROVIDER_SITE_OTHER): Payer: Medicare HMO | Admitting: Family Medicine

## 2023-11-23 ENCOUNTER — Encounter: Payer: Self-pay | Admitting: Family Medicine

## 2023-11-23 ENCOUNTER — Encounter (INDEPENDENT_AMBULATORY_CARE_PROVIDER_SITE_OTHER): Payer: Self-pay | Admitting: *Deleted

## 2023-11-23 VITALS — BP 128/74 | HR 77 | Ht 63.0 in | Wt 131.1 lb

## 2023-11-23 DIAGNOSIS — E782 Mixed hyperlipidemia: Secondary | ICD-10-CM

## 2023-11-23 DIAGNOSIS — R7303 Prediabetes: Secondary | ICD-10-CM

## 2023-11-23 DIAGNOSIS — Z23 Encounter for immunization: Secondary | ICD-10-CM | POA: Diagnosis not present

## 2023-11-23 DIAGNOSIS — Z0000 Encounter for general adult medical examination without abnormal findings: Secondary | ICD-10-CM

## 2023-11-23 DIAGNOSIS — I10 Essential (primary) hypertension: Secondary | ICD-10-CM

## 2023-11-23 DIAGNOSIS — F17200 Nicotine dependence, unspecified, uncomplicated: Secondary | ICD-10-CM

## 2023-11-23 NOTE — Patient Instructions (Addendum)
F/u in 5 months, call if you need me sooner  HBA1C in next 1 to 2 weeks   Flu vaccine today  Thanks for choosing [facility], we consider it a privelige to serve you.

## 2023-11-23 NOTE — Progress Notes (Signed)
[facility] [address], [place] [address]    Clinic Day:  11/25/2023  Referring physician: Kerri Perches, MD  Patient Care Team: Kerri Perches, MD as PCP - General (Family Medicine)   ASSESSMENT & PLAN:   Assessment: 1.  Stage III (PT4BPN1A) transverse colon adenocarcinoma: -Right colectomy on 01/09/2019, 1/24 lymph nodes positive, no tumor deposits, no perineural invasion, grade 2, tumor extending through serosa into adherent omentum, margins negative. -PET scan on 01/31/2019 shows focal hypermetabolic activity identified in the ileocecal anastomosis with SUV 9. -12 cycles of FOLFOX from 03/01/2019 through 08/22/2019. - Colonoscopy on 10/16/2020: Did not show any major abnormalities.   2.  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  MSI was high by PCR.  BRAF testing was negative. -MLH1 hyper methylation was present.  Thought to be sporadic cancer.   3.  Smoking history: -40+ pack year smoker. -CT low-dose on 12/05/2019 was lung RADS 2 with emphysema. -CT chest on 12/25/2020 was lung RADS 2.    Plan: 1.  Stage III (PT4BPN1A) transverse colon adenocarcinoma: - Denies any change in bowel habits.  No bleeding per rectum. - Labs from 11/04/2023: Normal LFTs.  CBC grossly normal. - She had a colonoscopy on 11/22/2023.  Anastomotic site is within normal limits.  No significant findings. - CTAP on 11/04/2023: No evidence of metastatic disease.  Stable 2 to 3 mm cystic lesion in the pancreatic body/tail junction likely IPMN.  Other benign findings discussed.  CEA was elevated at 8.1.  Likely smoking-related although slightly trending up. - Recommend follow-up in 6 months with repeat labs including CEA.  Will also repeat CTAP with contrast.   2.  Peripheral neuropathy: - Numbness in the feet is stable.  Not on any medications.   3.  Mild macrocytosis: - As MCV was slightly high, we checked B12 and folic acid levels.  Folic acid normal and  B12 level was high at 7500.  She reportedly had COVID infection in October.  This is most likely reactive as she had normal levels previously.   4.  Smoking history: - Current active smoker.  CT low-dose scan on 12/28/2022, lung RADS 3. - We will repeat CT scan in January.    Orders Placed This Encounter  Procedures   CT ABDOMEN PELVIS W CONTRAST    Standing Status:   Future    Standing Expiration Date:   11/24/2024    Order Specific Question:   If indicated for the ordered procedure, I authorize the administration of contrast media per Radiology protocol    Answer:   Yes    Order Specific Question:   Does the patient have a contrast media/X-ray dye allergy?    Answer:   No    Order Specific Question:   Preferred imaging location?    Answer:   [facility]    Order Specific Question:   Release to patient    Answer:   Immediate [1]    Order Specific Question:   If indicated for the ordered procedure, I authorize the administration of oral contrast media per Radiology protocol    Answer:   Yes   CT CHEST LUNG CA SCREEN LOW DOSE W/O CM    Standing Status:   Future    Standing Expiration Date:   11/24/2024    Order Specific Question:   Preferred Imaging Location?    Answer:   [facility]      [place]wood R  Teague,acting as a Neurosurgeon for Doreatha Massed, MD.,have documented all relevant documentation on the behalf of Doreatha Massed, MD,as directed by  Doreatha Massed, MD while in the presence of Doreatha Massed, MD.  I, Doreatha Massed MD, have reviewed the above documentation for accuracy and completeness, and I agree with the above.    Doreatha Massed, MD   12/5/20245:43 PM  CHIEF COMPLAINT:   Diagnosis: stage III transverse colon adenocarcinoma    Cancer Staging  No matching staging information was found for the patient.    Prior Therapy: 1. Laparoscopic right colectomy on 01/09/2019. 2. FOLFOX x 12 cycles from 03/01/2019 to  08/22/2019.  Current Therapy:  surveillance    HISTORY OF PRESENT ILLNESS:   Oncology History  Colon cancer (HCC)  01/09/2019 Initial Diagnosis   Colon cancer (HCC)   03/01/2019 - 08/24/2019 Chemotherapy   Patient is on Treatment Plan : COLORECTAL FOLFOX q14d x 6 months        INTERVAL HISTORY:   Andrea Simon is a 67 y.o. female presenting to clinic today for follow up of stage III transverse colon adenocarcinoma. She was last seen by me on 05/11/23.  Since her last visit, she underwent a CT A/P on 11/04/23 that found: no evidence of metastatic disease in the abdomen or pelvis; stable 2-3 mm cystic lesion in the pancreatic body/tail junction likely reflecting a side branch IPMN; cholelithiasis without findings of acute cholecystitis; and uterine fibroids. Of note, she had a colonoscopy on 11/22/23 with Dr. Tasia Catchings.   Today, she states that she is doing well overall. Her appetite level is at 100%. Her energy level is at 70%.  She reports normal BM's. She denies any pain in the abdomen, BRBPR, melena, or hematuria. She notes stable peripheral neuropathy in the feet. She had elevated Vitamin B12 levels at her last PCP visit, likely due to a Covid infection she had in October 2024.   PAST MEDICAL HISTORY:   Past Medical History: Past Medical History:  Diagnosis Date   Cancer (HCC) 11/2018   Phreesia 09/16/2020   Hyperlipidemia    Hypertension    Insomnia due to anxiety and fear 01/27/2019   Multinodular goiter    Pre-diabetes    Prediabetes     Surgical History: Past Surgical History:  Procedure Laterality Date   BIOPSY  12/07/2018   Procedure: BIOPSY;  Surgeon: Malissa Hippo, MD;  [facility];  Service: Endoscopy;;  sigmoid colon   COLON RESECTION N/A 01/09/2019   Procedure: LAPAROSCOPIC RIGHT HEMICOLECTOMY;  Surgeon: Lucretia Roers, MD;  Location: [facility];  Service: General;  Laterality: N/A;   COLON SURGERY N/A    Phreesia 09/16/2020   COLONOSCOPY N/A 12/07/2018    Procedure: COLONOSCOPY;  Surgeon: Malissa Hippo, MD;  Location: [facility];  Service: Endoscopy;  Laterality: N/A;  12:45   COLONOSCOPY WITH PROPOFOL N/A 10/16/2020   Procedure: COLONOSCOPY WITH PROPOFOL;  Surgeon: Malissa Hippo, MD;  Location: [facility];  Service: Endoscopy;  Laterality: N/A;  955   DILATION AND CURETTAGE OF UTERUS  2006   FNA thyroid  2011 and 2019   benign   HYSTEROSCOPY WITH D & C N/A 03/04/2022   Procedure: DILATATION AND CURETTAGE /HYSTEROSCOPY;  Surgeon: Lazaro Arms, MD;  Location: [facility];  Service: Gynecology;  Laterality: N/A;   POLYPECTOMY  12/07/2018   Procedure: POLYPECTOMY;  Surgeon: Malissa Hippo, MD;  [facility]: [facility];  Service: Endoscopy;;  splenic flexure (CS x1)  POLYPECTOMY  10/16/2020   Procedure: POLYPECTOMY;  Surgeon: Malissa Hippo, MD;  [facility]: [facility];  Service: Endoscopy;;   POLYPECTOMY N/A 03/04/2022   Procedure: Removal of Endometrial Polyp;  Surgeon: Lazaro Arms, MD;  Location: [facility];  Service: Gynecology;  Laterality: N/A;   PORTACATH PLACEMENT Left 02/15/2019   Procedure: INSERTION PORT-A-CATH;  Surgeon: Lucretia Roers, MD;  Location: [facility];  Service: General;  Laterality: Left;   TUBAL LIGATION  1992    Social History: Social History   Socioeconomic History   Marital status: Married    Spouse name: Not on file   Number of children: 1   Years of education: Not on file   Highest education level: Not on file  Occupational History   Occupation: Advertising copywriter - [employer]   Tobacco Use   Smoking status: Every Day    Current packs/day: 0.75    Average packs/day: 0.8 packs/day for 45.0 years (33.8 ttl pk-yrs)    Types: Cigarettes   Smokeless tobacco: Never  Vaping Use   Vaping status: Never Used  Substance and Sexual Activity   Alcohol use: Yes    Alcohol/week: 0.0 standard drinks of alcohol    Comment: occasionally   Drug use: No   Sexual activity: Yes    Birth control/protection:  None, Surgical  Other Topics Concern   Not on file  Social History Narrative   Not on file   Social Determinants of Health   Financial Resource Strain: Low Risk  (02/03/2022)   Overall Financial Resource Strain (CARDIA)    Difficulty of Paying Living Expenses: Not hard at all  Food Insecurity: No Food Insecurity (02/03/2022)   Hunger Vital Sign    Worried About Running Out of Food in the Last Year: Never true    Ran Out of Food in the Last Year: Never true  Transportation Needs: No Transportation Needs (02/03/2022)   PRAPARE - Administrator, Civil Service (Medical): No    Lack of Transportation (Non-Medical): No  Physical Activity: Insufficiently Active (02/03/2022)   Exercise Vital Sign    Days of Exercise per Week: 3 days    Minutes of Exercise per Session: 30 min  Stress: No Stress Concern Present (02/03/2022)   [employer] of Occupational Health - Occupational Stress Questionnaire    Feeling of Stress : Only a little  Social Connections: Moderately Isolated (02/03/2022)   Social Connection and Isolation Panel [NHANES]    Frequency of Communication with Friends and Family: More than three times a week    Frequency of Social Gatherings with Friends and Family: Three times a week    Attends Religious Services: Never    Active Member of Clubs or Organizations: No    Attends Banker Meetings: Never    Marital Status: Married  Catering manager Violence: Not At Risk (02/03/2022)   Humiliation, Afraid, Rape, and Kick questionnaire    Fear of Current or Ex-Partner: No    Emotionally Abused: No    Physically Abused: No    Sexually Abused: No    Family History: Family History  Problem Relation Age of Onset   Hypertension Mother        AAA   Coronary artery disease Mother    Hyperlipidemia Mother    Cancer Mother        lung   Hypertension Father    Cancer Brother     Current Medications:  Current Outpatient Medications:    amLODipine  (  NORVASC) 5 MG tablet, Take 1 tablet (5 mg total) by mouth daily., Disp: 90 tablet, Rfl: 3   aspirin EC 81 MG tablet, Take 1 tablet (81 mg total) by mouth every evening., Disp: 30 tablet, Rfl: 11   Blood Glucose Monitoring Suppl (ACCU-CHEK GUIDE ME) w/Device KIT, USE TO CHECK GLUCOSE ONCE DAILY, Disp: 1 kit, Rfl: 0   Calcium Carbonate-Vitamin D 600-400 MG-UNIT tablet, Take 1 tablet by mouth daily. , Disp: , Rfl:    diphenhydrAMINE (SOMINEX) 25 MG tablet, Take 25 mg by mouth at bedtime., Disp: , Rfl:    glucose blood test strip, Use as instructed once daily dx e11.9, Disp: 100 each, Rfl: 5   Lancets 30G MISC, Once daily testing dx e11.9, Disp: 100 each, Rfl: 5   Multiple Vitamins-Minerals (MULTIVITAMIN WITH MINERALS) tablet, Take 1 tablet by mouth daily., Disp: , Rfl:    Na Sulfate-K Sulfate-Mg Sulf 17.5-3.13-1.6 GM/177ML SOLN, Use as directed, Disp: 354 mL, Rfl: 0   rosuvastatin (CRESTOR) 10 MG tablet, Take 1 tablet (10 mg total) by mouth daily., Disp: 90 tablet, Rfl: 3 No current facility-administered medications for this visit.  Facility-Administered Medications Ordered in Other Visits:    sodium chloride flush (NS) 0.9 % injection 10 mL, 10 mL, Intravenous, PRN, Doreatha Massed, MD, 10 mL at 01/09/21 1430   Allergies: No Known Allergies  REVIEW OF SYSTEMS:   Review of Systems  Constitutional:  Negative for chills, fatigue and fever.  HENT:   Negative for lump/mass, mouth sores, nosebleeds, sore throat and trouble swallowing.   Eyes:  Negative for eye problems.  Respiratory:  Negative for cough and shortness of breath.   Cardiovascular:  Negative for chest pain, leg swelling and palpitations.  Gastrointestinal:  Negative for abdominal pain, constipation, diarrhea, nausea and vomiting.  Genitourinary:  Negative for bladder incontinence, difficulty urinating, dysuria, frequency, hematuria and nocturia.   Musculoskeletal:  Negative for arthralgias, back pain, flank pain, myalgias and  neck pain.  Skin:  Negative for itching and rash.  Neurological:  Positive for numbness (in feet). Negative for dizziness and headaches.  Hematological:  Does not bruise/bleed easily.  Psychiatric/Behavioral:  Negative for depression, sleep disturbance and suicidal ideas. The patient is not nervous/anxious.   All other systems reviewed and are negative.    VITALS:   Blood pressure 116/73, pulse 72, temperature (!) 96.5 F (35.8 C), temperature source Tympanic, resp. rate 18, weight 130 lb 9.6 oz (59.2 kg), SpO2 100%.  Wt Readings from Last 3 Encounters:  11/25/23 130 lb 9.6 oz (59.2 kg)  11/23/23 131 lb 1.9 oz (59.5 kg)  11/22/23 129 lb (58.5 kg)    Body mass index is 23.13 kg/m.  Performance status (ECOG): 1 - Symptomatic but completely ambulatory  PHYSICAL EXAM:   Physical Exam Vitals and nursing note reviewed. Exam conducted with a chaperone present.  Constitutional:      Appearance: Normal appearance.  Cardiovascular:     Rate and Rhythm: Normal rate and regular rhythm.     Pulses: Normal pulses.     Heart sounds: Normal heart sounds.  Pulmonary:     Effort: Pulmonary effort is normal.     Breath sounds: Normal breath sounds.  Abdominal:     Palpations: Abdomen is soft. There is no hepatomegaly, splenomegaly or mass.     Tenderness: There is no abdominal tenderness.  Musculoskeletal:     Right lower leg: No edema.     Left lower leg: No edema.  Lymphadenopathy:  Cervical: No cervical adenopathy.     Right cervical: No superficial, deep or posterior cervical adenopathy.    Left cervical: No superficial, deep or posterior cervical adenopathy.     Upper Body:     Right upper body: No supraclavicular or axillary adenopathy.     Left upper body: No supraclavicular or axillary adenopathy.  Neurological:     General: No focal deficit present.     Mental Status: She is alert and oriented to person, place, and time.  Psychiatric:        Mood and Affect: Mood normal.         Behavior: Behavior normal.     LABS:      Latest Ref Rng & Units 11/04/2023    2:17 PM 05/06/2023   10:52 AM 11/03/2022    8:03 AM  CBC  WBC 4.0 - 10.5 K/uL 7.5  7.2  6.4   Hemoglobin 12.0 - 15.0 g/dL 16.1  09.6  04.5   Hematocrit 36.0 - 46.0 % 40.3  40.2  42.7   Platelets 150 - 400 K/uL 214  232  209       Latest Ref Rng & Units 11/04/2023    2:17 PM 05/06/2023   10:52 AM 11/17/2022    1:12 PM  CMP  Glucose 70 - 99 mg/dL 96  409  811   BUN 8 - 23 mg/dL 13  14  12    Creatinine 0.44 - 1.00 mg/dL 9.14  7.82  9.56   Sodium 135 - 145 mmol/L 138  137  140   Potassium 3.5 - 5.1 mmol/L 3.7  3.6  4.1   Chloride 98 - 111 mmol/L 103  103  102   CO2 22 - 32 mmol/L 27  26  22    Calcium 8.9 - 10.3 mg/dL 9.7  9.7  21.3   Total Protein 6.5 - 8.1 g/dL 7.7  7.5  7.3   Total Bilirubin <1.2 mg/dL 1.2  1.2  1.1   Alkaline Phos 38 - 126 U/L 71  68  79   AST 15 - 41 U/L 25  31  28    ALT 0 - 44 U/L 24  26  25       Lab Results  Component Value Date   CEA1 8.1 (H) 11/04/2023   /  CEA  Date Value Ref Range Status  11/04/2023 8.1 (H) 0.0 - 4.7 ng/mL Final    Comment:    (NOTE)                             Nonsmokers          <3.9                             Smokers             <5.6 Roche [employer]ostics Electrochemiluminescence Immunoassay (ECLIA) Values obtained with different assay methods or kits cannot be used interchangeably.  Results cannot be interpreted as absolute evidence of the presence or absence of malignant disease. Performed At: [facility] [address] [place], [place] [id number] Jolene Schimke MD [id number]    No results found for: "PSA1" No results found for: "CAN199" No results found for: "CAN125"  No results found for: "TOTALPROTELP", "ALBUMINELP", "A1GS", "A2GS", "BETS", "BETA2SER", "GAMS", "MSPIKE", "SPEI" No results found for: "TIBC", "FERRITIN", "IRONPCTSAT" No results found for: "LDH"   STUDIES:  MM 3D SCREENING MAMMOGRAM BILATERAL  BREAST  Result Date: 11/16/2023 CLINICAL DATA:  Screening. EXAM: DIGITAL SCREENING BILATERAL MAMMOGRAM WITH TOMOSYNTHESIS AND CAD TECHNIQUE: Bilateral screening digital craniocaudal and mediolateral oblique mammograms were obtained. Bilateral screening digital breast tomosynthesis was performed. The images were evaluated with computer-aided detection. COMPARISON:  Previous exam(s). ACR Breast Density Category d: The breasts are extremely dense, which lowers the sensitivity of mammography. FINDINGS: There are no findings suspicious for malignancy. IMPRESSION: No mammographic evidence of malignancy. A result letter of this screening mammogram will be mailed directly to the patient. RECOMMENDATION: Screening mammogram in one year. (Code:SM-B-01Y) BI-RADS CATEGORY  1: Negative. Electronically Signed   By: Edwin Cap M.D.   On: 11/16/2023 16:23   CT Abdomen Pelvis W Contrast  Result Date: 11/06/2023 CLINICAL DATA:  Colon cancer stage II/III, monitor. * Tracking Code: BO * EXAM: CT ABDOMEN AND PELVIS WITH CONTRAST TECHNIQUE: Multidetector CT imaging of the abdomen and pelvis was performed using the standard protocol following bolus administration of intravenous contrast. RADIATION DOSE REDUCTION: This exam was performed according to the departmental dose-optimization program which includes automated exposure control, adjustment of the mA and/or kV according to patient size and/or use of iterative reconstruction technique. CONTRAST:  30mL OMNIPAQUE IOHEXOL 300 MG/ML SOLN, OMNIPAQUE IOHEXOL 300 MG/ML SOLN COMPARISON:  Multiple priors including CT November 03, 2022. FINDINGS: Lower chest: Motion degraded examination of the lung bases. Hepatobiliary: No suspicious hepatic lesion. Cholelithiasis without findings of acute cholecystitis. No biliary ductal dilation. Pancreas: No pancreatic ductal dilation or evidence of acute inflammation. Stable 2-3 mm cystic lesion in the pancreatic body/tail junction on  image 22/2 likely reflecting a side branch IPMN. Spleen: No splenomegaly or focal splenic lesion. Adrenals/Urinary Tract: No suspicious adrenal mass. No hydronephrosis. Kidneys demonstrate symmetric enhancement. Urinary bladder is unremarkable for degree of distension. Stomach/Bowel: Radiopaque enteric contrast material traverses the ascending colon. Stomach is unremarkable for degree of distension. No pathologic dilation of small or large bowel. No evidence of acute bowel inflammation. Moderate volume of formed stool in the colon. Vascular/Lymphatic: Aortic atherosclerosis. Smooth IVC contours. The portal, splenic and superior mesenteric veins are patent. No pathologically enlarged abdominal or pelvic lymph nodes. Reproductive: Uterine fibroids. Other: No significant abdominopelvic free fluid. No discrete peritoneal or omental nodularity. Musculoskeletal: No aggressive lytic or blastic lesion of bone. IMPRESSION: 1. No evidence of metastatic disease in the abdomen or pelvis. 2. Stable 2-3 mm cystic lesion in the pancreatic body/tail junction likely reflecting a side branch IPMN. Recommend continued attention on follow-up imaging. 3. Cholelithiasis without findings of acute cholecystitis. 4. Uterine fibroids. 5.  Aortic Atherosclerosis (ICD10-I70.0). Electronically Signed   By: Maudry Mayhew M.D.   On: 11/06/2023 09:04

## 2023-11-23 NOTE — Assessment & Plan Note (Signed)
Annual exam as documented. . Immunization and cancer screening needs are specifically addressed at this visit.  

## 2023-11-23 NOTE — Assessment & Plan Note (Signed)
Patient educated about the importance of limiting  Carbohydrate intake , the need to commit to daily physical activity for a minimum of 30 minutes , and to commit weight loss. The fact that changes in all these areas will reduce or eliminate all together the development of diabetes is stressed.      Latest Ref Rng & Units 11/04/2023    2:17 PM 06/03/2023    9:17 AM 05/06/2023   10:52 AM 11/17/2022    1:14 PM 11/17/2022    1:12 PM  Diabetic Labs  HbA1c 4.8 - 5.6 %  6.2    6.0   Micro/Creat Ratio 0 - 29 mg/g creat    9    Chol 100 - 199 mg/dL  440    102   HDL >72 mg/dL  47    52   Calc LDL 0 - 99 mg/dL  72    97   Triglycerides 0 - 149 mg/dL  536    644   Creatinine 0.44 - 1.00 mg/dL 0.34   7.42   5.95       11/23/2023    4:07 PM 11/22/2023   10:18 AM 11/22/2023   10:13 AM 11/22/2023    8:40 AM 09/24/2023    8:32 AM 06/01/2023    3:48 PM 06/01/2023    3:03 PM  BP/Weight  Systolic BP 128 91 71 129  120 103  Diastolic BP 74 62 47 81  80 68  Wt. (Lbs) 131.12   129 130  129.04  BMI 23.23 kg/m2   22.85 kg/m2 23.03 kg/m2  22.86 kg/m2      Latest Ref Rng & Units 11/17/2022   11:00 AM 11/20/2021   12:00 AM  Foot/eye exam completion dates  Eye Exam No Retinopathy  No Retinopathy      Foot Form Completion  Done      This result is from an external source.    Updated lab needed at/ before next visit.

## 2023-11-23 NOTE — Progress Notes (Signed)
Andrea Simon     MRN: [id number]      DOB: 09/05/1956  Chief Complaint  Patient presents with   Annual Exam    CPE herniated disc pain    HPI: Patient is in for annual physical exam. Low back pain x 1 week, no inciting factor, no new weakness , numbness or incontinence, last flare 12 years ago, x ray of spine normal at the time  No topicals or compresses used, dong stretche and this helps  Recent labs,  are reviewed. Immunization is reviewed , and  updated    PE: BP 128/74 (BP Location: Right Arm, Patient Position: Sitting, Cuff Size: Normal)   Pulse 77   Ht 5\' 3"  (1.6 m)   Wt 131 lb 1.9 oz (59.5 kg)   SpO2 95%   BMI 23.23 kg/m   Pleasant  female, alert and oriented x 3, in no cardio-pulmonary distress. Afebrile. HEENT No facial trauma or asymetry.   Extra occullar muscles intact.. External ears normal, . Neck: supple  Chest: Clear to ascultation bilaterally.No crackles or wheezes. Non tender to palpation  Breast: Normal mammogram in 2024, not examined  Cardiovascular system; Heart sounds normal,  S1 and  S2 ,no S3.  No murmur, or thrill.  Peripheral pulses normal.  Musculoskeletal exam: Full ROM of spine, hips , shoulders and knees. No deformity ,swelling or crepitus noted. No muscle wasting or atrophy.   Neurologic: Cranial nerves 2 to 12 intact. Power, tone ,sensation  normal throughout. No disturbance in gait. No tremor.  Skin: Intact, no ulceration, erythema , scaling or rash noted. Pigmentation normal throughout  Psych; Normal mood and affect. Judgement and concentration normal   Assessment & Plan:  Annual physical exam Annual exam as documented.  Immunization and cancer screening needs are specifically addressed at this visit.   Encounter for immunization After obtaining informed consent, the influenza vaccine is  administered , with no adverse effect noted at the time of administration.   NICOTINE ADDICTION Asked:confirms  currently smokes cigarettes PPD Assess: Unwilling to set a quit date, not cutting back currently Advise: needs to QUIT to reduce risk of cancer, cardio and cerebrovascular disease Assist: counseled for 5 minutes and literature provided Arrange: follow up in 2 to 4 months   Prediabetes Patient educated about the importance of limiting  Carbohydrate intake , the need to commit to daily physical activity for a minimum of 30 minutes , and to commit weight loss. The fact that changes in all these areas will reduce or eliminate all together the development of diabetes is stressed.      Latest Ref Rng & Units 11/04/2023    2:17 PM 06/03/2023    9:17 AM 05/06/2023   10:52 AM 11/17/2022    1:14 PM 11/17/2022    1:12 PM  Diabetic Labs  HbA1c 4.8 - 5.6 %  6.2    6.0   Micro/Creat Ratio 0 - 29 mg/g creat    9    Chol 100 - 199 mg/dL  147    829   HDL >56 mg/dL  47    52   Calc LDL 0 - 99 mg/dL  72    97   Triglycerides 0 - 149 mg/dL  213    086   Creatinine 0.44 - 1.00 mg/dL 5.78   4.69   6.29       11/23/2023    4:07 PM 11/22/2023   10:18 AM 11/22/2023   10:13 AM 11/22/2023  8:40 AM 09/24/2023    8:32 AM 06/01/2023    3:48 PM 06/01/2023    3:03 PM  BP/Weight  Systolic BP 128 91 71 129  120 103  Diastolic BP 74 62 47 81  80 68  Wt. (Lbs) 131.12   129 130  129.04  BMI 23.23 kg/m2   22.85 kg/m2 23.03 kg/m2  22.86 kg/m2      Latest Ref Rng & Units 11/17/2022   11:00 AM 11/20/2021   12:00 AM  Foot/eye exam completion dates  Eye Exam No Retinopathy  No Retinopathy      Foot Form Completion  Done      This result is from an external source.    Updated lab needed at/ before next visit.   Hyperlipemia Hyperlipidemia:Low fat diet discussed and encouraged.   Lipid Panel  Lab Results  Component Value Date   CHOL 138 06/03/2023   HDL 47 06/03/2023   LDLCALC 72 06/03/2023   TRIG 100 06/03/2023   CHOLHDL 2.9 06/03/2023     Updated lab needed at/ before next visit.   Essential  hypertension Controlled, no change in medication DASH diet and commitment to daily physical activity for a minimum of 30 minutes discussed and encouraged, as a part of hypertension management. The importance of attaining a healthy weight is also discussed.     11/23/2023    4:07 PM 11/22/2023   10:18 AM 11/22/2023   10:13 AM 11/22/2023    8:40 AM 09/24/2023    8:32 AM 06/01/2023    3:48 PM 06/01/2023    3:03 PM  BP/Weight  Systolic BP 128 91 71 129  120 103  Diastolic BP 74 62 47 81  80 68  Wt. (Lbs) 131.12   129 130  129.04  BMI 23.23 kg/m2   22.85 kg/m2 23.03 kg/m2  22.86 kg/m2

## 2023-11-23 NOTE — Assessment & Plan Note (Signed)
Controlled, no change in medication DASH diet and commitment to daily physical activity for a minimum of 30 minutes discussed and encouraged, as a part of hypertension management. The importance of attaining a healthy weight is also discussed.     11/23/2023    4:07 PM 11/22/2023   10:18 AM 11/22/2023   10:13 AM 11/22/2023    8:40 AM 09/24/2023    8:32 AM 06/01/2023    3:48 PM 06/01/2023    3:03 PM  BP/Weight  Systolic BP 128 91 71 129  120 103  Diastolic BP 74 62 47 81  80 68  Wt. (Lbs) 131.12   129 130  129.04  BMI 23.23 kg/m2   22.85 kg/m2 23.03 kg/m2  22.86 kg/m2

## 2023-11-23 NOTE — Assessment & Plan Note (Signed)
Asked:confirms currently smokes cigarettes PPD Assess: Unwilling to set a quit date, not cutting back currently Advise: needs to QUIT to reduce risk of cancer, cardio and cerebrovascular disease Assist: counseled for 5 minutes and literature provided Arrange: follow up in 2 to 4 months

## 2023-11-23 NOTE — Assessment & Plan Note (Signed)
Hyperlipidemia:Low fat diet discussed and encouraged.   Lipid Panel  Lab Results  Component Value Date   CHOL 138 06/03/2023   HDL 47 06/03/2023   LDLCALC 72 06/03/2023   TRIG 100 06/03/2023   CHOLHDL 2.9 06/03/2023     Updated lab needed at/ before next visit.

## 2023-11-23 NOTE — Assessment & Plan Note (Signed)
After obtaining informed consent, the influenza vaccine is  administered , with no adverse effect noted at the time of administration.

## 2023-11-24 NOTE — Progress Notes (Signed)
I have reviewed and agree with the  documentation. Milus Mallick. Lodema Hong, MD [facility] Primary Care Location Provider: office Location Patient: office

## 2023-11-25 ENCOUNTER — Inpatient Hospital Stay: Payer: Medicare HMO | Attending: Hematology | Admitting: Hematology

## 2023-11-25 VITALS — BP 116/73 | HR 72 | Temp 96.5°F | Resp 18 | Wt 130.6 lb

## 2023-11-25 DIAGNOSIS — D259 Leiomyoma of uterus, unspecified: Secondary | ICD-10-CM | POA: Insufficient documentation

## 2023-11-25 DIAGNOSIS — C184 Malignant neoplasm of transverse colon: Secondary | ICD-10-CM | POA: Diagnosis present

## 2023-11-25 DIAGNOSIS — D7589 Other specified diseases of blood and blood-forming organs: Secondary | ICD-10-CM | POA: Diagnosis not present

## 2023-11-25 DIAGNOSIS — K862 Cyst of pancreas: Secondary | ICD-10-CM | POA: Diagnosis not present

## 2023-11-25 DIAGNOSIS — Z87891 Personal history of nicotine dependence: Secondary | ICD-10-CM | POA: Diagnosis not present

## 2023-11-25 DIAGNOSIS — E785 Hyperlipidemia, unspecified: Secondary | ICD-10-CM | POA: Insufficient documentation

## 2023-11-25 DIAGNOSIS — Z801 Family history of malignant neoplasm of trachea, bronchus and lung: Secondary | ICD-10-CM | POA: Insufficient documentation

## 2023-11-25 DIAGNOSIS — F17210 Nicotine dependence, cigarettes, uncomplicated: Secondary | ICD-10-CM | POA: Insufficient documentation

## 2023-11-25 DIAGNOSIS — I10 Essential (primary) hypertension: Secondary | ICD-10-CM | POA: Insufficient documentation

## 2023-11-25 DIAGNOSIS — Z79899 Other long term (current) drug therapy: Secondary | ICD-10-CM | POA: Insufficient documentation

## 2023-11-25 DIAGNOSIS — Z83438 Family history of other disorder of lipoprotein metabolism and other lipidemia: Secondary | ICD-10-CM | POA: Insufficient documentation

## 2023-11-25 DIAGNOSIS — I700 Atherosclerosis of aorta: Secondary | ICD-10-CM | POA: Diagnosis not present

## 2023-11-25 DIAGNOSIS — K8020 Calculus of gallbladder without cholecystitis without obstruction: Secondary | ICD-10-CM | POA: Insufficient documentation

## 2023-11-25 DIAGNOSIS — Z8249 Family history of ischemic heart disease and other diseases of the circulatory system: Secondary | ICD-10-CM | POA: Insufficient documentation

## 2023-11-25 DIAGNOSIS — G629 Polyneuropathy, unspecified: Secondary | ICD-10-CM | POA: Insufficient documentation

## 2023-11-25 NOTE — Patient Instructions (Signed)
[facility] at [facility] Discharge Instructions   You were seen and examined today by Dr. Ellin Saba.  He reviewed the results of your lab work which are normal/stable.   He reviewed the results of your CT scan which was normal. No evidence of cancer returning was seen on this exam.   We will see you back in . We will repeat lab work and a CT scan prior to this visit.   Return as scheduled.    Thank you for choosing [facility] at [facility] to provide your oncology and hematology care.  To afford each patient quality time with our provider, please arrive at least 15 minutes before your scheduled appointment time.   If you have a lab appointment with the Cancer Center please come in thru the Main Entrance and check in at the main information desk.  You need to re-schedule your appointment should you arrive 10 or more minutes late.  We strive to give you quality time with our providers, and arriving late affects you and other patients whose appointments are after yours.  Also, if you no show three or more times for appointments you may be dismissed from the clinic at the providers discretion.     Again, thank you for choosing [facility].  Our hope is that these requests will decrease the amount of time that you wait before being seen by our physicians.       _____________________________________________________________  Should you have questions after your visit to [facility], please contact our office at [phone] and follow the prompts.  Our office hours are 8:00 a.m. and 4:30 p.m. Monday - Friday.  Please note that voicemails left after 4:00 p.m. may not be returned until the following business day.  We are closed weekends and major holidays.  You do have access to a nurse 24-7, just call the main number to the clinic [phone] and do not press any options, hold on the line and a nurse will answer the phone.     For prescription refill requests, have your pharmacy contact our office and allow 72 hours.    Due to Covid, you will need to wear a mask upon entering the hospital. If you do not have a mask, a mask will be given to you at the Main Entrance upon arrival. For doctor visits, patients may have 1 support person age 66 or older with them. For treatment visits, patients can not have anyone with them due to social distancing guidelines and our immunocompromised population.

## 2023-11-30 ENCOUNTER — Encounter (HOSPITAL_COMMUNITY): Payer: Self-pay | Admitting: Gastroenterology

## 2023-12-03 DIAGNOSIS — E1149 Type 2 diabetes mellitus with other diabetic neurological complication: Secondary | ICD-10-CM | POA: Diagnosis not present

## 2023-12-04 LAB — HEMOGLOBIN A1C
Est. average glucose Bld gHb Est-mCnc: 131 mg/dL
Hgb A1c MFr Bld: 6.2 % — ABNORMAL HIGH (ref 4.8–5.6)

## 2024-01-10 ENCOUNTER — Ambulatory Visit (HOSPITAL_COMMUNITY)
Admission: RE | Admit: 2024-01-10 | Discharge: 2024-01-10 | Disposition: A | Discharge status: Home / Self Care | Payer: Medicare (Managed Care) | Source: Ambulatory Visit | Attending: Hematology | Admitting: Hematology

## 2024-01-10 DIAGNOSIS — Z87891 Personal history of nicotine dependence: Secondary | ICD-10-CM | POA: Diagnosis present

## 2024-02-23 ENCOUNTER — Inpatient Hospital Stay: Payer: Medicare (Managed Care)

## 2024-04-25 ENCOUNTER — Encounter: Payer: Self-pay | Admitting: Family Medicine

## 2024-04-25 ENCOUNTER — Ambulatory Visit (INDEPENDENT_AMBULATORY_CARE_PROVIDER_SITE_OTHER): Payer: Medicare (Managed Care) | Admitting: Family Medicine

## 2024-04-25 VITALS — BP 124/82 | HR 76 | Resp 18 | Ht 63.0 in | Wt 130.0 lb

## 2024-04-25 DIAGNOSIS — E785 Hyperlipidemia, unspecified: Secondary | ICD-10-CM

## 2024-04-25 DIAGNOSIS — E559 Vitamin D deficiency, unspecified: Secondary | ICD-10-CM | POA: Diagnosis not present

## 2024-04-25 DIAGNOSIS — I10 Essential (primary) hypertension: Secondary | ICD-10-CM

## 2024-04-25 DIAGNOSIS — Z0000 Encounter for general adult medical examination without abnormal findings: Secondary | ICD-10-CM

## 2024-04-25 DIAGNOSIS — F17200 Nicotine dependence, unspecified, uncomplicated: Secondary | ICD-10-CM

## 2024-04-25 DIAGNOSIS — E782 Mixed hyperlipidemia: Secondary | ICD-10-CM

## 2024-04-25 DIAGNOSIS — E1159 Type 2 diabetes mellitus with other circulatory complications: Secondary | ICD-10-CM

## 2024-04-25 DIAGNOSIS — E119 Type 2 diabetes mellitus without complications: Secondary | ICD-10-CM

## 2024-04-25 DIAGNOSIS — R7303 Prediabetes: Secondary | ICD-10-CM

## 2024-04-25 NOTE — Patient Instructions (Addendum)
 Please schedule welcome to medicare visit in June with MD  Pls provide general info on the welcome to  medicare visit to pt if available  Fasting lipid, urine ACR, cmp and EGFr, hBA1C, CBc, TSH and vit D 3 to 7 days before next visit  Please cut back on smoking as able  It is important that you exercise regularly at least 30 minutes 5 times a week. If you develop chest pain, have severe difficulty breathing, or feel very tired, stop exercising immediately and seek medical attention   Thanks for choosing [facility], we consider it a privelige to serve you.

## 2024-05-01 ENCOUNTER — Encounter: Payer: Self-pay | Admitting: Family Medicine

## 2024-05-01 DIAGNOSIS — E559 Vitamin D deficiency, unspecified: Secondary | ICD-10-CM | POA: Insufficient documentation

## 2024-05-01 NOTE — Assessment & Plan Note (Signed)
 Controlled, no change in medication DASH diet and commitment to daily physical activity for a minimum of 30 minutes discussed and encouraged, as a part of hypertension management. The importance of attaining a healthy weight is also discussed.     04/25/2024    4:02 PM 11/25/2023   11:28 AM 11/23/2023    4:07 PM 11/22/2023   10:18 AM 11/22/2023   10:13 AM 11/22/2023    8:40 AM 09/24/2023    8:32 AM  BP/Weight  Systolic BP 124 116 128 91 71 098   Diastolic BP 82 73 74 62 47 81   Wt. (Lbs) 130 130.6 131.12   129 130  BMI 23.03 kg/m2 23.13 kg/m2 23.23 kg/m2   22.85 kg/m2 23.03 kg/m2     '

## 2024-05-01 NOTE — Assessment & Plan Note (Signed)
 Hyperlipidemia:Low fat diet discussed and encouraged.   Lipid Panel  Lab Results  Component Value Date   CHOL 138 06/03/2023   HDL 47 06/03/2023   LDLCALC 72 06/03/2023   TRIG 100 06/03/2023   CHOLHDL 2.9 06/03/2023     Updated lab needed at/ before next visit.

## 2024-05-01 NOTE — Progress Notes (Signed)
 Andrea Simon     MRN: [id number]      DOB: 1956/03/24  Chief Complaint  Patient presents with   Medical Management of Chronic Issues    5 month follow up     HPI Andrea Simon is here for follow up and re-evaluation of chronic medical conditions, medication management and review of any available recent lab and radiology data.  Preventive health is updated, specifically  Cancer screening and Immunization.   Questions or concerns regarding consultations or procedures which the PT has had in the interim are  addressed. The PT denies any adverse reactions to current medications since the last visit.  There are no new concerns.  There are no specific complaints   ROS Denies recent fever or chills. Denies sinus pressure, nasal congestion, ear pain or sore throat. Denies chest congestion, productive cough or wheezing. Denies chest pains, palpitations and leg swelling Denies abdominal pain, nausea, vomiting,diarrhea or constipation.   Denies dysuria, frequency, hesitancy or incontinence. Denies joint pain, swelling and limitation in mobility. Denies headaches, seizures, numbness, or tingling. Denies depression, anxiety or insomnia. Denies skin break down or rash.   PE  BP 124/82   Pulse 76   Resp 18   Ht 5\' 3"  (1.6 m)   Wt 130 lb (59 kg)   SpO2 96%   BMI 23.03 kg/m   Patient alert and oriented and in no cardiopulmonary distress.  HEENT: No facial asymmetry, EOMI,     Neck supple .  Chest: Clear to auscultation bilaterally.  CVS: S1, S2 no murmurs, no S3.Regular rate.  ABD: Soft non tender.   Ext: No edema  MS: Adequate ROM spine, shoulders, hips and knees.  Skin: Intact, no ulcerations or rash noted.  Psych: Good eye contact, normal affect. Memory intact not anxious or depressed appearing.  CNS: CN 2-12 intact, power,  normal throughout.no focal deficits noted.   Assessment & Plan  NICOTINE ADDICTION Asked:confirms currently smokes cigarettes approx  10/day Assess: Unwilling to set a quit date, but is cutting back Advise: needs to QUIT to reduce risk of cancer, cardio and cerebrovascular disease Assist: counseled for 5 minutes and literature provided Arrange: follow up in 2 to 4 months '  Essential hypertension Controlled, no change in medication DASH diet and commitment to daily physical activity for a minimum of 30 minutes discussed and encouraged, as a part of hypertension management. The importance of attaining a healthy weight is also discussed.     04/25/2024    4:02 PM 11/25/2023   11:28 AM 11/23/2023    4:07 PM 11/22/2023   10:18 AM 11/22/2023   10:13 AM 11/22/2023    8:40 AM 09/24/2023    8:32 AM  BP/Weight  Systolic BP 124 116 128 91 71 409   Diastolic BP 82 73 74 62 47 81   Wt. (Lbs) 130 130.6 131.12   129 130  BMI 23.03 kg/m2 23.13 kg/m2 23.23 kg/m2   22.85 kg/m2 23.03 kg/m2     '  Type 2 diabetes, diet controlled (HCC) Diabetes associated with hypertension and hyperlipidemia  Andrea Simon is reminded of the importance of commitment to daily physical activity for 30 minutes or more, as able and the need to limit carbohydrate intake to 30 to 60 grams per meal to help with blood sugar control.   Andrea Simon is reminded of the importance of daily foot exam, annual eye examination, and good blood sugar, blood pressure and cholesterol control.     Latest  Ref Rng & Units 12/03/2023    2:33 PM 11/04/2023    2:17 PM 06/03/2023    9:17 AM 05/06/2023   10:52 AM 11/17/2022    1:14 PM  Diabetic Labs  HbA1c 4.8 - 5.6 % 6.2   6.2     Micro/Creat Ratio 0 - 29 mg/g creat     9   Chol 100 - 199 mg/dL   161     HDL >09 mg/dL   47     Calc LDL 0 - 99 mg/dL   72     Triglycerides 0 - 149 mg/dL   604     Creatinine 5.40 - 1.00 mg/dL  9.81   1.91        03/27/8294    4:02 PM 11/25/2023   11:28 AM 11/23/2023    4:07 PM 11/22/2023   10:18 AM 11/22/2023   10:13 AM 11/22/2023    8:40 AM 09/24/2023    8:32 AM  BP/Weight  Systolic BP  124 621 128 91 71 129   Diastolic BP 82 73 74 62 47 81   Wt. (Lbs) 130 130.6 131.12   129 130  BMI 23.03 kg/m2 23.13 kg/m2 23.23 kg/m2   22.85 kg/m2 23.03 kg/m2      11/15/2023    2:06 PM 11/17/2022   11:00 AM  Foot/eye exam completion dates  Eye Exam --       Foot Form Completion  Done     This result is from an external source.    Updated lab needed at/ before next visit.     Hyperlipidemia LDL goal <100 Hyperlipidemia:Low fat diet discussed and encouraged.   Lipid Panel  Lab Results  Component Value Date   CHOL 138 06/03/2023   HDL 47 06/03/2023   LDLCALC 72 06/03/2023   TRIG 100 06/03/2023   CHOLHDL 2.9 06/03/2023     Updated lab needed at/ before next visit.   Vitamin D  deficiency disease Updated lab needed at/ before next visit.

## 2024-05-01 NOTE — Assessment & Plan Note (Signed)
 Diabetes associated with hypertension and hyperlipidemia  [name] is reminded of the importance of commitment to daily physical activity for 30 minutes or more, as able and the need to limit carbohydrate intake to 30 to 60 grams per meal to help with blood sugar control.   [name] is reminded of the importance of daily foot exam, annual eye examination, and good blood sugar, blood pressure and cholesterol control.     Latest Ref Rng & Units 12/03/2023    2:33 PM 11/04/2023    2:17 PM 06/03/2023    9:17 AM 05/06/2023   10:52 AM 11/17/2022    1:14 PM  Diabetic Labs  HbA1c 4.8 - 5.6 % 6.2   6.2     Micro/Creat Ratio 0 - 29 mg/g creat     9   Chol 100 - 199 mg/dL   130     HDL >86 mg/dL   47     Calc LDL 0 - 99 mg/dL   72     Triglycerides 0 - 149 mg/dL   578     Creatinine 4.69 - 1.00 mg/dL  6.29   5.28        03/22/3243    4:02 PM 11/25/2023   11:28 AM 11/23/2023    4:07 PM 11/22/2023   10:18 AM 11/22/2023   10:13 AM 11/22/2023    8:40 AM 09/24/2023    8:32 AM  BP/Weight  Systolic BP 124 116 128 91 71 010   Diastolic BP 82 73 74 62 47 81   Wt. (Lbs) 130 130.6 131.12   129 130  BMI 23.03 kg/m2 23.13 kg/m2 23.23 kg/m2   22.85 kg/m2 23.03 kg/m2      11/15/2023    2:06 PM 11/17/2022   11:00 AM  Foot/eye exam completion dates  Eye Exam --       Foot Form Completion  Done     This result is from an external source.    Updated lab needed at/ before next visit.

## 2024-05-01 NOTE — Assessment & Plan Note (Signed)
Asked:confirms currently smokes cigarettes approx 10/day Assess: Unwilling to set a quit date, but is cutting back Advise: needs to QUIT to reduce risk of cancer, cardio and cerebrovascular disease Assist: counseled for 5 minutes and literature provided Arrange: follow up in 2 to 4 months  

## 2024-05-01 NOTE — Assessment & Plan Note (Signed)
 Updated lab needed at/ before next visit.

## 2024-05-12 ENCOUNTER — Other Ambulatory Visit: Payer: Self-pay

## 2024-05-12 DIAGNOSIS — C184 Malignant neoplasm of transverse colon: Secondary | ICD-10-CM

## 2024-05-16 ENCOUNTER — Inpatient Hospital Stay: Payer: Medicare (Managed Care) | Attending: Hematology

## 2024-05-16 ENCOUNTER — Encounter (HOSPITAL_COMMUNITY): Payer: Self-pay | Admitting: Radiology

## 2024-05-16 ENCOUNTER — Ambulatory Visit (HOSPITAL_COMMUNITY)
Admission: RE | Admit: 2024-05-16 | Discharge: 2024-05-16 | Disposition: A | Discharge status: Home / Self Care | Payer: Medicare (Managed Care) | Source: Ambulatory Visit | Attending: Hematology | Admitting: Hematology

## 2024-05-16 DIAGNOSIS — C184 Malignant neoplasm of transverse colon: Secondary | ICD-10-CM | POA: Insufficient documentation

## 2024-05-16 DIAGNOSIS — Z79899 Other long term (current) drug therapy: Secondary | ICD-10-CM | POA: Insufficient documentation

## 2024-05-16 LAB — CBC WITH DIFFERENTIAL/PLATELET
Abs Immature Granulocytes: 0.03 10*3/uL (ref 0.00–0.07)
Basophils Absolute: 0.1 10*3/uL (ref 0.0–0.1)
Basophils Relative: 1 %
Eosinophils Absolute: 0 10*3/uL (ref 0.0–0.5)
Eosinophils Relative: 0 %
HCT: 44 % (ref 36.0–46.0)
Hemoglobin: 15.3 g/dL — ABNORMAL HIGH (ref 12.0–15.0)
Immature Granulocytes: 0 %
Lymphocytes Relative: 46 %
Lymphs Abs: 3.1 10*3/uL (ref 0.7–4.0)
MCH: 33.8 pg (ref 26.0–34.0)
MCHC: 34.8 g/dL (ref 30.0–36.0)
MCV: 97.3 fL (ref 80.0–100.0)
Monocytes Absolute: 0.5 10*3/uL (ref 0.1–1.0)
Monocytes Relative: 7 %
Neutro Abs: 3.1 10*3/uL (ref 1.7–7.7)
Neutrophils Relative %: 46 %
Platelets: 248 10*3/uL (ref 150–400)
RBC: 4.52 MIL/uL (ref 3.87–5.11)
RDW: 12.9 % (ref 11.5–15.5)
WBC: 6.8 10*3/uL (ref 4.0–10.5)
nRBC: 0 % (ref 0.0–0.2)

## 2024-05-16 LAB — COMPREHENSIVE METABOLIC PANEL WITH GFR
ALT: 25 U/L (ref 0–44)
AST: 29 U/L (ref 15–41)
Albumin: 4.7 g/dL (ref 3.5–5.0)
Alkaline Phosphatase: 72 U/L (ref 38–126)
Anion gap: 10 (ref 5–15)
BUN: 16 mg/dL (ref 8–23)
CO2: 25 mmol/L (ref 22–32)
Calcium: 10 mg/dL (ref 8.9–10.3)
Chloride: 104 mmol/L (ref 98–111)
Creatinine, Ser: 0.79 mg/dL (ref 0.44–1.00)
GFR, Estimated: >60 mL/min (ref >60)
Glucose, Bld: 87 mg/dL (ref 70–99)
Potassium: 3.9 mmol/L (ref 3.5–5.1)
Sodium: 139 mmol/L (ref 135–145)
Total Bilirubin: 1.3 mg/dL — ABNORMAL HIGH (ref 0.0–1.2)
Total Protein: 8 g/dL (ref 6.5–8.1)

## 2024-05-16 LAB — VITAMIN B12: Vitamin B-12: >7500 pg/mL — ABNORMAL HIGH (ref 180–914)

## 2024-05-16 LAB — FOLATE: Folate: 12.6 ng/mL (ref >5.9)

## 2024-05-16 MED ORDER — IOHEXOL 300 MG/ML  SOLN
100.0000 mL | Freq: Once | INTRAMUSCULAR | Status: AC | PRN
  Administered 2024-05-16: 100 mL via INTRAVENOUS

## 2024-05-17 LAB — CEA: CEA: 8.1 ng/mL — ABNORMAL HIGH (ref 0.0–4.7)

## 2024-05-25 ENCOUNTER — Inpatient Hospital Stay: Payer: Medicare (Managed Care) | Attending: Hematology | Admitting: Hematology

## 2024-05-25 VITALS — BP 110/78 | HR 83 | Temp 98.3°F | Resp 18

## 2024-05-25 DIAGNOSIS — C184 Malignant neoplasm of transverse colon: Secondary | ICD-10-CM | POA: Insufficient documentation

## 2024-05-25 DIAGNOSIS — Z809 Family history of malignant neoplasm, unspecified: Secondary | ICD-10-CM | POA: Diagnosis not present

## 2024-05-25 DIAGNOSIS — Z801 Family history of malignant neoplasm of trachea, bronchus and lung: Secondary | ICD-10-CM | POA: Insufficient documentation

## 2024-05-25 DIAGNOSIS — Z7982 Long term (current) use of aspirin: Secondary | ICD-10-CM | POA: Diagnosis not present

## 2024-05-25 DIAGNOSIS — I10 Essential (primary) hypertension: Secondary | ICD-10-CM | POA: Insufficient documentation

## 2024-05-25 DIAGNOSIS — G479 Sleep disorder, unspecified: Secondary | ICD-10-CM | POA: Diagnosis not present

## 2024-05-25 DIAGNOSIS — F17210 Nicotine dependence, cigarettes, uncomplicated: Secondary | ICD-10-CM | POA: Diagnosis not present

## 2024-05-25 DIAGNOSIS — G629 Polyneuropathy, unspecified: Secondary | ICD-10-CM | POA: Diagnosis not present

## 2024-05-25 DIAGNOSIS — E785 Hyperlipidemia, unspecified: Secondary | ICD-10-CM | POA: Diagnosis not present

## 2024-05-25 DIAGNOSIS — Z79899 Other long term (current) drug therapy: Secondary | ICD-10-CM | POA: Diagnosis not present

## 2024-05-25 DIAGNOSIS — Z83438 Family history of other disorder of lipoprotein metabolism and other lipidemia: Secondary | ICD-10-CM | POA: Insufficient documentation

## 2024-05-25 DIAGNOSIS — Z8249 Family history of ischemic heart disease and other diseases of the circulatory system: Secondary | ICD-10-CM | POA: Insufficient documentation

## 2024-05-25 DIAGNOSIS — Z122 Encounter for screening for malignant neoplasm of respiratory organs: Secondary | ICD-10-CM | POA: Diagnosis not present

## 2024-05-25 NOTE — Progress Notes (Signed)
 [facility] [address] [place] [address]    Clinic Day:  05/25/2024  Referring physician: Towanda Simon, Andrea Simon  Patient Care Team: Andrea Andrea Simon, Andrea Simon as PCP - General (Family Medicine)   ASSESSMENT & PLAN:   Assessment: 1.  Stage III (PT4BPN1A) transverse colon adenocarcinoma: -Right colectomy on 01/09/2019, 1/24 lymph nodes positive, no tumor deposits, no perineural invasion, grade 2, tumor extending through serosa into adherent omentum, margins negative. -PET scan on 01/31/2019 shows focal hypermetabolic activity identified in the ileocecal anastomosis with SUV 9. -12 cycles of FOLFOX from 03/01/2019 through 08/22/2019. - Colonoscopy on 10/16/2020: Did not show any major abnormalities.   2.  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  MSI was high by PCR.  BRAF testing was negative. -MLH1 hyper methylation was present.  Thought to be sporadic cancer.   3.  Smoking history: -40+ pack year smoker. -CT low-dose on 12/05/2019 was lung RADS 2 with emphysema. -CT chest on 12/25/2020 was lung RADS 2.    Plan: 1.  Stage III (PT4BPN1A) transverse colon adenocarcinoma: - She denies any change in bowel habits.  No bleeding per rectum or melena. - Had colonoscopy in December 2024: Anastomotic site within normal limits with no significant findings. - Labs from 05/16/2024: Normal LFTs.  CBC was normal.  Mild elevated hemoglobin likely from smoking.  CEA is also mildly elevated at 8.1 but stable.  Her baseline CEA is between 6-8. - CTAP on 05/16/2024: No evidence of metastatic disease. - She will complete 5 years after treatment in September.  I have recommended that she have port removed.  No further imaging is necessary unless labs/clinical condition dictates.  She will come back in 1 year for follow-up with CEA level and LFTs.   2.  Peripheral neuropathy: - Numbness in the feet is stable.  Not on any medications.   3.  Smoking history: - Current  active smoker.  We reviewed CT chest lung cancer and CT scan from 01/10/2024: Lung RADS 2S.  She will have scan repeated next year.    Orders Placed This Encounter  Procedures   CT CHEST LUNG CA SCREEN LOW DOSE W/O CM    Standing Status:   Future    Expected Date:   01/10/2025    Expiration Date:   05/25/2025    Preferred Imaging Location?:   [facility]    Release to patient:   Immediate [1]   CEA    Standing Status:   Future    Expected Date:   05/25/2025    Expiration Date:   05/25/2025   CBC with Differential/Platelet    Standing Status:   Future    Expected Date:   05/25/2025    Expiration Date:   05/25/2025    Release to patient:   Immediate   Comprehensive metabolic panel with GFR    Standing Status:   Future    Expected Date:   05/25/2025    Expiration Date:   05/25/2025    Release to patient:   Immediate      I,Helena R Teague,acting as a scribe for Andrea Boros, Andrea Simon.,have documented all relevant documentation on the behalf of Andrea Boros, Andrea Simon,as directed by  Andrea Boros, Andrea Simon while in the presence of Andrea Boros, Andrea Simon.  I, Andrea Andrea Simon, have reviewed the above documentation for accuracy and completeness, and I agree with the above.     Andrea Boros, Andrea Simon   6/5/202512:40 PM  CHIEF COMPLAINT:   Diagnosis: stage III transverse colon adenocarcinoma    Cancer Staging  No matching staging information was found for the patient.    Prior Therapy: 1. Laparoscopic right colectomy on 01/09/2019. 2. FOLFOX x 12 cycles from 03/01/2019 to 08/22/2019.  Current Therapy:  surveillance    HISTORY OF PRESENT ILLNESS:   Oncology History  Colon cancer (HCC)  01/09/2019 Initial Diagnosis   Colon cancer (HCC)   03/01/2019 - 08/24/2019 Chemotherapy   Patient is on Treatment Plan : COLORECTAL FOLFOX q14d x 6 months        INTERVAL HISTORY:   Andrea Andrea Simon is a 68 y.o. female presenting to clinic today for follow up of stage III transverse  colon adenocarcinoma. She was last seen by me on 11/25/23.  Since her last visit, she underwent CT lung cancer screening on 01/10/24 that found: Lung-RADS 2S, benign appearance or behavior.   She had CT AP on 05/16/24 that found: No evidence for metastatic disease within the abdomen or pelvis.   Today, she states that she is doing well overall. Her appetite level is at 100%. Her energy level is at 90%. [name] is not taking B 12 supplements or multivitamins. She continues to actively smoke.   PAST MEDICAL HISTORY:   Past Medical History: Past Medical History:  Diagnosis Date   Cancer (HCC) 11/2018   Phreesia 09/16/2020   Cigarette smoker    Hyperlipidemia    Hypertension    Insomnia due to anxiety and fear 01/27/2019   Multinodular goiter    Pre-diabetes    Prediabetes     Surgical History: Past Surgical History:  Procedure Laterality Date   BIOPSY  12/07/2018   Procedure: BIOPSY;  Surgeon: Ruby Corporal, Andrea Simon;  [facility];  Service: Endoscopy;;  sigmoid colon   COLON RESECTION N/A 01/09/2019   Procedure: LAPAROSCOPIC RIGHT HEMICOLECTOMY;  Surgeon: Awilda Bogus, Andrea Simon;  Location: [facility];  Service: General;  Laterality: N/A;   COLON SURGERY N/A    Phreesia 09/16/2020   COLONOSCOPY N/A 12/07/2018   Procedure: COLONOSCOPY;  Surgeon: Ruby Corporal, Andrea Simon;  [facility];  Service: Endoscopy;  Laterality: N/A;  12:45   COLONOSCOPY WITH PROPOFOL  N/A 10/16/2020   Procedure: COLONOSCOPY WITH PROPOFOL ;  Surgeon: Ruby Corporal, Andrea Simon;  [facility];  Service: Endoscopy;  Laterality: N/A;  955   COLONOSCOPY WITH PROPOFOL  N/A 11/22/2023   Procedure: COLONOSCOPY WITH PROPOFOL ;  Surgeon: Hargis Lias, Andrea Simon;  [facility];  Service: Endoscopy;  Laterality: N/A;  9:45AM;ASA 1-2   DILATION AND CURETTAGE OF UTERUS  2006   FNA thyroid   2011 and 2019   benign   HYSTEROSCOPY WITH D & C N/A 03/04/2022   Procedure: DILATATION AND CURETTAGE  /HYSTEROSCOPY;  Surgeon: Wendelyn Halter, Andrea Simon;  Location: [facility];  Service: Gynecology;  Laterality: N/A;   POLYPECTOMY  12/07/2018   Procedure: POLYPECTOMY;  Surgeon: Ruby Corporal, Andrea Simon;  [facility];  Service: Endoscopy;;  splenic flexure (CS x1)   POLYPECTOMY  10/16/2020   Procedure: POLYPECTOMY;  Surgeon: Ruby Corporal, Andrea Simon;  [facility];  Service: Endoscopy;;   POLYPECTOMY N/A 03/04/2022   Procedure: Removal of Endometrial Polyp;  Surgeon: Wendelyn Halter, Andrea Simon;  Location: [facility];  Service: Gynecology;  Laterality: N/A;   PORTACATH PLACEMENT Left 02/15/2019   Procedure: INSERTION PORT-A-CATH;  Surgeon: Awilda Bogus, Andrea Simon;  Location: [facility];  Service: General;  Laterality: Left;  TUBAL LIGATION  1992    Social History: Social History   Socioeconomic History   Marital status: Married    Spouse name: Not on file   Number of children: 1   Years of education: Not on file   Highest education level: Not on file  Occupational History   Occupation: Advertising copywriter - [employer]   Tobacco Use   Smoking status: Every Day    Current packs/day: 0.75    Average packs/day: 0.8 packs/day for 45.0 years (33.8 ttl pk-yrs)    Types: Cigarettes   Smokeless tobacco: Never  Vaping Use   Vaping status: Never Used  Substance and Sexual Activity   Alcohol use: Yes    Alcohol/week: 0.0 standard drinks of alcohol    Comment: occasionally   Drug use: No   Sexual activity: Yes    Birth control/protection: None, Surgical  Other Topics Concern   Not on file  Social History Narrative   Not on file   Social Drivers of Health   Financial Resource Strain: Low Risk  (02/03/2022)   Overall Financial Resource Strain (CARDIA)    Difficulty of Paying Living Expenses: Not hard at all  Food Insecurity: No Food Insecurity (02/03/2022)   Hunger Vital Sign    Worried About Running Out of Food in the Last Year: Never true    Ran Out of Food in the Last Year: Never true  Transportation  Needs: No Transportation Needs (02/03/2022)   PRAPARE - Administrator, Civil Service (Medical): No    Lack of Transportation (Non-Medical): No  Physical Activity: Insufficiently Active (02/03/2022)   Exercise Vital Sign    Days of Exercise per Week: 3 days    Minutes of Exercise per Session: 30 min  Stress: No Stress Concern Present (02/03/2022)   [employer] of Occupational Health - Occupational Stress Questionnaire    Feeling of Stress : Only a little  Social Connections: Moderately Isolated (02/03/2022)   Social Connection and Isolation Panel [NHANES]    Frequency of Communication with Friends and Family: More than three times a week    Frequency of Social Gatherings with Friends and Family: Three times a week    Attends Religious Services: Never    Active Member of Clubs or Organizations: No    Attends Banker Meetings: Never    Marital Status: Married  Catering manager Violence: Not At Risk (02/03/2022)   Humiliation, Afraid, Rape, and Kick questionnaire    Fear of Current or Ex-Partner: No    Emotionally Abused: No    Physically Abused: No    Sexually Abused: No    Family History: Family History  Problem Relation Age of Onset   Hypertension Mother        AAA   Coronary artery disease Mother    Hyperlipidemia Mother    Cancer Mother        lung   Hypertension Father    Cancer Brother     Current Medications:  Current Outpatient Medications:    amLODipine  (NORVASC ) 5 MG tablet, Take 1 tablet (5 mg total) by mouth daily., Disp: 90 tablet, Rfl: 3   aspirin EC 81 MG tablet, Take 1 tablet (81 mg total) by mouth every evening., Disp: 30 tablet, Rfl: 11   Blood Glucose Monitoring Suppl (ACCU-CHEK GUIDE ME) w/Device KIT, USE TO CHECK GLUCOSE ONCE DAILY, Disp: 1 kit, Rfl: 0   Calcium  Carbonate-Vitamin D  600-400 MG-UNIT tablet, Take 1 tablet by mouth daily. , Disp: ,  Rfl:    diphenhydrAMINE  (SOMINEX) 25 MG tablet, Take 25 mg by mouth at bedtime.,  Disp: , Rfl:    glucose blood test strip, Use as instructed once daily dx e11.9, Disp: 100 each, Rfl: 5   Lancets 30G MISC, Once daily testing dx e11.9, Disp: 100 each, Rfl: 5   Multiple Vitamins-Minerals (MULTIVITAMIN WITH MINERALS) tablet, Take 1 tablet by mouth daily., Disp: , Rfl:    Na Sulfate-K Sulfate-Mg Sulf 17.5-3.13-1.6 GM/177ML SOLN, Use as directed, Disp: 354 mL, Rfl: 0   rosuvastatin  (CRESTOR ) 10 MG tablet, Take 1 tablet (10 mg total) by mouth daily., Disp: 90 tablet, Rfl: 3 No current facility-administered medications for this visit.  Facility-Administered Medications Ordered in Other Visits:    sodium chloride  flush (NS) 0.9 % injection 10 mL, 10 mL, Intravenous, PRN, Michalina Calbert, Andrea Simon, 10 mL at 01/09/21 1430   Allergies: No Known Allergies  REVIEW OF SYSTEMS:   Review of Systems  Constitutional:  Negative for chills, fatigue and fever.  HENT:   Negative for lump/mass, mouth sores, nosebleeds, sore throat and trouble swallowing.   Eyes:  Negative for eye problems.  Respiratory:  Negative for cough and shortness of breath.   Cardiovascular:  Negative for chest pain, leg swelling and palpitations.  Gastrointestinal:  Negative for abdominal pain, constipation, diarrhea, nausea and vomiting.  Genitourinary:  Negative for bladder incontinence, difficulty urinating, dysuria, frequency, hematuria and nocturia.   Musculoskeletal:  Negative for arthralgias, back pain, flank pain, myalgias and neck pain.  Skin:  Negative for itching and rash.  Neurological:  Negative for dizziness, headaches and numbness.       +tingling feet  Hematological:  Does not bruise/bleed easily.  Psychiatric/Behavioral:  Positive for sleep disturbance. Negative for depression and suicidal ideas. The patient is not nervous/anxious.   All other systems reviewed and are negative.    VITALS:   Blood pressure 110/78, pulse 83, temperature 98.3 F (36.8 C), temperature source Oral, resp. rate 18,  SpO2 100%.  Wt Readings from Last 3 Encounters:  04/25/24 130 lb (59 kg)  11/25/23 130 lb 9.6 oz (59.2 kg)  11/23/23 131 lb 1.9 oz (59.5 kg)    There is no height or weight on file to calculate BMI.  Performance status (ECOG): 1 - Symptomatic but completely ambulatory  PHYSICAL EXAM:   Physical Exam Vitals and nursing note reviewed. Exam conducted with a chaperone present.  Constitutional:      Appearance: Normal appearance.  Cardiovascular:     Rate and Rhythm: Normal rate and regular rhythm.     Pulses: Normal pulses.     Heart sounds: Normal heart sounds.  Pulmonary:     Effort: Pulmonary effort is normal.     Breath sounds: Normal breath sounds.  Abdominal:     Palpations: Abdomen is soft. There is no hepatomegaly, splenomegaly or mass.     Tenderness: There is no abdominal tenderness.  Musculoskeletal:     Right lower leg: No edema.     Left lower leg: No edema.  Lymphadenopathy:     Cervical: No cervical adenopathy.     Right cervical: No superficial, deep or posterior cervical adenopathy.    Left cervical: No superficial, deep or posterior cervical adenopathy.     Upper Body:     Right upper body: No supraclavicular or axillary adenopathy.     Left upper body: No supraclavicular or axillary adenopathy.  Neurological:     General: No focal deficit present.  Mental Status: She is alert and oriented to person, place, and time.  Psychiatric:        Mood and Affect: Mood normal.        Behavior: Behavior normal.     LABS:      Latest Ref Rng & Units 05/16/2024   11:13 AM 11/04/2023    2:17 PM 05/06/2023   10:52 AM  CBC  WBC 4.0 - 10.5 K/uL 6.8  7.5  7.2   Hemoglobin 12.0 - 15.0 g/dL 04.5  40.9  81.1   Hematocrit 36.0 - 46.0 % 44.0  40.3  40.2   Platelets 150 - 400 K/uL 248  214  232       Latest Ref Rng & Units 05/16/2024   11:13 AM 11/04/2023    2:17 PM 05/06/2023   10:52 AM  CMP  Glucose 70 - 99 mg/dL 87  96  914   BUN 8 - 23 mg/dL 16  13  14     Creatinine 0.44 - 1.00 mg/dL 7.82  9.56  2.13   Sodium 135 - 145 mmol/L 139  138  137   Potassium 3.5 - 5.1 mmol/L 3.9  3.7  3.6   Chloride 98 - 111 mmol/L 104  103  103   CO2 22 - 32 mmol/L 25  27  26    Calcium  8.9 - 10.3 mg/dL 08.6  9.7  9.7   Total Protein 6.5 - 8.1 g/dL 8.0  7.7  7.5   Total Bilirubin 0.0 - 1.2 mg/dL 1.3  1.2  1.2   Alkaline Phos 38 - 126 U/L 72  71  68   AST 15 - 41 U/L 29  25  31    ALT 0 - 44 U/L 25  24  26       Lab Results  Component Value Date   CEA1 8.1 (H) 05/16/2024   /  CEA  Date Value Ref Range Status  05/16/2024 8.1 (H) 0.0 - 4.7 ng/mL Final    Comment:    (NOTE)                             Nonsmokers          <3.9                             Smokers             <5.6 Roche [employer]ostics Electrochemiluminescence Immunoassay (ECLIA) Values obtained with different assay methods or kits cannot be used interchangeably.  Results cannot be interpreted as absolute evidence of the presence or absence of malignant disease. Performed At: [facility] [address] [place], [place] [id number] Pearlean Botts Andrea Simon [id number]    No results found for: "PSA1" No results found for: "2125198613" No results found for: "CAN125"  No results found for: "TOTALPROTELP", "ALBUMINELP", "A1GS", "A2GS", "BETS", "BETA2SER", "GAMS", "MSPIKE", "SPEI" No results found for: "TIBC", "FERRITIN", "IRONPCTSAT" No results found for: "LDH"   STUDIES:   CT ABDOMEN PELVIS W CONTRAST Result Date: 05/16/2024 EXAMINATION: CT ABDOMEN PELVIS W CONTRAST CLINICAL INDICATION: Female, 68 years old. Colon cancer, stage II/III, monitor TECHNIQUE: Axial CT of the abdomen and pelvis with 100 cc Omnipaque  300 intravenous contrast. Multiplanar reformations provided. Unless otherwise specified, incidental thyroid , adrenal, renal lesions do not require dedicated imaging follow up. Additionally, any mentioned pulmonary nodules do not require dedicated imaging follow-up based on the Fleischner  guidelines unless otherwise specified. Coronary calcifications are not identified unless otherwise specified. COMPARISON: 11/04/2023 FINDINGS: The lung bases demonstrate minimal atelectatic changes. The heart is normal in size. The liver appears normal. The gallbladder is noted to contain a calcified stone. The spleen is normal. The pancreas contains a few subcentimeter cystic lesions likely representing side branch intraductal papillary mucinous neoplasms. The adrenals are normal. The kidneys are normal. The abdominal aorta is normal in caliber. Scattered atherosclerotic changes are present. The urinary bladder is normal. The uterus contains several fibroids. No obvious colonic mass. Large and small bowel loops are otherwise within normal limits. There is no free fluid or lymphadenopathy. There are mild degenerative changes of the spine. IMPRESSION: No evidence for metastatic disease within the abdomen or pelvis. DOSE REDUCTION: This exam was performed according to our departmental dose-optimization program which includes automated exposure control, adjustment of the mA and/or kV according to patient size and/or use of iterative reconstruction technique. Electronically signed by: [place] Engel Andrea Simon 05/16/2024 06:03 PM EDT RP Workstation: [id number]

## 2024-05-25 NOTE — Patient Instructions (Addendum)
[facility]  Discharge Instructions  You were seen and examined today by Dr. Ellin Saba.  Dr. Ellin Saba discussed your most recent lab work and CT scan which revealed that everything looks good and stable.  Follow-up as scheduled.    Thank you for choosing [facility] to provide your oncology and hematology care.   To afford each patient quality time with our provider, please arrive at least 15 minutes before your scheduled appointment time. You may need to reschedule your appointment if you arrive late (10 or more minutes). Arriving late affects you and other patients whose appointments are after yours.  Also, if you miss three or more appointments without notifying the office, you may be dismissed from the clinic at the provider's discretion.    Again, thank you for choosing [facility].  Our hope is that these requests will decrease the amount of time that you wait before being seen by our physicians.   If you have a lab appointment with the Cancer Center - please note that after April 8th, all labs will be drawn in the cancer center.  You do not have to check in or register with the main entrance as you have in the past but will complete your check-in at the cancer center.            _____________________________________________________________  Should you have questions after your visit to [facility], please contact our office at [phone] and follow the prompts.  Our office hours are 8:00 a.m. to 4:30 p.m. Monday - Thursday and 8:00 a.m. to 2:30 p.m. Friday.  Please note that voicemails left after 4:00 p.m. may not be returned until the following business day.  We are closed weekends and all major holidays.  You do have access to a nurse 24-7, just call the main number to the clinic [phone] and do not press any options, hold on the line and a nurse will answer the phone.    For prescription  refill requests, have your pharmacy contact our office and allow 72 hours.    Masks are no longer required in the cancer centers. If you would like for your care team to wear a mask while they are taking care of you, please let them know. You may have one support person who is at least 67 years old accompany you for your appointments.

## 2024-06-09 DIAGNOSIS — R7303 Prediabetes: Secondary | ICD-10-CM | POA: Diagnosis not present

## 2024-06-09 DIAGNOSIS — Z0000 Encounter for general adult medical examination without abnormal findings: Secondary | ICD-10-CM | POA: Diagnosis not present

## 2024-06-09 DIAGNOSIS — E782 Mixed hyperlipidemia: Secondary | ICD-10-CM | POA: Diagnosis not present

## 2024-06-09 DIAGNOSIS — I10 Essential (primary) hypertension: Secondary | ICD-10-CM | POA: Diagnosis not present

## 2024-06-09 DIAGNOSIS — E559 Vitamin D deficiency, unspecified: Secondary | ICD-10-CM | POA: Diagnosis not present

## 2024-06-11 ENCOUNTER — Ambulatory Visit: Payer: Self-pay | Admitting: Family Medicine

## 2024-06-11 LAB — HEMOGLOBIN A1C
Est. average glucose Bld gHb Est-mCnc: 128 mg/dL
Hgb A1c MFr Bld: 6.1 % — ABNORMAL HIGH (ref 4.8–5.6)

## 2024-06-11 LAB — LIPID PANEL
Chol/HDL Ratio: 3.2 ratio (ref 0.0–4.4)
Cholesterol, Total: 155 mg/dL (ref 100–199)
HDL: 48 mg/dL (ref >39)
LDL Chol Calc (NIH): 85 mg/dL (ref 0–99)
Triglycerides: 125 mg/dL (ref 0–149)
VLDL Cholesterol Cal: 22 mg/dL (ref 5–40)

## 2024-06-11 LAB — CBC WITH DIFFERENTIAL/PLATELET
Basophils Absolute: 0.1 10*3/uL (ref 0.0–0.2)
Basos: 1 %
EOS (ABSOLUTE): 0.1 10*3/uL (ref 0.0–0.4)
Eos: 1 %
Hematocrit: 41.8 % (ref 34.0–46.6)
Hemoglobin: 14.2 g/dL (ref 11.1–15.9)
Immature Grans (Abs): 0 10*3/uL (ref 0.0–0.1)
Immature Granulocytes: 0 %
Lymphocytes Absolute: 3.5 10*3/uL — ABNORMAL HIGH (ref 0.7–3.1)
Lymphs: 52 %
MCH: 33 pg (ref 26.6–33.0)
MCHC: 34 g/dL (ref 31.5–35.7)
MCV: 97 fL (ref 79–97)
Monocytes Absolute: 0.4 10*3/uL (ref 0.1–0.9)
Monocytes: 6 %
Neutrophils Absolute: 2.7 10*3/uL (ref 1.4–7.0)
Neutrophils: 40 %
Platelets: 228 10*3/uL (ref 150–450)
RBC: 4.3 x10E6/uL (ref 3.77–5.28)
RDW: 12.9 % (ref 11.7–15.4)
WBC: 6.8 10*3/uL (ref 3.4–10.8)

## 2024-06-11 LAB — CMP14+EGFR
ALT: 26 IU/L (ref 0–32)
AST: 30 IU/L (ref 0–40)
Albumin: 4.8 g/dL (ref 3.9–4.9)
Alkaline Phosphatase: 83 IU/L (ref 44–121)
BUN/Creatinine Ratio: 14 (ref 12–28)
BUN: 12 mg/dL (ref 8–27)
Bilirubin Total: 0.9 mg/dL (ref 0.0–1.2)
CO2: 22 mmol/L (ref 20–29)
Calcium: 10.1 mg/dL (ref 8.7–10.3)
Chloride: 103 mmol/L (ref 96–106)
Creatinine, Ser: 0.88 mg/dL (ref 0.57–1.00)
Globulin, Total: 2.8 g/dL (ref 1.5–4.5)
Glucose: 118 mg/dL — ABNORMAL HIGH (ref 70–99)
Potassium: 4.5 mmol/L (ref 3.5–5.2)
Sodium: 141 mmol/L (ref 134–144)
Total Protein: 7.6 g/dL (ref 6.0–8.5)
eGFR: 72 mL/min/{1.73_m2} (ref >59)

## 2024-06-11 LAB — TSH: TSH: 1.11 u[IU]/mL (ref 0.450–4.500)

## 2024-06-11 LAB — VITAMIN D 25 HYDROXY (VIT D DEFICIENCY, FRACTURES): Vit D, 25-Hydroxy: 37.4 ng/mL (ref 30.0–100.0)

## 2024-06-11 LAB — MICROALBUMIN / CREATININE URINE RATIO
Creatinine, Urine: 262 mg/dL
Microalb/Creat Ratio: 5 mg/g{creat} (ref 0–29)
Microalbumin, Urine: 13.6 ug/mL

## 2024-06-15 ENCOUNTER — Encounter: Payer: Self-pay | Admitting: General Surgery

## 2024-06-15 ENCOUNTER — Ambulatory Visit: Payer: Medicare (Managed Care) | Admitting: General Surgery

## 2024-06-15 VITALS — BP 94/62 | HR 82 | Temp 98.8°F | Resp 16 | Ht 63.0 in | Wt 132.0 lb

## 2024-06-15 DIAGNOSIS — Z95828 Presence of other vascular implants and grafts: Secondary | ICD-10-CM | POA: Insufficient documentation

## 2024-06-15 NOTE — Addendum Note (Signed)
 Addended by: SAUNDRA TAWNI DEL on: 06/15/2024 02:41 PM   Modules accepted: Orders

## 2024-06-15 NOTE — Progress Notes (Signed)
 [facility] History and Physical  Reason for Referral: Port in place, left subclavian  Referring Physician: Dr. Rogers   Chief Complaint   Follow-up     Andrea Simon is a 68 y.o. female.  HPI:   Discussed the use of AI scribe software for clinical note transcription with the patient, who gave verbal consent to proceed.  History of Present Illness Andrea Simon is a 68 year old female who presents for removal of a left chest port. She was referred by Dr. Katragadda for removal of the port.  She had a part of her colon removed in 2020 and underwent subsequent treatment. She reports that everything has been good since then, and she is now five years out from the procedure.  She has a port in her left chest that was used for treatment. The port has not caused any issues. She is tired of having to go get it flushed.   She is currently on baby aspirin, which she continues to take.     Past Medical History:  Diagnosis Date   Cancer (HCC) 11/2018   Phreesia 09/16/2020   Cigarette smoker    Hyperlipidemia    Hypertension    Insomnia due to anxiety and fear 01/27/2019   Multinodular goiter    Pre-diabetes    Prediabetes     Past Surgical History:  Procedure Laterality Date   BIOPSY  12/07/2018   Procedure: BIOPSY;  Surgeon: Golda Claudis PENNER, MD;  [facility];  Service: Endoscopy;;  sigmoid colon   COLON RESECTION N/A 01/09/2019   Procedure: LAPAROSCOPIC RIGHT HEMICOLECTOMY;  Surgeon: Kallie Manuelita BROCKS, MD;  [facility]: [facility];  Service: General;  Laterality: N/A;   COLON SURGERY N/A    Phreesia 09/16/2020   COLONOSCOPY N/A 12/07/2018   Procedure: COLONOSCOPY;  Surgeon: Golda Claudis PENNER, MD;  [facility];  Service: Endoscopy;  Laterality: N/A;  12:45   COLONOSCOPY WITH PROPOFOL  N/A 10/16/2020   Procedure: COLONOSCOPY WITH PROPOFOL ;  Surgeon: Golda Claudis PENNER, MD;  [facility];  Service: Endoscopy;  Laterality:  N/A;  955   COLONOSCOPY WITH PROPOFOL  N/A 11/22/2023   Procedure: COLONOSCOPY WITH PROPOFOL ;  Surgeon: Cinderella Deatrice FALCON, MD;  [facility];  Service: Endoscopy;  Laterality: N/A;  9:45AM;ASA 1-2   DILATION AND CURETTAGE OF UTERUS  2006   FNA thyroid   2011 and 2019   benign   HYSTEROSCOPY WITH D & C N/A 03/04/2022   Procedure: DILATATION AND CURETTAGE /HYSTEROSCOPY;  Surgeon: Jayne Vonn DEL, MD;  [facility]: [facility];  Service: Gynecology;  Laterality: N/A;   POLYPECTOMY  12/07/2018   Procedure: POLYPECTOMY;  Surgeon: Golda Claudis PENNER, MD;  [facility];  Service: Endoscopy;;  splenic flexure (CS x1)   POLYPECTOMY  10/16/2020   Procedure: POLYPECTOMY;  Surgeon: Golda Claudis PENNER, MD;  [facility];  Service: Endoscopy;;   POLYPECTOMY N/A 03/04/2022   Procedure: Removal of Endometrial Polyp;  Surgeon: Jayne Vonn DEL, MD;  [facility];  Service: Gynecology;  Laterality: N/A;   PORTACATH PLACEMENT Left 02/15/2019   Procedure: INSERTION PORT-A-CATH;  Surgeon: Kallie Manuelita BROCKS, MD;  [facility];  Service: General;  Laterality: Left;   TUBAL LIGATION  1992    Family History  Problem Relation Age of Onset   Hypertension Mother        AAA   Coronary artery disease Mother    Hyperlipidemia Mother    Cancer Mother  lung   Hypertension Father    Cancer Brother     Social History   Tobacco Use   Smoking status: Every Day    Current packs/day: 0.75    Average packs/day: 0.8 packs/day for 45.0 years (33.8 ttl pk-yrs)    Types: Cigarettes   Smokeless tobacco: Never  Vaping Use   Vaping status: Never Used  Substance Use Topics   Alcohol use: Yes    Alcohol/week: 0.0 standard drinks of alcohol    Comment: occasionally   Drug use: No    Medications: I have reviewed the patient's current medications. Allergies as of 06/15/2024   No Known Allergies      Medication List        Accurate as of June 15, 2024 11:21 AM. If you have any  questions, ask your nurse or doctor.          Accu-Chek Guide Me w/Device Kit USE TO CHECK GLUCOSE ONCE DAILY   amLODipine  5 MG tablet Commonly known as: NORVASC  Take 1 tablet (5 mg total) by mouth daily.   aspirin EC 81 MG tablet Take 1 tablet (81 mg total) by mouth every evening.   Calcium  Carbonate-Vitamin D  600-400 MG-UNIT tablet Take 1 tablet by mouth daily.   diphenhydrAMINE  25 MG tablet Commonly known as: SOMINEX Take 25 mg by mouth at bedtime.   glucose blood test strip Use as instructed once daily dx e11.9   Lancets 30G Misc Once daily testing dx e11.9   multivitamin with minerals tablet Take 1 tablet by mouth daily.   Na Sulfate-K Sulfate-Mg Sulfate concentrate 17.5-3.13-1.6 GM/177ML Soln Commonly known as: SUPREP Use as directed   rosuvastatin  10 MG tablet Commonly known as: CRESTOR  Take 1 tablet (10 mg total) by mouth daily.         ROS:  A comprehensive review of systems was negative except for: Musculoskeletal: positive for back pain  Blood pressure 94/62, pulse 82, temperature 98.8 F (37.1 C), temperature source Oral, resp. rate 16, height 5' 3 (1.6 m), weight 132 lb (59.9 kg), SpO2 94%.  Physical Exam GENERAL: Alert, cooperative, well developed, no acute distress HEENT: Normocephalic, normal oropharynx, moist mucous membranes CHEST: Clear to auscultation bilaterally, No wheezes, rhonchi, or crackles; left chest wall with healed port site CARDIOVASCULAR: Normal heart rate and rhythm ABDOMEN: Soft, non-tender, non-distended, without organomegaly, Normal bowel sounds EXTREMITIES: No cyanosis or edema NEUROLOGICAL: Cranial nerves grossly intact, Moves all extremities without gross motor or sensory deficit  Results: None   Assessment & Plan Port a Catheter in Place.  The patient has a history of colon cancer status post partial colectomy and treatment.  Five years post-colectomy.  - Schedule port removal for Monday. - Perform under local  anesthesia  - Discussed risk of bleeding, infection, incomplete removal, need for CXR after.       [name] 06/15/2024, 11:21 AM

## 2024-06-15 NOTE — H&P (Signed)
 [facility] History and Physical   Reason for Referral: Port in place, left subclavian  Referring Physician: Dr. Rogers    Chief Complaint   Follow-up        Andrea Simon is a 68 y.o. female.  HPI:    Discussed the use of AI scribe software for clinical note transcription with the patient, who gave verbal consent to proceed.   History of Present Illness Andrea Simon is a 68 year old female who presents for removal of a left chest port. She was referred by Dr. Katragadda for removal of the port.   She had a part of her colon removed in 2020 and underwent subsequent treatment. She reports that everything has been good since then, and she is now five years out from the procedure.   She has a port in her left chest that was used for treatment. The port has not caused any issues. She is tired of having to go get it flushed.    She is currently on baby aspirin, which she continues to take.           Past Medical History:  Diagnosis Date   Cancer (HCC) 11/2018    Phreesia 09/16/2020   Cigarette smoker     Hyperlipidemia     Hypertension     Insomnia due to anxiety and fear 01/27/2019   Multinodular goiter     Pre-diabetes     Prediabetes                 Past Surgical History:  Procedure Laterality Date   BIOPSY   12/07/2018    Procedure: BIOPSY;  Surgeon: Golda Claudis PENNER, MD;  [facility];  Service: Endoscopy;;  sigmoid colon   COLON RESECTION N/A 01/09/2019    Procedure: LAPAROSCOPIC RIGHT HEMICOLECTOMY;  Surgeon: Kallie Manuelita BROCKS, MD;  [facility];  Service: General;  Laterality: N/A;   COLON SURGERY N/A      Phreesia 09/16/2020   COLONOSCOPY N/A 12/07/2018    Procedure: COLONOSCOPY;  Surgeon: Golda Claudis PENNER, MD;  [facility];  Service: Endoscopy;  Laterality: N/A;  12:45   COLONOSCOPY WITH PROPOFOL  N/A 10/16/2020    Procedure: COLONOSCOPY WITH PROPOFOL ;  Surgeon: Golda Claudis PENNER, MD;  [facility];  Service: Endoscopy;  Laterality: N/A;  955   COLONOSCOPY WITH PROPOFOL  N/A 11/22/2023    Procedure: COLONOSCOPY WITH PROPOFOL ;  Surgeon: Cinderella Deatrice FALCON, MD;  [facility];  Service: Endoscopy;  Laterality: N/A;  9:45AM;ASA 1-2   DILATION AND CURETTAGE OF UTERUS   2006   FNA thyroid    2011 and 2019    benign   HYSTEROSCOPY WITH D & C N/A 03/04/2022    Procedure: DILATATION AND CURETTAGE /HYSTEROSCOPY;  Surgeon: Jayne Vonn DEL, MD;  [facility];  Service: Gynecology;  Laterality: N/A;   POLYPECTOMY   12/07/2018    Procedure: POLYPECTOMY;  Surgeon: Golda Claudis PENNER, MD;  [facility];  Service: Endoscopy;;  splenic flexure (CS x1)   POLYPECTOMY   10/16/2020    Procedure: POLYPECTOMY;  Surgeon: Golda Claudis PENNER, MD;  [facility];  Service: Endoscopy;;   POLYPECTOMY N/A 03/04/2022    Procedure: Removal of Endometrial Polyp;  Surgeon: Jayne Vonn DEL, MD;  [facility];  Service: Gynecology;  Laterality: N/A;   PORTACATH PLACEMENT Left 02/15/2019    Procedure: INSERTION PORT-A-CATH;  Surgeon: Kallie Manuelita BROCKS, MD;  [facility];  Service: General;  Laterality: Left;   TUBAL LIGATION   1992               Family History  Problem Relation Age of Onset   Hypertension Mother          AAA   Coronary artery disease Mother     Hyperlipidemia Mother     Cancer Mother          lung   Hypertension Father     Cancer Brother            Social History  Social History         Tobacco Use   Smoking status: Every Day      Current packs/day: 0.75      Average packs/day: 0.8 packs/day for 45.0 years (33.8 ttl pk-yrs)      Types: Cigarettes   Smokeless tobacco: Never  Vaping Use   Vaping status: Never Used  Substance Use Topics   Alcohol use: Yes      Alcohol/week: 0.0 standard drinks of alcohol      Comment: occasionally   Drug use: No        Medications: I have reviewed the patient's current medications. Allergies as of  06/15/2024   No Known Allergies         Medication List           Accurate as of June 15, 2024 11:21 AM. If you have any questions, ask your nurse or doctor.              Accu-Chek Guide Me w/Device Kit USE TO CHECK GLUCOSE ONCE DAILY    amLODipine  5 MG tablet Commonly known as: NORVASC  Take 1 tablet (5 mg total) by mouth daily.    aspirin EC 81 MG tablet Take 1 tablet (81 mg total) by mouth every evening.    Calcium  Carbonate-Vitamin D  600-400 MG-UNIT tablet Take 1 tablet by mouth daily.    diphenhydrAMINE  25 MG tablet Commonly known as: SOMINEX Take 25 mg by mouth at bedtime.    glucose blood test strip Use as instructed once daily dx e11.9    Lancets 30G Misc Once daily testing dx e11.9    multivitamin with minerals tablet Take 1 tablet by mouth daily.    Na Sulfate-K Sulfate-Mg Sulfate concentrate 17.5-3.13-1.6 GM/177ML Soln Commonly known as: SUPREP Use as directed    rosuvastatin  10 MG tablet Commonly known as: CRESTOR  Take 1 tablet (10 mg total) by mouth daily.               ROS:  A comprehensive review of systems was negative except for: Musculoskeletal: positive for back pain   Blood pressure 94/62, pulse 82, temperature 98.8 F (37.1 C), temperature source Oral, resp. rate 16, height 5' 3 (1.6 m), weight 132 lb (59.9 kg), SpO2 94%.   Physical Exam GENERAL: Alert, cooperative, well developed, no acute distress HEENT: Normocephalic, normal oropharynx, moist mucous membranes CHEST: Clear to auscultation bilaterally, No wheezes, rhonchi, or crackles; left chest wall with healed port site CARDIOVASCULAR: Normal heart rate and rhythm ABDOMEN: Soft, non-tender, non-distended, without organomegaly, Normal bowel sounds EXTREMITIES: No cyanosis or edema NEUROLOGICAL: Cranial nerves grossly intact, Moves all extremities without gross motor or sensory deficit   Results: None    Assessment & Plan Port a Catheter in Place.  The patient has a  history of colon cancer status post partial colectomy and treatment.  Five years post-colectomy.  - Schedule port removal  for Monday. - Perform under local anesthesia  - Discussed risk of bleeding, infection, incomplete removal, need for CXR after.            [name] 06/15/2024, 11:21 AM

## 2024-06-16 ENCOUNTER — Encounter: Payer: Self-pay | Admitting: Family Medicine

## 2024-06-16 ENCOUNTER — Ambulatory Visit (INDEPENDENT_AMBULATORY_CARE_PROVIDER_SITE_OTHER): Payer: Medicare (Managed Care) | Admitting: Family Medicine

## 2024-06-16 ENCOUNTER — Other Ambulatory Visit (HOSPITAL_COMMUNITY): Payer: Self-pay | Admitting: Family Medicine

## 2024-06-16 VITALS — BP 108/69 | HR 78 | Resp 16 | Ht 63.0 in | Wt 133.0 lb

## 2024-06-16 DIAGNOSIS — E119 Type 2 diabetes mellitus without complications: Secondary | ICD-10-CM

## 2024-06-16 DIAGNOSIS — I10 Essential (primary) hypertension: Secondary | ICD-10-CM | POA: Diagnosis not present

## 2024-06-16 DIAGNOSIS — Z0001 Encounter for general adult medical examination with abnormal findings: Secondary | ICD-10-CM | POA: Diagnosis not present

## 2024-06-16 DIAGNOSIS — Z0000 Encounter for general adult medical examination without abnormal findings: Secondary | ICD-10-CM

## 2024-06-16 DIAGNOSIS — Z1231 Encounter for screening mammogram for malignant neoplasm of breast: Secondary | ICD-10-CM

## 2024-06-16 NOTE — Progress Notes (Signed)
 Subjective:    Andrea Simon is a 68 y.o. female who presents for a Welcome to Medicare exam.          Objective:   BP 108/69   Pulse 78   Resp 16   Ht 5' 3 (1.6 m)   Wt 133 lb (60.3 kg)   SpO2 97%   BMI 23.56 kg/m   There were no vitals filed for this visit.There is no height or weight on file to calculate BMI.  Medications Outpatient Encounter Medications as of 06/16/2024  Medication Sig   amLODipine  (NORVASC ) 5 MG tablet Take 1 tablet (5 mg total) by mouth daily.   aspirin EC 81 MG tablet Take 1 tablet (81 mg total) by mouth every evening.   Blood Glucose Monitoring Suppl (ACCU-CHEK GUIDE ME) w/Device KIT USE TO CHECK GLUCOSE ONCE DAILY   Calcium  Carb-Cholecalciferol (CALCIUM  600 + D PO) Take 2 tablets by mouth daily.   diphenhydrAMINE  (SOMINEX) 25 MG tablet Take 25 mg by mouth at bedtime.   glucose blood test strip Use as instructed once daily dx e11.9   Lancets 30G MISC Once daily testing dx e11.9   naproxen sodium (ALEVE) 220 MG tablet Take 220 mg by mouth daily as needed (pain).   rosuvastatin  (CRESTOR ) 10 MG tablet Take 1 tablet (10 mg total) by mouth daily.   Facility-Administered Encounter Medications as of 06/16/2024  Medication   sodium chloride  flush (NS) 0.9 % injection 10 mL     History: Past Medical History:  Diagnosis Date   Cancer (HCC) 11/2018   Phreesia 09/16/2020   Cigarette smoker    Hyperlipidemia    Hypertension    Insomnia due to anxiety and fear 01/27/2019   Multinodular goiter    Pre-diabetes    Prediabetes    Past Surgical History:  Procedure Laterality Date   BIOPSY  12/07/2018   Procedure: BIOPSY;  Surgeon: Golda Claudis PENNER, MD;  [facility];  Service: Endoscopy;;  sigmoid colon   COLON RESECTION N/A 01/09/2019   Procedure: LAPAROSCOPIC RIGHT HEMICOLECTOMY;  Surgeon: Kallie Manuelita BROCKS, MD;  [facility];  Service: General;  Laterality: N/A;   COLON SURGERY N/A    Phreesia 09/16/2020   COLONOSCOPY N/A  12/07/2018   Procedure: COLONOSCOPY;  Surgeon: Golda Claudis PENNER, MD;  [facility];  Service: Endoscopy;  Laterality: N/A;  12:45   COLONOSCOPY WITH PROPOFOL  N/A 10/16/2020   Procedure: COLONOSCOPY WITH PROPOFOL ;  Surgeon: Golda Claudis PENNER, MD;  [facility];  Service: Endoscopy;  Laterality: N/A;  955   COLONOSCOPY WITH PROPOFOL  N/A 11/22/2023   Procedure: COLONOSCOPY WITH PROPOFOL ;  Surgeon: Cinderella Deatrice FALCON, MD;  [facility];  Service: Endoscopy;  Laterality: N/A;  9:45AM;ASA 1-2   DILATION AND CURETTAGE OF UTERUS  2006   FNA thyroid   2011 and 2019   benign   HYSTEROSCOPY WITH D & C N/A 03/04/2022   Procedure: DILATATION AND CURETTAGE /HYSTEROSCOPY;  Surgeon: Jayne Vonn DEL, MD;  [facility];  Service: Gynecology;  Laterality: N/A;   POLYPECTOMY  12/07/2018   Procedure: POLYPECTOMY;  Surgeon: Golda Claudis PENNER, MD;  [facility];  Service: Endoscopy;;  splenic flexure (CS x1)   POLYPECTOMY  10/16/2020   Procedure: POLYPECTOMY;  Surgeon: Golda Claudis PENNER, MD;  [facility];  Service: Endoscopy;;   POLYPECTOMY N/A 03/04/2022   Procedure: Removal of Endometrial Polyp;  Surgeon: Jayne Vonn DEL, MD;  [facility];  Service: Gynecology;  Laterality: N/A;   PORTACATH PLACEMENT Left 02/15/2019   Procedure: INSERTION PORT-A-CATH;  Surgeon: Kallie Manuelita BROCKS, MD;  [facility];  Service: General;  Laterality: Left;   TUBAL LIGATION  1992    Family History  Problem Relation Age of Onset   Hypertension Mother        AAA   Coronary artery disease Mother    Hyperlipidemia Mother    Cancer Mother        lung   Hypertension Father    Cancer Brother    Social History   Occupational History   Occupation: Advertising copywriter - [employer]   Tobacco Use   Smoking status: Every Day    Current packs/day: 0.75    Average packs/day: 0.8 packs/day for 45.0 years (33.8 ttl pk-yrs)    Types: Cigarettes   Smokeless tobacco: Never  Vaping Use    Vaping status: Never Used  Substance and Sexual Activity   Alcohol use: Yes    Alcohol/week: 0.0 standard drinks of alcohol    Comment: occasionally   Drug use: No   Sexual activity: Yes    Birth control/protection: None, Surgical    Tobacco Counseling Ready to quit: Not Answered Counseling given: Not Answered   Immunizations and Health Maintenance Immunization History  Administered Date(s) Administered   Fluad Quad(high Dose 65+) 11/17/2022   Fluad Trivalent(High Dose 65+) 11/23/2023   Influenza,inj,Quad PF,6+ Mos 09/10/2015, 08/18/2016, 09/21/2019, 09/19/2020, 10/17/2021   PFIZER Comirnaty(Gray Top)Covid-19 Tri-Sucrose Vaccine 05/21/2021   PFIZER(Purple Top)SARS-COV-2 Vaccination 03/09/2020, 04/02/2020, 10/30/2020   Pneumococcal Conjugate-13 02/04/2017   Pneumococcal Polysaccharide-23 10/16/2019   Td 09/25/2010, 07/14/2021   Zoster Recombinant(Shingrix ) 06/01/2023   Health Maintenance Due  Topic Date Due   Medicare Annual Wellness (AWV)  Never done    Activities of Daily Living     No data to display          Physical Exam   Physical Exam (optional), or other factors deemed appropriate based on the beneficiary's medical and social history and current clinical standards.   Advanced Directives:Discussed and written information provided. Patient non commital top following up on this currently but open to further discussion    EKG:  Needs to return to have this done, technical difficulty      Assessment:    This is a routine wellness examination for this patient . Andrea Simon  Vision/Hearing screen No results found.   Goals   None     Depression Screen    04/25/2024    4:03 PM 11/23/2023    4:08 PM 06/01/2023    3:04 PM 11/17/2022   11:18 AM  PHQ 2/9 Scores  PHQ - 2 Score 0 0 0 0     Fall Risk    04/25/2024    4:03 PM  Fall Risk   Falls in the past year? 0  Number falls in past yr: 0  Injury with Fall? 0  Follow up Falls evaluation  completed    Cognitive Function:        Patient Care Team: Antonetta Rollene [place], MD as PCP - General (Family Medicine)     Plan:   Reduce smoking More regula exercise with a target of 30 mins 5 days per week Advanced directives to  be worked on would like a quit date to occur, continue to encourage pt, excelling in all areas of her health which is great  I have personally reviewed and noted the following in the patient's chart:  Opioid use: none prescribed Medical and social history Use of alcohol, tobacco or illicit drugs  Current medications and supplements including opioid prescriptions.  Functional ability and status Nutritional status Physical activity Advanced directives List of other physicians Hospitalizations, surgeries, and ER visits in previous 12 months Vitals Screenings to include cognitive, depression, and falls Referrals and appointments  In addition, I have reviewed and discussed with patient certain preventive protocols, quality metrics, and best practice recommendations. A written personalized care plan for preventive services as well as general preventive health recommendations were provided to patient.     Rollene Pesa, MD 06/16/2024

## 2024-06-16 NOTE — Patient Instructions (Signed)
 F/U in 6 months, call if you need me sooner  Please schedule mammogram at checkout  Return 6/30 for EKG please arrange with Nurse  Nurse please provide paperwork on advanced directives while checking out patient   Excellent labs  Work on your  one little problem  It is important that you exercise regularly at least 30 minutes 5 times a week. If you develop chest pain, have severe difficulty breathing, or feel very tired, stop exercising immediately and seek medical attention     Continue to be content and enjoy each moment   Non fasting HBA1C, cmp and EGFR 1 week before  next visit  Thanks for choosing [facility], we consider it a privelige to serve you.

## 2024-06-19 ENCOUNTER — Ambulatory Visit (HOSPITAL_COMMUNITY)
Admission: RE | Admit: 2024-06-19 | Discharge: 2024-06-19 | Disposition: A | Discharge status: Home / Self Care | Payer: Medicare (Managed Care) | Attending: General Surgery | Admitting: General Surgery

## 2024-06-19 ENCOUNTER — Ambulatory Visit (HOSPITAL_COMMUNITY): Payer: Medicare (Managed Care)

## 2024-06-19 ENCOUNTER — Encounter (HOSPITAL_COMMUNITY)
Admission: RE | Disposition: A | Discharge status: Home / Self Care | Payer: Self-pay | Source: Home / Self Care | Attending: General Surgery

## 2024-06-19 DIAGNOSIS — Z7982 Long term (current) use of aspirin: Secondary | ICD-10-CM | POA: Insufficient documentation

## 2024-06-19 DIAGNOSIS — I10 Essential (primary) hypertension: Secondary | ICD-10-CM | POA: Insufficient documentation

## 2024-06-19 DIAGNOSIS — Z95828 Presence of other vascular implants and grafts: Secondary | ICD-10-CM

## 2024-06-19 DIAGNOSIS — F17210 Nicotine dependence, cigarettes, uncomplicated: Secondary | ICD-10-CM | POA: Insufficient documentation

## 2024-06-19 DIAGNOSIS — Z9049 Acquired absence of other specified parts of digestive tract: Secondary | ICD-10-CM | POA: Insufficient documentation

## 2024-06-19 DIAGNOSIS — Z85038 Personal history of other malignant neoplasm of large intestine: Secondary | ICD-10-CM | POA: Diagnosis not present

## 2024-06-19 DIAGNOSIS — E785 Hyperlipidemia, unspecified: Secondary | ICD-10-CM | POA: Insufficient documentation

## 2024-06-19 DIAGNOSIS — Z452 Encounter for adjustment and management of vascular access device: Secondary | ICD-10-CM | POA: Insufficient documentation

## 2024-06-19 HISTORY — PX: PORT-A-CATH REMOVAL: SHX5289

## 2024-06-19 SURGERY — REMOVAL PORT-A-CATH
Anesthesia: LOCAL

## 2024-06-19 MED ORDER — LIDOCAINE HCL (PF) 1 % IJ SOLN
INTRAMUSCULAR | Status: DC | PRN
Start: 2024-06-19 — End: 2024-06-19
  Administered 2024-06-19: 10 mL

## 2024-06-19 MED ORDER — CHLORHEXIDINE GLUCONATE CLOTH 2 % EX PADS: 6.0000 | MEDICATED_PAD | Freq: Once | CUTANEOUS | Status: DC

## 2024-06-19 MED ORDER — LIDOCAINE HCL (PF) 1 % IJ SOLN
INTRAMUSCULAR | Status: AC
  Filled 2024-06-19: qty 30

## 2024-06-19 SURGICAL SUPPLY — 21 items
APPLICATOR CHLORAPREP 10.5 ORG (MISCELLANEOUS) ×1 IMPLANT
CLOTH BEACON ORANGE TIMEOUT ST (SAFETY) ×1 IMPLANT
COVER LIGHT HANDLE (MISCELLANEOUS) IMPLANT
COVER SURGICAL LIGHT HANDLE (MISCELLANEOUS) ×2 IMPLANT
DECANTER SPIKE VIAL GLASS SM (MISCELLANEOUS) ×1 IMPLANT
DERMABOND ADVANCED .7 DNX12 (GAUZE/BANDAGES/DRESSINGS) ×1 IMPLANT
ELECTRODE REM PT RTRN 9FT ADLT (ELECTROSURGICAL) ×1 IMPLANT
GLOVE BIO SURGEON STRL SZ 6.5 (GLOVE) ×1 IMPLANT
GLOVE BIOGEL PI IND STRL 6.5 (GLOVE) ×1 IMPLANT
GLOVE BIOGEL PI IND STRL 7.0 (GLOVE) ×2 IMPLANT
GOWN STRL REUS W/TWL LRG LVL3 (GOWN DISPOSABLE) ×2 IMPLANT
KIT TURNOVER KIT A (KITS) ×1 IMPLANT
NDL HYPO 21X1.5 SAFETY (NEEDLE) ×1 IMPLANT
NEEDLE HYPO 21X1.5 SAFETY (NEEDLE) ×1 IMPLANT
PACK MINOR (CUSTOM PROCEDURE TRAY) ×1 IMPLANT
PAD ARMBOARD POSITIONER FOAM (MISCELLANEOUS) ×1 IMPLANT
POSITIONER HEAD 8X9X4 ADT (SOFTGOODS) ×1 IMPLANT
SET BASIN LINEN APH (SET/KITS/TRAYS/PACK) ×1 IMPLANT
SUT MNCRL AB 4-0 PS2 18 (SUTURE) ×1 IMPLANT
SUT VIC AB 3-0 SH 27X BRD (SUTURE) ×1 IMPLANT
SYR 30ML LL (SYRINGE) ×1 IMPLANT

## 2024-06-19 NOTE — Interval H&P Note (Signed)
 History and Physical Interval Note:  06/19/2024 9:38 AM  Andrea Simon  has presented today for surgery, with the diagnosis of PORT- A- CATH IN PLACE MALIGNANT NEOPLASM TRANSVERSE COLON.  The various methods of treatment have been discussed with the patient and family. After consideration of risks, benefits and other options for treatment, the patient has consented to  Procedure(s) with comments: REMOVAL PORT-A-CATH (N/A) - MINOR PROCEDURE ROOM as a surgical intervention.  [name] history has been reviewed, patient examined, no change in status, stable for surgery.  I have reviewed the patient's chart and labs.  Questions were answered to the patient's satisfaction.     [name]

## 2024-06-19 NOTE — Progress Notes (Signed)
 [facility]  Patient CXR without obvious ptx or other pathology. Port removed completely.   Manuelita Pander, MD [facility] [address] [place], [place] [address] [phone] (office)

## 2024-06-19 NOTE — Op Note (Signed)
 [facility] Procedure Note  06/19/24  Pre-procedure Diagnosis: Port in place left subclavian    Post-procedure Diagnosis: Same   Procedure(s) Performed: Removal of port a catheter    Surgeon: Manuelita BROCKS. Kallie, MD   Assistants: No qualified resident was available    Anesthesia: Lidocaine  1%    Specimens: None    Estimated Blood Loss: Minimal  Wound Class: Clean    Procedure Indications: Andrea Simon is a 68 yo who has a port in place that needs removing after her treatment.  We discussed removal under local and risk of bleeding, infection, pneumothorax, incomplete removal.   Findings: Normal port with complete removal    Procedure: The patient was taken to the procedure room and placed semi upright. The left chest and neck were prepared and draped in the usual sterile fashion. Lidocaine  1% was injected.   An incision was made over the port and carried down to the port cavity. With sharp dissection the port was removed from the cavity and the sutures were removed.  The catheter and port were removed in their entirety. My assistant held pressure on the subclavian vein at the entry site under the clavicle for 10 minutes.   The cavity was closed with deep 3-0 Vicryl suture. The skin was closed with 4-0 Monocryl subcuticular and dermabond.   Final inspection revealed acceptable hemostasis. The patient tolerated the procedure well.   CXR was performed.   Manuelita Kallie, MD [facility] [address] [place], [place] [address] [phone] (office)

## 2024-06-19 NOTE — Discharge Instructions (Addendum)
 Discharge Instructions: Keep area clean and dry. You can take a shower in 24 hours.  Do not submerge in water  for 3-4 weeks until the incision completely healed.  Take tylenol  and ibuprofen  for pain control. Call if you think you need something stronger.    Contact Information: If you have questions or concerns, please call our office, [phone], Monday- Thursday 8AM-5PM and Friday 8AM-12Noon.  If it is after hours or on the weekend, please call [facility] Main [facility], [phone], [phone] and ask to speak to the surgeon on call for Dr. Kallie at [facility].

## 2024-06-19 NOTE — Interval H&P Note (Signed)
 History and Physical Interval Note:  06/19/2024 9:31 AM  Andrea Simon  has presented today for surgery, with the diagnosis of PORT- A- CATH IN PLACE MALIGNANT NEOPLASM TRANSVERSE COLON.  The various methods of treatment have been discussed with the patient and family. After consideration of risks, benefits and other options for treatment, the patient has consented to  Procedure(s) with comments: REMOVAL PORT-A-CATH (N/A) - MINOR PROCEDURE ROOM as a surgical intervention.  [name] history has been reviewed, patient examined, no change in status, stable for surgery.  I have reviewed the patient's chart and labs.  Questions were answered to the patient's satisfaction.     [name]

## 2024-06-20 ENCOUNTER — Encounter (HOSPITAL_COMMUNITY): Payer: Self-pay | Admitting: General Surgery

## 2024-09-23 ENCOUNTER — Other Ambulatory Visit: Payer: Self-pay | Admitting: Family Medicine

## 2024-09-23 DIAGNOSIS — E782 Mixed hyperlipidemia: Secondary | ICD-10-CM

## 2024-10-13 ENCOUNTER — Other Ambulatory Visit: Payer: Self-pay | Admitting: Family Medicine

## 2024-10-13 DIAGNOSIS — I10 Essential (primary) hypertension: Secondary | ICD-10-CM

## 2024-11-20 ENCOUNTER — Ambulatory Visit (HOSPITAL_COMMUNITY): Payer: Medicare (Managed Care)

## 2024-11-20 ENCOUNTER — Ambulatory Visit (HOSPITAL_COMMUNITY)
Admission: RE | Admit: 2024-11-20 | Discharge: 2024-11-20 | Disposition: A | Payer: Medicare (Managed Care) | Source: Ambulatory Visit | Attending: Family Medicine | Admitting: Family Medicine

## 2024-11-20 DIAGNOSIS — Z1231 Encounter for screening mammogram for malignant neoplasm of breast: Secondary | ICD-10-CM | POA: Diagnosis not present

## 2024-12-19 ENCOUNTER — Ambulatory Visit: Payer: Self-pay | Admitting: Family Medicine

## 2024-12-19 LAB — CMP14+EGFR
ALT: 26 IU/L (ref 0–32)
AST: 30 IU/L (ref 0–40)
Albumin: 4.7 g/dL (ref 3.9–4.9)
Alkaline Phosphatase: 81 IU/L (ref 49–135)
BUN/Creatinine Ratio: 8 — ABNORMAL LOW (ref 12–28)
BUN: 7 mg/dL — ABNORMAL LOW (ref 8–27)
Bilirubin Total: 0.8 mg/dL (ref 0.0–1.2)
CO2: 23 mmol/L (ref 20–29)
Calcium: 9.7 mg/dL (ref 8.7–10.3)
Chloride: 104 mmol/L (ref 96–106)
Creatinine, Ser: 0.86 mg/dL (ref 0.57–1.00)
Globulin, Total: 2.6 g/dL (ref 1.5–4.5)
Glucose: 111 mg/dL — ABNORMAL HIGH (ref 70–99)
Potassium: 4.1 mmol/L (ref 3.5–5.2)
Sodium: 142 mmol/L (ref 134–144)
Total Protein: 7.3 g/dL (ref 6.0–8.5)
eGFR: 74 mL/min/1.73

## 2024-12-19 LAB — HEMOGLOBIN A1C
Est. average glucose Bld gHb Est-mCnc: 140 mg/dL
Hgb A1c MFr Bld: 6.5 % — ABNORMAL HIGH (ref 4.8–5.6)

## 2024-12-22 ENCOUNTER — Ambulatory Visit (INDEPENDENT_AMBULATORY_CARE_PROVIDER_SITE_OTHER): Payer: Medicare (Managed Care) | Admitting: Family Medicine

## 2024-12-22 ENCOUNTER — Encounter: Payer: Self-pay | Admitting: Family Medicine

## 2024-12-22 VITALS — BP 160/82 | HR 84 | Resp 16 | Ht 63.0 in | Wt 138.0 lb

## 2024-12-22 DIAGNOSIS — E785 Hyperlipidemia, unspecified: Secondary | ICD-10-CM

## 2024-12-22 DIAGNOSIS — F17200 Nicotine dependence, unspecified, uncomplicated: Secondary | ICD-10-CM

## 2024-12-22 DIAGNOSIS — E1159 Type 2 diabetes mellitus with other circulatory complications: Secondary | ICD-10-CM | POA: Diagnosis not present

## 2024-12-22 DIAGNOSIS — E119 Type 2 diabetes mellitus without complications: Secondary | ICD-10-CM

## 2024-12-22 DIAGNOSIS — E782 Mixed hyperlipidemia: Secondary | ICD-10-CM

## 2024-12-22 DIAGNOSIS — Z0001 Encounter for general adult medical examination with abnormal findings: Secondary | ICD-10-CM | POA: Diagnosis not present

## 2024-12-22 DIAGNOSIS — Z23 Encounter for immunization: Secondary | ICD-10-CM

## 2024-12-22 DIAGNOSIS — Z0000 Encounter for general adult medical examination without abnormal findings: Secondary | ICD-10-CM

## 2024-12-22 NOTE — Progress Notes (Signed)
 "   Andrea Simon     MRN: [id number]      DOB: [birth year]  Chief Complaint  Patient presents with   Annual Exam    HPI: Patient is in for annual physical exam. No other health concerns are expressed or addressed at the visit. Recent labs,  are reviewed. Immunization is reviewed , and  updated.   PE: BP (!) 160/82   Pulse 84   Resp 16   Ht 5' 3 (1.6 m)   Wt 138 lb 0.6 oz (62.6 kg)   SpO2 97%   BMI 24.45 kg/m   Pleasant  female, alert and oriented x 3, in no cardio-pulmonary distress. Afebrile. HEENT No facial trauma or asymetry. Sinuses non tender.  Extra occullar muscles intact.. External ears normal, . Neck: supple, no adenopathy,JVD or thyromegaly.No bruits.  Chest: Clear to ascultation bilaterally.No crackles or wheezes. Non tender to palpation   Cardiovascular system; Heart sounds normal,  S1 and  S2 ,no S3.  No murmur, or thrill. Apical beat not displaced Peripheral pulses normal.  Abdomen: Soft, non tender, no organomegaly or mass  Musculoskeletal exam: Full ROM of spine, hips , shoulders and knees. No deformity ,swelling or crepitus noted. No muscle wasting or atrophy.   Neurologic: Cranial nerves 2 to 12 intact. Power, tone ,sensationnormal throughout. No disturbance in gait. No tremor.  Skin: Intact, no ulceration, erythema , scaling or rash noted. Pigmentation normal throughout  Psych; Normal mood and affect. Judgement and concentration normal   Assessment & Plan:  Annual physical exam Annual exam as documented. Counseling done  re healthy lifestyle involving commitment to 150 minutes exercise per week, heart healthy diet, and attaining healthy weight.The importance of adequate sleep also discussed.  Changes in health habits are decided on by the patient with goals and time frames  set for achieving them. Immunization and cancer screening needs are specifically addressed at this visit.   Hyperlipidemia LDL goal  <100 Hyperlipidemia:Low fat diet discussed and encouraged.   Lipid Panel  Lab Results  Component Value Date   CHOL 155 06/09/2024   HDL 48 06/09/2024   LDLCALC 85 06/09/2024   TRIG 125 06/09/2024   CHOLHDL 3.2 06/09/2024     Updated lab needed .   NICOTINE ADDICTION Asked:confirms currently smokes 12 to 15  cigarettes per day Assess: Unwilling to set a quit date, not committing to cutting back at this time Advise: needs to QUIT to reduce risk of cancer, cardio and cerebrovascular disease Assist: counseled for 5 minutes and literature provided Arrange: follow up in 2 to 4 months   Type 2 diabetes, diet controlled (HCC) Diabetes associated with hypertension and hyperlipidemia  [name] is reminded of the importance of commitment to daily physical activity for 30 minutes or more, as able and the need to limit carbohydrate intake to 30 to 60 grams per meal to help with blood sugar control.    [name] is reminded of the importance of daily foot exam, annual eye examination, and good blood sugar, blood pressure and cholesterol control.     Latest Ref Rng & Units 12/18/2024   10:44 AM 06/09/2024   10:01 AM 05/16/2024   11:13 AM 12/03/2023    2:33 PM 11/04/2023    2:17 PM  Diabetic Labs  HbA1c 4.8 - 5.6 % 6.5  6.1   6.2    Micro/Creat Ratio 0 - 29 mg/g creat  5      Chol 100 - 199 mg/dL  844  HDL >39 mg/dL  48      Calc LDL 0 - 99 mg/dL  85      Triglycerides 0 - 149 mg/dL  874      Creatinine 9.42 - 1.00 mg/dL 9.13  9.11  9.20   9.14       12/22/2024    2:03 PM 12/22/2024    1:33 PM 06/19/2024    9:42 AM 06/16/2024    1:50 PM 06/15/2024   11:10 AM 05/25/2024   11:42 AM 04/25/2024    4:02 PM  BP/Weight  Systolic BP 160 170 129 108 94 110 124  Diastolic BP 82 84 83 69 62 78 82  Wt. (Lbs)  138.04  133 132  130  BMI  24.45 kg/m2  23.56 kg/m2 23.38 kg/m2  23.03 kg/m2      11/15/2023    2:06 PM 11/17/2022   11:00 AM  Foot/eye exam completion dates  Eye Exam --        Foot Form Completion  Done     This result is from an external source.      Deteriorated Updated lab needed at/ before next visit.   Immunization due After obtaining informed consent, the influenza  vaccine is  administered , with no adverse effect noted at the time of administration.   "

## 2024-12-22 NOTE — Patient Instructions (Addendum)
 F/U in 4 months  HBA1C   Flu vaccine today  Covid vaccine at pharmacy in next 1 to 2 weeks  Nurse BP check and pneumonia vaccine in 4 weeks  Decrease/ stop chips , chocolate and sweet tea, blood pressure and blood sugar are up aat this visit  Nurse pls add lipid panel  Consider reducing cigarettes by 2 per month

## 2024-12-23 DIAGNOSIS — Z23 Encounter for immunization: Secondary | ICD-10-CM | POA: Insufficient documentation

## 2024-12-23 NOTE — Assessment & Plan Note (Signed)
Annual exam as documented. Counseling done  re healthy lifestyle involving commitment to 150 minutes exercise per week, heart healthy diet, and attaining healthy weight.The importance of adequate sleep also discussed. Changes in health habits are decided on by the patient with goals and time frames  set for achieving them. Immunization and cancer screening needs are specifically addressed at this visit. 

## 2024-12-23 NOTE — Assessment & Plan Note (Signed)
 Hyperlipidemia:Low fat diet discussed and encouraged.   Lipid Panel  Lab Results  Component Value Date   CHOL 155 06/09/2024   HDL 48 06/09/2024   LDLCALC 85 06/09/2024   TRIG 125 06/09/2024   CHOLHDL 3.2 06/09/2024     Updated lab needed .

## 2024-12-23 NOTE — Assessment & Plan Note (Signed)
 Diabetes associated with hypertension and hyperlipidemia  Andrea Simon is reminded of the importance of commitment to daily physical activity for 30 minutes or more, as able and the need to limit carbohydrate intake to 30 to 60 grams per meal to help with blood sugar control.    Andrea Simon is reminded of the importance of daily foot exam, annual eye examination, and good blood sugar, blood pressure and cholesterol control.     Latest Ref Rng & Units 12/18/2024   10:44 AM 06/09/2024   10:01 AM 05/16/2024   11:13 AM 12/03/2023    2:33 PM 11/04/2023    2:17 PM  Diabetic Labs  HbA1c 4.8 - 5.6 % 6.5  6.1   6.2    Micro/Creat Ratio 0 - 29 mg/g creat  5      Chol 100 - 199 mg/dL  844      HDL >60 mg/dL  48      Calc LDL 0 - 99 mg/dL  85      Triglycerides 0 - 149 mg/dL  874      Creatinine 9.42 - 1.00 mg/dL 9.13  9.11  9.20   9.14       12/22/2024    2:03 PM 12/22/2024    1:33 PM 06/19/2024    9:42 AM 06/16/2024    1:50 PM 06/15/2024   11:10 AM 05/25/2024   11:42 AM 04/25/2024    4:02 PM  BP/Weight  Systolic BP 160 170 129 108 94 110 124  Diastolic BP 82 84 83 69 62 78 82  Wt. (Lbs)  138.04  133 132  130  BMI  24.45 kg/m2  23.56 kg/m2 23.38 kg/m2  23.03 kg/m2      11/15/2023    2:06 PM 11/17/2022   11:00 AM  Foot/eye exam completion dates  Eye Exam --       Foot Form Completion  Done     This result is from an external source.      Deteriorated Updated lab needed at/ before next visit.

## 2024-12-23 NOTE — Assessment & Plan Note (Signed)
 Asked:confirms currently smokes 12 to 15  cigarettes per day Assess: Unwilling to set a quit date, not committing to cutting back at this time Advise: needs to QUIT to reduce risk of cancer, cardio and cerebrovascular disease Assist: counseled for 5 minutes and literature provided Arrange: follow up in 2 to 4 months

## 2024-12-23 NOTE — Assessment & Plan Note (Signed)
 After obtaining informed consent, the influenza vaccine is  administered , with no adverse effect noted at the time of administration.

## 2024-12-27 ENCOUNTER — Ambulatory Visit: Payer: Self-pay | Admitting: Family Medicine

## 2024-12-27 ENCOUNTER — Other Ambulatory Visit: Payer: Self-pay

## 2024-12-27 DIAGNOSIS — I10 Essential (primary) hypertension: Secondary | ICD-10-CM

## 2024-12-27 DIAGNOSIS — E785 Hyperlipidemia, unspecified: Secondary | ICD-10-CM

## 2024-12-27 DIAGNOSIS — E559 Vitamin D deficiency, unspecified: Secondary | ICD-10-CM

## 2024-12-27 DIAGNOSIS — E119 Type 2 diabetes mellitus without complications: Secondary | ICD-10-CM

## 2024-12-27 LAB — SPECIMEN STATUS REPORT

## 2025-01-12 ENCOUNTER — Ambulatory Visit (HOSPITAL_COMMUNITY): Admission: RE | Admit: 2025-01-12 | Payer: Medicare (Managed Care) | Source: Ambulatory Visit

## 2025-01-19 ENCOUNTER — Ambulatory Visit (INDEPENDENT_AMBULATORY_CARE_PROVIDER_SITE_OTHER): Payer: Self-pay

## 2025-01-19 DIAGNOSIS — Z23 Encounter for immunization: Secondary | ICD-10-CM

## 2025-01-19 NOTE — Progress Notes (Signed)
 Patient is in office today for a nurse visit for Immunization and bp check Patient Injection was given in the  Right arm. Patient tolerated injection well. Bp was 118/70.

## 2025-01-19 NOTE — Addendum Note (Signed)
 Addended by: Mayo Owczarzak on: 01/19/2025 01:05 PM   Modules accepted: Orders

## 2025-05-04 ENCOUNTER — Ambulatory Visit: Payer: Self-pay | Admitting: Family Medicine

## 2025-05-17 ENCOUNTER — Other Ambulatory Visit: Payer: Medicare (Managed Care)

## 2025-05-24 ENCOUNTER — Ambulatory Visit: Payer: Medicare (Managed Care) | Admitting: Oncology

## 2025-06-19 ENCOUNTER — Ambulatory Visit: Payer: Medicare (Managed Care)
